# Patient Record
Sex: Male | Born: 1935 | Race: White | Hispanic: No | Marital: Married | Smoking: Never smoker
Health system: Southern US, Community
[De-identification: ages and names within clinical notes are randomized; demographics above are authoritative.]

## PROBLEM LIST (undated history)

## (undated) DIAGNOSIS — G4700 Insomnia, unspecified: Secondary | ICD-10-CM

## (undated) DIAGNOSIS — R55 Syncope and collapse: Secondary | ICD-10-CM

## (undated) DIAGNOSIS — I10 Essential (primary) hypertension: Secondary | ICD-10-CM

## (undated) DIAGNOSIS — I219 Acute myocardial infarction, unspecified: Secondary | ICD-10-CM

## (undated) DIAGNOSIS — R5383 Other fatigue: Secondary | ICD-10-CM

## (undated) DIAGNOSIS — F419 Anxiety disorder, unspecified: Secondary | ICD-10-CM

## (undated) DIAGNOSIS — G459 Transient cerebral ischemic attack, unspecified: Secondary | ICD-10-CM

## (undated) DIAGNOSIS — E871 Hypo-osmolality and hyponatremia: Secondary | ICD-10-CM

## (undated) DIAGNOSIS — I2510 Atherosclerotic heart disease of native coronary artery without angina pectoris: Secondary | ICD-10-CM

## (undated) DIAGNOSIS — R569 Unspecified convulsions: Secondary | ICD-10-CM

## (undated) DIAGNOSIS — I701 Atherosclerosis of renal artery: Secondary | ICD-10-CM

## (undated) DIAGNOSIS — I5020 Unspecified systolic (congestive) heart failure: Secondary | ICD-10-CM

## (undated) DIAGNOSIS — E785 Hyperlipidemia, unspecified: Secondary | ICD-10-CM

## (undated) DIAGNOSIS — Z9289 Personal history of other medical treatment: Secondary | ICD-10-CM

## (undated) HISTORY — DX: Hypo-osmolality and hyponatremia: E87.1

## (undated) HISTORY — DX: Hyperlipidemia, unspecified: E78.5

## (undated) HISTORY — DX: Atherosclerosis of renal artery: I70.1

## (undated) HISTORY — DX: Other fatigue: R53.83

## (undated) HISTORY — DX: Insomnia, unspecified: G47.00

## (undated) HISTORY — PX: CORONARY ARTERY BYPASS GRAFT: SHX141

## (undated) HISTORY — DX: Anxiety disorder, unspecified: F41.9

## (undated) HISTORY — DX: Personal history of other medical treatment: Z92.89

## (undated) HISTORY — DX: Acute myocardial infarction, unspecified: I21.9

## (undated) HISTORY — DX: Atherosclerotic heart disease of native coronary artery without angina pectoris: I25.10

## (undated) HISTORY — PX: POLYPECTOMY: SHX149

---

## 2001-12-20 DIAGNOSIS — I219 Acute myocardial infarction, unspecified: Secondary | ICD-10-CM

## 2001-12-20 HISTORY — PX: CARDIAC CATHETERIZATION: SHX172

## 2001-12-20 HISTORY — DX: Acute myocardial infarction, unspecified: I21.9

## 2002-04-25 ENCOUNTER — Encounter: Payer: Self-pay | Admitting: Emergency Medicine

## 2002-04-25 ENCOUNTER — Inpatient Hospital Stay (HOSPITAL_COMMUNITY): Admission: EM | Admit: 2002-04-25 | Discharge: 2002-04-28 | Payer: Self-pay | Admitting: Emergency Medicine

## 2002-04-26 ENCOUNTER — Encounter: Payer: Self-pay | Admitting: Internal Medicine

## 2002-04-27 ENCOUNTER — Encounter: Payer: Self-pay | Admitting: Internal Medicine

## 2002-05-16 ENCOUNTER — Encounter: Payer: Self-pay | Admitting: Cardiothoracic Surgery

## 2002-05-18 ENCOUNTER — Inpatient Hospital Stay (HOSPITAL_COMMUNITY): Admission: RE | Admit: 2002-05-18 | Discharge: 2002-05-23 | Payer: Self-pay | Admitting: Cardiothoracic Surgery

## 2002-05-18 ENCOUNTER — Encounter: Payer: Self-pay | Admitting: Cardiothoracic Surgery

## 2002-05-19 ENCOUNTER — Encounter: Payer: Self-pay | Admitting: Cardiothoracic Surgery

## 2002-05-20 ENCOUNTER — Encounter: Payer: Self-pay | Admitting: Thoracic Surgery (Cardiothoracic Vascular Surgery)

## 2002-05-20 ENCOUNTER — Encounter: Payer: Self-pay | Admitting: Cardiothoracic Surgery

## 2002-06-05 ENCOUNTER — Encounter (HOSPITAL_COMMUNITY): Admission: RE | Admit: 2002-06-05 | Discharge: 2002-09-03 | Payer: Self-pay | Admitting: Cardiology

## 2002-09-04 ENCOUNTER — Encounter (HOSPITAL_COMMUNITY): Admission: RE | Admit: 2002-09-04 | Discharge: 2002-12-03 | Payer: Self-pay | Admitting: Cardiology

## 2005-05-13 ENCOUNTER — Ambulatory Visit: Payer: Self-pay | Admitting: Internal Medicine

## 2006-04-11 ENCOUNTER — Ambulatory Visit: Payer: Self-pay | Admitting: Internal Medicine

## 2006-04-19 ENCOUNTER — Ambulatory Visit: Payer: Self-pay | Admitting: Internal Medicine

## 2006-11-05 ENCOUNTER — Ambulatory Visit: Payer: Self-pay | Admitting: Internal Medicine

## 2006-11-05 ENCOUNTER — Inpatient Hospital Stay (HOSPITAL_COMMUNITY): Admission: EM | Admit: 2006-11-05 | Discharge: 2006-11-07 | Payer: Self-pay | Admitting: Emergency Medicine

## 2006-11-11 ENCOUNTER — Ambulatory Visit: Payer: Self-pay | Admitting: Internal Medicine

## 2007-06-02 ENCOUNTER — Ambulatory Visit: Payer: Self-pay | Admitting: Internal Medicine

## 2007-06-02 DIAGNOSIS — F411 Generalized anxiety disorder: Secondary | ICD-10-CM | POA: Insufficient documentation

## 2007-06-02 DIAGNOSIS — E785 Hyperlipidemia, unspecified: Secondary | ICD-10-CM | POA: Insufficient documentation

## 2007-06-02 DIAGNOSIS — R5383 Other fatigue: Secondary | ICD-10-CM

## 2007-06-02 DIAGNOSIS — I2510 Atherosclerotic heart disease of native coronary artery without angina pectoris: Secondary | ICD-10-CM

## 2007-06-02 DIAGNOSIS — G4700 Insomnia, unspecified: Secondary | ICD-10-CM

## 2007-06-02 DIAGNOSIS — R5381 Other malaise: Secondary | ICD-10-CM

## 2007-06-05 LAB — CONVERTED CEMR LAB
BUN: 25 mg/dL — ABNORMAL HIGH (ref 6–23)
CO2: 22 meq/L (ref 19–32)
Creatinine, Ser: 1.12 mg/dL (ref 0.40–1.50)
Eosinophils Absolute: 0.3 10*3/uL (ref 0.0–0.7)
Eosinophils Relative: 6 % — ABNORMAL HIGH (ref 0–5)
Folate: 16.8 ng/mL
Glucose, Bld: 93 mg/dL (ref 70–99)
MCV: 93.3 fL (ref 78.0–100.0)
Neutro Abs: 3.7 10*3/uL (ref 1.7–7.7)
Platelets: 128 10*3/uL — ABNORMAL LOW (ref 150–400)
RBC: 4.91 M/uL (ref 4.22–5.81)
RDW: 14 % (ref 11.5–14.0)
Vitamin B-12: 348 pg/mL (ref 211–911)
WBC: 5.7 10*3/uL (ref 4.0–10.5)

## 2008-01-19 ENCOUNTER — Emergency Department (HOSPITAL_COMMUNITY): Admission: EM | Admit: 2008-01-19 | Discharge: 2008-01-19 | Payer: Self-pay | Admitting: Emergency Medicine

## 2008-01-19 ENCOUNTER — Encounter: Payer: Self-pay | Admitting: Internal Medicine

## 2008-01-22 ENCOUNTER — Telehealth (INDEPENDENT_AMBULATORY_CARE_PROVIDER_SITE_OTHER): Payer: Self-pay | Admitting: *Deleted

## 2008-01-23 ENCOUNTER — Ambulatory Visit: Payer: Self-pay | Admitting: Internal Medicine

## 2008-01-23 DIAGNOSIS — I10 Essential (primary) hypertension: Secondary | ICD-10-CM | POA: Insufficient documentation

## 2008-01-23 DIAGNOSIS — F329 Major depressive disorder, single episode, unspecified: Secondary | ICD-10-CM

## 2008-01-26 ENCOUNTER — Telehealth (INDEPENDENT_AMBULATORY_CARE_PROVIDER_SITE_OTHER): Payer: Self-pay | Admitting: *Deleted

## 2008-01-26 ENCOUNTER — Emergency Department (HOSPITAL_COMMUNITY): Admission: EM | Admit: 2008-01-26 | Discharge: 2008-01-26 | Payer: Self-pay | Admitting: Emergency Medicine

## 2008-01-27 LAB — CONVERTED CEMR LAB: Free T4: 0.6 ng/dL (ref 0.6–1.6)

## 2008-01-29 ENCOUNTER — Encounter (INDEPENDENT_AMBULATORY_CARE_PROVIDER_SITE_OTHER): Payer: Self-pay | Admitting: *Deleted

## 2008-01-30 ENCOUNTER — Encounter: Payer: Self-pay | Admitting: Internal Medicine

## 2008-01-31 ENCOUNTER — Ambulatory Visit: Payer: Self-pay | Admitting: Internal Medicine

## 2008-01-31 DIAGNOSIS — E782 Mixed hyperlipidemia: Secondary | ICD-10-CM

## 2008-02-06 ENCOUNTER — Encounter: Payer: Self-pay | Admitting: Internal Medicine

## 2008-02-08 ENCOUNTER — Encounter: Payer: Self-pay | Admitting: Internal Medicine

## 2008-02-12 ENCOUNTER — Encounter: Payer: Self-pay | Admitting: Internal Medicine

## 2008-03-05 ENCOUNTER — Encounter: Payer: Self-pay | Admitting: Internal Medicine

## 2008-04-15 ENCOUNTER — Encounter: Payer: Self-pay | Admitting: Internal Medicine

## 2008-05-16 ENCOUNTER — Encounter: Payer: Self-pay | Admitting: Internal Medicine

## 2009-09-01 ENCOUNTER — Encounter: Payer: Self-pay | Admitting: Internal Medicine

## 2009-09-04 ENCOUNTER — Ambulatory Visit: Payer: Self-pay | Admitting: Internal Medicine

## 2009-09-04 DIAGNOSIS — R42 Dizziness and giddiness: Secondary | ICD-10-CM

## 2009-09-04 DIAGNOSIS — I498 Other specified cardiac arrhythmias: Secondary | ICD-10-CM

## 2009-09-04 DIAGNOSIS — I951 Orthostatic hypotension: Secondary | ICD-10-CM

## 2009-09-06 ENCOUNTER — Telehealth: Payer: Self-pay | Admitting: Family Medicine

## 2009-09-10 ENCOUNTER — Telehealth: Payer: Self-pay | Admitting: Internal Medicine

## 2009-09-11 ENCOUNTER — Encounter: Payer: Self-pay | Admitting: Internal Medicine

## 2009-09-12 ENCOUNTER — Ambulatory Visit: Payer: Self-pay | Admitting: Cardiology

## 2009-09-12 ENCOUNTER — Encounter: Payer: Self-pay | Admitting: Cardiology

## 2009-09-12 ENCOUNTER — Inpatient Hospital Stay (HOSPITAL_COMMUNITY): Admission: EM | Admit: 2009-09-12 | Discharge: 2009-09-14 | Payer: Self-pay | Admitting: Emergency Medicine

## 2009-09-21 ENCOUNTER — Telehealth: Payer: Self-pay | Admitting: Internal Medicine

## 2009-09-22 ENCOUNTER — Encounter: Payer: Self-pay | Admitting: Cardiology

## 2009-09-22 ENCOUNTER — Ambulatory Visit: Payer: Self-pay

## 2009-09-24 ENCOUNTER — Ambulatory Visit: Payer: Self-pay

## 2009-09-24 ENCOUNTER — Ambulatory Visit: Payer: Self-pay | Admitting: Cardiology

## 2009-09-24 ENCOUNTER — Encounter: Payer: Self-pay | Admitting: Cardiology

## 2009-09-24 ENCOUNTER — Ambulatory Visit (HOSPITAL_COMMUNITY): Admission: RE | Admit: 2009-09-24 | Discharge: 2009-09-24 | Payer: Self-pay | Admitting: Cardiology

## 2009-09-24 DIAGNOSIS — R0602 Shortness of breath: Secondary | ICD-10-CM | POA: Insufficient documentation

## 2009-10-05 ENCOUNTER — Emergency Department (HOSPITAL_COMMUNITY): Admission: EM | Admit: 2009-10-05 | Discharge: 2009-10-05 | Payer: Self-pay | Admitting: Emergency Medicine

## 2009-10-07 ENCOUNTER — Ambulatory Visit: Payer: Self-pay

## 2009-10-07 ENCOUNTER — Ambulatory Visit: Payer: Self-pay | Admitting: Internal Medicine

## 2009-10-09 ENCOUNTER — Encounter: Payer: Self-pay | Admitting: Internal Medicine

## 2009-10-15 ENCOUNTER — Ambulatory Visit: Payer: Self-pay | Admitting: Internal Medicine

## 2009-11-01 ENCOUNTER — Ambulatory Visit: Payer: Self-pay | Admitting: Family Medicine

## 2009-11-01 ENCOUNTER — Telehealth: Payer: Self-pay | Admitting: Family Medicine

## 2009-11-01 DIAGNOSIS — I959 Hypotension, unspecified: Secondary | ICD-10-CM

## 2009-11-21 ENCOUNTER — Telehealth: Payer: Self-pay | Admitting: Internal Medicine

## 2009-11-24 ENCOUNTER — Encounter: Payer: Self-pay | Admitting: Cardiology

## 2009-11-25 ENCOUNTER — Ambulatory Visit: Payer: Self-pay

## 2009-11-25 ENCOUNTER — Ambulatory Visit: Payer: Self-pay | Admitting: Cardiology

## 2009-11-25 DIAGNOSIS — N259 Disorder resulting from impaired renal tubular function, unspecified: Secondary | ICD-10-CM | POA: Insufficient documentation

## 2009-11-25 DIAGNOSIS — I701 Atherosclerosis of renal artery: Secondary | ICD-10-CM

## 2009-11-26 ENCOUNTER — Ambulatory Visit: Payer: Self-pay | Admitting: Internal Medicine

## 2009-12-01 ENCOUNTER — Encounter: Payer: Self-pay | Admitting: Internal Medicine

## 2009-12-10 ENCOUNTER — Ambulatory Visit: Payer: Self-pay

## 2009-12-10 ENCOUNTER — Encounter: Payer: Self-pay | Admitting: Cardiovascular Disease

## 2009-12-16 ENCOUNTER — Telehealth: Payer: Self-pay | Admitting: Cardiology

## 2010-01-01 ENCOUNTER — Encounter: Payer: Self-pay | Admitting: Cardiology

## 2010-01-13 ENCOUNTER — Encounter (INDEPENDENT_AMBULATORY_CARE_PROVIDER_SITE_OTHER): Payer: Self-pay | Admitting: *Deleted

## 2010-01-19 ENCOUNTER — Encounter (INDEPENDENT_AMBULATORY_CARE_PROVIDER_SITE_OTHER): Payer: Self-pay

## 2010-01-19 ENCOUNTER — Ambulatory Visit: Payer: Self-pay | Admitting: Cardiovascular Disease

## 2010-01-19 LAB — CONVERTED CEMR LAB
Basophils Absolute: 0 10*3/uL (ref 0.0–0.1)
Calcium: 9.6 mg/dL (ref 8.4–10.5)
Chloride: 102 meq/L (ref 96–112)
Creatinine, Ser: 1 mg/dL (ref 0.4–1.5)
Eosinophils Relative: 5.4 % — ABNORMAL HIGH (ref 0.0–5.0)
GFR calc non Af Amer: 76.06 mL/min (ref >60)
Glucose, Bld: 95 mg/dL (ref 70–99)
Lymphocytes Relative: 11.8 % — ABNORMAL LOW (ref 12.0–46.0)
Lymphs Abs: 0.7 10*3/uL (ref 0.7–4.0)
MCHC: 32.8 g/dL (ref 30.0–36.0)
Monocytes Absolute: 0.8 10*3/uL (ref 0.1–1.0)
Prothrombin Time: 10.7 s (ref 9.1–11.7)

## 2010-01-21 ENCOUNTER — Ambulatory Visit: Payer: Self-pay | Admitting: Cardiovascular Disease

## 2010-01-21 ENCOUNTER — Ambulatory Visit (HOSPITAL_COMMUNITY): Admission: RE | Admit: 2010-01-21 | Discharge: 2010-01-21 | Payer: Self-pay | Admitting: Cardiovascular Disease

## 2010-01-28 HISTORY — PX: RENAL ARTERY STENT: SHX2321

## 2010-02-11 ENCOUNTER — Ambulatory Visit: Payer: Self-pay | Admitting: Internal Medicine

## 2010-02-23 ENCOUNTER — Encounter: Payer: Self-pay | Admitting: Cardiology

## 2010-02-23 LAB — CONVERTED CEMR LAB
Basophils Absolute: 0 10*3/uL (ref 0.0–0.1)
Basophils Relative: 0 % (ref 0.0–3.0)
Hemoglobin: 14.2 g/dL (ref 13.0–17.0)
Lymphs Abs: 0.5 10*3/uL — ABNORMAL LOW (ref 0.7–4.0)
Monocytes Absolute: 0.5 10*3/uL (ref 0.1–1.0)
Neutrophils Relative %: 52 % (ref 43.0–77.0)
WBC: 2.3 10*3/uL — ABNORMAL LOW (ref 4.5–10.5)

## 2010-02-25 ENCOUNTER — Ambulatory Visit: Payer: Self-pay | Admitting: Cardiology

## 2010-02-25 ENCOUNTER — Encounter: Payer: Self-pay | Admitting: Cardiology

## 2010-02-25 ENCOUNTER — Ambulatory Visit: Payer: Self-pay

## 2010-02-26 LAB — CONVERTED CEMR LAB
Chloride: 102 meq/L (ref 96–112)
Creatinine, Ser: 1 mg/dL (ref 0.4–1.5)
Glucose, Bld: 101 mg/dL — ABNORMAL HIGH (ref 70–99)
Potassium: 4.6 meq/L (ref 3.5–5.1)
Sodium: 134 meq/L — ABNORMAL LOW (ref 135–145)

## 2010-03-05 ENCOUNTER — Telehealth: Payer: Self-pay | Admitting: Cardiology

## 2010-03-10 ENCOUNTER — Telehealth (INDEPENDENT_AMBULATORY_CARE_PROVIDER_SITE_OTHER): Payer: Self-pay | Admitting: *Deleted

## 2010-03-11 ENCOUNTER — Encounter: Payer: Self-pay | Admitting: Internal Medicine

## 2010-05-04 ENCOUNTER — Telehealth (INDEPENDENT_AMBULATORY_CARE_PROVIDER_SITE_OTHER): Payer: Self-pay | Admitting: *Deleted

## 2010-07-06 ENCOUNTER — Ambulatory Visit: Payer: Self-pay | Admitting: Internal Medicine

## 2010-07-06 DIAGNOSIS — H9319 Tinnitus, unspecified ear: Secondary | ICD-10-CM | POA: Insufficient documentation

## 2010-07-06 LAB — CONVERTED CEMR LAB
Specific Gravity, Urine: 1.025
WBC Urine, dipstick: NEGATIVE

## 2010-07-15 ENCOUNTER — Telehealth (INDEPENDENT_AMBULATORY_CARE_PROVIDER_SITE_OTHER): Payer: Self-pay | Admitting: *Deleted

## 2010-07-15 LAB — CONVERTED CEMR LAB
ALT: 27 units/L (ref 0–53)
Albumin: 4 g/dL (ref 3.5–5.2)
BUN: 24 mg/dL — ABNORMAL HIGH (ref 6–23)
Basophils Absolute: 0 10*3/uL (ref 0.0–0.1)
Basophils Relative: 0.2 % (ref 0.0–3.0)
CO2: 30 meq/L (ref 19–32)
Chloride: 103 meq/L (ref 96–112)
Creatinine, Ser: 1.1 mg/dL (ref 0.4–1.5)
Eosinophils Absolute: 0.1 10*3/uL (ref 0.0–0.7)
Eosinophils Relative: 3.5 % (ref 0.0–5.0)
Lymphocytes Relative: 15.2 % (ref 12.0–46.0)
Monocytes Absolute: 0.6 10*3/uL (ref 0.1–1.0)
Monocytes Relative: 21 % — ABNORMAL HIGH (ref 3.0–12.0)
Platelets: 104 10*3/uL — ABNORMAL LOW (ref 150.0–400.0)
Potassium: 4 meq/L (ref 3.5–5.1)
RBC: 4.64 M/uL (ref 4.22–5.81)
Sodium: 137 meq/L (ref 135–145)
TSH: 1.37 microintl units/mL (ref 0.35–5.50)

## 2010-07-23 ENCOUNTER — Ambulatory Visit: Payer: Self-pay | Admitting: Internal Medicine

## 2010-07-29 LAB — CONVERTED CEMR LAB
Basophils Absolute: 0 10*3/uL (ref 0.0–0.1)
Basophils Relative: 0.4 % (ref 0.0–3.0)
Hemoglobin: 14.2 g/dL (ref 13.0–17.0)
Lymphs Abs: 0.6 10*3/uL — ABNORMAL LOW (ref 0.7–4.0)
MCV: 92.7 fL (ref 78.0–100.0)
Monocytes Absolute: 0.7 10*3/uL (ref 0.1–1.0)
Monocytes Relative: 15.1 % — ABNORMAL HIGH (ref 3.0–12.0)
Neutrophils Relative %: 66.2 % (ref 43.0–77.0)

## 2010-08-04 ENCOUNTER — Ambulatory Visit: Payer: Self-pay | Admitting: Internal Medicine

## 2010-08-04 DIAGNOSIS — D72819 Decreased white blood cell count, unspecified: Secondary | ICD-10-CM | POA: Insufficient documentation

## 2010-08-04 DIAGNOSIS — D696 Thrombocytopenia, unspecified: Secondary | ICD-10-CM | POA: Insufficient documentation

## 2010-08-26 ENCOUNTER — Encounter: Payer: Self-pay | Admitting: Cardiology

## 2010-08-27 ENCOUNTER — Ambulatory Visit: Payer: Self-pay

## 2010-08-27 ENCOUNTER — Encounter: Payer: Self-pay | Admitting: Cardiology

## 2010-10-26 ENCOUNTER — Encounter: Payer: Self-pay | Admitting: Internal Medicine

## 2010-12-22 ENCOUNTER — Ambulatory Visit
Admission: RE | Admit: 2010-12-22 | Discharge: 2010-12-22 | Payer: Self-pay | Source: Home / Self Care | Attending: Internal Medicine | Admitting: Internal Medicine

## 2010-12-22 DIAGNOSIS — Z87448 Personal history of other diseases of urinary system: Secondary | ICD-10-CM | POA: Insufficient documentation

## 2011-01-19 NOTE — Assessment & Plan Note (Signed)
Summary: cough x 1 month//lch   Vital Signs:  Patient profile:   77 year old male Weight:      156.2 pounds Temp:     98.4 degrees F oral Pulse rate:   76 / minute Resp:     16 per minute BP sitting:   110 / 62  (left arm) Cuff size:   large  Vitals Entered By: Shonna Chock (February 11, 2010 2:52 PM) CC: 1.) Cough x 1 Month  2.) Refill meds-local pharmacy, URI symptoms Comments REVIEWED MED LIST, PATIENT AGREED DOSE AND INSTRUCTION CORRECT    Primary Care Provider:  Marga Melnick MD  CC:  1.) Cough x 1 Month  2.) Refill meds-local pharmacy and URI symptoms.  History of Present Illness:  URI Symptoms      This is an 77 year old man who presents with URI symptoms over past 6 weeks. Renal artery stenting 01/28/2010; Plavix initiated for 30 days by Dr Excell Seltzer. The patient reports nasal congestion, clear nasal discharge, productive cough with thick & clear secretions, and sick contacts (granddaughter), but denies purulent nasal discharge, sore throat, and earache.  Associated symptoms include wheezing in throat @ night.  The patient denies fever, stiff neck, dyspnea, rash, vomiting, diarrhea, and use of an antipyretic.  The patient also reports itchy watery eyes, sneezing,frontal  headache, muscle aches, and severe fatigue.  The patient denies itchy throat and seasonal symptoms.  Risk factors for Strep sinusitis include double sickening.  The patient denies the following risk factors for Strep sinusitis: unilateral facial pain, unilateral nasal discharge, tooth pain, Strep exposure, and tender adenopathy. Rx: Vicks intranasally   Allergies: 1)  ! * Niaspan  Past History:  Past Surgical History: Coronary artery bypass graft Colon polypectomy remotely, seen q 5 yrs Renal Artery Stenting 01/28/2010   Review of Systems General:  Complains of chills and fatigue; denies fever, sweats, and weight loss. Eyes:  Denies discharge, eye pain, red eye, and vision loss-both eyes. ENT:  Denies  ear discharge. Resp:  Denies chest pain with inspiration and coughing up blood; No PMH of asthma; never smoked. Allergy:  Denies hives or rash; Benadryl helped nasal drainage.  Physical Exam  General:  Appears fatigued but in no acute distress;  affect flat but  cooperative throughout examination Ears:  External ear exam shows no significant lesions or deformities.  Otoscopic examination reveals clear canals, tympanic membranes are intact bilaterally without bulging, retraction, inflammation or discharge. Hearing is grossly normal bilaterally. Nose:  External nasal examination shows no deformity or inflammation. Nasal mucosa are pink and moist without lesions or exudates. Mouth:  Oral mucosa and oropharynx without lesions or exudates.  Teeth in good repair. Lungs:  Normal respiratory effort, chest expands symmetrically. Lungs are clear to auscultation, no crackles or wheezes. Dry cough Heart:  Normal rate and regular rhythm. S1 and S2 normal without gallop, murmur, click, rub. S4 Extremities:  No clubbing, cyanosis,  deformity noted. No significant edema(trace of sock elastic noted) Skin:  Intact without suspicious lesions or rashes Cervical Nodes:  No lymphadenopathy noted Axillary Nodes:  No palpable lymphadenopathy Psych:  flat affect, subdued, and withdrawn.     Impression & Recommendations:  Problem # 1:  URI (ICD-465.9)  doubt bacterial infection; probable extrinsic His updated medication list for this problem includes:    Aspirin 81 Mg Tabs (Aspirin) .Marland Kitchen... Daily    Loratadine 10 Mg Tabs (Loratadine) .Marland Kitchen... 1 once daily as needed allergies  Orders: Venipuncture (51884) TLB-CBC Platelet -  w/Differential (85025-CBCD)  Problem # 2:  RHINITIS (ICD-477.9)  Orders: Venipuncture (16109) TLB-CBC Platelet - w/Differential (85025-CBCD)  His updated medication list for this problem includes:    Loratadine 10 Mg Tabs (Loratadine) .Marland Kitchen... 1 once daily as needed allergies  Problem # 3:   FATIGUE (ICD-780.79)  Problem # 4:  RENAL ARTERY STENOSIS (ICD-440.1) S/P stenting 01/28/2010  Complete Medication List: 1)  Fish Oil 1000 Mg Caps (Omega-3 fatty acids) .... 3x a week 2)  Alprazolam 0.5 Mg Tabs (Alprazolam) .... Half to one tablet p.o. t.i.d. p.r.n. anxiety.  1  to 1.5 tablet nightly p.r.n. insomnia. 3)  Nitroglycerin 0.4 Mg Subl (Nitroglycerin) .Marland Kitchen.. 1 by mouth under tongue as needed for chest pain - can repeat in 5 minutes er if no better 4)  Crestor 40 Mg Tabs (Rosuvastatin calcium) .... 1/2 tablet daily 5)  Zetia 10 Mg Tabs (Ezetimibe) .... 1/2 tablet daily 6)  Aspirin 81 Mg Tabs (Aspirin) .... Daily 7)  Coreg 3.125 Mg Tabs (Carvedilol) .... Take 1 tablet by mouth once a day 8)  Furosemide 40 Mg Tabs (Furosemide) .... 1/2 a tablet as needed for fluid retention 9)  Lisinopril 10 Mg Tabs (Lisinopril) .... Daily 10)  K-tabs 10 Meq Cr-tabs (Potassium chloride) .... 1/2 tablet once daily 11)  Sertraline Hcl 25 Mg Tabs (Sertraline hcl) .... 1/2 tab once daily 12)  Plavix 75 Mg Tabs (Clopidogrel bisulfate) .Marland Kitchen.. 1 by mouth once daily x 30 days. will d/c 02/25/2010 13)  Loratadine 10 Mg Tabs (Loratadine) .Marland Kitchen.. 1 once daily as needed allergies  Patient Instructions: 1)  Neti pot once daily as needed for head congestion. Singulair 10 mg @ bedtime ; Nasonex 1 spray two times a day  into nostrils as needed for any head congestion. CT scan of sinuses if no better . Prescriptions: LORATADINE 10 MG TABS (LORATADINE) 1 once daily as needed allergies  #30 x 11   Entered and Authorized by:   Marga Melnick MD   Signed by:   Marga Melnick MD on 02/11/2010   Method used:   Faxed to ...       [facility]  [address] # [id number]* (retail)       [address]       [place], [place]  [address]       Ph: [phone] or [phone]       Fax: [phone]   RxID:   [id number]

## 2011-01-19 NOTE — Letter (Signed)
Summary: Peripheral Vascular  [facility], Main Office  [address]   [place], [place] [address]   Phone: [phone]  Fax: [phone]     01/19/2010 MRN: [id number]  RANDALL COLDEN [address] [place], [place]  [address]  Dear Mr. FATZINGER,   You are scheduled for Peripheral Vascular Angiogram and Cardiac Catheterization on Wednesday January 21, 2010 with Dr. Excell Seltzer.  Please arrive at the [facility] at 6:00       a.m. on the day of your procedure.  1. DIET     _X___ Nothing to eat or drink after midnight except your medications with a sip of water.  2. MAKE SURE YOU TAKE YOUR ASPIRIN.  3. _X____ DO NOT TAKE these medications before your procedure:      DO NOT TAKE LISINOPRIL STARTING 01/19/10.  DO NOT TAKE FUROSEMIDE THE MORNING OF PROCEDURE.         _X___ YOU MAY TAKE ALL of your remaining medications with a small amount of water.       4. Plan for one night stay - bring personal belongings (i.e. toothpaste, toothbrush, etc.)  5. Bring a current list of your medications and current insurance cards.  6. Must have a responsible person to drive you home.   7. Someone must be with you for the first 24 hours after you arrive home.  8. Please wear clothes that are easy to get on and off and wear slip-on shoes.  *Special note: Every effort is made to have your procedure done on time.  Occasionally there are emergencies that present themselves at the hospital that may cause delays.  Please be patient if a delay does occur.  If you have any questions after you get home, please call the office at the number listed above.   Julieta Gutting, RN, BSN

## 2011-01-19 NOTE — Miscellaneous (Signed)
Summary: Orders Update  Clinical Lists Changes  Orders: Added new Test order of Renal Artery Duplex (Renal Artery Duplex) - Signed 

## 2011-01-19 NOTE — Miscellaneous (Signed)
Summary: Flu/[facility]  Flu/[facility]   Imported By: Lanelle Bal 11/05/2010 13:42:42  _____________________________________________________________________  External Attachment:    Type:   Image     Comment:   External Document

## 2011-01-19 NOTE — Assessment & Plan Note (Signed)
Summary: dizzy,ringing in ear/cbs   Vital Signs:  Patient profile:   77 year old male Weight:      153.50 pounds Temp:     97.0 degrees F oral Pulse rate:   98 / minute Pulse rhythm:   regular BP sitting:   130 / 76  (left arm) Cuff size:   large  Vitals Entered By: Army Fossa CMA (July 06, 2010 2:23 PM) CC: Pt here c/o dizzy and weak. Comments Had temp last night of 100.5 Congested.   History of Present Illness: here with his daughter  Complaining of weakness for the last 2 or 3 days, had fever last night he has cough with occasional sputum without hemoptysis. Cough is at baseline He also feels dizzy at times when he stands up but that symptom is at baseline as well not taking any new medications except a homeopathic "tinnitus medicine"  His other complaint today is tinnitus  which is chronic and getting worse some decreased hearing  ROS -No chest pain or palpitations "I'm doing too much yard work" for the last few months he has worked in the yard daily   admits to  [employer] when doing things inside his house x the last 3 days ( but not when he does yard work) -no nausea, vomiting, diarrhea, blood in the stools or abdominal pain -no dysuria or hematuria -no slurred speech or motor deficits -additionally, the daughter reports increased problems with memory, has gotten lost twice in familiar places    Current Medications (verified): 1)  Nitroglycerin 0.4 Mg  Subl (Nitroglycerin) .Marland Kitchen.. 1 By Mouth Under Tongue As Needed For Chest Pain - Can Repeat in 5 Minutes Er If No Better 2)  Crestor 40 Mg Tabs (Rosuvastatin Calcium) .... 1/2 Tablet Daily 3)  Zetia 10 Mg Tabs (Ezetimibe) .... 1/2 Tablet Daily 4)  Coreg 3.125 Mg Tabs (Carvedilol) .... Take 1 Tablet By Mouth Once A Day 5)  Furosemide 40 Mg Tabs (Furosemide) .... 1/2 A Tablet As Needed For Fluid Retention 6)  Lisinopril 10 Mg Tabs (Lisinopril) .... Daily 7)  K-Tabs 10 Meq Cr-Tabs (Potassium Chloride) .... 1/2 Tablet Once  Daily 8)  Sertraline Hcl 25 Mg Tabs (Sertraline Hcl) .... 1/2 Tab Once Daily 9)  Tamsulosin Hcl 0.4 Mg Caps (Tamsulosin Hcl)  Allergies: 1)  ! * Niaspan  Past History:  Past Medical History: Reviewed history from 01/19/2010 and no changes required. Coronary artery disease  a.     Myocardial infarction in 2003.       b.     Status post cardiac catheterization in 2003 revealing severe        three-vessel disease.       c.     Status post coronary artery bypass grafting, LIMA to LAD,        SVG to diagonal, SVG to RCA, and SVG to circumflex per Dr.        Tyrone Sage in May 2003.  Hyperlipidemia Anxiety Fatigue Insomnia Renal artery stenosis - bilateral  Past Surgical History: Reviewed history from 02/11/2010 and no changes required. Coronary artery bypass graft Colon polypectomy remotely, seen q 5 yrs Renal Artery Stenting 01/28/2010   Social History: Never Smoked ETOH--no Married lives w/ wife and a a 2 y/o GGd  Review of Systems      See HPI  Physical Exam  General:  alert, well-developed, and well-nourished.   Head:  face symetric Ears:  R ear normal and L ear normal.   Nose:  not  congested Mouth:  no red or d/c  Neck:  supple and full ROM.   Lungs:  normal respiratory effort, no intercostal retractions, no accessory muscle use, and normal breath sounds.   Heart:  normal rate, regular rhythm, and no murmur.   Abdomen:  soft, non-tender, no distention, no masses, no guarding, and no rigidity.   Extremities:  no edema Neurologic:  strength normal in all extremities and gait normal.   Psych:  normally interactive, good eye contact, not anxious appearing, and not depressed appearing.     Impression & Recommendations:  Problem # 1:  FATIGUE, ACUTE (ICD-780.79) his chief complaint today is fatigue for the last 2 or 3 days he also reports fever x one apparently he is also doing outdoors yard  work (the days lately have  been very hot) The review of systems shows  that otherwise  he is basically at baseline etiology of the fatigue is not  clear Plan Avoid working outdoors during the hot weather Labs Chest x-ray see instructions  Orders: Venipuncture (16109) TLB-BMP (Basic Metabolic Panel-BMET) (80048-METABOL) TLB-CBC Platelet - w/Differential (85025-CBCD) TLB-TSH (Thyroid Stimulating Hormone) (84443-TSH) TLB-Hepatic/Liver Function Pnl (80076-HEPATIC) T-2 View CXR (71020TC) Specimen Handling ([address]) UA Dipstick w/o Micro (automated)  (81003)  Problem # 2:  TINNITUS, CHRONIC (ICD-388.30)  this is  of a lot of concern to the patient Has been taking homeopathic medication without relief Plan- d/c homeopatic med  refer to ENT  Orders: ENT Referral (ENT)  Problem # 3:  dementia recommend to be reassessed at another visit w/ PCP  Complete Medication List: 1)  Nitroglycerin 0.4 Mg Subl (Nitroglycerin) .Marland Kitchen.. 1 by mouth under tongue as needed for chest pain - can repeat in 5 minutes er if no better 2)  Crestor 40 Mg Tabs (Rosuvastatin calcium) .... 1/2 tablet daily 3)  Zetia 10 Mg Tabs (Ezetimibe) .... 1/2 tablet daily 4)  Coreg 3.125 Mg Tabs (Carvedilol) .... Take 1 tablet by mouth once a day 5)  Furosemide 40 Mg Tabs (Furosemide) .... 1/2 a tablet as needed for fluid retention 6)  Lisinopril 10 Mg Tabs (Lisinopril) .... Daily 7)  K-tabs 10 Meq Cr-tabs (Potassium chloride) .... 1/2 tablet once daily 8)  Sertraline Hcl 25 Mg Tabs (Sertraline hcl) .... 1/2 tab once daily 9)  Tamsulosin Hcl 0.4 Mg Caps (Tamsulosin hcl)   Patient Instructions: 1)  reast and take  lots of fluids 2)  Avoid working outside for a few weeks 3)  go to the ER if he has severe symptoms, high fever, chest pain, more difficulty breathing 4)  Come back again in 4 weeks for a recheck  Laboratory Results   Urine Tests    Routine Urinalysis   Color: straw Appearance: Clear Glucose: negative   (Normal Range: Negative) Bilirubin: negative   (Normal Range:  Negative) Ketone: negative   (Normal Range: Negative) Spec. Gravity: 1.025   (Normal Range: 1.003-1.035) Blood: negative   (Normal Range: Negative) pH: 6.5   (Normal Range: 5.0-8.0) Protein: negative   (Normal Range: Negative) Urobilinogen: 0.2   (Normal Range: 0-1) Nitrite: negative   (Normal Range: Negative) Leukocyte Esterace: negative   (Normal Range: Negative)    Comments: Army Fossa CMA  July 06, 2010 3:38 PM

## 2011-01-19 NOTE — Progress Notes (Signed)
Summary: refill  Phone Note Refill Request Message from:  Patient  Refills Requested: Medication #1:  ALPRAZOLAM 0.5 MG  TABS half to one tablet p.o. t.i.d. p.r.n. anxiety.  1  to 1.5 tablet nightly p.r.n. insomnia. [facility] [address]   Method Requested: Fax to Local Pharmacy Initial call taken by: Okey Regal [place],  May 04, 2010 12:01 PM    Prescriptions: ALPRAZOLAM 0.5 MG  TABS (ALPRAZOLAM) half to one tablet p.o. t.i.d. p.r.n. anxiety.  1  to 1.5 tablet nightly p.r.n. insomnia.  #45 x 0   Entered by:   Shonna Chock   Authorized by:   Marga Melnick MD   Signed by:   Shonna Chock on 05/04/2010   Method used:   Printed then faxed to ...       [facility]  [address] # [id number]* (retail)       [address]       [place], [place]  [address]       Ph: [phone] or [phone]       Fax: [phone]   RxID:   [id number]

## 2011-01-19 NOTE — Progress Notes (Signed)
Summary: chest pain/blacked out  Phone Note Call from Patient Call back at Home Phone [phone]   Caller: Spouse Reason for Call: Privacy/Consent Authorization Summary of Call: chest pain, took 1 nitro,started getting a headache went to the br and  blacked out, BP 97/46 hr 73 Initial call taken by: Migdalia Dk,  March 05, 2010 3:24 PM  Follow-up for Phone Call        sitting on porch and had chest pain, (had picked up something heavy today) took SL Ntg 0.4 X 1 and laid down for a few minutes,  had to go to the br, went to the br, stood up to put his pants back on and "passed out"  wife not sure if pt really passed out or if he  just closed his eyes, she was with him and helped him to the floor,  BP's 101/47 97/46   hr 73  now laying down and is not having chest pain or feeling dizzy. Encoaraged wife to make sure to have the pt change positions slowly, drink plenty of fluids and call back if further problems.  Wife states understanding Follow-up by: Charolotte Capuchin, RN,  March 05, 2010 3:54 PM

## 2011-01-19 NOTE — Miscellaneous (Signed)
  Clinical Lists Changes  Observations: Added new observation of RENAL USOUND: Normal caliber abdominal aorta, with atherosclerosis throughout Normal kidney size bilaterally >60% bilateral renal artery stenosis, right more significant than left  Results discussed with Dr. Antoine Poche. (12/10/2009 11:00)      Renal US  Procedure date:  12/10/2009  Findings:      Normal caliber abdominal aorta, with atherosclerosis throughout Normal kidney size bilaterally >60% bilateral renal artery stenosis, right more significant than left  Results discussed with Dr. Antoine Poche.

## 2011-01-19 NOTE — Progress Notes (Signed)
Summary: check on pt  Phone Note Outgoing Call   Call placed by: Doristine Devoid CMA,  July 15, 2010 9:42 AM Call placed to: Patient Summary of Call: please call patient was seen w/ acute fatigue, better ?  also , CBC abnormal for the second time, repeat CBC in 1 week, if no better  ----> will need referal to hematology   Follow-up for Phone Call        spoke w/ patient aware of labs and says he is feeling much better also appt scheduled to recheck cbcd........Marland KitchenDoristine Devoid CMA  July 15, 2010 9:45 AM

## 2011-01-19 NOTE — Progress Notes (Signed)
Summary: TAKE ENSURE??, HAS APPT WITH DOCTOR AT [facility]  Phone Note Call from Patient Call back at Home Phone [phone]   Caller: Patient Summary of Call: PATIENT NEEDS TO ASK DR HOPPER IF HE CAN TAKE A PRODUCT THAT HE CALLED "LIQUID VITAMINS CALLED ENSURE"  SAYS HE FEELS SO WEAK---SAYS HE "NEEDS TO BUILD HIS BLOOD UP, SAID HIS BLOOD COUNT IS WAY DOWN"  WANTS DR HOPPER TO KNOW THAT [facility]--ON WEDNESDAY, 03/11/2010 AT 10:30 AM AND WILL BE ASKING THAT DOCTOR TO CHECK HIS BLOOD COUNT Initial call taken by: Jerolyn Shin,  March 10, 2010 2:18 PM  Follow-up for Phone Call        Ensure or Boost daily is appropriate. Please take copies of labs done here to [facility] for review Follow-up by: Marga Melnick MD,  March 10, 2010 6:12 PM  Additional Follow-up for Phone Call Additional follow up Details #1::        pt aware.................Marland KitchenFelecia Deloach CMA  March 11, 2010 8:53 AM

## 2011-01-19 NOTE — Miscellaneous (Signed)
Summary: Orders Update  Clinical Lists Changes  Orders: Added new Referral order of Misc. Referral (Misc. Ref) - Signed 

## 2011-01-19 NOTE — Assessment & Plan Note (Signed)
Summary: RETURN VISIT 4 WEEKS///SPH   Vital Signs:  Patient profile:   77 year old male Height:      64 inches (162.56 cm) Weight:      154.38 pounds (70.17 kg) BMI:     26.60 O2 Sat:      97 % on Room air Temp:     98.4 degrees F (36.89 degrees C) oral Pulse rate:   74 / minute Resp:     17 per minute BP sitting:   118 / 68  (left arm) Cuff size:   large  Vitals Entered By: Brenton Grills MA (August 04, 2010 3:11 PM)  O2 Flow:  Room air CC:  4 wk Return Office Visit/Copy of Labs given/aj, URI symptoms   Primary Care Provider:  Marga Melnick MD  CC:   4 wk Return Office Visit/Copy of Labs given/aj and URI symptoms.  History of Present Illness: Dizziness has resolved avoiding yardwork in recent heat. Serial labs reviewed : WBC up from 2.8 to 4.4 & platelet up from 104,000  to 130,000. Monocyte has decreased from 21% to 15.1%. An atypical infectious process suggested. He now  has  active  URI & bronchitic symptoms.  The patient reports nasal congestion and productive cough  , but denies purulent nasal discharge.  Associated symptoms include wheezing.  The patient denies fever and dyspnea.  The patient also reports frontal  headache w/o facial pain.    Current Medications (verified): 1)  Nitroglycerin 0.4 Mg  Subl (Nitroglycerin) .Marland Kitchen.. 1 By Mouth Under Tongue As Needed For Chest Pain - Can Repeat in 5 Minutes Er If No Better 2)  Crestor 40 Mg Tabs (Rosuvastatin Calcium) .... 1/2 Tablet Daily 3)  Zetia 10 Mg Tabs (Ezetimibe) .... 1/2 Tablet Daily 4)  Coreg 3.125 Mg Tabs (Carvedilol) .... Take 1 Tablet By Mouth Once A Day 5)  Furosemide 40 Mg Tabs (Furosemide) .... 1/2 A Tablet As Needed For Fluid Retention 6)  Lisinopril 10 Mg Tabs (Lisinopril) .... Daily 7)  K-Tabs 10 Meq Cr-Tabs (Potassium Chloride) .... 1/2 Tablet Once Daily 8)  Sertraline Hcl 25 Mg Tabs (Sertraline Hcl) .... 1/2 Tab Once Daily 9)  Tamsulosin Hcl 0.4 Mg Caps (Tamsulosin Hcl)  Allergies (verified): 1)  ! *  Niaspan  Physical Exam  General:  Flat affect,in no acute distress; alert,appropriate and cooperative throughout examination Ears:  External ear exam shows no significant lesions or deformities.  Otoscopic examination reveals clear canals, tympanic membranes are intact bilaterally without bulging, retraction, inflammation or discharge. Hearing is grossly normal bilaterally. Nose:  External nasal examination shows no deformity or inflammation. Nasal mucosa are pink and moist without lesions or exudates. Mouth:  Oral mucosa and oropharynx without lesions or exudates.  Teeth in good repair. Lungs:  Normal respiratory effort, chest expands symmetrically. Lungs are clear to auscultation, no crackles or wheezes. Cervical Nodes:  No lymphadenopathy noted Axillary Nodes:  No palpable lymphadenopathy   Impression & Recommendations:  Problem # 1:  URI (ICD-465.9)  Problem # 2:  BRONCHITIS-ACUTE (ICD-466.0)  His updated medication list for this problem includes:    Doxycycline Hyclate 100 Mg Caps (Doxycycline hyclate) .Marland Kitchen... 1 two times a day x 2 days then once daily ; avoid sun while onthis  Problem # 3:  LEUKOCYTOPENIA UNSPECIFIED (ICD-288.50) resolving  Problem # 4:  UNSPECIFIED THROMBOCYTOPENIA (ICD-287.5) resolving  Complete Medication List: 1)  Nitroglycerin 0.4 Mg Subl (Nitroglycerin) .Marland Kitchen.. 1 by mouth under tongue as needed for chest pain - can repeat  in 5 minutes er if no better 2)  Crestor 40 Mg Tabs (Rosuvastatin calcium) .... 1/2 tablet daily 3)  Zetia 10 Mg Tabs (Ezetimibe) .... 1/2 tablet daily 4)  Coreg 3.125 Mg Tabs (Carvedilol) .... Take 1 tablet by mouth once a day 5)  Furosemide 40 Mg Tabs (Furosemide) .... 1/2 a tablet as needed for fluid retention 6)  Lisinopril 10 Mg Tabs (Lisinopril) .... Daily 7)  K-tabs 10 Meq Cr-tabs (Potassium chloride) .... 1/2 tablet once daily 8)  Sertraline Hcl 25 Mg Tabs (Sertraline hcl) .... 1/2 tab once daily 9)  Tamsulosin Hcl 0.4 Mg Caps  (Tamsulosin hcl) 10)  Doxycycline Hyclate 100 Mg Caps (Doxycycline hyclate) .Marland Kitchen.. 1 two times a day x 2 days then once daily ; avoid sun while onthis  Patient Instructions: 1)  Take Echinacea  & vitamin C 2000 mg once daily X 5-7 days. If no better over 24- 48  hrs ; fill Rx for antibiotics. Drink Gatorade Lite as needed to replete electrolytes  loss from heat. Prescriptions: DOXYCYCLINE HYCLATE 100 MG CAPS (DOXYCYCLINE HYCLATE) 1 two times a day X 2 days then once daily ; avoid sun while onthis  #12 x 0   Entered and Authorized by:   Marga Melnick MD   Signed by:   Marga Melnick MD on 08/04/2010   Method used:   Print then Give to Patient   RxID:   [id number]

## 2011-01-19 NOTE — Assessment & Plan Note (Signed)
Summary: NPV   Visit Type:  Initial Consult Primary Provider:  Marga Melnick MD  CC:  Per Dr. Iona Coach Renal Artery stenosis. Pt. had chest pains last friday released with nitro an aspirin.  History of Present Illness: 77 year-old male referred for initial evaluation of renal artery stenosis. He was seen by Dr Antoine Poche last month and a renal duplex was ordered for evaluation of labile HTN. This demonstrated severe bilateral renal artery stenosis, right worse than left. The pt is referred for further evaluation.  He has had problems with hypotension and bradycardia a few months ago, but this seems to have resolved. He has discontinued Flomax and he has had less hypotension since then. Reports systolic readings at home are generally in the 150-160's and diastolic readings are 70-80's.   He had chest pain last week after overexerting. Describes sharp, left-sided pain that resolved with a NTG. He also complains of generalized fatigue and exertional dyspnea since the Fall. He has to rest several times with raking leaves and did not have to do this in the past. Chest pain episode last week felt similar to symptoms preceding his CABG surgery. He and his wife are very concerned about this.    Current Medications (verified): 1)  Fish Oil 1000 Mg  Caps (Omega-3 Fatty Acids) .... 3x A Week 2)  Alprazolam 0.5 Mg  Tabs (Alprazolam) .... Half To One Tablet P.o. T.i.d. P.r.n. Anxiety.  1  To 1.5 Tablet Nightly P.r.n. Insomnia. 3)  Nitroglycerin 0.4 Mg  Subl (Nitroglycerin) .Marland Kitchen.. 1 By Mouth Under Tongue As Needed For Chest Pain - Can Repeat in 5 Minutes Er If No Better 4)  Crestor 40 Mg Tabs (Rosuvastatin Calcium) .... 1/2 Tablet Daily 5)  Zetia 10 Mg Tabs (Ezetimibe) .... 1/2 Tablet Daily 6)  Aspirin 81 Mg  Tabs (Aspirin) .... Daily 7)  Coreg 3.125 Mg Tabs (Carvedilol) .... Take 1 Tablet By Mouth Once A Day 8)  Furosemide 40 Mg Tabs (Furosemide) .... 1/2 A Tablet As Needed For Fluid Retention 9)   Lisinopril 10 Mg Tabs (Lisinopril) .... Daily 10)  K-Tabs 10 Meq Cr-Tabs (Potassium Chloride) .... 1/2 Tablet Once Daily 11)  Sertraline Hcl 25 Mg Tabs (Sertraline Hcl) .... 1/2 Tab Once Daily  Allergies: 1)  ! * Niaspan  Past History:  Past medical, surgical, family and social histories (including risk factors) reviewed, and no changes noted (except as noted below).  Past Medical History: Coronary artery disease  a.     Myocardial infarction in 2003.       b.     Status post cardiac catheterization in 2003 revealing severe        three-vessel disease.       c.     Status post coronary artery bypass grafting, LIMA to LAD,        SVG to diagonal, SVG to RCA, and SVG to circumflex per Dr.        Tyrone Sage in May 2003.  Hyperlipidemia Anxiety Fatigue Insomnia Renal artery stenosis - bilateral  Past Surgical History: Reviewed history from 09/23/2009 and no changes required. Coronary artery bypass graft Colon polypectomy remotely, seen q 5 yrs  Family History: Reviewed history from 01/31/2008 and no changes required. mother diabetes, CVA father MI died age 36 paternal grandfather colon cancer  Social History: Reviewed history from 10/15/2009 and no changes required. Never Smoked Married  Review of Systems       Negative except as per HPI   Vital Signs:  Patient  profile:   77 year old male Height:      64 inches Weight:      154.75 pounds BMI:     26.66 Pulse rate:   75 / minute Pulse rhythm:   regular Resp:     18 per minute BP sitting:   122 / 74  (left arm) Cuff size:   regular  Vitals Entered By: Vikki Ports (January 19, 2010 11:39 AM)  Serial Vital Signs/Assessments:  Time      Position  BP       Pulse  Resp  Temp     By                     122/70                         Vikki Ports   Physical Exam  General:  Pt is well-developed, alert and oriented, very pleasant, elderly male, in no acute distress HEENT: normal Neck: no thyromegaly            JVP normal, carotid upstrokes normal without bruits Lungs: CTA Chest: equal expansion  CV: Apical impulse nondisplaced, RRR without murmur or gallop Abd: soft, NT, positive BS, no HSM, no bruit Back: no CVA tenderness Ext: no clubbing, cyanosis, or edema        femoral pulses 2+ without bruits        pedal pulses 2+ and equal Skin: feet are cool with cyanotic toes, but pulses are intact. Neuro: CNII-XII intact,strength 5/5 = b/l    Arterial Doppler  Procedure date:  12/10/2009  Findings:      Renal Doppler: Right renal origin - peak velocity 468 cm/s, RAR 5.3; left renal origin - 308, RAR 3.5. Right kidney 9 cm, left kidney 10.3 cm.  EKG  Procedure date:  01/19/2010  Findings:      NSR, HR 75 bpm, within normal limits.  Impression & Recommendations:  Problem # 1:  RENAL ARTERY STENOSIS (ICD-440.1) Renal duplex reviewed. With severe bilateral renal artery stenosis, recommend renal angio and possible PTA. The right renal velocities are extremely elevated and that kidney is smaller than the left, suggesting hemodynamically significant renal stenosis. Risks and indications of renal angio/PTA reviewed with the pt and his wife in detail and they agree to proceed.  Orders: EKG w/ Interpretation (93000) TLB-BMP (Basic Metabolic Panel-BMET) (80048-METABOL) TLB-CBC Platelet - w/Differential (85025-CBCD) TLB-PT (Protime) (85610-PTP) Cardiac Catheterization (Cardiac Cath) PV Procedure (PV Procedure)  Problem # 2:  C A D (ICD-414.00) The pt is s/p CABG. He reports recent episode of exertional and post-exertional chest pain, with similar characteristics to prior angina. Favor cardiac cath for definitive evaluation, expecially since the pt will be undergoing renal angio. Reviewed with his primary cardiologist, Dr Antoine Poche, who agrees with this plan. In the meantime, continue ASA, coreg, lisinopril. Risks/indications of cath reviewed with pt and he agrees to proceed.  Will try to do cardiac  cath, renal angio and PTA in one setting if contrast load allows. Check creatinine prior to procedure, hold ACE 48 hrs before procedure, and will bring pt in 4 hrs ahead of time for hydration.  His updated medication list for this problem includes:    Nitroglycerin 0.4 Mg Subl (Nitroglycerin) .Marland Kitchen... 1 by mouth under tongue as needed for chest pain - can repeat in 5 minutes er if no better    Aspirin 81 Mg Tabs (Aspirin) .Marland Kitchen... Daily    Coreg  3.125 Mg Tabs (Carvedilol) .Marland Kitchen... Take 1 tablet by mouth once a day    Lisinopril 10 Mg Tabs (Lisinopril) .Marland Kitchen... Daily  Orders: EKG w/ Interpretation (93000) TLB-BMP (Basic Metabolic Panel-BMET) (80048-METABOL) TLB-CBC Platelet - w/Differential (85025-CBCD) TLB-PT (Protime) (85610-PTP) Cardiac Catheterization (Cardiac Cath) PV Procedure (PV Procedure)  Patient Instructions: 1)  Your physician has requested that you have a cardiac catheterization.  Cardiac catheterization is used to diagnose and/or treat various heart conditions. Doctors may recommend this procedure for a number of different reasons. The most common reason is to evaluate chest pain. Chest pain can be a symptom of coronary artery disease (CAD), and cardiac catheterization can show whether plaque is narrowing or blocking your heart's arteries. This procedure is also used to evaluate the valves, as well as measure the blood flow and oxygen levels in different parts of your heart.  For further information please visit [email or web address]  Please follow instruction sheet, as given. 2)  Your physician has requested that you have a peripheral vascular angiogram. This exam is performed at the hospital. During this exam IV contrast is used to look at arterial blood flow.  Please review the information sheet given for details.

## 2011-01-19 NOTE — Miscellaneous (Signed)
  Clinical Lists Changes  Observations: Added new observation of CARDCATHFIND: 1. Severe native three-vessel coronary artery disease. 2. Status post coronary artery bypass graft with 4/4 patent grafts. 3. Severe right renal artery stenosis with successful primary     stenting.   RECOMMENDATIONS:  The patient should receive 30 days of Plavix.  He has patent bypass grafts that can be treated medically for his coronary artery disease. (01/21/2010 10:59)      Cardiac Cath  Procedure date:  01/21/2010  Findings:      1. Severe native three-vessel coronary artery disease. 2. Status post coronary artery bypass graft with 4/4 patent grafts. 3. Severe right renal artery stenosis with successful primary     stenting.   RECOMMENDATIONS:  The patient should receive 30 days of Plavix.  He has patent bypass grafts that can be treated medically for his coronary artery disease.

## 2011-01-19 NOTE — Assessment & Plan Note (Signed)
Summary: [facility]   Visit Type:  Follow-up Primary Provider:  Marga Melnick MD  CC:  CAD.  History of Present Illness: The patient returns for followup after recent renal artery stenting. He had some chest discomfort at that time and also had a cardiac catheterization. He does have severe native vessel coronary disease but patent grafts. Dr. Excell Seltzer treated his right renal artery with stenting. He is completing a course of Plavix today. He is to get a basic metabolic profile today. He has had no postprocedure complications. He's had no new chest discomfort, neck or arm discomfort. He has had no new shortness of breath, PND or orthopnea. Of note he is being followed for a mildly low platelet count and white blood cell count.  Current Medications (verified): 1)  Fish Oil 1000 Mg  Caps (Omega-3 Fatty Acids) .... 3x A Week 2)  Alprazolam 0.5 Mg  Tabs (Alprazolam) .... Half To One Tablet P.o. T.i.d. P.r.n. Anxiety.  1  To 1.5 Tablet Nightly P.r.n. Insomnia. 3)  Nitroglycerin 0.4 Mg  Subl (Nitroglycerin) .Marland Kitchen.. 1 By Mouth Under Tongue As Needed For Chest Pain - Can Repeat in 5 Minutes Er If No Better 4)  Crestor 40 Mg Tabs (Rosuvastatin Calcium) .... 1/2 Tablet Daily 5)  Zetia 10 Mg Tabs (Ezetimibe) .... 1/2 Tablet Daily 6)  Aspirin 81 Mg  Tabs (Aspirin) .... Daily 7)  Coreg 3.125 Mg Tabs (Carvedilol) .... Take 1 Tablet By Mouth Once A Day 8)  Furosemide 40 Mg Tabs (Furosemide) .... 1/2 A Tablet As Needed For Fluid Retention 9)  Lisinopril 10 Mg Tabs (Lisinopril) .... Daily 10)  K-Tabs 10 Meq Cr-Tabs (Potassium Chloride) .... 1/2 Tablet Once Daily 11)  Sertraline Hcl 25 Mg Tabs (Sertraline Hcl) .... 1/2 Tab Once Daily 12)  Plavix 75 Mg Tabs (Clopidogrel Bisulfate) .Marland Kitchen.. 1 By Mouth Once Daily X 30 Days. Will D/c 02/25/2010 13)  Loratadine 10 Mg Tabs (Loratadine) .Marland Kitchen.. 1 Once Daily As Needed Allergies  Allergies (verified): 1)  ! * Niaspan  Past History:  Past Medical History: Reviewed  history from 01/19/2010 and no changes required. Coronary artery disease  a.     Myocardial infarction in 2003.       b.     Status post cardiac catheterization in 2003 revealing severe        three-vessel disease.       c.     Status post coronary artery bypass grafting, LIMA to LAD,        SVG to diagonal, SVG to RCA, and SVG to circumflex per Dr.        Tyrone Sage in May 2003.  Hyperlipidemia Anxiety Fatigue Insomnia Renal artery stenosis - bilateral  Past Surgical History: Reviewed history from 02/11/2010 and no changes required. Coronary artery bypass graft Colon polypectomy remotely, seen q 5 yrs Renal Artery Stenting 01/28/2010   Review of Systems       As stated in the HPI and negative for all other systems.   Vital Signs:  Patient profile:   77 year old male Height:      64 inches Weight:      157 pounds BMI:     27.05 Pulse rate:   69 / minute Resp:     16 per minute BP sitting:   110 / 62  (right arm)  Vitals Entered By: Marrion Coy, CNA (February 25, 2010 9:20 AM)  Physical Exam  General:  Well developed, well nourished, in no acute distress. Head:  normocephalic and atraumatic Eyes:  PERRLA/EOM intact; conjunctiva and lids normal. Mouth:  Teeth, gums and palate normal. Oral mucosa normal. Neck:  Neck supple, no JVD. No masses, thyromegaly or abnormal cervical nodes. Chest Wall:  no deformities or breast masses noted Lungs:  Clear bilaterally to auscultation and percussion. Heart:  Sand S2 within normal limits, no S3, no S4, no clicks, no rubs Abdomen:  Bowel sounds positive; abdomen soft and non-tender without masses, organomegaly, or hernias noted. No hepatosplenomegaly. Msk:  Back normal, normal gait. Muscle strength and tone normal. Pulses:  2+ upper pulses, 2+ dorsalis pedis and posterior tibialis on the right, 1+ dorsalis pedis and posterior tibialis on the left Extremities:  No clubbing or cyanosis. Neurologic:  Alert and oriented x 3. Psych:  Normal  affect.   EKG  Procedure date:  02/25/2010  Findings:      sinus rhythm, rate 69, axis within normal limits, intervals within normal limits, no acute ST-T wave changes.  Impression & Recommendations:  Problem # 1:  RENAL INSUFFICIENCY (ICD-588.9) He he is now status post stenting. I will check a basic metabolic profile today. Further evaluation or therapy will be based on these results.  Problem # 2:  HYPERTENSION, ESSENTIAL NOS (ICD-401.9) His blood pressure is well controlled and he does not report any labile readings. He will remain on the meds as listed.  Problem # 3:  C A D (ICD-414.00) He will continue with risk reduction.  Problem # 4:  ANXIETY (ICD-300.00) I did take the liberty of giving him one month prescription for his alprazolam as he is about to run out. However, I referred him back to Dr. Alwyn Ren for further renewals.  Other Orders: EKG w/ Interpretation (93000)  Patient Instructions: 1)  Your physician recommends that you schedule a follow-up appointment in: 6 months with Dr Antoine Poche 2)  Your physician recommends that you continue on your current medications as directed. Please refer to the Current Medication list given to you today. Prescriptions: ALPRAZOLAM 0.5 MG  TABS (ALPRAZOLAM) half to one tablet p.o. t.i.d. p.r.n. anxiety.  1  to 1.5 tablet nightly p.r.n. insomnia.  #45 x 0   Entered by:   Charolotte Capuchin, RN   Authorized by:   Rollene Rotunda, MD, [facility]   Signed by:   Charolotte Capuchin, RN on 02/25/2010   Method used:   Print then Give to Patient   RxID:   [id number]

## 2011-01-19 NOTE — Letter (Signed)
Summary: Appointment - Missed  [facility]     [place], [place]    Phone:   Fax:      January 13, 2010 MRN: [id number]   [facility] [address] [place], [place]  [address]   Dear Mr. STRUBEL,  Our records indicate you missed your appointment on 01-13-2010  with  Dr.  Antoine Poche It is very important that we reach you to reschedule this appointment. We look forward to participating in your health care needs. Please contact us at the number listed above at your earliest convenience to reschedule this appointment.     Sincerely,   Lorne Skeens  [facility] Scheduling Team

## 2011-01-21 NOTE — Assessment & Plan Note (Signed)
Summary: changing [place] to local ph[facility]--needs new prescriptions, med ...   Vital Signs:  Patient profile:   77 year old male Weight:      155.6 pounds BMI:     26.81 Temp:     97.8 degrees F oral Pulse rate:   76 / minute Resp:     15 per minute BP sitting:   108 / 68  (left arm) Cuff size:   large CC: RX request    Primary Care Provider:  Marga Melnick MD  CC:  RX request .  History of Present Illness:      This is an 77 year old man who presents for Hyperlipidemia follow-up.  The patient reports  occasional muscle aches and constipation ( Miralax & Metamucil  employed regularly ), but denies GI upset, abdominal pain, flushing, itching, diarrhea, and fatigue.  Other symptoms include exercise intolerance "after 1.5-2 hrs   of  yardwork") and palpitations.  The patient denies the following symptoms: chest pain/pressure ( no NTG taken) and syncope.  Compliance with medications (by patient report) has been near 100%.  The patient reports no exercise  since onset of  Winter.  Adjunctive measures currently used by the patient include fiber, ASA, and fish oil supplements.                                                          He denies active depressive symptoms ( see ROS)  ; he wishes to D/C Sertraline.  Current Medications (verified): 1)  Nitroglycerin 0.4 Mg  Subl (Nitroglycerin) .Marland Kitchen.. 1 By Mouth Under Tongue As Needed For Chest Pain - Can Repeat in 5 Minutes Er If No Better 2)  Crestor 40 Mg Tabs (Rosuvastatin Calcium) .... 1/2 Tablet Daily 3)  Zetia 10 Mg Tabs (Ezetimibe) .... 1/2 Tablet Daily 4)  Coreg 3.125 Mg Tabs (Carvedilol) .... Take 1 Tablet By Mouth Once A Day 5)  Furosemide 40 Mg Tabs (Furosemide) .... 1/2 A Tablet As Needed For Fluid Retention 6)  Lisinopril 10 Mg Tabs (Lisinopril) .... Daily 7)  K-Tabs 10 Meq Cr-Tabs (Potassium Chloride) .... 1/2 Tablet Once Daily 8)  Sertraline Hcl 25 Mg Tabs (Sertraline Hcl) .... 1/2 Tab Once Daily As Directed 9)  Tamsulosin Hcl 0.4 Mg  Caps (Tamsulosin Hcl) 10)  Alprazolam 0.5 Mg Tabs (Alprazolam) .... Half To One Tablet P.o. T.i.d. P.r.n. Anxiety.  1  To 1.5 Tablet Nightly P.r.n. Insomnia. 11)  Aspirin 81 Mg Tabs (Aspirin) .Marland Kitchen.. 1 By Mouth Once Daily 12)  Fish Oil 1000 Mg Caps (Omega-3 Fatty Acids) .Marland Kitchen.. 1 By Mouth Once Daily 13)  Remtyme (Sleep Aid) .Marland Kitchen.. 1 By Mouth At Bedtime  Allergies: 1)  ! * Niaspan  Review of Systems ENT:  Denies ringing in ears. GU:  Denies discharge, dysuria, and hematuria; Urine "starts & stops " w/o Flomax. Psych:  Complains of anxiety and depression; denies easily angered, easily tearful, and irritability; "Depression from too much medication".  Physical Exam  General:  in no acute distress; alert,appropriate and cooperative throughout examination Lungs:  Normal respiratory effort, chest expands symmetrically. Lungs are clear to auscultation, no crackles or wheezes. Heart:  Normal rate and regular rhythm. S1 and S2 normal without gallop, murmur, click, rub. S4 Pulses:  R and L carotid,radial,dorsalis pedis and posterior tibial pulses are  full and equal bilaterally Extremities:  No clubbing, cyanosis, edema. Psych:  memory intact for recent and remote, flat affect, and subdued.     Impression & Recommendations:  Problem # 1:  HYPERLIPIDEMIA (ICD-272.4)  His updated medication list for this problem includes:    Crestor 40 Mg Tabs (Rosuvastatin calcium) .Marland Kitchen... 1/2 tablet daily    Zetia 10 Mg Tabs (Ezetimibe) .Marland Kitchen... 1/2 tablet daily  Problem # 2:  C A D (ICD-414.00)  His updated medication list for this problem includes:    Nitroglycerin 0.4 Mg Subl (Nitroglycerin) .Marland Kitchen... 1 by mouth under tongue as needed for chest pain - can repeat in 5 minutes er if no better    Coreg 3.125 Mg Tabs (Carvedilol) .Marland Kitchen... Take 1 tablet by mouth once a day    Furosemide 40 Mg Tabs (Furosemide) .Marland Kitchen... 1/2 a tablet as needed for fluid retention    Lisinopril 10 Mg Tabs (Lisinopril) .Marland Kitchen... Daily    Aspirin 81 Mg  Tabs (Aspirin) .Marland Kitchen... 1 by mouth once daily  Problem # 3:  DEPRESSIVE DISORDER (ICD-311) he wants to D/C Sertraline The following medications were removed from the medication list:    Sertraline Hcl 25 Mg Tabs (Sertraline hcl) .Marland Kitchen... 1/2 tab once daily as directed His updated medication list for this problem includes:    Alprazolam 0.5 Mg Tabs (Alprazolam) ..... Half to one tablet p.o. t.i.d. p.r.n. anxiety.  1  to 1.5 tablet nightly p.r.n. insomnia.  Problem # 4:  PERSONAL HISTORY OTHER DISORDER URINARY SYSTEM (ICD-V13.09)  Complete Medication List: 1)  Nitroglycerin 0.4 Mg Subl (Nitroglycerin) .Marland Kitchen.. 1 by mouth under tongue as needed for chest pain - can repeat in 5 minutes er if no better 2)  Crestor 40 Mg Tabs (Rosuvastatin calcium) .... 1/2 tablet daily 3)  Zetia 10 Mg Tabs (Ezetimibe) .... 1/2 tablet daily 4)  Coreg 3.125 Mg Tabs (Carvedilol) .... Take 1 tablet by mouth once a day 5)  Furosemide 40 Mg Tabs (Furosemide) .... 1/2 a tablet as needed for fluid retention 6)  Lisinopril 10 Mg Tabs (Lisinopril) .... Daily 7)  K-tabs 10 Meq Cr-tabs (Potassium chloride) .... 1/2 tablet once daily 8)  Tamsulosin Hcl 0.4 Mg Caps (Tamsulosin hcl) 9)  Alprazolam 0.5 Mg Tabs (Alprazolam) .... Half to one tablet p.o. t.i.d. p.r.n. anxiety.  1  to 1.5 tablet nightly p.r.n. insomnia. 10)  Aspirin 81 Mg Tabs (Aspirin) .Marland Kitchen.. 1 by mouth once daily 11)  Fish Oil 1000 Mg Caps (Omega-3 fatty acids) .Marland Kitchen.. 1 by mouth once daily 12)  Remtyme (sleep Aid)  .Marland Kitchen.. 1 by mouth at bedtime  Patient Instructions: 1)   Stop Sertraline & assess response.Please schedule fasting labs: 2)  BMP , ICD-9:272.4 3)  Hepatic Panel , ICD-9:995.20 4)  Lipid Panel  , ICD-9:272.4 Prescriptions: NITROGLYCERIN 0.4 MG  SUBL (NITROGLYCERIN) 1 by mouth under tongue as needed for chest pain - can repeat in 5 minutes ER if no better  #1 bottle x 11   Entered and Authorized by:   Marga Melnick MD   Signed by:   Marga Melnick MD on  12/22/2010   Method used:   Faxed to ...       [facility]  [address] [id number]* (retail)       [address]       [place], [place]  [address]       Ph: [phone] or [phone]       Fax: [phone]  RxID:   [id number] TAMSULOSIN HCL 0.4 MG CAPS (TAMSULOSIN HCL)   #90 x 1   Entered and Authorized by:   Marga Melnick MD   Signed by:   Marga Melnick MD on 12/22/2010   Method used:   Faxed to ...       [facility]  [address] [id number]* (retail)       [address]       [place], [place]  [address]       Ph: [phone] or [phone]       Fax: [phone]   RxID:   [id number]    Orders Added: 1)  Est. Patient Level III [60630]

## 2011-03-04 ENCOUNTER — Telehealth (INDEPENDENT_AMBULATORY_CARE_PROVIDER_SITE_OTHER): Payer: Self-pay | Admitting: *Deleted

## 2011-03-09 ENCOUNTER — Other Ambulatory Visit (INDEPENDENT_AMBULATORY_CARE_PROVIDER_SITE_OTHER): Payer: Medicare PPO

## 2011-03-09 DIAGNOSIS — T887XXA Unspecified adverse effect of drug or medicament, initial encounter: Secondary | ICD-10-CM

## 2011-03-09 DIAGNOSIS — I10 Essential (primary) hypertension: Secondary | ICD-10-CM

## 2011-03-09 DIAGNOSIS — Z79899 Other long term (current) drug therapy: Secondary | ICD-10-CM

## 2011-03-09 DIAGNOSIS — E785 Hyperlipidemia, unspecified: Secondary | ICD-10-CM

## 2011-03-09 LAB — LIPID PANEL
Cholesterol: 156 mg/dL (ref 0–200)
LDL Cholesterol: 100 mg/dL — ABNORMAL HIGH (ref 0–99)
Total CHOL/HDL Ratio: 4

## 2011-03-09 LAB — BASIC METABOLIC PANEL
BUN: 17 mg/dL (ref 6–23)
Calcium: 9.2 mg/dL (ref 8.4–10.5)
Chloride: 106 mEq/L (ref 96–112)
Creatinine, Ser: 1 mg/dL (ref 0.4–1.5)
GFR: 76.73 mL/min (ref >60.00)
Glucose, Bld: 93 mg/dL (ref 70–99)

## 2011-03-09 LAB — HEPATIC FUNCTION PANEL
AST: 21 U/L (ref 0–37)
Albumin: 4.1 g/dL (ref 3.5–5.2)
Bilirubin, Direct: 0.2 mg/dL (ref 0.0–0.3)
Total Bilirubin: 1.1 mg/dL (ref 0.3–1.2)

## 2011-03-09 NOTE — Progress Notes (Signed)
Summary: new rx  Phone Note Refill Request Call back at Home Phone [phone]   Refills Requested: Medication #1:  CRESTOR 40 MG TABS 1/2 tablet daily  Medication #2:  ZETIA 10 MG TABS 1/2 tablet daily patient had samples - needs rx called in [facility] [address] -     Next Appointment Scheduled: lab appt [id number] Initial call taken by: Okey Regal [place],  March 04, 2011 9:41 AM    Prescriptions: ZETIA 10 MG TABS (EZETIMIBE) 1/2 tablet daily  #15 x 0   Entered by:   Shonna Chock CMA   Authorized by:   Marga Melnick MD   Signed by:   Shonna Chock CMA on 03/04/2011   Method used:   Electronically to        [facility] [id number] (retail)       [address]       [place], [place]  [address]       Ph: [phone] or [phone]       Fax: [phone]   RxID:   [id number] CRESTOR 40 MG TABS (ROSUVASTATIN CALCIUM) 1/2 tablet daily  #15 x 0   Entered by:   Shonna Chock CMA   Authorized by:   Marga Melnick MD   Signed by:   Shonna Chock CMA on 03/04/2011   Method used:   Electronically to        [facility] [id number]* (retail)       [address]       [place], [place]  [address]       Ph: [phone] or [phone]       Fax: [phone]   RxID:   [id number]

## 2011-03-14 NOTE — Progress Notes (Signed)
Excellent results ; no changes required. [employer]

## 2011-03-15 NOTE — Progress Notes (Signed)
Copy of labs mailed to patient.

## 2011-03-26 LAB — CARDIAC PANEL(CRET KIN+CKTOT+MB+TROPI)
CK, MB: 5.2 ng/mL — ABNORMAL HIGH (ref 0.3–4.0)
Relative Index: 4 — ABNORMAL HIGH (ref 0.0–2.5)
Total CK: 108 U/L (ref 7–232)
Troponin I: 0.01 ng/mL (ref 0.00–0.06)
Troponin I: 0.02 ng/mL (ref 0.00–0.06)

## 2011-03-26 LAB — DIFFERENTIAL
Basophils Relative: 0 % (ref 0–1)
Lymphs Abs: 0.5 10*3/uL — ABNORMAL LOW (ref 0.7–4.0)
Monocytes Absolute: 0.6 10*3/uL (ref 0.1–1.0)
Monocytes Relative: 14 % — ABNORMAL HIGH (ref 3–12)
Neutro Abs: 3.1 10*3/uL (ref 1.7–7.7)

## 2011-03-26 LAB — BASIC METABOLIC PANEL
BUN: 23 mg/dL (ref 6–23)
Calcium: 9.1 mg/dL (ref 8.4–10.5)
Calcium: 9.1 mg/dL (ref 8.4–10.5)
Chloride: 98 mEq/L (ref 96–112)
Creatinine, Ser: 1.21 mg/dL (ref 0.4–1.5)
GFR calc Af Amer: >60 mL/min (ref >60)
GFR calc Af Amer: >60 mL/min (ref >60)
GFR calc non Af Amer: 52 mL/min — ABNORMAL LOW (ref >60)
GFR calc non Af Amer: 58 mL/min — ABNORMAL LOW (ref >60)
Potassium: 4.7 mEq/L (ref 3.5–5.1)
Sodium: 132 mEq/L — ABNORMAL LOW (ref 135–145)

## 2011-03-26 LAB — COMPREHENSIVE METABOLIC PANEL
AST: 28 U/L (ref 0–37)
Alkaline Phosphatase: 47 U/L (ref 39–117)
BUN: 20 mg/dL (ref 6–23)
GFR calc Af Amer: >60 mL/min (ref >60)
GFR calc non Af Amer: 57 mL/min — ABNORMAL LOW (ref >60)
Total Bilirubin: 1 mg/dL (ref 0.3–1.2)
Total Protein: 5.8 g/dL — ABNORMAL LOW (ref 6.0–8.3)

## 2011-03-26 LAB — LIPID PANEL
HDL: 36 mg/dL — ABNORMAL LOW (ref >39)
Total CHOL/HDL Ratio: 3.6 RATIO
VLDL: 16 mg/dL (ref 0–40)

## 2011-03-26 LAB — CK TOTAL AND CKMB (NOT AT ARMC)
CK, MB: 5.1 ng/mL — ABNORMAL HIGH (ref 0.3–4.0)
Relative Index: 3.7 — ABNORMAL HIGH (ref 0.0–2.5)
Total CK: 138 U/L (ref 7–232)

## 2011-03-26 LAB — CBC
MCHC: 34.1 g/dL (ref 30.0–36.0)
MCV: 93.4 fL (ref 78.0–100.0)
RBC: 4.28 MIL/uL (ref 4.22–5.81)
RDW: 14.1 % (ref 11.5–15.5)
WBC: 4.3 10*3/uL (ref 4.0–10.5)

## 2011-04-01 ENCOUNTER — Other Ambulatory Visit: Payer: Self-pay | Admitting: Internal Medicine

## 2011-04-02 ENCOUNTER — Emergency Department (HOSPITAL_COMMUNITY): Payer: Medicare PPO

## 2011-04-02 ENCOUNTER — Emergency Department (HOSPITAL_COMMUNITY)
Admission: EM | Admit: 2011-04-02 | Discharge: 2011-04-02 | Disposition: A | Discharge status: Home / Self Care | Payer: Medicare PPO | Attending: Emergency Medicine | Admitting: Emergency Medicine

## 2011-04-02 DIAGNOSIS — IMO0002 Reserved for concepts with insufficient information to code with codable children: Secondary | ICD-10-CM | POA: Insufficient documentation

## 2011-04-02 DIAGNOSIS — Z951 Presence of aortocoronary bypass graft: Secondary | ICD-10-CM | POA: Insufficient documentation

## 2011-04-02 DIAGNOSIS — Y92009 Unspecified place in unspecified non-institutional (private) residence as the place of occurrence of the external cause: Secondary | ICD-10-CM | POA: Insufficient documentation

## 2011-04-02 DIAGNOSIS — W1789XA Other fall from one level to another, initial encounter: Secondary | ICD-10-CM | POA: Insufficient documentation

## 2011-04-02 DIAGNOSIS — I509 Heart failure, unspecified: Secondary | ICD-10-CM | POA: Insufficient documentation

## 2011-04-02 DIAGNOSIS — I10 Essential (primary) hypertension: Secondary | ICD-10-CM | POA: Insufficient documentation

## 2011-04-07 ENCOUNTER — Other Ambulatory Visit: Payer: Self-pay | Admitting: Internal Medicine

## 2011-04-10 ENCOUNTER — Encounter: Payer: Self-pay | Admitting: Cardiology

## 2011-04-13 ENCOUNTER — Encounter: Payer: Self-pay | Admitting: Cardiology

## 2011-04-13 ENCOUNTER — Ambulatory Visit (INDEPENDENT_AMBULATORY_CARE_PROVIDER_SITE_OTHER): Payer: Medicare PPO | Admitting: Cardiology

## 2011-04-13 VITALS — BP 137/73 | HR 82 | Ht 64.0 in | Wt 153.0 lb

## 2011-04-13 DIAGNOSIS — N259 Disorder resulting from impaired renal tubular function, unspecified: Secondary | ICD-10-CM

## 2011-04-13 DIAGNOSIS — I10 Essential (primary) hypertension: Secondary | ICD-10-CM

## 2011-04-13 DIAGNOSIS — I2510 Atherosclerotic heart disease of native coronary artery without angina pectoris: Secondary | ICD-10-CM

## 2011-04-13 DIAGNOSIS — E782 Mixed hyperlipidemia: Secondary | ICD-10-CM

## 2011-04-13 DIAGNOSIS — I498 Other specified cardiac arrhythmias: Secondary | ICD-10-CM

## 2011-04-13 NOTE — Assessment & Plan Note (Signed)
The blood pressure is at target. No change in medications is indicated. We will continue with therapeutic lifestyle changes (TLC).  

## 2011-04-13 NOTE — Patient Instructions (Signed)
Follow up with Dr Hochrein in 1 year. Continue current medications 

## 2011-04-13 NOTE — Assessment & Plan Note (Signed)
I reviewed recent lipids. He will continue meds as listed. He had an excellent profile.

## 2011-04-13 NOTE — Assessment & Plan Note (Signed)
He is having no symptomatic bradycardia arrhythmias. No change in therapy is indicated.

## 2011-04-13 NOTE — Assessment & Plan Note (Signed)
He is having no new symptoms. We will continue with risk reduction. I will likely order a stress test next year as it would have been 10 years since bypass.

## 2011-04-13 NOTE — Assessment & Plan Note (Signed)
His renal function recently was normal. He sat followup renal Dopplers and no further imaging is indicated.

## 2011-04-13 NOTE — Progress Notes (Signed)
HPI The patient presents for yearly followup. Since I last saw him he has had no new cardiovascular complaints. Unfortunately he fell injuring his leg. He said some difficulties in significant pain related to this. He is followed by an orthopedist. He has still been active though slightly limited. With activity such as significant yard work he denies any chest pressure, neck or arm discomfort. He has no palpitations, presyncope or syncope. He has no shortness of breath, PND or orthopnea. He has had no weight gain. He has had some right lower extremity edema related to his injury but this is actually slowly improving.  Allergies  Allergen Reactions  . Niacin     Current Outpatient Prescriptions  Medication Sig Dispense Refill  . ALPRAZolam (XANAX) 0.5 MG tablet TAKE 1/2 TO 1 TABLET BY MOUTH 3 TIMES A DAY IF NEEDED FOR ANXIETY AND 1 TO 1 AND 1/2 TABS AT BEDTIME IF NEEDED FOR INSOMNIA.  41 tablet  0  . aspirin 81 MG tablet Take 81 mg by mouth daily.        . carvedilol (COREG) 3.125 MG tablet Take 3.125 mg by mouth 2 (two) times daily with a meal.        . CRESTOR 40 MG tablet take 1/2 tablet by mouth once daily  45 tablet  3  . Doxylamine Succinate, Sleep, (SLEEP AID PO) Take by mouth at bedtime as needed.        . furosemide (LASIX) 40 MG tablet Take 20 mg by mouth as needed.        Marland Kitchen HYDROcodone-acetaminophen (NORCO) 5-325 MG per tablet       . lisinopril (PRINIVIL,ZESTRIL) 10 MG tablet Take 10 mg by mouth daily.        . nitroGLYCERIN (NITROSTAT) 0.4 MG SL tablet Place 0.4 mg under the tongue every 5 (five) minutes as needed.        . Omega-3 Fatty Acids (FISH OIL) 1000 MG CAPS Take 1 capsule by mouth daily.        . potassium chloride (K-DUR,KLOR-CON) 10 MEQ tablet Take by mouth daily. Take 1/2 tablet        . Tamsulosin HCl (FLOMAX) 0.4 MG CAPS Take 0.4 mg by mouth daily.        Marland Kitchen ZETIA 10 MG tablet take 1/2 tablet by mouth once daily  45 tablet  3    Past Medical History  Diagnosis  Date  . Myocardial infarction 2003    hx of   . Hyperlipidemia   . Anxiety   . Fatigue   . Insomnia   . Coronary artery disease   . Renal artery stenosis     bilateral    Past Surgical History  Procedure Date  . Cardiac catheterization 2003    revdeling severe three-vessel disease  . Coronary artery bypass graft     LIMA to LAD, SVG to diagonal, SVG to RCA, and SVG to circumflex  . Polypectomy     colon  . Renal artery stent 01-28-10    ROS:  As stated in the HPI and negative for all other systems.  PHYSICAL EXAM BP 137/73  Pulse 82  Ht 5\' 4"  (1.626 m)  Wt 153 lb (69.4 kg)  BMI 26.26 kg/m2 GENERAL:  Well appearing HEENT:  Pupils equal round and reactive, fundi not visualized, oral mucosa unremarkable NECK:  No jugular venous distention, waveform within normal limits, carotid upstroke brisk and symmetric, no bruits, no thyromegaly LYMPHATICS:  No cervical, inguinal adenopathy LUNGS:  Clear to auscultation bilaterally BACK:  No CVA tenderness CHEST:  Well healed sternotomy scar. HEART:  PMI not displaced or sustained,S1 and S2 within normal limits, no S3, no S4, no clicks, no rubs, no murmurs ABD:  Flat, positive bowel sounds normal in frequency in pitch, no bruits, no rebound, no guarding, no midline pulsatile mass, no hepatomegaly, no splenomegaly EXT:  2 plus pulses throughout, right lower extremity edema and calf tenderness without cord or mass, no cyanosis no clubbing SKIN:  No rashes no nodules NEURO:  Cranial nerves II through XII grossly intact, motor grossly intact throughout PSYCH:  Cognitively intact, oriented to person place and time   ASSESSMENT AND PLAN

## 2011-05-04 NOTE — Assessment & Plan Note (Signed)
[facility]                                 ON-CALL NOTE   ALPHA, MYSLIWIEC                        MRN:          [id number]  DATE:01/26/2008                            DOB:          05/12/1926    CALLER:  Burna Mortimer, the patient's wife.   He sees Dr. Alwyn Ren.   PHONE NUMBER:  [phone].   I received 2 phone calls about this patient.  The first one was at 7:10  p.m. and the second was at 8:36 p.m.  During the first phone call, his  wife reports that the patient's blood pressure was up to 200/114 with a  pulse rate that varied between 55 and 65.  She says she had taken him to  [facility] emergency room earlier in the day because of a spike in his  blood pressure.  After being there for several hours and demonstrating  normal blood pressures, she was told that nothing was wrong and that he  should go back home.  Now, the blood pressure has gone back up again.  He feels fine and denies shortness of breath or chest pain.  His usual  medications include metoprolol tartrate 50 mg 1/2 tablet b.i.d., also  enalapril 10 mg once each morning.  He does have sublingual  nitroglycerin in the house, but has not used any in quite some time.  My  response is, over the weekend, to increase his enalapril to 10 mg b.i.d.  so she could go ahead and give him an extra dose now.  They could also  give him a sublingual nitroglycerin now, because of its effects to help  lower his blood pressure.  She stated that she would do so, and she  would call me back in a bit.  Then, during the second telephone call,  his blood pressure was down to 155/81, his pulse was 55 and steady.  He  reported feeling fine.  At this point, I advised them to continue to use  the b.i.d. dosing on the enalapril.  They should contact Dr. Alwyn Ren next  week for further instructions.  If he has another high blood pressure  that does not respond to a single nitroglycerin over the weekend, they  should call  me back.     Tera Mater. Clent Ridges, MD  Electronically Signed    SAF/MedQ  DD: 01/27/2008  DT: 01/29/2008  Job #: [id number]

## 2011-05-04 NOTE — Assessment & Plan Note (Signed)
[facility]                                 ON-CALL NOTE   DEAARON, FULGHUM                        MRN:          [id number]  DATE:01/19/2008                            DOB:          Apr 25, 1926    Caller is Larell Baney, his wife, on January 19, 2008 at 6:21 p.m. Phone  number is [phone]. Regular doctor is Dr. Alwyn Ren.   CHIEF COMPLAINT:  Weakness.   The patient's wife said he woke up this morning very weak and  disoriented. He got up and slumped over. She called the EMS. She took  him to [facility] where she thought they did a pretty complete  workup including urine tests and some short of imaging tests for a  stroke. They sent him home not finding anything wrong. He had slept all  day, and now he is less responsive than he was before. He does know who  she is but is very slow to respond and not acting like himself. I  advised her to take him back to the emergency room for evaluation, and  this is what she is going to do.     Marne A. Tower, MD  Electronically Signed    MAT/MedQ  DD: 01/19/2008  DT: 01/20/2008  Job #: [id number]

## 2011-05-07 NOTE — Discharge Summary (Signed)
NAME:  Victor Waters, Victor Waters NO.:  [id number]   MEDICAL RECORD NO.:  [id number]          PATIENT TYPE:  INP   LOCATION:  1407                         FACILITY:  [facility]   PHYSICIAN:  Raenette Rover. Felicity Coyer, MDDATE OF BIRTH:  Nov 06, 1926   DATE OF ADMISSION:  11/05/2006  DATE OF DISCHARGE:  11/07/2006                                 DISCHARGE SUMMARY   DISCHARGE DIAGNOSES:  1. Right lower extremity cellulitis, improved.  Changed IV Ancef to p.o.      Keflex and continue additional 7 days x10 days total antibiotic worth.      Rule out deep venous thrombosis negative.  See details below.  2. History of coronary disease status post bypass 2003.  Continue medical      management.  3. Hypertension.  Reasonable control.  Continue medical management.  4. History of benign prostatic hypertrophy.  Continue medical management.  5. Dyslipidemia.  Continue home treatment.   HOSPITAL FOLLOW UP:  With his primary care physician, Dr. Lona Kettle, for  this Friday, November 23, at 1:15 p.m.  The patient is instructed to call if  worsening pain, redness, swelling.  Will return if fevers once discharged  home prior to that time.   DISCHARGE MEDICATIONS:  1. Keflex 500 mg p.o. t.i.d. x7 additional days.  Prescription for 21 tabs      provided.  2. Other medications are as prior to admission without change and include      Enalapril 10 mg p.o. daily.  3. BuSpar 1/2 of 15 mg tab t.i.d. p.r.n. as prior to admission.  4. Niacin 500 mg p.o. q.h.s.  5. Metoprolol 25 mg p.o. b.i.d.  6. Fluvastatin 80 mg p.o. q.h.s.   CONDITION ON DISCHARGE:  Medically improved and stable.  Resolution of right  lower extremity redness, swelling, and pain.  No fever x48 hours.   HOSPITAL COURSE BY PROBLEM:  RIGHT LOWER EXTREMITY CELLULITIS:  The patient  is a pleasant 77 year old gentleman with past medical history as listed  above, who presented to the emergency room with complaint of 2 days redness,  swelling,  and pain in the right lower extremity and calf.  Please see  dictated H&P by Dr. Thomos Lemons for full details.  He was admitted for what  appeared to be a cellulitis and treated with IV Ancef.  Then until a DVT  could be excluded, he was also treated with full dose Lovenox.  Because of a  mildly elevated D-dimer, while waiting for the Dopplers to be performed on  Monday, the patient underwent a CT scan which showed no evidence of PE, mild  bilateral tiny scattered pulmonary nodules, largest being 7 mm, recommending  noncontrast CT follow up in 6 months.  We will defer to primary MD.  Over  these 48 hours, the patient's symptoms have markedly improved with  resolution of edema, redness, and fevers.  His IV Ancef will be changed to  p.o. Keflex, and he will be treated for another 7 days to complete 10 day  course for this cellulitis.  He will still undergo lower extremity Dopplers  today prior to discharge to ensure there is no DVT and should these be  negative, discharge home  as planned.  Obviously, if Dopplers are concerning for clot, we will change  plans and continue anticoagulation, but this is not anticipated at this  time.  Outpatient follow up with primary MD later this week as scheduled.  All other medications as prior to admission without change.      Valerie A. Felicity Coyer, MD  Electronically Signed     VAL/MEDQ  D:  11/07/2006  T:  11/07/2006  Job:  [id number]

## 2011-05-07 NOTE — Discharge Summary (Signed)
[place]. [facility]  Patient:    Victor Waters, Victor Waters Visit Number: [id number] MRN: [id number]          Service Type: MED Location: [phone] Attending Physician:  Dorena Cookey Dictated by:   Titus Dubin. Alwyn Ren, M.D. [facility] Admit Date:  04/25/2002 Discharge Date: 04/28/2002   CC:         Rollene Rotunda, M.D.  Frederico Hamman, M.D.   Discharge Summary  ADMISSION DIAGNOSES: 1. Presyncope. 2. Nonexertional chest pain. 3. Hypertension. 4. Hypercholesterolemia. 5. Questionable depression.  DISCHARGE DIAGNOSES: Significant coronary artery disease with a 60-70% LAD lesion with multi-vessel involvement ranging from 20-40%. The right coronary artery has 100% blockage proximally with left to right collaterals.  PROCEDURES: Cardiac catheterization on Apr 27, 2002 by Dr. Rollene Rotunda.  HISTORY OF PRESENT ILLNESS: Victor Waters is a 77 year old male who presented with facial flushing, presyncope, and left extremity weakness. 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 These symptoms were not associated with chest pain, shortness of breath, or diaphoresis. He did have nausea without emesis. He did have some chest pain, which was nonexertional earlier in the week without nausea or diaphoresis. He took two baby aspirin with benefit. Because of minor symptoms, he took additional aspirin, two each time times two the other day with complete resolution of the pain after the sixth baby aspirin. At that time, EMS was called but he refused transport to the emergency room.  HOSPITAL COURSE: The patient presented with a blood pressure of 158/79 with pulse of 67 and respiratory rate of 18. He was in no acute distress. Chest x-ray was negative. He was placed on telemetry with cardiac enzymes. He underwent catheterization with the findings noted above. Bypass surgery was recommended. At this time, the patient and his wife want to wait several weeks and reconsider. They  also want to celebrate their anniversary with a trip prior to considering surgery. Significantly, he has been using mail order supplements to treat his cholesterol. By history, he was intolerant to Lipitor, which caused headaches. There was a great concern about the cost of medicines. He is planning to go to the [facility] to get his medications in the future. Dietary compliance is questionable at best, as he eats eggs every morning. The labs were discussed with the patient. It was recommended that he employ Dr. Danice Goltz program and [facility] (eat, drink, and be healthy), as well as increase his dietary oatmeal, garlic, and substitute Benecol or TC (take control) for butter or margarine.  LABORATORY DATA: Serial enzymes revealed a CPK with a high at 164 which dropped to 107 over 24 hours. His MB was high at 5.5 with a 3.4% index. By the third discrimination, the MB was 3.8 with 3.6% relative index. Troponin times two was 0.01 and 0.02, with no indication of myocardial injury. Cholesterol panel revealed total cholesterol of 213 with HDL of 37 and LDL of 157 and normal triglycerides.  Pending at the time of discharge summary is a TSH. CBC and differential were normal except for platelet count of 132,000. BMP was normal except for mild azotemia of 24.  DIAGNOSTIC STUDIES: Nuclear stress test revealed hypokinesia involving the inferior wall and the distal septum and apex with no paradoxic motion. Ejection fraction was 35%. Slight small focus of ischemia was suggested in the inferior aspect of the lateral wall. Chest x-ray revealed no active disease.  DISCHARGE STATUS: Improved.  PROGNOSIS: Depends on controlling risk factors.  DISPOSITION:  He will be asked not to return to work until seen. He has been asked to be seen on May 03, 2002. At that time, he will be given samples of Lescol 80 mg daily. This is covered by the [employer], which should allow him to receive his medicines for  $12.00 per month. Additionally, he will be on Enalapril 5 mg twice a day and Metoprolol 25 mg one-half twice a day. He will continue his baby aspirin at 81 mg daily. He has been asked to buy Omega 3 fatty acids and take 1 gram per day because of the low HDL. It was stressed that he needs to get the LDL below 100 and HDL over 40. His lipids will be rechecked along with a liver profile after 8-10 weeks. He will see Dr. Antoine Poche in several weeks and re-discuss the recommendations for bypass surgery, which he declines to pursue at this time. Dictated by:   Titus Dubin. Alwyn Ren, M.D. [facility] Attending Physician:  Dorena Cookey DD:  04/28/02 TD:  05/01/02 Job: [id number]
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
## 2011-05-07 NOTE — H&P (Signed)
NAME:  MARCIA, LEPERA NO.:  [id number]   MEDICAL RECORD NO.:  [id number]          PATIENT TYPE:  EMS   LOCATION:  ED                           FACILITY:  [facility]   PHYSICIAN:  Barbette Hair. Artist Pais, DO      DATE OF BIRTH:  [birth year]   DATE OF ADMISSION:  11/05/2006  DATE OF DISCHARGE:                                HISTORY & PHYSICAL   PRIMARY CARE PHYSICIAN:  Marga Melnick, M.D.   CHIEF COMPLAINT:  Right leg pain and redness.   HISTORY OF PRESENT ILLNESS:  The patient is a 77 year old white male with  past medical history of coronary artery disease status post CABG and  hypertension, who presents with worsening redness and swelling of his right  lower extremity. The patient states that his symptoms started on Monday  morning. However, due to his wife recently being in the hospital for a  nephrectomy, he did not seek medical attention up to this point. The patient  noted his right leg symptoms worsening over the last 2 days and family  member became concerned when his right calf became tight and developed  blotches of redness behind his calf. The patient also describes flu-like  symptoms over this 3 to 5 day time period. He denies any chest pain or  shortness of breath but complains of intermittent headache bi-frontal.   REVIEW OF SYSTEMS:  As noted above. No chest pain. No shortness of breath.  No abdominal pain. No diarrhea and no constipation. The patient denies any  history of recent surgery. No bleeding disorder. No melena, blood in stools,  and all other systems negative.   PAST MEDICAL HISTORY:  1. Coronary artery disease status post CABG x4 in 2003.  2. Hypertension.  3. BPH.  4. History of diverticulosis.  5. History of colon polyps.  6. Hyperlipidemia.  7. CVA by CT scan.   SOCIAL HISTORY:  The patient is married and has 2 children. No history of  tobacco. No alcohol. He is retired from [employer].   FAMILY HISTORY:  Positive for father  with myocardial infarction, deceased at  age 85. Mother had a stroke at age 54.   CURRENT MEDICATIONS:  1. Fluvastatin 80 mg p.o. q.h.s.  2. Metoprolol 50 mg 1/2 p.o. b.i.d.  3. Enalapril 10 mg once daily.  4. Niaspan 500 mg p.o. q.h.s.  5. Buspirone 15 mg 1/2 t.i.d. p.r.n.  6. Aspirin p.r.n.   ALLERGIES:  NO KNOWN DRUG ALLERGIES.   LABORATORY/DIAGNOSTIC STUDIES:  CBC showed white blood cell count of 8.4.  Hemoglobin and hematocrit 13.5 and 39.0. Platelet count of 127,000. D-dimer  was elevated at 0.95. B-met showed sodium of 135, potassium 4.3, chloride  104, CO2 27, BUN 19, creatinine of 1.0, and a sugar of 113.   PHYSICAL EXAMINATION:  VITAL SIGNS:  Temperature 97.2, blood pressure  156/75, pulse 72, respiratory rate 20. The patient is 98% on room air.  GENERAL:  The patient is a very pleasant well developed, well nourished 77-  year-old white male. Alert, awake and oriented times three.  HEENT:  Normocephalic  and atraumatic. Pupils are equal, round, and reactive  to light bilaterally. Extraocular muscles intact. The patient was anicteric.  Conjunctivae was within normal limits. Moist mucous membranes. Oropharyngeal  examination benign.  NECK:  Supple. No adenopathy. No carotid bruit.  CHEST:  Normal inspiratory effort. Clear to auscultation bilaterally. No  rhonchi, rales, or wheezes.  CARDIOVASCULAR:  Regular rate and rhythm. No significant murmur, rub, or  gallop appreciated.  ABDOMEN:  Soft, nontender. Positive bowel sounds. No organomegaly.  EXTREMITIES:  The patient had right lower extremity erythema and calf  tenderness with positive pitting edema up to his knee and lymphangitic  streaking up to his thigh.   IMPRESSION:  1. Right lower extremity cellulitis.  2. Right lower extremity edema, rule out  deep vein thrombosis.  3. Thrombocytopenia.  4. History of coronary artery disease status post coronary artery bypass      grafting in 2003.  5. Hypertension.  6.  History of diverticulosis.  7. Dyslipidemia.   RECOMMENDATIONS:  The patient will be admitted for IV antibiotics. Blood  cultures were already obtained in the emergency room and the patient was  given dose of Ancef. Due to the possibility that he may have a DVT, we will  start empiric Lovenox 1 mg per kg until a Doppler can be completed. PT/INR  will be ordered and we will monitor his thrombocytopenia. Etiology unclear  at this time.      Barbette Hair. Artist Pais, DO  Electronically Signed     RDY/MEDQ  D:  11/05/2006  T:  11/05/2006  Job:  [id number]   cc:   Titus Dubin. Alwyn Ren, MD,FACP,FCCP  [phone] [place]  [place] [address]

## 2011-05-07 NOTE — Cardiovascular Report (Signed)
[name]  Patient:    Victor Waters, Victor Waters Visit Number: [id number] MRN: [id number]          Service Type: MED Location: [phone] Attending Physician:  Dorena Cookey Dictated by:   Rollene Rotunda, M.D. [facility] Proc. Date: 04/27/02 Admit Date:  04/25/2002 Discharge Date: 04/28/2002   CC:         Titus Dubin. Alwyn Ren, M.D. [facility]   Cardiac Catheterization  DATE OF BIRTH:  Nov 11, 1926  PRIMARY CARE PHYSICIAN:  Luby Seamans. Alwyn Ren, M.D. [facility]  PROCEDURES PERFORMED:  Left heart catheterization/coronary arteriography.  INDICATIONS:  To evaluate a patient with chest pain and a probable out of hospital myocardial infarction.  DESCRIPTION OF PROCEDURE:  The left heart catheterization is performed via the right femoral artery. The artery was cannulated using anterior wall puncture. #6 French arterial sheath was inserted via the modified Seldinger technique. Preformed Judkins and a pigtail were utilized. The patient tolerated the procedure well and left the lab in stable condition.  RESULTS:  HEMODYNAMIC DATA: 1. Left ventricular pressure 169/9 mmHg. 2. Aortic pressure 162/73 mmHg.  CORONARY ARTERIOGRAPHY: 1. The left main coronary artery was normal. 2. The left anterior descending coronary artery had a mid 60-70%    stenosis immediately after the takeoff of a large branching    first septal perforator. There was mid 25% stenosis, a large    first diagonal had an ostial 40% stenosis. The vessels were    quite tortuous. 3. The circumflex coronary artery essentially consisted of a large    mid obtuse marginal. There was 30% stenosis at the takeoff of    this mid obtuse marginal. 4. The right coronary artery was a dominant vessel and was a very    large vessel. There was a PDA and at least three posterolateral    branches off of this that were all large in caliber. The RCA    was occluded in the proximal segment.  LEFT VENTRICULOGRAPHY: 1. The  left ventriculogram was obtained in the RAO projection. 2. The EF was approximately 50% with moderate inferior akinesis.  CONCLUSION:  Severe single vessel right coronary artery disease with at least moderate left anterior descending artery stenosis.  PLAN:  This is a difficult decision. The patient does not have ischemia in the distribution of the LAD. Any symptoms he is having might just be related to the occluded right coronary and LV dysfunction; however, the LAD is a significant appearing lesion. I will discuss with the patient and his wife the benefits of CABG in this situation. Dictated by:   Rollene Rotunda, M.D. [facility] Attending Physician:  Dorena Cookey DD:  04/27/02 TD:  04/30/02 Job: [id number]

## 2011-05-07 NOTE — Op Note (Signed)
. [facility]  Patient:    Victor Waters, FEBO Visit Number: [id number] MRN: [id number]          Service Type: SUR Location: 2000 2008 01 Attending Physician:  Waldo Laine Dictated by:   Gwenith Daily Tyrone Sage, M.D. Proc. Date: 05/18/02 Admit Date:  05/18/2002   CC:         Rollene Rotunda, M.D. [facility]   Operative Report  PREOPERATIVE DIAGNOSIS:  Coronary occlusive disease.  POSTOPERATIVE DIAGNOSIS:  Coronary occlusive disease.  PROCEDURE:  Coronary artery bypass grafting x4 with the left internal mammary artery to the left anterior descending coronary artery, reversed saphenous vein graft to the diagonal coronary artery, reversed saphenous vein graft to the circumflex coronary artery, and reversed saphenous vein graft to the posterior descending coronary artery.  SURGEON:  Gwenith Daily. Tyrone Sage, M.D.  FIRST ASSISTANT:  Gina H. Collins, P.A.-C.  BRIEF HISTORY:  The patient is a 77 year old male who presented with vague cardiac symptoms and was evaluated and found to have had a large out-of-hospital inferior myocardial infarction.  Cardiac catheterization done by Rollene Rotunda, M.D., revealed 70-80% LAD lesion, 70% diagonal lesion, 60% circumflex lesion, total occlusion of the right coronary artery.  Because of the patients symptoms and three-vessel coronary artery disease, coronary artery bypass grafting was recommended to the patient, who agreed and signed informed consent.  DESCRIPTION OF PROCEDURE:  With Swan-Ganz and arterial line monitors in place, the patient underwent general endotracheal anesthesia without incident.  Skin of the chest and legs was prepped with Betadine and draped in the usual sterile manner.  Vein was harvested initially from the right lower extremity. After one segment the vein branched and became small in caliber.  Additional vein was harvested from the left lower extremity and was of excellent quality and  caliber.  Median sternotomy was performed.  Left internal mammary artery was dissected down as a pedicle graft and the distal artery divided.  It had good, free flow.  The pericardium was opened.  Overall ventricular function appeared preserved with the exception of some hypokinesis in the inferior wall.  Compared to the ventriculogram, more of the inferior wall appeared viable than anticipated.  The patient was systemically heparinized.  The ascending aorta and the right atrium were cannulated.  An aortic root vent cardioplegia needle was introduced into the ascending aorta.  The patient was placed on cardiopulmonary bypass 2.4 L/min. per sq. m.  Sites of anastomosis were selected and dissected out of the epicardium.  The patients body temperature was cooled to 30 degrees, the aortic crossclamp was applied, and 500 cc of cold blood potassium cardioplegia was administered with rapid diastolic arrest of the heart.  Myocardial septal temperature was monitored throughout the crossclamp period.  Attention was turned first to the distal right coronary artery, which was thickened but just at the takeoff of the posterior descending coronary artery, the vessel was opened and was a good-quality vessel.  A 1.5 mm probe passed easily.  Using a running 7-0 Prolene, distal anastomosis was performed.  Attention was then turned to the circumflex coronary artery, which was opened and was a good-quality vessel, 1.5 mm size.  Using a running 7-0 Prolene, distal anastomosis was performed. Attention was then turned to the diagonal coronary artery, which was a smaller vessel but did admit a 1.5 mm probe.  Using the running 7-0 Prolene, distal anastomosis was performed.  Attention was then turned to the left anterior descending coronary artery, which was opened and  admitted a 1.5 mm probe. Using running 8-0 Prolene, the left internal mammary artery was anastomosed to the left anterior descending coronary artery.   With release of the Edwards bulldog on the mammary artery, there was appropriate rise in myocardial septal temperature.  Aortic crossclamp was removed with total crossclamp time of 49 minutes.  The patient spontaneously converted to a sinus rhythm.  A partial occlusion clamp was placed on the ascending aorta.  Three punch aortotomies were performed, and each of the three vein grafts were anastomosed to the ascending aorta, air was evacuated from the grafts.  The partial occlusion clamp was removed.  Sites of anastomosis were inspected and were free of bleeding.  The patient was then ventilated and weaned from cardiopulmonary bypass without difficulty and remained hemodynamically stable.  He was decannulated in the usual fashion.  Protamine sulfate was administered with the operative field hemostatic.  Two atrial and two ventricular pacing wires were applied, graft markers applied.  A left pleural and two mediastinal tubes were left in place.  The sternum was closed with #6 stainless steel wire, the fascia closed with interrupted 0 Vicryl, running 3-0 Vicryl in the subcutaneous tissue, and 4-0 subcuticular stitch in the skin edges.  Dry dressings were applied.  The sponge and needle count was reported as correct at the completion of the procedure.  The patient tolerated the procedure without obvious complication and was transferred to the surgical intensive care unit for further postoperative care. Dictated by:   Gwenith Daily Tyrone Sage, M.D. Attending Physician:  Waldo Laine DD:  05/21/02 TD:  05/22/02 Job: [id number]

## 2011-05-07 NOTE — Discharge Summary (Signed)
[place]  Patient:    Victor Waters, Victor Waters Visit Number: [id number] MRN: [id number]          Service Type: SUR Location: 2000 2008 01 Attending Physician:  Waldo Laine Dictated by:   Tollie Pizza Collins, P.A.-C. Admit Date:  05/18/2002 Discharge Date: 05/23/2002   CC:         Titus Dubin. Alwyn Ren, M.D. [facility]  Rollene Rotunda, M.D. [facility]   Discharge Summary  PRIMARY ADMISSION DIAGNOSES 1. Severe coronary artery disease. 2. Status post recent myocardial infarction.  ADDITIONAL DIAGNOSES 1. Hypertension. 2. Benign prostatic hypertrophy. 3. History of diverticulosis. 4. History of colon polyps. 5. History of hyperlipidemia. 6. History of old cerebrovascular accident by head CT scan.  PROCEDURES PERFORMED: Coronary artery bypass grafting x 4 (left internal mammary artery to the LAD, saphenous vein graft to the diagonal, saphenous vein graft to the right coronary artery, saphenous vein graft to the circumflex coronary).  HISTORY OF PRESENT ILLNESS: The patient is a 77 year old white male with no prior cardiac history. He was admitted to [facility] on Apr 25, 2002, after experiencing an episode of flushing with weakness and nausea. He was admitted by Dr. Alwyn Ren and was ultimately seen by Dr. Antoine Poche. He underwent cardiac catheterization which showed proximal 60% to 70% circumflex lesion with a 70% LAD lesion. There was total occlusion of the right coronary artery. In addition he had 90% right renal artery stenosis and a 50% to 60% left renal artery stenosis. He was discharged to home and followed up in the CVTS office with Dr. Tyrone Sage. After reviewing the films and examining the patient, it was determined that he would be a good candidate for surgical revascularization.  HOSPITAL COURSE: He was admitted on May 18, 2002, and taken to the operating room where he underwent a CABG x 4 with the above noted grafts. He tolerated the  procedure well and was transferred to the ICU in stable condition. Postoperatively he has done well. He was extubated shortly after surgery. He was hemodynamically stable on postoperative day #1 and was later that day transferred to the floor. He has been ambulating in the halls with cardiac rehabilitation phase 1. He has been weaned off supplemental oxygen and is maintaining oxygen saturations of 93% to 94% on room air. He is back down to his preoperative weight. He has remained afebrile and all vital signs have been stable. His incisions are healing well. It is felt that if continues to remain stable, he will be ready for discharge to home on May 23, 2002.  DISCHARGE MEDICATIONS 1. Enteric coated aspirin 325 mg p.o. q.d. 2. Lopressor 25 mg b.i.d. 3. Vasotec 5 mg q.d. 4. Tylox 1 to 2 q.4h. p.r.n. pain.  DISCHARGE INSTRUCTIONS: He is to refrain from driving, heavy lifting or strenuous activity. He should continue daily walking and use of incentive spirometer. He may shower daily and clean his incisions with soap and water.  FOLLOW UP: He is asked to see Dr. Antoine Poche in two weeks and this appointment will be scheduled and noted on the pink discharge instructions sheet. He will have a chest x-ray at that time. He will then follow up with Dr. Jaynie Collins on June 14, 2002, at 9:40 a.m. and is asked to bring his chest x-ray to this appointment. Dictated by:   Tollie Pizza Collins, P.A.-C. Attending Physician:  Waldo Laine DD:  05/22/02 TD:  05/23/02 Job: [id number]

## 2011-05-07 NOTE — Assessment & Plan Note (Signed)
[facility]                                   ON-CALL NOTE   Waters, Victor                        MRN:          [id number]  DATE:11/05/2006                            DOB:          [birth year]    Phone call comes from his wife Victor Waters, [phone].  The patient of Dr. Alwyn Ren.  Phone call came at 6:20 p.m. on November 05, 2006.   [name] was seen in an Urgent Care, I guess, and diagnosed with a DVT.  He  is being sent to the hospital and she wanted to let me know that that was  happening.  He is being driven there by others.  I guess they sent him out  of the medical clinic and just sent him home and told him to go to the  hospital apparently but he is going there.  Someone is bringing him and she  was just letting me know.     Karie Schwalbe, MD  Electronically Signed    RIL/MedQ  DD: 11/05/2006  DT: 11/05/2006  Job #: [id number]   cc:   Titus Dubin. Alwyn Ren, MD,FACP,FCCP

## 2011-05-07 NOTE — Cardiovascular Report (Signed)
Victor Waters. [facility]  Patient:    OAKLEN, THIAM Visit Number: [id number] MRN: [id number]          Service Type: MED Location: [phone] Attending Physician:  Dorena Cookey Dictated by:   Rollene Rotunda, M.D. [facility] Proc. Date: 04/27/02 Admit Date:  04/25/2002 Discharge Date: 04/28/2002   CC:         Titus Dubin. Alwyn Ren, M.D. [facility]   Cardiac Catheterization  DATE OF BIRTH:  1926-11-23  PRIMARY CARE PHYSICIAN:  Brace Welte. Alwyn Ren, M.D.  PROCEDURE:  Left heart catheterization/coronary arteriography.  CARDIOLOGIST:  Rollene Rotunda, M.D.  INDICATIONS:  Patient with chest pain and a probable out of hospital myocardial infarction.  DESCRIPTION OF PROCEDURE:  Left heart catheterization was performed via right femoral artery.  The artery was cannulated using an arterial wall puncture.  A #6 French arterial sheath was inserted via the modified Seldinger technique. A preformed Judkins and a pigtail catheter were utilized.  The patient tolerated the procedure well and left the lab in stable condition.  RESULTS:  HEMODYNAMICS:  LV 169/9, AO 162/73.  CORONARY ARTERIOGRAPHY:  The left main was normal.  The left anterior descending had mid 60-70% stenosis immediately after the takeoff of the large branch inferoseptal perforator.  There was mid 25% stenosis.  A large first diagonal had ostial 40% stenosis.  The vessels were quite tortuous.  The circumflex essentially consisted of a large mid obtuse marginal.  There was 30% stenosis at the takeoff of this mid obtuse marginal. The right coronary artery was dominant and was a very large vessel.  There was a PDA in at least three posterior lateral branches off of this that were all large in caliber.  The RCA was occluded in the proximal segment.  LEFT VENTRICULOGRAM:  The left ventriculogram was obtained in the RAO projection.  The EF was approximately 50% with moderate inferior akinesis.  CONCLUSION:  Severe single vessel right coronary disease with at least moderate left anterior descending stenosis.  PLAN:  This was a difficult decision.  The patient does not have ischemia in the distribution of the LAD.  Any symptoms he is having might just be related to the occluded right coronary and LV dysfunction.  However, the LAD is a significant appearing lesion.  I will discuss with the patient and his wife the benefits of CABG in this situation. Dictated by:   Rollene Rotunda, M.D. [facility] Attending Physician:  Dorena Cookey DD:  04/27/02 TD:  04/30/02 Job: [id number]

## 2011-05-09 ENCOUNTER — Other Ambulatory Visit: Payer: Self-pay | Admitting: Internal Medicine

## 2011-06-16 ENCOUNTER — Other Ambulatory Visit: Payer: Self-pay | Admitting: Internal Medicine

## 2011-07-06 ENCOUNTER — Encounter: Payer: Self-pay | Admitting: Internal Medicine

## 2011-08-06 ENCOUNTER — Other Ambulatory Visit: Payer: Self-pay | Admitting: Internal Medicine

## 2011-08-10 ENCOUNTER — Other Ambulatory Visit: Payer: Self-pay | Admitting: Internal Medicine

## 2011-08-30 ENCOUNTER — Other Ambulatory Visit: Payer: Self-pay

## 2011-08-30 MED ORDER — ZOSTER VACCINE LIVE 19400 UNT/0.65ML ~~LOC~~ SOLR: 0.6500 mL | Freq: Once | SUBCUTANEOUS | Status: DC

## 2011-08-30 NOTE — Telephone Encounter (Signed)
RX sent

## 2011-09-01 ENCOUNTER — Telehealth: Payer: Self-pay | Admitting: *Deleted

## 2011-09-01 NOTE — Telephone Encounter (Signed)
Patient requesting Referral to be ordered for Evaluation at [facility] [address] Sherian Waters [patient has appointment 09/15/11 @10 :30am] Phone #[phone] Fax #[phone]

## 2011-09-01 NOTE — Telephone Encounter (Signed)
Referral placed, will send to [facility] to complete referral process. Encounter ok to be signed once referral process complete

## 2011-09-09 LAB — URINALYSIS, ROUTINE W REFLEX MICROSCOPIC
Glucose, UA: NEGATIVE
Hgb urine dipstick: NEGATIVE
Protein, ur: NEGATIVE
Specific Gravity, Urine: 1.013
Urobilinogen, UA: 0.2

## 2011-09-09 LAB — CBC
HCT: 41.7
Hemoglobin: 14.6
RDW: 13.3
WBC: 4.6

## 2011-09-09 LAB — COMPREHENSIVE METABOLIC PANEL
ALT: 32
Albumin: 3.3 — ABNORMAL LOW
Alkaline Phosphatase: 48
BUN: 14
Chloride: 106
Glucose, Bld: 91
Potassium: 3.9
Sodium: 139
Total Bilirubin: 1
Total Protein: 5.5 — ABNORMAL LOW

## 2011-09-09 LAB — DIFFERENTIAL
Basophils Absolute: 0
Basophils Relative: 0
Eosinophils Absolute: 0.1
Monocytes Absolute: 0.5
Monocytes Relative: 12
Neutro Abs: 3.4
Neutrophils Relative %: 74

## 2011-09-09 LAB — POCT CARDIAC MARKERS
Myoglobin, poc: 83
Operator id: 4295
Troponin i, poc: <0.05

## 2011-09-15 ENCOUNTER — Telehealth: Payer: Self-pay | Admitting: *Deleted

## 2011-09-15 NOTE — Telephone Encounter (Signed)
Received telephone call from Dr Jenean Lindau re pt's eval of ringing in his ears and hearing loss.  He is having a mixed hearing loss and Dr Jenean Lindau wants to make sure that Dr Antoine Poche is OK with her sending the pt to an ENT for eval.  Her phone number is [phone].  Will verify with MD and let her know of approval.

## 2011-09-15 NOTE — Telephone Encounter (Signed)
OK with me.

## 2011-09-16 NOTE — Telephone Encounter (Signed)
Left message on voice mail of OK to refer to ENT

## 2011-10-28 ENCOUNTER — Other Ambulatory Visit: Payer: Self-pay | Admitting: Internal Medicine

## 2011-11-25 ENCOUNTER — Other Ambulatory Visit: Payer: Self-pay | Admitting: Internal Medicine

## 2012-01-03 ENCOUNTER — Other Ambulatory Visit: Payer: Self-pay | Admitting: Internal Medicine

## 2012-01-04 ENCOUNTER — Telehealth: Payer: Self-pay | Admitting: *Deleted

## 2012-01-04 ENCOUNTER — Encounter: Payer: Self-pay | Admitting: Family Medicine

## 2012-01-04 ENCOUNTER — Ambulatory Visit (INDEPENDENT_AMBULATORY_CARE_PROVIDER_SITE_OTHER): Payer: Medicare PPO | Admitting: Family Medicine

## 2012-01-04 ENCOUNTER — Encounter (INDEPENDENT_AMBULATORY_CARE_PROVIDER_SITE_OTHER): Payer: Medicare PPO | Admitting: *Deleted

## 2012-01-04 ENCOUNTER — Other Ambulatory Visit: Payer: Self-pay | Admitting: *Deleted

## 2012-01-04 VITALS — BP 110/80 | HR 74 | Temp 97.8°F | Ht 63.0 in | Wt 150.8 lb

## 2012-01-04 DIAGNOSIS — R229 Localized swelling, mass and lump, unspecified: Secondary | ICD-10-CM | POA: Insufficient documentation

## 2012-01-04 DIAGNOSIS — M79609 Pain in unspecified limb: Secondary | ICD-10-CM

## 2012-01-04 DIAGNOSIS — M7989 Other specified soft tissue disorders: Secondary | ICD-10-CM

## 2012-01-04 MED ORDER — CARVEDILOL 3.125 MG PO TABS: 3.1250 mg | ORAL_TABLET | Freq: Two times a day (BID) | ORAL | Status: DC

## 2012-01-04 MED ORDER — AMOXICILLIN 500 MG PO CAPS: 500.0000 mg | ORAL_CAPSULE | Freq: Three times a day (TID) | ORAL | Status: AC

## 2012-01-04 NOTE — Telephone Encounter (Signed)
Called [facility] to ask about results for pt per noted he came in office at 1pm today 01-04-12, rep advised that this was a preliminary reading that is why no results have been posted in chart as of yet, gave rep our fax number and currently waiting on results

## 2012-01-04 NOTE — Progress Notes (Signed)
  Subjective:    Patient ID: Victor Waters, male    DOB: May 24, 1926, 78 y.o.   MRN: [id number]  HPI 'knot in L leg'- has been having leg cramps x2 months.  Now having leg 'pain and cramps' while sitting in the car and watching TV.  Lump noticed x2 weeks.  Not painful.  Some intermittent swelling of leg.   Review of Systems For ROS see HPI     Objective:   Physical Exam  Vitals reviewed. Constitutional: He appears well-developed and well-nourished. No distress.  Musculoskeletal: He exhibits no edema and no tenderness.       L cord like mass running from lateral mid shin to lower shin, not painful          Assessment & Plan:

## 2012-01-04 NOTE — Telephone Encounter (Signed)
See Dr Rennis Golden  plan. As there was no clot; she recommended  F/U in  the Coumadin clinic. These were the  instructions she gave him; she indicated he seemed to understand. Amox 500 mg tid #21 if not allergic

## 2012-01-04 NOTE — Telephone Encounter (Signed)
Since there is no clot there is no reason to f/u in coumadin clinic- this was only discussed if pt had clot present to avoid ER and hospitalization to start anticoag.

## 2012-01-04 NOTE — Patient Instructions (Signed)
We'll notify you of your ultrasound results Apply heat to lower leg for swelling Call with any questions or concerns Hang in there!!!

## 2012-01-04 NOTE — Telephone Encounter (Signed)
Received fax from [facility], gave to MD Lowne to review and noted pt has not clot/dvt per results from LVE noted today, called pt to advise not clot noted, pt understood. Will send information to MD Alwyn Ren and MD Beverely Low

## 2012-01-04 NOTE — Telephone Encounter (Signed)
Spoke with pt to advise updated order for pt to start Amox 500 mg tid #21 pt not to be allergic, sent to [facility] on [address] per pt clarification noted. Will speak with MD Tabori concerning the coumadin clinic advice, sent ABT for pt. Pt understood

## 2012-01-04 NOTE — Assessment & Plan Note (Signed)
Given cord like feel to L lower leg must r/o DVT.  Get venous doppler.  If clot present will refer to coumadin clinic since Dr Antoine Poche is cards.  Pt expressed understanding and is in agreement w/ plan.

## 2012-02-07 ENCOUNTER — Other Ambulatory Visit: Payer: Self-pay | Admitting: Internal Medicine

## 2012-02-07 NOTE — Telephone Encounter (Signed)
I called and spoke with patient's wife to verify patient is taking this med and if yes who is the prescribing MD on the bottle for med is not on patient's medication list. Patient 's wife stated she will check his pill bottles and call back

## 2012-02-07 NOTE — Telephone Encounter (Signed)
OK X1, R X 2 

## 2012-02-07 NOTE — Telephone Encounter (Signed)
Patient's wife called back and stated she checked pill bottle, med was rx'ed by Dr.Hopper on 12/10/11. I double checked med list, med not listed. (I also checked [facility])  Dr.Hopper please advise if ok to add med to list and refill

## 2012-03-20 ENCOUNTER — Other Ambulatory Visit: Payer: Self-pay

## 2012-03-20 MED ORDER — FUROSEMIDE 40 MG PO TABS: 20.0000 mg | ORAL_TABLET | ORAL | Status: DC | PRN

## 2012-03-24 ENCOUNTER — Other Ambulatory Visit: Payer: Self-pay | Admitting: *Deleted

## 2012-03-24 MED ORDER — LISINOPRIL 10 MG PO TABS: 10.0000 mg | ORAL_TABLET | Freq: Every day | ORAL | Status: DC

## 2012-03-29 ENCOUNTER — Other Ambulatory Visit: Payer: Self-pay | Admitting: Internal Medicine

## 2012-04-13 ENCOUNTER — Ambulatory Visit: Payer: Medicare PPO | Admitting: Cardiology

## 2012-05-09 ENCOUNTER — Ambulatory Visit (INDEPENDENT_AMBULATORY_CARE_PROVIDER_SITE_OTHER): Payer: Medicare PPO | Admitting: Cardiology

## 2012-05-09 ENCOUNTER — Encounter: Payer: Self-pay | Admitting: Cardiology

## 2012-05-09 VITALS — BP 128/75 | HR 80 | Resp 18 | Ht 62.0 in | Wt 151.0 lb

## 2012-05-09 DIAGNOSIS — I10 Essential (primary) hypertension: Secondary | ICD-10-CM

## 2012-05-09 DIAGNOSIS — R0600 Dyspnea, unspecified: Secondary | ICD-10-CM

## 2012-05-09 DIAGNOSIS — E785 Hyperlipidemia, unspecified: Secondary | ICD-10-CM

## 2012-05-09 DIAGNOSIS — R5383 Other fatigue: Secondary | ICD-10-CM

## 2012-05-09 DIAGNOSIS — R0609 Other forms of dyspnea: Secondary | ICD-10-CM

## 2012-05-09 DIAGNOSIS — I2510 Atherosclerotic heart disease of native coronary artery without angina pectoris: Secondary | ICD-10-CM

## 2012-05-09 DIAGNOSIS — R5381 Other malaise: Secondary | ICD-10-CM

## 2012-05-09 LAB — CBC WITH DIFFERENTIAL/PLATELET
Basophils Absolute: 0 10*3/uL (ref 0.0–0.1)
Eosinophils Absolute: 0.1 10*3/uL (ref 0.0–0.7)
HCT: 43.2 % (ref 39.0–52.0)
Hemoglobin: 14.5 g/dL (ref 13.0–17.0)
Lymphs Abs: 0.7 10*3/uL (ref 0.7–4.0)
MCHC: 33.6 g/dL (ref 30.0–36.0)
Monocytes Absolute: 0.8 10*3/uL (ref 0.1–1.0)
Neutro Abs: 3.4 10*3/uL (ref 1.4–7.7)
Platelets: 115 10*3/uL — ABNORMAL LOW (ref 150.0–400.0)
RDW: 14.2 % (ref 11.5–14.6)

## 2012-05-09 NOTE — Patient Instructions (Signed)
The current medical regimen is effective;  continue present plan and medications.  Please have blood work today (TSH, CBC)  Your physician has requested that you have a lexiscan myoview. For further information please visit [email or web address] Please follow instruction sheet, as given.  Follow up in 1 year with Dr Antoine Poche.  You will receive a letter in the mail 2 months before you are due.  Please call us when you receive this letter to schedule your follow up appointment.

## 2012-05-09 NOTE — Assessment & Plan Note (Addendum)
I will check a CBC and TSH.  If his labs and stress test are normal I will plan to hold his statin to see if symptoms improve.

## 2012-05-09 NOTE — Assessment & Plan Note (Signed)
He can get a lipid profile when he returns for his stress test.

## 2012-05-09 NOTE — Assessment & Plan Note (Signed)
The blood pressure is at target. No change in medications is indicated. We will continue with therapeutic lifestyle changes (TLC).  

## 2012-05-09 NOTE — Progress Notes (Signed)
HPI The patient presents for yearly followup. Since I last saw him he has had a somewhat sudden decline in his exercise tolerance. He has had one episode of chest discomfort several days ago for which he took a nitroglycerin. This happened after moving a chair. He otherwise has simply not had the energy that he had previously. He's fatigued and didn't want to put out a garden this year.  He is able to feed his chickens and do some minimal activity but he fatigues more easily. He's not getting any new shortness of breath. He denies any PND or orthopnea. He is not having any chest pressure, neck or arm discomfort. He's had no weight gain or edema.  Allergies  Allergen Reactions  . Niacin     Current Outpatient Prescriptions  Medication Sig Dispense Refill  . ALPRAZolam (XANAX) 0.5 MG tablet take 1/2 to 1 tablet by mouth three times a day if needed for anxiety and take 1 to 1 and 1/2 tablet by mouth at bedtime if needed for insom  41 tablet  0  . aspirin 81 MG tablet Take 81 mg by mouth daily.        . carvedilol (COREG) 3.125 MG tablet Take 1 tablet (3.125 mg total) by mouth 2 (two) times daily with a meal.  60 tablet  6  . CRESTOR 40 MG tablet take 1/2 tablet by mouth once daily  45 tablet  3  . Doxylamine Succinate, Sleep, (SLEEP AID PO) Take by mouth at bedtime as needed.        . furosemide (LASIX) 40 MG tablet Take 0.5 tablets (20 mg total) by mouth as needed.  30 tablet  4  . HYDROcodone-acetaminophen (NORCO) 5-325 MG per tablet       . KLOR-CON M10 10 MEQ tablet take 1 tablet by mouth once daily  30 tablet  5  . lisinopril (PRINIVIL,ZESTRIL) 10 MG tablet Take 1 tablet (10 mg total) by mouth daily.  30 tablet  4  . NITROSTAT 0.4 MG SL tablet DISSOLVE 1 TAB UNDER TONGUE AS NEEDED FOR CHEST PAIN, MAY REPEAT IN 5 MINUTES AS DIRECTED. ER IF NO BETTER.  25 tablet  11  . Omega-3 Fatty Acids (FISH OIL) 1000 MG CAPS Take 1 capsule by mouth daily.        . sertraline (ZOLOFT) 25 MG tablet take  1/2 tablet by mouth once daily as directed  30 tablet  2  . Tamsulosin HCl (FLOMAX) 0.4 MG CAPS take 1 capsule by mouth once daily  90 capsule  2  . ZETIA 10 MG tablet take 1/2 tablet by mouth once daily  45 tablet  3  . zoster vaccine live, PF, (ZOSTAVAX) 21308 UNT/0.65ML injection Inject 19,400 Units into the skin once.  1 vial  0    Past Medical History  Diagnosis Date  . Myocardial infarction 2003    hx of   . Hyperlipidemia   . Anxiety   . Fatigue   . Insomnia   . Coronary artery disease   . Renal artery stenosis     bilateral    Past Surgical History  Procedure Date  . Cardiac catheterization 2003    revdeling severe three-vessel disease  . Coronary artery bypass graft     LIMA to LAD, SVG to diagonal, SVG to RCA, and SVG to circumflex  . Polypectomy     colon  . Renal artery stent 01-28-10    ROS:  Tinitis, leg weakness.  Otherwise  as stated in the HPI and negative for all other systems.  PHYSICAL EXAM BP 128/75  Pulse 80  Resp 18  Ht 5\' 2"  (1.575 m)  Wt 151 lb (68.493 kg)  BMI 27.62 kg/m2 GENERAL:  Well appearing HEENT:  Pupils equal round and reactive, fundi not visualized, oral mucosa unremarkable NECK:  No jugular venous distention, waveform within normal limits, carotid upstroke brisk and symmetric, no bruits, no thyromegaly LYMPHATICS:  No cervical, inguinal adenopathy LUNGS:  Clear to auscultation bilaterally BACK:  No CVA tenderness CHEST:  Well healed sternotomy scar. HEART:  PMI not displaced or sustained,S1 and S2 within normal limits, no S3, no S4, no clicks, no rubs, no murmurs ABD:  Flat, positive bowel sounds normal in frequency in pitch, no bruits, no rebound, no guarding, no midline pulsatile mass, no hepatomegaly, no splenomegaly EXT:  2 plus pulses throughout, right lower extremity edema and calf tenderness without cord or mass, no cyanosis no clubbing SKIN:  No rashes no nodules NEURO:  Cranial nerves II through XII grossly intact, motor  grossly intact throughout PSYCH:  Cognitively intact, oriented to person place and time  EKG:  Sinus rhythm, rate 80, axis within normal limits, intervals within normal limits, no acute ST-T wave changes.  ASSESSMENT AND PLAN

## 2012-05-09 NOTE — Assessment & Plan Note (Signed)
I am concerned that his symptoms could be related to his now 78 year old bypass grafts. I will plan a stress test so he does not think he could walk on a treadmill. Therefore, he will need a Lexiscan Myoview.

## 2012-05-10 ENCOUNTER — Telehealth: Payer: Self-pay | Admitting: Cardiology

## 2012-05-10 NOTE — Telephone Encounter (Signed)
Pt's wife calling re needing [place] to get auth for pt's procedure at the hospital, when looking at his appts I saw a stress test here, so I asked her if it was the stress test she was referring to, and she told me to listen to her and just do what she says, I tried to tell her if it was the stress test, which I finally determined it was, it was being taken care of by our pre cert dept, while trying to tell her this however, she kept telling me to stop talking, and just do what she's telling me to do, so I just took the number she gave me, and when I tried to confirm it she said she didn't know if she could or not, so as I was trying to repeat the number, she started telling me I don't know how to talk to people and she wanted my name to report me, I gave her my name and told her she can call Sue Lush on 05-16-12 if she wanted too.

## 2012-05-17 ENCOUNTER — Other Ambulatory Visit: Payer: Self-pay | Admitting: *Deleted

## 2012-05-17 ENCOUNTER — Other Ambulatory Visit (INDEPENDENT_AMBULATORY_CARE_PROVIDER_SITE_OTHER): Payer: Medicare PPO

## 2012-05-17 ENCOUNTER — Ambulatory Visit (HOSPITAL_COMMUNITY): Payer: Medicare PPO | Attending: Cardiology | Admitting: Radiology

## 2012-05-17 VITALS — Ht 62.0 in | Wt 151.0 lb

## 2012-05-17 DIAGNOSIS — I701 Atherosclerosis of renal artery: Secondary | ICD-10-CM

## 2012-05-17 DIAGNOSIS — R5383 Other fatigue: Secondary | ICD-10-CM

## 2012-05-17 DIAGNOSIS — R0609 Other forms of dyspnea: Secondary | ICD-10-CM | POA: Insufficient documentation

## 2012-05-17 DIAGNOSIS — I739 Peripheral vascular disease, unspecified: Secondary | ICD-10-CM | POA: Insufficient documentation

## 2012-05-17 DIAGNOSIS — E782 Mixed hyperlipidemia: Secondary | ICD-10-CM

## 2012-05-17 DIAGNOSIS — Z951 Presence of aortocoronary bypass graft: Secondary | ICD-10-CM | POA: Insufficient documentation

## 2012-05-17 DIAGNOSIS — E785 Hyperlipidemia, unspecified: Secondary | ICD-10-CM | POA: Insufficient documentation

## 2012-05-17 DIAGNOSIS — R079 Chest pain, unspecified: Secondary | ICD-10-CM | POA: Insufficient documentation

## 2012-05-17 DIAGNOSIS — R0989 Other specified symptoms and signs involving the circulatory and respiratory systems: Secondary | ICD-10-CM | POA: Insufficient documentation

## 2012-05-17 DIAGNOSIS — I10 Essential (primary) hypertension: Secondary | ICD-10-CM | POA: Insufficient documentation

## 2012-05-17 DIAGNOSIS — E7800 Pure hypercholesterolemia, unspecified: Secondary | ICD-10-CM

## 2012-05-17 DIAGNOSIS — R5381 Other malaise: Secondary | ICD-10-CM | POA: Insufficient documentation

## 2012-05-17 DIAGNOSIS — I252 Old myocardial infarction: Secondary | ICD-10-CM | POA: Insufficient documentation

## 2012-05-17 DIAGNOSIS — I2510 Atherosclerotic heart disease of native coronary artery without angina pectoris: Secondary | ICD-10-CM

## 2012-05-17 DIAGNOSIS — R002 Palpitations: Secondary | ICD-10-CM | POA: Insufficient documentation

## 2012-05-17 DIAGNOSIS — R42 Dizziness and giddiness: Secondary | ICD-10-CM | POA: Insufficient documentation

## 2012-05-17 DIAGNOSIS — R0602 Shortness of breath: Secondary | ICD-10-CM

## 2012-05-17 LAB — LIPID PANEL: Cholesterol: 125 mg/dL (ref 0–200)

## 2012-05-17 MED ORDER — TECHNETIUM TC 99M TETROFOSMIN IV KIT
30.0000 | PACK | Freq: Once | INTRAVENOUS | Status: AC | PRN
  Administered 2012-05-17: 30 via INTRAVENOUS

## 2012-05-17 MED ORDER — REGADENOSON 0.4 MG/5ML IV SOLN
0.4000 mg | Freq: Once | INTRAVENOUS | Status: AC
  Administered 2012-05-17: 0.4 mg via INTRAVENOUS

## 2012-05-17 MED ORDER — TECHNETIUM TC 99M TETROFOSMIN IV KIT
10.0000 | PACK | Freq: Once | INTRAVENOUS | Status: AC | PRN
  Administered 2012-05-17: 10 via INTRAVENOUS

## 2012-05-17 NOTE — Progress Notes (Signed)
[facility] SITE 3 NUCLEAR MED [address] [place] [address] [phone]  Cardiology Nuclear Med Study  Victor Waters is a 78 y.o. male     MRN : [id number]     DOB: Oct 04, 1926  Procedure Date: 05/17/2012  Nuclear Med Background Indication for Stress Test:  Evaluation for Ischemia and Graft Patency History:  '03 Myocardial Infarction treated> Cath> CABG x 3,'09 Myocardial Perfusion Study-(-) ischemia, EF=58%, '10 Echo: EF=55-60% Cardiac Risk Factors: Hypertension, Lipids and PVD  Symptoms: Chest Pain with/without exertion (last occurrence 2-3 weeks ago),  Dizziness, DOE, Fatigue, Fatigue with Exertion, Light-Headedness and Palpitations   Nuclear Pre-Procedure Caffeine/Decaff Intake:  None NPO After: 6pm   Lungs:  clear O2 Sat: 96% on room air. IV 0.9% NS with Angio Cath:  20g  IV Site: R Antecubital  IV Started by:  Bonnita Levan, RN  Chest Size (in):  44 Cup Size: n/a  Height: 5\' 2"  (1.575 m)  Weight:  151 lb (68.493 kg)  BMI:  Body mass index is 27.62 kg/(m^2). Tech Comments:  Took Coreg this AM, Lipids drawn    Nuclear Med Study 1 or 2 day study: 1 day  Stress Test Type:  Lexiscan  Reading MD: Marca Ancona, MD  Order Authorizing Provider:  Rollene Rotunda, MD  Resting Radionuclide: Technetium 42m Tetrofosmin  Resting Radionuclide Dose: 11.0 mCi   Stress Radionuclide:  Technetium 60m Tetrofosmin  Stress Radionuclide Dose: 33.0 mCi           Stress Protocol Rest HR: 77 Stress HR: 104  Rest BP: 142/70 Stress BP: 139/46  Exercise Time (min): n/a METS: n/a   Predicted Max HR: 136 bpm % Max HR: 76.47 bpm Rate Pressure Product: 28413   Dose of Adenosine (mg):  n/a Dose of Lexiscan: 0.4 mg  Dose of Atropine (mg): n/a Dose of Dobutamine: n/a mcg/kg/min (at max HR)  Stress Test Technologist: Irean Hong, RN  Nuclear Technologist:  Domenic Polite, CNMT     Rest Procedure:  Myocardial perfusion imaging was performed at rest 45 minutes following  the intravenous administration of Technetium 11m Tetrofosmin. Rest ECG: NSR with PVC  Stress Procedure:  The patient received IV Lexiscan 0.4 mg over 15-seconds.  Technetium 59m Tetrofosmin injected at 30-seconds.  There were T wave changes with Lexiscan. There were frequent PVC's. Quantitative spect images were obtained after a 45 minute delay. Stress ECG: No significant change from baseline ECG  QPS Raw Data Images:  Normal; no motion artifact; normal heart/lung ratio. Stress Images:  Small, mild basal inferior perfusion defect.  Rest Images:  Small, mild basal inferior perfusion defect.  Subtraction (SDS):  Fixed small, mild basal inferior perfusion defect.  Transient Ischemic Dilatation (Normal <1.22):  1.05 Lung/Heart Ratio (Normal <0.45):  0.32  Quantitative Gated Spect Images QGS EDV:  88 ml QGS ESV:  46 ml  Impression Exercise Capacity:  Lexiscan with no exercise. BP Response:  Normal blood pressure response. Clinical Symptoms:  Warmth, lightheadedness.  ECG Impression:  No significant ST segment change suggestive of ischemia. Comparison with Prior Nuclear Study: EF was higher on prior study and fixed basal inferior defect was not mentioned.   Overall Impression:  Low risk stress nuclear study.  Small, mild fixed basal inferior perfusion defect may indicate prior infarction given basal inferior hypokinesis.   LV Ejection Fraction: 48%.  LV Wall Motion:  Basal inferior hypokinesis.  Would consider echo to reassess EF.   [name] [employer]

## 2012-05-22 ENCOUNTER — Other Ambulatory Visit: Payer: Self-pay | Admitting: Internal Medicine

## 2012-05-23 NOTE — Progress Notes (Signed)
Addended by: Reine Just on: 05/23/2012 07:36 PM   Modules accepted: Orders

## 2012-05-25 ENCOUNTER — Other Ambulatory Visit: Payer: Self-pay | Admitting: *Deleted

## 2012-05-25 DIAGNOSIS — R9430 Abnormal result of cardiovascular function study, unspecified: Secondary | ICD-10-CM

## 2012-05-31 ENCOUNTER — Ambulatory Visit (HOSPITAL_COMMUNITY): Payer: Medicare PPO | Attending: Cardiovascular Disease | Admitting: Radiology

## 2012-05-31 DIAGNOSIS — E785 Hyperlipidemia, unspecified: Secondary | ICD-10-CM | POA: Insufficient documentation

## 2012-05-31 DIAGNOSIS — I252 Old myocardial infarction: Secondary | ICD-10-CM | POA: Insufficient documentation

## 2012-05-31 DIAGNOSIS — R5381 Other malaise: Secondary | ICD-10-CM | POA: Insufficient documentation

## 2012-05-31 DIAGNOSIS — R9430 Abnormal result of cardiovascular function study, unspecified: Secondary | ICD-10-CM | POA: Insufficient documentation

## 2012-05-31 DIAGNOSIS — R0989 Other specified symptoms and signs involving the circulatory and respiratory systems: Secondary | ICD-10-CM | POA: Insufficient documentation

## 2012-05-31 DIAGNOSIS — R0609 Other forms of dyspnea: Secondary | ICD-10-CM | POA: Insufficient documentation

## 2012-05-31 DIAGNOSIS — I2510 Atherosclerotic heart disease of native coronary artery without angina pectoris: Secondary | ICD-10-CM | POA: Insufficient documentation

## 2012-05-31 DIAGNOSIS — R5383 Other fatigue: Secondary | ICD-10-CM | POA: Insufficient documentation

## 2012-05-31 DIAGNOSIS — I10 Essential (primary) hypertension: Secondary | ICD-10-CM | POA: Insufficient documentation

## 2012-05-31 NOTE — Progress Notes (Signed)
Echocardiogram performed.  

## 2012-06-03 ENCOUNTER — Other Ambulatory Visit: Payer: Self-pay | Admitting: Internal Medicine

## 2012-06-05 NOTE — Telephone Encounter (Signed)
OK X1 ; take as infrequently as possible, not regularly . May affect balance & alertness with increased risk for falls

## 2012-06-05 NOTE — Telephone Encounter (Signed)
Last OV 01-04-12 acute, labs filled 03-29-12 #[phone]

## 2012-07-12 ENCOUNTER — Other Ambulatory Visit: Payer: Self-pay | Admitting: Internal Medicine

## 2012-07-13 NOTE — Telephone Encounter (Signed)
R/X phoned in.

## 2012-07-21 ENCOUNTER — Other Ambulatory Visit: Payer: Self-pay | Admitting: Internal Medicine

## 2012-08-22 ENCOUNTER — Telehealth: Payer: Self-pay | Admitting: Internal Medicine

## 2012-09-01 ENCOUNTER — Other Ambulatory Visit: Payer: Self-pay | Admitting: Internal Medicine

## 2012-09-06 NOTE — Telephone Encounter (Signed)
No comment

## 2012-09-19 DIAGNOSIS — E871 Hypo-osmolality and hyponatremia: Secondary | ICD-10-CM

## 2012-09-19 HISTORY — DX: Hypo-osmolality and hyponatremia: E87.1

## 2012-09-29 ENCOUNTER — Emergency Department (HOSPITAL_COMMUNITY): Payer: Medicare PPO

## 2012-09-29 ENCOUNTER — Encounter (HOSPITAL_COMMUNITY): Payer: Self-pay | Admitting: Emergency Medicine

## 2012-09-29 ENCOUNTER — Observation Stay (HOSPITAL_COMMUNITY)
Admission: EM | Admit: 2012-09-29 | Discharge: 2012-10-02 | Disposition: A | Discharge status: Home / Self Care | Payer: Medicare PPO | Attending: Internal Medicine | Admitting: Internal Medicine

## 2012-09-29 DIAGNOSIS — R42 Dizziness and giddiness: Secondary | ICD-10-CM

## 2012-09-29 DIAGNOSIS — G319 Degenerative disease of nervous system, unspecified: Secondary | ICD-10-CM | POA: Insufficient documentation

## 2012-09-29 DIAGNOSIS — F329 Major depressive disorder, single episode, unspecified: Secondary | ICD-10-CM

## 2012-09-29 DIAGNOSIS — I150 Renovascular hypertension: Secondary | ICD-10-CM | POA: Insufficient documentation

## 2012-09-29 DIAGNOSIS — I959 Hypotension, unspecified: Secondary | ICD-10-CM

## 2012-09-29 DIAGNOSIS — D696 Thrombocytopenia, unspecified: Secondary | ICD-10-CM

## 2012-09-29 DIAGNOSIS — R229 Localized swelling, mass and lump, unspecified: Secondary | ICD-10-CM

## 2012-09-29 DIAGNOSIS — E785 Hyperlipidemia, unspecified: Secondary | ICD-10-CM

## 2012-09-29 DIAGNOSIS — I701 Atherosclerosis of renal artery: Secondary | ICD-10-CM

## 2012-09-29 DIAGNOSIS — R5383 Other fatigue: Secondary | ICD-10-CM

## 2012-09-29 DIAGNOSIS — Z79899 Other long term (current) drug therapy: Secondary | ICD-10-CM | POA: Insufficient documentation

## 2012-09-29 DIAGNOSIS — R0602 Shortness of breath: Secondary | ICD-10-CM

## 2012-09-29 DIAGNOSIS — H9319 Tinnitus, unspecified ear: Principal | ICD-10-CM

## 2012-09-29 DIAGNOSIS — E782 Mixed hyperlipidemia: Secondary | ICD-10-CM

## 2012-09-29 DIAGNOSIS — F3289 Other specified depressive episodes: Secondary | ICD-10-CM

## 2012-09-29 DIAGNOSIS — I951 Orthostatic hypotension: Secondary | ICD-10-CM

## 2012-09-29 DIAGNOSIS — E871 Hypo-osmolality and hyponatremia: Secondary | ICD-10-CM

## 2012-09-29 DIAGNOSIS — R51 Headache: Secondary | ICD-10-CM | POA: Insufficient documentation

## 2012-09-29 DIAGNOSIS — I2510 Atherosclerotic heart disease of native coronary artery without angina pectoris: Secondary | ICD-10-CM

## 2012-09-29 DIAGNOSIS — N259 Disorder resulting from impaired renal tubular function, unspecified: Secondary | ICD-10-CM

## 2012-09-29 DIAGNOSIS — R5381 Other malaise: Secondary | ICD-10-CM

## 2012-09-29 DIAGNOSIS — I498 Other specified cardiac arrhythmias: Secondary | ICD-10-CM

## 2012-09-29 DIAGNOSIS — Z87448 Personal history of other diseases of urinary system: Secondary | ICD-10-CM

## 2012-09-29 DIAGNOSIS — G4700 Insomnia, unspecified: Secondary | ICD-10-CM

## 2012-09-29 DIAGNOSIS — D72819 Decreased white blood cell count, unspecified: Secondary | ICD-10-CM

## 2012-09-29 DIAGNOSIS — Z9181 History of falling: Secondary | ICD-10-CM | POA: Insufficient documentation

## 2012-09-29 DIAGNOSIS — F411 Generalized anxiety disorder: Secondary | ICD-10-CM

## 2012-09-29 DIAGNOSIS — Z951 Presence of aortocoronary bypass graft: Secondary | ICD-10-CM | POA: Insufficient documentation

## 2012-09-29 DIAGNOSIS — I252 Old myocardial infarction: Secondary | ICD-10-CM | POA: Insufficient documentation

## 2012-09-29 DIAGNOSIS — I10 Essential (primary) hypertension: Secondary | ICD-10-CM

## 2012-09-29 LAB — CBC WITH DIFFERENTIAL/PLATELET
Basophils Relative: 0 % (ref 0–1)
Eosinophils Absolute: 0 10*3/uL (ref 0.0–0.7)
Eosinophils Relative: 1 % (ref 0–5)
HCT: 40.1 % (ref 39.0–52.0)
Hemoglobin: 14.1 g/dL (ref 13.0–17.0)
Lymphs Abs: 0.9 10*3/uL (ref 0.7–4.0)
MCH: 30.3 pg (ref 26.0–34.0)
MCHC: 35.2 g/dL (ref 30.0–36.0)
MCV: 86.1 fL (ref 78.0–100.0)
Monocytes Absolute: 0.9 10*3/uL (ref 0.1–1.0)
Monocytes Relative: 11 % (ref 3–12)
RBC: 4.66 MIL/uL (ref 4.22–5.81)

## 2012-09-29 LAB — GLUCOSE, CAPILLARY: Glucose-Capillary: 112 mg/dL — ABNORMAL HIGH (ref 70–99)

## 2012-09-29 LAB — COMPREHENSIVE METABOLIC PANEL
Alkaline Phosphatase: 60 U/L (ref 39–117)
BUN: 21 mg/dL (ref 6–23)
Creatinine, Ser: 0.89 mg/dL (ref 0.50–1.35)
GFR calc Af Amer: 89 mL/min — ABNORMAL LOW (ref >90)
Glucose, Bld: 115 mg/dL — ABNORMAL HIGH (ref 70–99)
Potassium: 3.8 mEq/L (ref 3.5–5.1)
Total Protein: 6.6 g/dL (ref 6.0–8.3)

## 2012-09-29 LAB — TROPONIN I: Troponin I: <0.3 ng/mL (ref <0.30)

## 2012-09-29 MED ORDER — ACETAMINOPHEN 325 MG PO TABS
650.0000 mg | ORAL_TABLET | Freq: Once | ORAL | Status: AC
Start: 2012-09-29 — End: 2012-09-29
  Administered 2012-09-29: 650 mg via ORAL
  Filled 2012-09-29: qty 2

## 2012-09-29 MED ORDER — LORAZEPAM 0.5 MG PO TABS
0.5000 mg | ORAL_TABLET | Freq: Once | ORAL | Status: AC
  Administered 2012-09-29: 0.5 mg via ORAL
  Filled 2012-09-29: qty 1

## 2012-09-29 MED ORDER — SODIUM CHLORIDE 0.9 % IV BOLUS (SEPSIS)
500.0000 mL | Freq: Once | INTRAVENOUS | Status: AC
  Administered 2012-09-29: 500 mL via INTRAVENOUS

## 2012-09-29 MED ORDER — SODIUM CHLORIDE 0.9 % IV SOLN
Freq: Once | INTRAVENOUS | Status: AC
  Administered 2012-09-29: 23:00:00 via INTRAVENOUS

## 2012-09-29 MED ORDER — SODIUM CHLORIDE 0.9 % IV SOLN: INTRAVENOUS | Status: DC

## 2012-09-29 NOTE — ED Notes (Signed)
Pt c/o dizziness that started last night. Positive for nausea, negative for vomiting. Pt reports blood pressure was fluctuating last night (165/60 highest). Medications include lisinopril, crestor, tamsulosin, zetra, carvedilol, furosemide, setraline, and potassium. Pt couldn't sleep, reports headache, denies LOC. Pt reports eating only small lunch at 1200.

## 2012-09-29 NOTE — ED Notes (Signed)
Pt transported to CT ?

## 2012-09-29 NOTE — ED Provider Notes (Signed)
History     CSN: [id number]  Arrival date & time 09/29/12  7829   First MD Initiated Contact with Patient 09/29/12 2051      Chief Complaint  Patient presents with  . Dizziness    (Consider location/radiation/quality/duration/timing/severity/associated sxs/prior treatment) Patient is a 78 y.o. male presenting with weakness. The history is provided by the patient and a relative.  Weakness The primary symptoms include headaches and dizziness. Primary symptoms do not include syncope, loss of consciousness, speech change, fever or vomiting.  Dizziness does not occur with vomiting.  Associated symptoms comments: He complains of dizziness for the past 2-3 days bu tis unable to fully discern lightheaded vs. room-spinning. No syncope or near syncope. No fever, nausea, change in appetite. Per family member at bedside, he complains frequently of headache and "has been under significant stress this week and has not been himself." He denies chest pain..    Past Medical History  Diagnosis Date  . Myocardial infarction 2003    hx of   . Hyperlipidemia   . Anxiety   . Fatigue   . Insomnia   . Coronary artery disease   . Renal artery stenosis     bilateral    Past Surgical History  Procedure Date  . Cardiac catheterization 2003    revdeling severe three-vessel disease  . Coronary artery bypass graft     LIMA to LAD, SVG to diagonal, SVG to RCA, and SVG to circumflex  . Polypectomy     colon  . Renal artery stent 01-28-10    Family History  Problem Relation Age of Onset  . Diabetes Mother   . Stroke Mother   . Heart attack Father 5    died  . Colon cancer Other     paternal grandfather    History  Substance Use Topics  . Smoking status: Never Smoker   . Smokeless tobacco: Not on file  . Alcohol Use: No      Review of Systems  Constitutional: Negative for fever and appetite change.  Eyes: Negative for visual disturbance.  Respiratory: Negative for cough and shortness  of breath.   Cardiovascular: Negative for chest pain and syncope.  Gastrointestinal: Positive for abdominal pain. Negative for vomiting.  Genitourinary: Negative for dysuria.  Musculoskeletal: Negative for myalgias and arthralgias.  Skin: Negative for pallor.  Neurological: Positive for dizziness and headaches. Negative for speech change, loss of consciousness and speech difficulty.  Psychiatric/Behavioral: Negative for confusion.    Allergies  Niacin  Home Medications   Current Outpatient Rx  Name Route Sig Dispense Refill  . ALPRAZOLAM 0.5 MG PO TABS Oral Take 0.25 mg by mouth 3 times/day as needed-between meals & bedtime. For anxiety.    . ASPIRIN 81 MG PO TABS Oral Take 81 mg by mouth daily.      Marland Kitchen CARVEDILOL 3.125 MG PO TABS Oral Take 3.125 mg by mouth 2 (two) times daily with a meal.    . EZETIMIBE 10 MG PO TABS Oral Take 5 mg by mouth daily.    . FUROSEMIDE 40 MG PO TABS Oral Take 20 mg by mouth daily as needed. Fluid retention.    Marland Kitchen LISINOPRIL 10 MG PO TABS Oral Take 10 mg by mouth daily.    Marland Kitchen MELATONIN 3 MG PO TBDP Oral Take 1 tablet by mouth at bedtime as needed. For sleep.    Marland Kitchen NITROSTAT 0.4 MG SL SUBL  DISSOLVE 1 TAB UNDER TONGUE AS NEEDED FOR CHEST PAIN, MAY REPEAT  IN 5 MINUTES AS DIRECTED. ER IF NO BETTER. 25 tablet 11  . FISH OIL 1000 MG PO CAPS Oral Take 1 capsule by mouth daily.      Marland Kitchen POTASSIUM CHLORIDE CRYS ER 10 MEQ PO TBCR      . ROSUVASTATIN CALCIUM 40 MG PO TABS Oral Take 20 mg by mouth daily.    . SERTRALINE HCL 25 MG PO TABS Oral Take 12.5 mg by mouth daily.    Marland Kitchen TAMSULOSIN HCL 0.4 MG PO CAPS  take 1 capsule by mouth once daily 90 capsule 2    BP 109/61  Pulse 89  Temp 98 F (36.7 C) (Oral)  Resp 16  SpO2 99%  Physical Exam  Constitutional: He is oriented to person, place, and time. He appears well-developed and well-nourished.  HENT:  Head: Normocephalic and atraumatic.  Mouth/Throat: Mucous membranes are dry.  Eyes: EOM are normal. Pupils are  equal, round, and reactive to light.       No conjunctival pallor.  Neck: Normal range of motion.  Cardiovascular: Normal rate and regular rhythm.   No murmur heard. Pulmonary/Chest: Effort normal and breath sounds normal. He has no wheezes. He has no rales.  Abdominal: Soft. There is no tenderness.  Neurological: He is alert and oriented to person, place, and time. He has normal strength and normal reflexes. No sensory deficit. He displays a negative Romberg sign. Coordination normal.       Speech is clear and articulate. Ambulatory with observed steady gait.   Skin: Skin is warm and dry.  Psychiatric: He has a normal mood and affect.    ED Course  Procedures (including critical care time)  Labs Reviewed  GLUCOSE, CAPILLARY - Abnormal; Notable for the following:    Glucose-Capillary 112 (*)     All other components within normal limits  CBC WITH DIFFERENTIAL - Abnormal; Notable for the following:    Platelets 145 (*)     Neutrophils Relative 78 (*)     Lymphocytes Relative 11 (*)     All other components within normal limits  COMPREHENSIVE METABOLIC PANEL - Abnormal; Notable for the following:    Sodium 124 (*)     Chloride 88 (*)     Glucose, Bld 115 (*)     GFR calc non Af Amer 76 (*)     GFR calc Af Amer 89 (*)     All other components within normal limits  TROPONIN I   Dg Chest 2 View  09/29/2012  *RADIOLOGY REPORT*  Clinical Data: Dizziness, cough  CHEST - 2 VIEW  Comparison: 07/06/2010  Findings: Lungs are essentially clear.  No pleural effusion or pneumothorax.  Cardiomediastinal silhouette is within normal limits. Postsurgical changes related to prior CABG.  Mild degenerative changes of the visualized thoracolumbar spine.  IMPRESSION: No evidence of acute cardiopulmonary disease.   Original Report Authenticated By: Charline Bills, M.D.    Ct Head Wo Contrast  09/29/2012  *RADIOLOGY REPORT*  Clinical Data: Dizziness  CT HEAD WITHOUT CONTRAST  Technique:  Contiguous  axial images were obtained from the base of the skull through the vertex without contrast.  Comparison: 01/19/2008  Findings: No evidence of parenchymal hemorrhage or extra-axial fluid collection. No mass lesion, mass effect, or midline shift.  No CT evidence of acute infarction.  Mild subcortical white matter and periventricular small vessel ischemic changes. Old left basal ganglia lacunar infarct.  The visualized paranasal sinuses are essentially clear. The mastoid air cells are unopacified.  No  evidence of calvarial fracture.  IMPRESSION: No evidence of acute intracranial abnormality.  Mild small vessel ischemic changes.   Original Report Authenticated By: Charline Bills, M.D.      No diagnosis found. 1. Hyponatremia    MDM  Patient with nonfocal neuro exam, describes dizziness as "not thinking clearly". No positional changes - not c/w vertigo. Na significantly low at 124. Will admit.         Rodena Medin, PA-C 09/29/12 2324

## 2012-09-29 NOTE — ED Notes (Signed)
Jamse Mead, daughter, [phone].

## 2012-09-29 NOTE — ED Notes (Signed)
Pt denied trauma or fall. Pt denied new medications. Pt reports hx of MI.

## 2012-09-29 NOTE — ED Notes (Signed)
PA at bedside.

## 2012-09-29 NOTE — ED Notes (Signed)
Pt alert, arrives from home, c/o insomnia, dizziness, onset several days ago, resp even unlabored, skin pwd, ambulates to triage, steady gait noted

## 2012-09-29 NOTE — H&P (Signed)
PCP:   Marga Melnick, MD   Chief Complaint:  dizzy  HPI: 78 yo male comes in with several days of dizziness that is worsen upon standing or bending over.  Denies any fevers/tinnitis/n/v/d.  Says he eats and drinks well.  No slurred speech or any other focal neuro def.  Has had a lot of stress and anxiety lately due to president obama trying to turn [place] into [place]lam[place].  Denies any visual or auditory hallucinations.  Has not been able to sleep well lately either due to his worries.  Denies cough/edema/dysuria.  Review of Systems:  O/w neg  Past Medical History: Past Medical History  Diagnosis Date  . Myocardial infarction 2003    hx of   . Hyperlipidemia   . Anxiety   . Fatigue   . Insomnia   . Coronary artery disease   . Renal artery stenosis     bilateral   Past Surgical History  Procedure Date  . Cardiac catheterization 2003    revdeling severe three-vessel disease  . Coronary artery bypass graft     LIMA to LAD, SVG to diagonal, SVG to RCA, and SVG to circumflex  . Polypectomy     colon  . Renal artery stent 01-28-10    Medications: Prior to Admission medications   Medication Sig Start Date End Date Taking? Authorizing Provider  ALPRAZolam Prudy Feeler) 0.5 MG tablet Take 0.25 mg by mouth 3 times/day as needed-between meals & bedtime. For anxiety.   Yes Historical Provider, MD  aspirin 81 MG tablet Take 81 mg by mouth daily.     Yes Historical Provider, MD  carvedilol (COREG) 3.125 MG tablet Take 3.125 mg by mouth 2 (two) times daily with a meal. 01/04/12  Yes Rollene Rotunda, MD  ezetimibe (ZETIA) 10 MG tablet Take 5 mg by mouth daily.   Yes Historical Provider, MD  furosemide (LASIX) 40 MG tablet Take 20 mg by mouth daily as needed. Fluid retention. 03/20/12  Yes Kathleene Hazel, MD  lisinopril (PRINIVIL,ZESTRIL) 10 MG tablet Take 10 mg by mouth daily. 03/24/12  Yes Rollene Rotunda, MD  Melatonin 3 MG TBDP Take 1 tablet by mouth at bedtime as needed. For  sleep.   Yes Historical Provider, MD  NITROSTAT 0.4 MG SL tablet DISSOLVE 1 TAB UNDER TONGUE AS NEEDED FOR CHEST PAIN, MAY REPEAT IN 5 MINUTES AS DIRECTED. ER IF NO BETTER. 01/03/12  Yes Pecola Lawless, MD  Omega-3 Fatty Acids (FISH OIL) 1000 MG CAPS Take 1 capsule by mouth daily.     Yes Historical Provider, MD  potassium chloride (KLOR-CON M10) 10 MEQ tablet  10/28/11  Yes Pecola Lawless, MD  rosuvastatin (CRESTOR) 40 MG tablet Take 20 mg by mouth daily.   Yes Historical Provider, MD  sertraline (ZOLOFT) 25 MG tablet Take 12.5 mg by mouth daily.   Yes Historical Provider, MD  Tamsulosin HCl (FLOMAX) 0.4 MG CAPS take 1 capsule by mouth once daily 01/03/12  Yes Pecola Lawless, MD    Allergies:   Allergies  Allergen Reactions  . Niacin Other (See Comments)    Burning sensations in head    Social History:  reports that he has never smoked. He does not have any smokeless tobacco history on file. He reports that he does not drink alcohol or use illicit drugs.  Family History: Family History  Problem Relation Age of Onset  . Diabetes Mother   . Stroke Mother   . Heart attack Father  86    died  . Colon cancer Other     paternal grandfather    Physical Exam: Filed Vitals:   09/29/12 2016 09/29/12 2017 09/29/12 2019 09/29/12 2348  BP: 149/74 146/75 109/61 129/69  Pulse: 72 82 89 69  Temp:      TempSrc:      Resp:    14  SpO2:    97%   General appearance: alert, cooperative and no distress  MMM dry Lungs: clear to auscultation bilaterally Heart: regular rate and rhythm, S1, S2 normal, no murmur, click, rub or gallop Abdomen: soft, non-tender; bowel sounds normal; no masses,  no organomegaly Extremities: extremities normal, atraumatic, no cyanosis or edema Pulses: 2+ and symmetric Skin: Skin color, texture, turgor normal. No rashes or lesions Neurologic: Grossly normal  Labs on Admission:   [facility] 09/29/12 2015  NA 124*  K 3.8  CL 88*  CO2 24  GLUCOSE 115*  BUN 21   CREATININE 0.89  CALCIUM 9.1  MG --  PHOS --    Basename 09/29/12 2015  AST 20  ALT 17  ALKPHOS 60  BILITOT 0.9  PROT 6.6  ALBUMIN 3.8    Basename 09/29/12 2015  WBC 8.1  NEUTROABS 6.3  HGB 14.1  HCT 40.1  MCV 86.1  PLT 145*    Basename 09/29/12 2030  CKTOTAL --  CKMB --  CKMBINDEX --  TROPONINI <0.30   Radiological Exams on Admission: Dg Chest 2 View  09/29/2012  *RADIOLOGY REPORT*  Clinical Data: Dizziness, cough  CHEST - 2 VIEW  Comparison: 07/06/2010  Findings: Lungs are essentially clear.  No pleural effusion or pneumothorax.  Cardiomediastinal silhouette is within normal limits. Postsurgical changes related to prior CABG.  Mild degenerative changes of the visualized thoracolumbar spine.  IMPRESSION: No evidence of acute cardiopulmonary disease.   Original Report Authenticated By: Charline Bills, M.D.    Ct Head Wo Contrast  09/29/2012  *RADIOLOGY REPORT*  Clinical Data: Dizziness  CT HEAD WITHOUT CONTRAST  Technique:  Contiguous axial images were obtained from the base of the skull through the vertex without contrast.  Comparison: 01/19/2008  Findings: No evidence of parenchymal hemorrhage or extra-axial fluid collection. No mass lesion, mass effect, or midline shift.  No CT evidence of acute infarction.  Mild subcortical white matter and periventricular small vessel ischemic changes. Old left basal ganglia lacunar infarct.  The visualized paranasal sinuses are essentially clear. The mastoid air cells are unopacified.  No evidence of calvarial fracture.  IMPRESSION: No evidence of acute intracranial abnormality.  Mild small vessel ischemic changes.   Original Report Authenticated By: Charline Bills, M.D.     Assessment/Plan Present on Admission:  78 yo male with dizziness and hyponatremia .TINNITUS, CHRONIC .DIZZINESS .Hyponatremia .HYPERTENSION, ESSENTIAL NOS .Cor Athrscl-Uns Vessel  Not certain his hyponatremia is causing his dizziness.  Orthostatics and  12 lead ekg normal.  ua pending.  Dizziness is totally resolved now with some ivf in ED.  Will obs overnigth, monitor on tele, has had normal echo recently and lexiscan which showed mild dec EF and mild wall abnormality.  Has h/o bradycardia in past but none here, but will monitor this also for cause of his dizziness without syncopal episodes.  No focal neuro def at this time and will ck neuro evals frequently overnight.  Hold off on his lasix for now and he is a little dry.  Repeat na in am.    Temari Schooler [phone] 09/29/2012, 11:53 PM

## 2012-09-30 DIAGNOSIS — I959 Hypotension, unspecified: Secondary | ICD-10-CM

## 2012-09-30 DIAGNOSIS — I951 Orthostatic hypotension: Secondary | ICD-10-CM

## 2012-09-30 DIAGNOSIS — H9319 Tinnitus, unspecified ear: Principal | ICD-10-CM

## 2012-09-30 DIAGNOSIS — I2510 Atherosclerotic heart disease of native coronary artery without angina pectoris: Secondary | ICD-10-CM

## 2012-09-30 LAB — URINALYSIS, ROUTINE W REFLEX MICROSCOPIC
Bilirubin Urine: NEGATIVE
Ketones, ur: NEGATIVE mg/dL
Leukocytes, UA: NEGATIVE
Nitrite: NEGATIVE
Specific Gravity, Urine: 1.016 (ref 1.005–1.030)
Urobilinogen, UA: 0.2 mg/dL (ref 0.0–1.0)

## 2012-09-30 LAB — BASIC METABOLIC PANEL
CO2: 27 mEq/L (ref 19–32)
Calcium: 9 mg/dL (ref 8.4–10.5)
Glucose, Bld: 98 mg/dL (ref 70–99)
Potassium: 3.9 mEq/L (ref 3.5–5.1)
Sodium: 128 mEq/L — ABNORMAL LOW (ref 135–145)

## 2012-09-30 LAB — TROPONIN I: Troponin I: <0.3 ng/mL (ref <0.30)

## 2012-09-30 LAB — CBC
Hemoglobin: 14 g/dL (ref 13.0–17.0)
MCV: 86.3 fL (ref 78.0–100.0)
Platelets: 139 10*3/uL — ABNORMAL LOW (ref 150–400)
RBC: 4.61 MIL/uL (ref 4.22–5.81)
WBC: 4.7 10*3/uL (ref 4.0–10.5)

## 2012-09-30 LAB — TSH: TSH: 1.458 u[IU]/mL (ref 0.350–4.500)

## 2012-09-30 MED ORDER — LISINOPRIL 10 MG PO TABS
10.0000 mg | ORAL_TABLET | Freq: Every day | ORAL | Status: DC
  Filled 2012-09-30: qty 1

## 2012-09-30 MED ORDER — MECLIZINE HCL 25 MG PO TABS
25.0000 mg | ORAL_TABLET | Freq: Two times a day (BID) | ORAL | Status: DC
  Administered 2012-09-30 – 2012-10-02 (×5): 25 mg via ORAL
  Filled 2012-09-30 (×7): qty 1

## 2012-09-30 MED ORDER — ONDANSETRON HCL 4 MG/2ML IJ SOLN: 4.0000 mg | Freq: Four times a day (QID) | INTRAMUSCULAR | Status: DC | PRN

## 2012-09-30 MED ORDER — CARVEDILOL 3.125 MG PO TABS
3.1250 mg | ORAL_TABLET | Freq: Two times a day (BID) | ORAL | Status: DC
  Administered 2012-09-30 (×2): 3.125 mg via ORAL
  Filled 2012-09-30 (×5): qty 1

## 2012-09-30 MED ORDER — EZETIMIBE 10 MG PO TABS
5.0000 mg | ORAL_TABLET | Freq: Every day | ORAL | Status: DC
  Administered 2012-09-30 – 2012-10-02 (×3): 5 mg via ORAL
  Filled 2012-09-30 (×3): qty 0.5

## 2012-09-30 MED ORDER — ONDANSETRON HCL 4 MG PO TABS: 4.0000 mg | ORAL_TABLET | Freq: Four times a day (QID) | ORAL | Status: DC | PRN

## 2012-09-30 MED ORDER — ASPIRIN 81 MG PO CHEW
81.0000 mg | CHEWABLE_TABLET | Freq: Every day | ORAL | Status: DC
  Administered 2012-09-30 – 2012-10-02 (×3): 81 mg via ORAL
  Filled 2012-09-30 (×3): qty 1

## 2012-09-30 MED ORDER — TAMSULOSIN HCL 0.4 MG PO CAPS
0.4000 mg | ORAL_CAPSULE | Freq: Every day | ORAL | Status: DC
  Administered 2012-09-30 – 2012-10-02 (×3): 0.4 mg via ORAL
  Filled 2012-09-30 (×3): qty 1

## 2012-09-30 MED ORDER — LORAZEPAM 0.5 MG PO TABS: 0.5000 mg | ORAL_TABLET | Freq: Once | ORAL | Status: DC

## 2012-09-30 MED ORDER — SERTRALINE HCL 25 MG PO TABS
12.5000 mg | ORAL_TABLET | Freq: Every day | ORAL | Status: DC
  Administered 2012-09-30 – 2012-10-02 (×3): 12.5 mg via ORAL
  Filled 2012-09-30 (×3): qty 0.5

## 2012-09-30 MED ORDER — LORAZEPAM 0.5 MG PO TABS
0.5000 mg | ORAL_TABLET | Freq: Once | ORAL | Status: AC
  Administered 2012-09-30: 0.5 mg via ORAL
  Filled 2012-09-30: qty 1

## 2012-09-30 MED ORDER — SODIUM CHLORIDE 0.9 % IV SOLN
INTRAVENOUS | Status: AC
  Administered 2012-09-30: 09:00:00 via INTRAVENOUS

## 2012-09-30 NOTE — Evaluation (Signed)
Occupational Therapy Evaluation/Vestibular Evaluation Patient Details Name: Victor Waters MRN: [id number] DOB: May 17, 1926 Today's Date: 09/30/2012 Time: 4098-1191 OT Time Calculation (min): 37 min  OT Assessment / Plan / Recommendation Clinical Impression  This 78 year old man was admitted for dizziness (dysequilibrium and lightheadedness), and weakness.  he is appropriate for skilled OT to increase independence with ADLs to reach a supervision level in acute.      OT Assessment  Patient needs continued OT Services    Follow Up Recommendations  Home health OT    Barriers to Discharge      Equipment Recommendations  Other (comment) (wife will get shower seat)    Recommendations for Other Services    Frequency  Min 2X/week    Precautions / Restrictions Precautions Precautions: Fall Restrictions Weight Bearing Restrictions: No   Pertinent Vitals/Pain No pain    ADL  Grooming: Performed;Min guard Where Assessed - Grooming: Supported standing Toilet Transfer: Performed;Minimal assistance (ambulated without device) Toilet Transfer Method: Sit to Barista:  (stood in front of toilet) Toileting - Architect and Hygiene: Min guard Where Assessed - Toileting Clothing Manipulation and Hygiene: Standing Transfers/Ambulation Related to ADLs: Light min A to ambulate to bathroom.  Pt moves slowly with wide BOS but no LOB noted.  Recommended shower seat/grab bar at home.  Wife has 3:1 which she uses at night.    Vestibular eval:  Occulomotor WFLs, VOR WFLs, HT test negative; modified Hallpike Dix negative (performed due to pt's age, but he did not have symptoms).  Pt states tinnitus has been a problem for 15 years but has gotten worse.  His dizziness is described as dysequilibrium not spinning and he only felt lightheaded on Sunday.  Pt has not had recent falls but has had a couple within a couple of years.   No vestibular cause identified (potentially  could have had bilateral hypofunction but not seen during evaluation)   OT Diagnosis: Generalized weakness  OT Problem List: Decreased strength;Decreased activity tolerance;Impaired balance (sitting and/or standing);Decreased knowledge of use of DME or AE OT Treatment Interventions: Self-care/ADL training;DME and/or AE instruction;Balance training;Patient/family education   OT Goals Acute Rehab OT Goals OT Goal Formulation: With patient/family Time For Goal Achievement: 10/14/12 Potential to Achieve Goals: Good ADL Goals Pt Will Transfer to Toilet: with supervision;Ambulation;Regular height toilet ADL Goal: Toilet Transfer - Progress: Goal set today Pt Will Perform Tub/Shower Transfer: Tub transfer;Ambulation;Shower seat with back;Grab bars ADL Goal: Web designer - Progress: Goal set today Miscellaneous OT Goals Miscellaneous OT Goal #1: pt will gather clothes at supervision level with AD OT Goal: Miscellaneous Goal #1 - Progress: Goal set today  Visit Information  Last OT Received On: 09/30/12 Assistance Needed: +1    Subjective Data  Subjective: I just feel off balance--it's not spinning Patient Stated Goal: be able to get back to doing everything he wants to:  very active   Prior Functioning     Home Living Lives With: Spouse Bathroom Shower/Tub: Engineer, manufacturing systems: Standard Home Adaptive Equipment: Bedside commode/3-in-1 Prior Function Level of Independence: Independent Driving:  (wife has been driving lately) Comments: has been holding onto things when walking Communication Communication: No difficulties         Vision/Perception Vision - Assessment Eye Alignment: Within Functional Limits   Cognition  Overall Cognitive Status: Appears within functional limits for tasks assessed/performed Arousal/Alertness: Awake/alert Orientation Level: Appears intact for tasks assessed Behavior During Session: [facility] for tasks performed  Extremity/Trunk Assessment Right Upper Extremity Assessment RUE ROM/Strength/Tone: WFL for tasks assessed Left Upper Extremity Assessment LUE ROM/Strength/Tone: WFL for tasks assessed     Mobility Bed Mobility Bed Mobility: Supine to Sit Supine to Sit: 6: Modified independent (Device/Increase time) Transfers Transfers: Sit to Stand Sit to Stand: 4: Min guard     Shoulder Instructions     Exercise     Balance Balance Balance Assessed: Yes Static Standing Balance Static Standing - Balance Support: No upper extremity supported Static Standing - Level of Assistance: 5: Stand by assistance Static Standing - Comment/# of Minutes: 2   End of Session OT - End of Session Activity Tolerance: Patient tolerated treatment well Patient left: in bed;with call bell/phone within reach;with family/visitor present;with bed alarm set  GO Functional Assessment Tool Used: clinical observation and judgment Functional Limitation: Self care Self Care Current Status (W0981): At least 20 percent but less than 40 percent impaired, limited or restricted Self Care Goal Status (X9147): At least 1 percent but less than 20 percent impaired, limited or restricted   Pearla Mckinny 09/30/2012, 2:42 PM Marica Otter, OTR/L [phone] 09/30/2012

## 2012-09-30 NOTE — Progress Notes (Signed)
Patient ID: Victor Waters  male  ZOX:[id number]    DOB: 1926/06/01    DOA: 09/29/2012  PCP: Marga Melnick, MD  Subjective: Still feels dizzy and weak on standing up. States confusion is clearing up.  Also complaining of tinnitus worse in the last 2 weeks, has been following ENT. ?vertigo  Objective: Weight change:   Intake/Output Summary (Last 24 hours) at 09/30/12 1012 Last data filed at 09/30/12 0600  Gross per 24 hour  Intake 328.33 ml  Output      0 ml  Net 328.33 ml   Blood pressure 102/55, pulse 93, temperature 98.1 F (36.7 C), temperature source Oral, resp. rate 16, height 5\' 4"  (1.626 m), weight 65.59 kg (144 lb 9.6 oz), SpO2 95.00%.  Physical Exam: General: Alert and awake, oriented x3, not in any acute distress. HEENT: anicteric sclera, pupils reactive to light and accommodation, EOMI CVS: S1-S2 clear, no murmur rubs or gallops Chest: clear to auscultation bilaterally, no wheezing, rales or rhonchi Abdomen: soft nontender, nondistended, normal bowel sounds, no organomegaly Extremities: no cyanosis, clubbing or edema noted bilaterally Neuro: Cranial nerves II-XII intact, no focal neurological deficits  Lab Results: Basic Metabolic Panel:  Lab 09/30/12 8119 09/29/12 2015  NA 128* 124*  K 3.9 3.8  CL 92* 88*  CO2 27 24  GLUCOSE 98 115*  BUN 16 21  CREATININE 0.82 0.89  CALCIUM 9.0 9.1  MG -- --  PHOS -- --   Liver Function Tests:  Lab 09/29/12 2015  AST 20  ALT 17  ALKPHOS 60  BILITOT 0.9  PROT 6.6  ALBUMIN 3.8   CBC:  Lab 09/30/12 0500 09/29/12 2015  WBC 4.7 8.1  NEUTROABS -- 6.3  HGB 14.0 14.1  HCT 39.8 40.1  MCV 86.3 86.1  PLT 139* 145*   Cardiac Enzymes:  Lab 09/30/12 0341 09/29/12 2030  CKTOTAL -- --  CKMB -- --  CKMBINDEX -- --  TROPONINI <0.30 <0.30   BNP: No components found with this basename: POCBNP:2 CBG:  Lab 09/29/12 1953  GLUCAP 112*     Micro Results: No results found for this or any previous visit (from the  past 240 hour(s)).  Studies/Results: Dg Chest 2 View  09/29/2012  *RADIOLOGY REPORT*  Clinical Data: Dizziness, cough  CHEST - 2 VIEW  Comparison: 07/06/2010  Findings: Lungs are essentially clear.  No pleural effusion or pneumothorax.  Cardiomediastinal silhouette is within normal limits. Postsurgical changes related to prior CABG.  Mild degenerative changes of the visualized thoracolumbar spine.  IMPRESSION: No evidence of acute cardiopulmonary disease.   Original Report Authenticated By: Charline Bills, M.D.    Ct Head Wo Contrast  09/29/2012  *RADIOLOGY REPORT*  Clinical Data: Dizziness  CT HEAD WITHOUT CONTRAST  Technique:  Contiguous axial images were obtained from the base of the skull through the vertex without contrast.  Comparison: 01/19/2008  Findings: No evidence of parenchymal hemorrhage or extra-axial fluid collection. No mass lesion, mass effect, or midline shift.  No CT evidence of acute infarction.  Mild subcortical white matter and periventricular small vessel ischemic changes. Old left basal ganglia lacunar infarct.  The visualized paranasal sinuses are essentially clear. The mastoid air cells are unopacified.  No evidence of calvarial fracture.  IMPRESSION: No evidence of acute intracranial abnormality.  Mild small vessel ischemic changes.   Original Report Authenticated By: Charline Bills, M.D.     Medications: Scheduled Meds:   . sodium chloride   Intravenous Once  . acetaminophen  650 mg  Oral Once  . aspirin  81 mg Oral Daily  . carvedilol  3.125 mg Oral BID WC  . ezetimibe  5 mg Oral Daily  . LORazepam  0.5 mg Oral Once  . meclizine  25 mg Oral BID  . sertraline  12.5 mg Oral Daily  . sodium chloride  500 mL Intravenous Once  . Tamsulosin HCl  0.4 mg Oral Daily  . DISCONTD: sodium chloride   Intravenous STAT  . DISCONTD: lisinopril  10 mg Oral Daily   Continuous Infusions:   . sodium chloride 100 mL/hr at 09/30/12 4098     Assessment/Plan: Principal  Problem:  *Hyponatremia: calculated osmolarity 270, Na up to 128 today - continue to hold Lasix, lisinopril due to his orthostatic hypotension. Continue gentle hydration today.  Active Problems:  TINNITUS, CHRONIC ? Vertigo - Explained the patient to follow with ENT Outpatient - start meclizine trial, PT/OT for vestibular evaluation   HYPERTENSION, ESSENTIAL NOS: - continue Coreg, hold Lasix and lisinopril for now   Cor Athrscl-Uns Vessel: Stable, no chest pain or shortness of breath, on ASA, statin and BB    DIZZINESS - Still somewhat dizzy on standing up. PT OT eval hopefully DC home in a.m.  DVT Prophylaxis: SCD  Code Status:  Disposition: hopefully tomorrow   LOS: 1 day   Janai Brannigan M.D. [facility] 09/30/2012, 10:12 AM Pager: [phone]  If 7PM-7AM, please contact night-coverage [email or web address] Password [email or web address]

## 2012-09-30 NOTE — ED Provider Notes (Signed)
Medical screening examination/treatment/procedure(s) were conducted as a shared visit with non-physician practitioner(s) and myself.  I personally evaluated the patient during the encounter.. complains of dizziness.  Sodium 124. Admit  Donnetta Hutching, MD 09/30/12 0025

## 2012-09-30 NOTE — Evaluation (Signed)
Physical Therapy Evaluation Patient Details Name: Victor Waters MRN: [id number] DOB: 1926-09-20 Today's Date: 09/30/2012 Time: 9528-4132 PT Time Calculation (min): 28 min  PT Assessment / Plan / Recommendation Clinical Impression  pt doing well, will continue to follow for high level balance defictis    PT Assessment  Patient needs continued PT services    Follow Up Recommendations  Home health PT    Does the patient have the potential to tolerate intense rehabilitation      Barriers to Discharge        Equipment Recommendations  None recommended by PT    Recommendations for Other Services     Frequency Min 3X/week    Precautions / Restrictions Precautions Precautions: Fall Restrictions Weight Bearing Restrictions: No   Pertinent Vitals/Pain       Mobility  Bed Mobility Bed Mobility: Supine to Sit;Sit to Supine Supine to Sit: 6: Modified independent (Device/Increase time) Sit to Supine: 6: Modified independent (Device/Increase time) Transfers Transfers: Sit to Stand;Stand to Sit Sit to Stand: 5: Supervision;From bed Stand to Sit: 5: Supervision;To bed Ambulation/Gait Ambulation/Gait Assistance: 4: Min guard Ambulation Distance (Feet): 350 Feet (X 2) Ambulation/Gait Assistance Details: amb once without RW and then  approximiately 66' with  RW  but required then more cues for safety; pt weaving both with and without assistive device Gait Pattern: Step-through pattern    Shoulder Instructions     Exercises     PT Diagnosis: Difficulty walking  PT Problem List: Decreased strength;Decreased range of motion;Decreased activity tolerance;Decreased balance;Decreased mobility;Decreased knowledge of use of DME PT Treatment Interventions: Gait training;Functional mobility training;Therapeutic activities;Therapeutic exercise;Balance training;Patient/family education   PT Goals Acute Rehab PT Goals PT Goal Formulation: With patient Time For Goal Achievement:  09/30/12 Potential to Achieve Goals: Good Pt will go Sit to Stand: with modified independence PT Goal: Sit to Stand - Progress: Goal set today Pt will go Stand to Sit: with modified independence PT Goal: Stand to Sit - Progress: Goal set today Pt will Ambulate: >150 feet;with modified independence PT Goal: Ambulate - Progress: Goal set today Pt will Perform Home Exercise Program: with supervision, verbal cues required/provided PT Goal: Perform Home Exercise Program - Progress: Goal set today  Visit Information  Last PT Received On: 09/30/12 Assistance Needed: +1    Subjective Data  Subjective: are we going to walk? Patient Stated Goal: home   Prior Functioning  Home Living Lives With: Spouse Available Help at Discharge: Family Type of Home: House Home Access: Stairs to enter Secretary/administrator of Steps: 5 Entrance Stairs-Rails: Right Home Layout: One level Bathroom Shower/Tub: Engineer, manufacturing systems: Standard Home Adaptive Equipment: Bedside commode/3-in-1;Walker - rolling Additional Comments: 2 steps down to back porch fro kitchen Prior Function Level of Independence: Independent Driving:  (wife has been driving lately) Comments: has been holding onto things when walking Communication Communication: No difficulties    Cognition  Overall Cognitive Status: Appears within functional limits for tasks assessed/performed Arousal/Alertness: Awake/alert Orientation Level: Appears intact for tasks assessed Behavior During Session: [facility] for tasks performed    Extremity/Trunk Assessment Right Upper Extremity Assessment RUE ROM/Strength/Tone: [facility] for tasks assessed Left Upper Extremity Assessment LUE ROM/Strength/Tone: [facility] for tasks assessed Right Lower Extremity Assessment RLE ROM/Strength/Tone: [facility] for tasks assessed Left Lower Extremity Assessment LLE ROM/Strength/Tone: [facility] for tasks assessed;Deficits LLE ROM/Strength/Tone Deficits: pt reports his knees and  hips just "feel weak"   Balance Balance Balance Assessed: Yes Static Standing Balance Static Standing - Balance Support: No upper extremity  supported Static Standing - Level of Assistance: 5: Stand by assistance Static Standing - Comment/# of Minutes: 2 High Level Balance High Level Balance Activites: Direction changes;Turns;Sudden stops;Head turns  End of Session PT - End of Session Equipment Utilized During Treatment: Gait belt Activity Tolerance: Patient tolerated treatment well Patient left: with call bell/phone within reach;in bed;with bed alarm set;with family/visitor present  GP     [facility] 09/30/2012, 4:11 PM

## 2012-09-30 NOTE — Progress Notes (Signed)
Patients daughter called requesting something to help patient sleep.  Paged Md.

## 2012-10-01 DIAGNOSIS — R5381 Other malaise: Secondary | ICD-10-CM

## 2012-10-01 DIAGNOSIS — E782 Mixed hyperlipidemia: Secondary | ICD-10-CM

## 2012-10-01 DIAGNOSIS — R5383 Other fatigue: Secondary | ICD-10-CM

## 2012-10-01 LAB — BASIC METABOLIC PANEL WITH GFR
BUN: 17 mg/dL (ref 6–23)
CO2: 24 meq/L (ref 19–32)
Calcium: 8.7 mg/dL (ref 8.4–10.5)
Chloride: 98 meq/L (ref 96–112)
Creatinine, Ser: 0.9 mg/dL (ref 0.50–1.35)
GFR calc Af Amer: 88 mL/min — ABNORMAL LOW
GFR calc non Af Amer: 76 mL/min — ABNORMAL LOW
Glucose, Bld: 86 mg/dL (ref 70–99)
Potassium: 4.3 meq/L (ref 3.5–5.1)
Sodium: 130 meq/L — ABNORMAL LOW (ref 135–145)

## 2012-10-01 LAB — CORTISOL: Cortisol, Plasma: 9 ug/dL

## 2012-10-01 MED ORDER — SODIUM CHLORIDE 0.9 % IV SOLN
INTRAVENOUS | Status: DC
  Administered 2012-10-01 (×2): via INTRAVENOUS

## 2012-10-01 NOTE — Progress Notes (Signed)
Patient ID: Victor Waters  male  WUJ:[id number]    DOB: 1926/05/27    DOA: 09/29/2012  PCP: Marga Melnick, MD  Subjective: No specific complaints today but BP and pulse still positive for orthostasis.   Objective: Weight change:   Intake/Output Summary (Last 24 hours) at 10/01/12 1418 Last data filed at 10/01/12 1300  Gross per 24 hour  Intake   1460 ml  Output   1725 ml  Net   -265 ml   Blood pressure 131/70, pulse 73, temperature 98.1 F (36.7 C), temperature source Oral, resp. rate 18, height 5\' 4"  (1.626 m), weight 65.59 kg (144 lb 9.6 oz), SpO2 97.00%.  Physical Exam: General: Alert and awake, oriented x3, not in any acute distress. HEENT: anicteric sclera, pupils reactive to light and accommodation, EOMI CVS: S1-S2 clear, no murmur rubs or gallops Chest: clear to auscultation bilaterally, no wheezing, rales or rhonchi Abdomen: soft nontender, nondistended, normal bowel sounds, no organomegaly Extremities: no cyanosis, clubbing or edema noted bilaterally Neuro: Cranial nerves II-XII intact, no focal neurological deficits  Lab Results: Basic Metabolic Panel:  Lab 10/01/12 9562 09/30/12 0500  NA 130* 128*  K 4.3 3.9  CL 98 92*  CO2 24 27  GLUCOSE 86 98  BUN 17 16  CREATININE 0.90 0.82  CALCIUM 8.7 9.0  MG -- --  PHOS -- --   Liver Function Tests:  Lab 09/29/12 2015  AST 20  ALT 17  ALKPHOS 60  BILITOT 0.9  PROT 6.6  ALBUMIN 3.8   CBC:  Lab 09/30/12 0500 09/29/12 2015  WBC 4.7 8.1  NEUTROABS -- 6.3  HGB 14.0 14.1  HCT 39.8 40.1  MCV 86.3 86.1  PLT 139* 145*   Cardiac Enzymes:  Lab 09/30/12 1325 09/30/12 0341 09/29/12 2030  CKTOTAL -- -- --  CKMB -- -- --  CKMBINDEX -- -- --  TROPONINI <0.30 <0.30 <0.30   BNP: No components found with this basename: POCBNP:2 CBG:  Lab 09/29/12 1953  GLUCAP 112*     Micro Results: No results found for this or any previous visit (from the past 240 hour(s)).  Studies/Results: Dg Chest 2  View  09/29/2012  *RADIOLOGY REPORT*  Clinical Data: Dizziness, cough  CHEST - 2 VIEW  Comparison: 07/06/2010  Findings: Lungs are essentially clear.  No pleural effusion or pneumothorax.  Cardiomediastinal silhouette is within normal limits. Postsurgical changes related to prior CABG.  Mild degenerative changes of the visualized thoracolumbar spine.  IMPRESSION: No evidence of acute cardiopulmonary disease.   Original Report Authenticated By: Charline Bills, M.D.    Ct Head Wo Contrast  09/29/2012  *RADIOLOGY REPORT*  Clinical Data: Dizziness  CT HEAD WITHOUT CONTRAST  Technique:  Contiguous axial images were obtained from the base of the skull through the vertex without contrast.  Comparison: 01/19/2008  Findings: No evidence of parenchymal hemorrhage or extra-axial fluid collection. No mass lesion, mass effect, or midline shift.  No CT evidence of acute infarction.  Mild subcortical white matter and periventricular small vessel ischemic changes. Old left basal ganglia lacunar infarct.  The visualized paranasal sinuses are essentially clear. The mastoid air cells are unopacified.  No evidence of calvarial fracture.  IMPRESSION: No evidence of acute intracranial abnormality.  Mild small vessel ischemic changes.   Original Report Authenticated By: Charline Bills, M.D.     Medications: Scheduled Meds:    . aspirin  81 mg Oral Daily  . ezetimibe  5 mg Oral Daily  . LORazepam  0.5 mg  Oral Once  . meclizine  25 mg Oral BID  . sertraline  12.5 mg Oral Daily  . Tamsulosin HCl  0.4 mg Oral Daily  . DISCONTD: carvedilol  3.125 mg Oral BID WC  . DISCONTD: LORazepam  0.5 mg Oral Once   Continuous Infusions:    . sodium chloride 100 mL/hr at 09/30/12 0926  . sodium chloride 75 mL/hr at 10/01/12 1018     Assessment/Plan: Principal Problem:  *Hyponatremia: Na up to 130 today - continue to hold Lasix, lisinopril due to his orthostatic hypotension. Continue gentle hydration  today.  Orthostatic hypotension: BP and pulse still orthostatic today AM - stat EKG was unremarkable except for PVC's - Hold all antihypertensives, gentle hydration, ordered cortisol level  Active Problems:  TINNITUS, CHRONIC ? Vertigo - Explained the patient to follow with ENT Outpatient - cont meclizine, home health PT    HYPERTENSION, ESSENTIAL NOS: still orthostatic - Hold Coreg, Lasix and lisinopril for now   Cor Athrscl-Uns Vessel: Stable, no chest pain or shortness of breath, on ASA, statin and BB    DVT Prophylaxis: SCD  Code Status:  Disposition: Hopefully tomorrow   LOS: 2 days   Ryen Rhames M.D. [facility] 10/01/2012, 2:18 PM Pager: [phone]  If 7PM-7AM, please contact night-coverage [email or web address] Password [email or web address]

## 2012-10-02 ENCOUNTER — Observation Stay (HOSPITAL_COMMUNITY): Payer: Medicare PPO

## 2012-10-02 DIAGNOSIS — E785 Hyperlipidemia, unspecified: Secondary | ICD-10-CM

## 2012-10-02 LAB — BASIC METABOLIC PANEL
CO2: 23 mEq/L (ref 19–32)
Chloride: 97 mEq/L (ref 96–112)
GFR calc Af Amer: 89 mL/min — ABNORMAL LOW (ref >90)
Potassium: 4.3 mEq/L (ref 3.5–5.1)

## 2012-10-02 MED ORDER — MECLIZINE HCL 25 MG PO TABS: 25.0000 mg | ORAL_TABLET | Freq: Two times a day (BID) | ORAL | Status: DC

## 2012-10-02 MED ORDER — ACETAMINOPHEN 325 MG PO TABS
ORAL_TABLET | ORAL | Status: AC
  Administered 2012-10-02: 325 mg
  Filled 2012-10-02: qty 2

## 2012-10-02 MED ORDER — SENNA 8.6 MG PO TABS
2.0000 | ORAL_TABLET | Freq: Every day | ORAL | Status: DC | PRN
  Administered 2012-10-02: 17.2 mg via ORAL
  Filled 2012-10-02: qty 2

## 2012-10-02 MED ORDER — ACETAMINOPHEN 325 MG PO TABS: 650.0000 mg | ORAL_TABLET | Freq: Four times a day (QID) | ORAL | Status: DC | PRN

## 2012-10-02 NOTE — Progress Notes (Signed)
Occupational Therapy Treatment Patient Details Name: Victor Waters MRN: [id number] DOB: 01-21-1926 Today's Date: 10/02/2012 Time: 7829-5621 OT Time Calculation (min): 24 min  OT Assessment / Plan / Recommendation Comments on Treatment Session Pt with dizziness and therefore didnt practice tub transfer. Discussed many safety considerations for pt especially if he is feeling dizzy. Daughter present for session.    Follow Up Recommendations  Home health OT;Supervision/Assistance - 24 hour    Barriers to Discharge       Equipment Recommendations  None recommended by PT;None recommended by OT;Other (comment) (wife/daughter to obtain a tubseat on their own.)    Recommendations for Other Services    Frequency Min 2X/week   Plan Discharge plan remains appropriate    Precautions / Restrictions Precautions Precautions: Fall Restrictions Weight Bearing Restrictions: No        ADL  Toilet Transfer: Performed;Min guard Toilet Transfer Method: Other (comment) (with RW and min/mod verbal cues for safety) Toilet Transfer Equipment: Comfort height toilet;Grab bars Equipment Used: Rolling walker ADL Comments: Daughter present for session. Educated on tubseat for safety with pt's dizziness and options for where to obtain one. Daughter/wife plan to obtain seat. Also discussed safety to not get into tub until pt's dizziness improves. Also discussed grab bar installation for safety. Pt tends to push RW too far in front of him. Required verbal cues to stay closer to walker. Discussed other safety considerations of standing up and waiting to see if he is dizzy before starting to move further.     OT Diagnosis:    OT Problem List:   OT Treatment Interventions:     OT Goals ADL Goals ADL Goal: Toilet Transfer - Progress: Progressing toward goals  Visit Information  Last OT Received On: 10/02/12 Assistance Needed: +1    Subjective Data  Subjective: I need to try to have a bowel  movement Patient Stated Goal: wants to feel better   Prior Functioning       Cognition  Overall Cognitive Status: Appears within functional limits for tasks assessed/performed Arousal/Alertness: Awake/alert Orientation Level: Appears intact for tasks assessed Behavior During Session: [facility] for tasks performed    Mobility  Shoulder Instructions Bed Mobility Bed Mobility: Supine to Sit Supine to Sit: 6: Modified independent (Device/Increase time);HOB elevated Transfers Transfers: Sit to Stand;Stand to Sit Sit to Stand: 5: Supervision;From bed;From toilet Stand to Sit: 5: Supervision;To toilet;To chair/3-in-1 Details for Transfer Assistance: supervision for safety due to dizziness and hand placemetn on bed not RW to push up to standing.        Exercises      Balance     End of Session OT - End of Session Equipment Utilized During Treatment: Gait belt Activity Tolerance: Patient tolerated treatment well Patient left: in chair;with call bell/phone within reach;with chair alarm set;with family/visitor present  GO     Lennox Laity [phone] 10/02/2012, 9:50 AM

## 2012-10-02 NOTE — Progress Notes (Signed)
Talked to patient about HHC/ [facility] of [facility] agencies provided, choice given, patient chose [facility]; Norberta Keens RN with [facility] called for arrangements.

## 2012-10-02 NOTE — Discharge Summary (Signed)
Physician Discharge Summary  Patient ID: Victor Waters MRN: [id number] DOB/AGE: October 07, 1926 78 y.o.  Admit date: 09/29/2012 Discharge date: 10/02/2012  Primary Care Physician:  Marga Melnick, MD  Discharge Diagnoses:     .TINNITUS, CHRONIC ? vertigo .DIZZINESS, orthostatic .Hyponatremia .HYPOTENSION .Cor Athrscl-Uns Vessel  Consults: None   Discharge Medications:   Medication List     As of 10/02/2012  3:33 PM    STOP taking these medications         carvedilol 3.125 MG tablet   Commonly known as: COREG      furosemide 40 MG tablet   Commonly known as: LASIX      KLOR-CON M10 10 MEQ tablet   Generic drug: potassium chloride      lisinopril 10 MG tablet   Commonly known as: PRINIVIL,ZESTRIL      TAKE these medications         ALPRAZolam 0.5 MG tablet   Commonly known as: XANAX   Take 0.25 mg by mouth 3 times/day as needed-between meals & bedtime. For anxiety.      aspirin 81 MG tablet   Take 81 mg by mouth daily.      ezetimibe 10 MG tablet   Commonly known as: ZETIA   Take 5 mg by mouth daily.      Fish Oil 1000 MG Caps   Take 1 capsule by mouth daily.      meclizine 25 MG tablet   Commonly known as: ANTIVERT   Take 1 tablet (25 mg total) by mouth 2 (two) times daily.      Melatonin 3 MG Tbdp   Take 1 tablet by mouth at bedtime as needed. For sleep.      NITROSTAT 0.4 MG SL tablet   Generic drug: nitroGLYCERIN   DISSOLVE 1 TAB UNDER TONGUE AS NEEDED FOR CHEST PAIN, MAY REPEAT IN 5 MINUTES AS DIRECTED. ER IF NO BETTER.      rosuvastatin 40 MG tablet   Commonly known as: CRESTOR   Take 20 mg by mouth daily.      sertraline 25 MG tablet   Commonly known as: ZOLOFT   Take 12.5 mg by mouth daily.      Tamsulosin HCl 0.4 MG Caps   Commonly known as: FLOMAX   take 1 capsule by mouth once daily         Brief H and P: For complete details please refer to admission H and P, but in brief 78 yo male presented with several days of dizziness  that worsened upon standing or bending over. Denied any fevers/tinnitis/n/v/d. Stated that he eats and drinks well. No slurred speech or any other focal neuro def. Denied any visual or auditory hallucinations. He has not been able to sleep well lately due to his worries. Denies cough/edema/dysuria   Hospital Course:  Patient was admitted with several days of dizziness and orthostatic hypotension, patient was noticed to have sodium of 124 at the time of admission. Hyponatremia: Na up to 130 today at the time of discharge. He was ruled out for acute ACS. Likely hyponatremia secondary to dehydration, Lasix, lisinopril. He did have orthostatic hypotension. Lasix, lisinopril and Coreg were held, patient was gently given IV fluid hydration  Orthostatic hypotension: EKG was unremarkable except for PVC's. Of the antihypertensives were held and patient was continued on IV fluids. Random cortisol level was ordered and was within normal limits at 9.0. patient had extensive cardiac workup done in June 2013 including 2-D echocardiogram and  stress test, hence they were not repeated inpatient. Patient was counseled strongly to follow up with his PCP and cardiologist in one week. CT head was done at the time of admission and repeated as patient was unable to undergo MRI brain. Repeat CT head was also negative for any acute CVA.  TINNITUS, CHRONIC ? Vertigo:  Explained the patient to follow with ENT Outpatient. Continue meclizine, home health PT was set up.  Cor Athrscl-Uns Vessel: Stable, no chest pain or shortness of breath, on ASA, statin.      Day of Discharge BP 150/78  Pulse 75  Temp 97.7 F (36.5 C) (Oral)  Resp 18  Ht 5\' 4"  (1.626 m)  Wt 65.59 kg (144 lb 9.6 oz)  BMI 24.82 kg/m2  SpO2 98%  Physical Exam: General: Alert and awake oriented x3 not in any acute distress. HEENT: anicteric sclera, pupils reactive to light and accommodation CVS: S1-S2 clear no murmur rubs or gallops Chest: clear to  auscultation bilaterally, no wheezing rales or rhonchi Abdomen: soft nontender, nondistended, normal bowel sounds, no organomegaly Extremities: no cyanosis, clubbing or edema noted bilaterally Neuro: Cranial nerves II-XII intact, no focal neurological deficits   The results of significant diagnostics from this hospitalization (including imaging, microbiology, ancillary and laboratory) are listed below for reference.    LAB RESULTS: Basic Metabolic Panel:  Lab 10/02/12 8295 10/01/12 0442  NA 130* 130*  K 4.3 4.3  CL 97 98  CO2 23 24  GLUCOSE 93 86  BUN 17 17  CREATININE 0.87 0.90  CALCIUM 8.7 8.7  MG -- --  PHOS -- --   Liver Function Tests:  Lab 09/29/12 2015  AST 20  ALT 17  ALKPHOS 60  BILITOT 0.9  PROT 6.6  ALBUMIN 3.8   CBC:  Lab 09/30/12 0500 09/29/12 2015  WBC 4.7 8.1  NEUTROABS -- 6.3  HGB 14.0 14.1  HCT 39.8 40.1  MCV 86.3 --  PLT 139* 145*   Cardiac Enzymes:  Lab 09/30/12 1325 09/30/12 0341  CKTOTAL -- --  CKMB -- --  CKMBINDEX -- --  TROPONINI <0.30 <0.30   CBG:  Lab 09/29/12 1953  GLUCAP 112*    Significant Diagnostic Studies:  Dg Chest 2 View  09/29/2012  *RADIOLOGY REPORT*  Clinical Data: Dizziness, cough  CHEST - 2 VIEW  Comparison: 07/06/2010  Findings: Lungs are essentially clear.  No pleural effusion or pneumothorax.  Cardiomediastinal silhouette is within normal limits. Postsurgical changes related to prior CABG.  Mild degenerative changes of the visualized thoracolumbar spine.  IMPRESSION: No evidence of acute cardiopulmonary disease.   Original Report Authenticated By: Charline Bills, M.D.    Ct Head Wo Contrast  09/29/2012  *RADIOLOGY REPORT*  Clinical Data: Dizziness  CT HEAD WITHOUT CONTRAST  Technique:  Contiguous axial images were obtained from the base of the skull through the vertex without contrast.  Comparison: 01/19/2008  Findings: No evidence of parenchymal hemorrhage or extra-axial fluid collection. No mass lesion, mass  effect, or midline shift.  No CT evidence of acute infarction.  Mild subcortical white matter and periventricular small vessel ischemic changes. Old left basal ganglia lacunar infarct.  The visualized paranasal sinuses are essentially clear. The mastoid air cells are unopacified.  No evidence of calvarial fracture.  IMPRESSION: No evidence of acute intracranial abnormality.  Mild small vessel ischemic changes.   Original Report Authenticated By: Charline Bills, M.D.      Disposition and Follow-up:     Discharge Orders    Future  Orders Please Complete By Expires   Diet general      Increase activity slowly      (HEART FAILURE PATIENTS) Call MD:  Anytime you have any of the following symptoms: 1) 3 pound weight gain in 24 hours or 5 pounds in 1 week 2) shortness of breath, with or without a dry hacking cough 3) swelling in the hands, feet or stomach 4) if you have to sleep on extra pillows at night in order to breathe.      Discharge instructions      Comments:   Please do not get up from bed right away, wait for 5 mins and follow-up your doctor in 1 week.       DISPOSITION: home with home PT, RN follow-up DIET: regular ACTIVITY: as tolerated   DISCHARGE FOLLOW-UP Follow-up Information    Follow up with Marga Melnick, MD. Schedule an appointment as soon as possible for a visit in 1 week. (for hospital f/u)    Contact information:   [address] [place] [address] [phone]          Time spent on Discharge: 38 mins  Signed:   Meztli Llanas M.D. [facility] 10/02/2012, 3:33 PM Pager: [phone]

## 2012-10-02 NOTE — Progress Notes (Signed)
DME orders faxed to [facility]: patient's insurance [employer] use [facility] for all DME needs.

## 2012-10-10 ENCOUNTER — Ambulatory Visit (INDEPENDENT_AMBULATORY_CARE_PROVIDER_SITE_OTHER): Payer: Medicare PPO | Admitting: Internal Medicine

## 2012-10-10 ENCOUNTER — Encounter: Payer: Self-pay | Admitting: Internal Medicine

## 2012-10-10 VITALS — BP 126/70 | HR 77 | Temp 97.9°F | Wt 151.0 lb

## 2012-10-10 DIAGNOSIS — E871 Hypo-osmolality and hyponatremia: Secondary | ICD-10-CM

## 2012-10-10 DIAGNOSIS — I951 Orthostatic hypotension: Secondary | ICD-10-CM

## 2012-10-10 LAB — BASIC METABOLIC PANEL
CO2: 27 mEq/L (ref 19–32)
Calcium: 8.9 mg/dL (ref 8.4–10.5)
GFR: 93.68 mL/min (ref >60.00)
Potassium: 4 mEq/L (ref 3.5–5.1)
Sodium: 130 mEq/L — ABNORMAL LOW (ref 135–145)

## 2012-10-10 NOTE — Patient Instructions (Addendum)
Repeat the isometric exercises discussed 4- 5 times prior to standing if you've been seated for a period of time. Blood Pressure Goal  Ideally is an AVERAGE < 135/85. This AVERAGE should be calculated from @ least 5-7 BP readings taken @ different times of day on different days of week. You should not respond to isolated BP readings , but rather the AVERAGE for that week  

## 2012-10-10 NOTE — Progress Notes (Signed)
Subjective:    Patient ID: Victor Waters, male    DOB: 1926/12/13, 78 y.o.   MRN: [id number]  HPI Hospital records 10/12-10/14/13 were reviewed. He had significant postural hypotension and confusion in the setting of significant hyponatremia. At the time of discharge his sodium had risen to 130; he was taken off furosemide, K+, & ACE-I. Renal function was normal except for minimally reduced GFR of 89 with normals greater than 90.  CAT scan showed age appropriate atrophic and microvascular ischemic changes with no acute intracranial process.  Home health nurses are monitoring him at home; he continues to had some postural symptoms. He has been continuing meclizine twice a day  to prevent dizziness  Medication review indicates he is not been on HCTZ. He has been on Tamsulosin  from his urologist    Review of Systems Disease Monitoring  Blood pressure range: 125-138/60-62  Chest pain:no  Dyspnea: no   Claudication: no   Medication compliance:yes  Medication Side Effects    Urinary frequency: nocturia X 3 , unchanged with Tamsulosin  Edema: no   Preventitive Healthcare:  Exercise: pet care   Diet Pattern: low sodium         Objective:   Physical Exam  Gen.: Thin but healthy and well-nourished in appearance. Alert, appropriate and cooperative throughout exam.Appears younger than stated age  Head: Normocephalic without obvious abnormalities Eyes: No corneal or conjunctival inflammation noted.  Extraocular motion intact. No nystagmus. Neck: No deformities, masses, or tenderness noted. Range of motion & Thyroid normal.NVD 3 cm @ 10 degrees Lungs: Normal respiratory effort; chest expands symmetrically. Lungs are clear to auscultation without rales, wheezes, or increased work of breathing. Heart: Normal rate and rhythm. Normal S1 and S2. No gallop, click, or rub. S4 with slurring  Abdomen: Bowel sounds normal; abdomen soft and nontender. No masses, organomegaly or hernias noted.No  HJR.                                                                                  Musculoskeletal/extremities:  No clubbing, cyanosis,  or deformity noted. Tone & strength  normal.Joints normal. Nail health  Good.Trace edema  Vascular: Carotid, radial artery, dorsalis pedis and  posterior tibial pulses are full and equal. No bruits present. Neurologic: Alert and oriented x3. Deep tendon reflexes symmetrical and normal.          Skin: Intact without suspicious lesions or rashes. Lymph: No cervical, axillary lymphadenopathy present. Psych: Mood and affect are normal. Normally interactive                                                                                         Assessment & Plan:  #1 hyponatremia, possibly from furosemide  #2 postural hypotension; the urologic agent may be compounding this.  #3 hypertension, well-controlled  Plan: Electrolytes will be  rechecked., Tamsulosin will be discontinued. If a prostate agent is needed; possibly finasteride could be prescribed by his urologist. Blood pressure and symptoms will be monitored off this agent.  He's been asked to take the meclizine and alprazolam as little as possible because of its potential effect on level of alertness and balance.

## 2012-10-18 ENCOUNTER — Telehealth: Payer: Self-pay

## 2012-10-18 DIAGNOSIS — T887XXA Unspecified adverse effect of drug or medicament, initial encounter: Secondary | ICD-10-CM

## 2012-10-18 DIAGNOSIS — IMO0001 Reserved for inherently not codable concepts without codable children: Secondary | ICD-10-CM

## 2012-10-18 NOTE — Telephone Encounter (Signed)
Message copied by Maurice Small on Wed Oct 18, 2012  4:59 PM ------      Message from: Pecola Lawless      Created: Tue Oct 17, 2012  6:16 PM       I have reviewed causes of hyponatremia. Unfortunately sertraline may cause this. Please stop the sertraline and recheck electrolytes (BMET) after 2-3 weeks. Code: hyponatremia

## 2012-10-18 NOTE — Telephone Encounter (Signed)
Spoke with patient's wife, she verbalized understanding of d/c'ing medication. Dr.Hopper gave verbal 1/2 by mouth x 5 days then d/c sertraline (Zoloft). Appointment scheduled to check BMP on 11/06/2012 @ 10:30

## 2012-10-19 ENCOUNTER — Encounter: Payer: Self-pay | Admitting: Internal Medicine

## 2012-11-06 ENCOUNTER — Other Ambulatory Visit (INDEPENDENT_AMBULATORY_CARE_PROVIDER_SITE_OTHER): Payer: Medicare PPO

## 2012-11-06 DIAGNOSIS — T887XXA Unspecified adverse effect of drug or medicament, initial encounter: Secondary | ICD-10-CM

## 2012-11-06 DIAGNOSIS — IMO0001 Reserved for inherently not codable concepts without codable children: Secondary | ICD-10-CM

## 2012-11-06 DIAGNOSIS — Z789 Other specified health status: Secondary | ICD-10-CM

## 2012-11-06 LAB — BASIC METABOLIC PANEL
BUN: 18 mg/dL (ref 6–23)
CO2: 25 mEq/L (ref 19–32)
Chloride: 104 mEq/L (ref 96–112)
Potassium: 4 mEq/L (ref 3.5–5.1)

## 2012-11-13 ENCOUNTER — Other Ambulatory Visit: Payer: Self-pay | Admitting: Internal Medicine

## 2012-11-13 DIAGNOSIS — R42 Dizziness and giddiness: Secondary | ICD-10-CM

## 2012-11-13 DIAGNOSIS — M79609 Pain in unspecified limb: Secondary | ICD-10-CM

## 2012-11-13 DIAGNOSIS — I959 Hypotension, unspecified: Secondary | ICD-10-CM

## 2012-11-13 DIAGNOSIS — I2510 Atherosclerotic heart disease of native coronary artery without angina pectoris: Secondary | ICD-10-CM

## 2012-12-23 ENCOUNTER — Other Ambulatory Visit: Payer: Self-pay | Admitting: Internal Medicine

## 2012-12-25 NOTE — Telephone Encounter (Signed)
Left message on VM for patient informing him Controlled Substance Contract to be signed at office and RX to be picked up

## 2013-01-12 ENCOUNTER — Encounter: Payer: Self-pay | Admitting: Internal Medicine

## 2013-01-22 ENCOUNTER — Other Ambulatory Visit: Payer: Self-pay | Admitting: Internal Medicine

## 2013-01-22 NOTE — Telephone Encounter (Signed)
Refill X1; R x2

## 2013-01-22 NOTE — Telephone Encounter (Signed)
Hopp please advise, med not refilled in Epic system

## 2013-01-31 ENCOUNTER — Other Ambulatory Visit: Payer: Self-pay | Admitting: Internal Medicine

## 2013-02-02 ENCOUNTER — Other Ambulatory Visit: Payer: Self-pay | Admitting: Internal Medicine

## 2013-02-03 ENCOUNTER — Emergency Department (HOSPITAL_COMMUNITY)
Admission: EM | Admit: 2013-02-03 | Discharge: 2013-02-03 | Disposition: A | Discharge status: Home / Self Care | Payer: Medicare Other | Attending: Emergency Medicine | Admitting: Emergency Medicine

## 2013-02-03 ENCOUNTER — Encounter (HOSPITAL_COMMUNITY): Payer: Self-pay | Admitting: *Deleted

## 2013-02-03 ENCOUNTER — Other Ambulatory Visit: Payer: Self-pay

## 2013-02-03 DIAGNOSIS — J069 Acute upper respiratory infection, unspecified: Secondary | ICD-10-CM | POA: Insufficient documentation

## 2013-02-03 DIAGNOSIS — Z87898 Personal history of other specified conditions: Secondary | ICD-10-CM | POA: Insufficient documentation

## 2013-02-03 DIAGNOSIS — Z8659 Personal history of other mental and behavioral disorders: Secondary | ICD-10-CM | POA: Insufficient documentation

## 2013-02-03 DIAGNOSIS — I10 Essential (primary) hypertension: Secondary | ICD-10-CM | POA: Insufficient documentation

## 2013-02-03 DIAGNOSIS — Z862 Personal history of diseases of the blood and blood-forming organs and certain disorders involving the immune mechanism: Secondary | ICD-10-CM | POA: Insufficient documentation

## 2013-02-03 DIAGNOSIS — Z87448 Personal history of other diseases of urinary system: Secondary | ICD-10-CM | POA: Insufficient documentation

## 2013-02-03 DIAGNOSIS — Z8639 Personal history of other endocrine, nutritional and metabolic disease: Secondary | ICD-10-CM | POA: Insufficient documentation

## 2013-02-03 DIAGNOSIS — E785 Hyperlipidemia, unspecified: Secondary | ICD-10-CM | POA: Insufficient documentation

## 2013-02-03 DIAGNOSIS — I252 Old myocardial infarction: Secondary | ICD-10-CM | POA: Insufficient documentation

## 2013-02-03 DIAGNOSIS — Z79899 Other long term (current) drug therapy: Secondary | ICD-10-CM | POA: Insufficient documentation

## 2013-02-03 DIAGNOSIS — I2510 Atherosclerotic heart disease of native coronary artery without angina pectoris: Secondary | ICD-10-CM | POA: Insufficient documentation

## 2013-02-03 DIAGNOSIS — Z7982 Long term (current) use of aspirin: Secondary | ICD-10-CM | POA: Insufficient documentation

## 2013-02-03 DIAGNOSIS — R51 Headache: Secondary | ICD-10-CM | POA: Insufficient documentation

## 2013-02-03 HISTORY — DX: Essential (primary) hypertension: I10

## 2013-02-03 MED ORDER — IBUPROFEN 800 MG PO TABS
800.0000 mg | ORAL_TABLET | Freq: Once | ORAL | Status: DC
  Filled 2013-02-03: qty 1

## 2013-02-03 NOTE — ED Notes (Signed)
Per pt report: Pt reports BP has been going up this past week.  Pt checked BP at home and it was 179/100.  Pt's BP medications had been changed recently. Pt reports recently experiencing a chest cold and running nose for about a week.

## 2013-02-03 NOTE — ED Provider Notes (Signed)
History     CSN: [id number]  Arrival date & time 02/03/13  1859   First MD Initiated Contact with Patient 02/03/13 2006      Chief Complaint  Patient presents with  . Hypertension    (Consider location/radiation/quality/duration/timing/severity/associated sxs/prior treatment) HPI   Patient presents to the ER with complaints of hypertension. HE is on Coreg 3.125 mg medications to be used if BP is greater than 140. His medication was recently changed this past October after an episode of hyponatremia. He took 2 Coreg pills this morning, 1 Coreg pill this evening and 50 mg of Lisinopril around 6pm this evening. At home the latest BP was noted to be 179/100. The BP in the ED is 179/93.  He is complaining of a mild headache that started this evening but is not having any other symptoms of confusion, focal weakness, or difficulty ambulating. Per wife he is acting normal. They brought him in because they were concerned that he may have a stroke. nad vss  Past Medical History  Diagnosis Date  . Myocardial infarction 2003    hx of   . Hyperlipidemia   . Anxiety   . Fatigue   . Insomnia   . Coronary artery disease   . Renal artery stenosis     bilateral  . Hyponatremia 09/2012    Associated with postural hypotension and confusion  . Hypertension     Past Surgical History  Procedure Laterality Date  . Cardiac catheterization  2003    revdeling severe three-vessel disease  . Coronary artery bypass graft      LIMA to LAD, SVG to diagonal, SVG to RCA, and SVG to circumflex  . Polypectomy      colon  . Renal artery stent  01-28-10    Family History  Problem Relation Age of Onset  . Diabetes Mother   . Stroke Mother   . Heart attack Father 31    died  . Colon cancer Other     paternal grandfather    History  Substance Use Topics  . Smoking status: Never Smoker   . Smokeless tobacco: Not on file  . Alcohol Use: No      Review of Systems  Review of Systems  Gen: no  weight loss, fevers, chills, night sweats, + hypertension  Eyes: no discharge or drainage, no occular pain or visual changes  Nose: no epistaxis or rhinorrhea  Mouth: no dental pain, no sore throat  Neck: no neck pain  Lungs:No wheezing, coughing or hemoptysis CV: no chest pain, palpitations, dependent edema or orthopnea  Abd: no abdominal pain, nausea, vomiting  GU: no dysuria or gross hematuria  MSK:  No abnormalities  Neuro: + mild headache, no focal neurologic deficits  Skin: no abnormalities Psyche: negative.   Allergies  Lisinopril and Niacin  Home Medications   Current Outpatient Rx  Name  Route  Sig  Dispense  Refill  . aspirin 81 MG tablet   Oral   Take 81 mg by mouth daily.           Marland Kitchen CALCIUM-MAGNESIUM-ZINC PO   Oral   Take 1 tablet by mouth daily.         . carvedilol (COREG) 3.125 MG tablet   Oral   Take 3.125 mg by mouth. ONLY if blood pressure is above 140         . cholecalciferol (VITAMIN D) 1000 UNITS tablet   Oral   Take 2,000 Units by mouth daily.         Marland Kitchen  ezetimibe (ZETIA) 10 MG tablet   Oral   Take 5 mg by mouth daily.         . meclizine (ANTIVERT) 25 MG tablet   Oral   Take 25 mg by mouth 2 (two) times daily as needed for dizziness.         . Melatonin 3 MG TBDP   Oral   Take 1 tablet by mouth at bedtime as needed. For sleep.         Marland Kitchen NITROSTAT 0.4 MG SL tablet      DISSOLVE 1 TAB UNDER TONGUE AS NEEDED FOR CHEST PAIN, MAY REPEAT IN 5 MINUTES AS DIRECTED. ER IF NO BETTER.   30 each   2   . Omega-3 Fatty Acids (FISH OIL) 1000 MG CAPS   Oral   Take 1 capsule by mouth daily.           . rosuvastatin (CRESTOR) 40 MG tablet   Oral   Take 20 mg by mouth daily.         Marland Kitchen ALPRAZolam (XANAX) 0.5 MG tablet      take 1/2 to 1 tablet by mouth three times a day if needed for anxiety and 1 and 1/2 tablet at bedtime   30 tablet   0     BP 194/89  Pulse 72  Temp(Src) 97.4 F (36.3 C) (Oral)  Resp 20  Ht 5\' 4"   (1.626 m)  Wt 151 lb (68.493 kg)  BMI 25.91 kg/m2  SpO2 100%  Physical Exam  Nursing note and vitals reviewed. Constitutional: He appears well-developed and well-nourished. No distress.  HENT:  Head: Normocephalic and atraumatic.  Eyes: Pupils are equal, round, and reactive to light.  Neck: Normal range of motion. Neck supple.  Cardiovascular: Normal rate and regular rhythm.   Pulmonary/Chest: Effort normal.  Abdominal: Soft.  Neurological: He is alert.  Skin: Skin is warm and dry.    ED Course  Procedures (including critical care time)  Labs Reviewed - No data to display No results found.   1. Hypertension   2. URI (upper respiratory infection)       MDM  Pt appears well in the ED.  After discussing case with Dr. Juleen China, we have decided to not treat the BP since he is asymptomatic. He passed urine in the ED which appeared normal in color.   I had a long conversation with the wife and pt about not taking unprescribed medications at home. They need to  Call Dr. Caryl Never office on Monday to discuss medications. They are comfortable with this and voice their understanding.  Pt has been advised of the symptoms that warrant their return to the ED. Patient has voiced understanding and has agreed to follow-up with the PCP or specialist.        Dorthula Matas, PA 02/03/13 2105

## 2013-02-03 NOTE — ED Notes (Signed)
Pt took Lisinopril 50 mg and Carvedilol 6.25mg  today.

## 2013-02-04 NOTE — ED Provider Notes (Signed)
Medical screening examination/treatment/procedure(s) were performed by non-physician practitioner and as supervising physician I was immediately available for consultation/collaboration.  Waylyn Tenbrink, MD 02/04/13 1500 

## 2013-02-05 NOTE — Telephone Encounter (Signed)
OK X1 but he needs office visit to update medical history after OP hospital visit.

## 2013-02-05 NOTE — Telephone Encounter (Signed)
Last OV 10-10-12, last filled 01-31-13 #30

## 2013-02-06 NOTE — Telephone Encounter (Signed)
Rx called in to pharmacy. 

## 2013-02-06 NOTE — Telephone Encounter (Signed)
Pt scheduled for 02/07/13. °

## 2013-02-07 ENCOUNTER — Encounter: Payer: Self-pay | Admitting: Internal Medicine

## 2013-02-07 ENCOUNTER — Ambulatory Visit (INDEPENDENT_AMBULATORY_CARE_PROVIDER_SITE_OTHER): Payer: Medicare Other | Admitting: Internal Medicine

## 2013-02-07 VITALS — BP 140/78 | HR 77 | Temp 98.2°F | Wt 149.0 lb

## 2013-02-07 DIAGNOSIS — G479 Sleep disorder, unspecified: Secondary | ICD-10-CM

## 2013-02-07 DIAGNOSIS — I10 Essential (primary) hypertension: Secondary | ICD-10-CM

## 2013-02-07 MED ORDER — CARVEDILOL 3.125 MG PO TABS: ORAL_TABLET | ORAL | Status: DC

## 2013-02-07 NOTE — Progress Notes (Signed)
Subjective:    Patient ID: Victor Waters, male    DOB: Dec 16, 1926, 79 y.o.   MRN: [id number]  HPI 02/03/13  ER notes reviewed. No labs completed. His blood pressure had been high as 179/100 prior to ER visit. He did have a prescription for carvedilol 3.125 mg to be taken twice a day if the systolic is over 564 systolic. In the emergency room blood pressure was noted to be as high as 194 systolic.  His wife had given him for the lisinopril 40 mg pill  1/2 prior to the ER visit. He has had a cough with ACE inhibitor  Since the emergency room visit he has taken 3 carvedilol. Blood pressures range 2/18 from 128/70-146/82.  He is on low salt diet; he  exercises minimally as walking. He denies chest pain, palpitations, dyspnea, or claudication.    Review of Systems  His wife states he has been under stress related to some family health issues.  He sleeps 5 hours a night using melatonin nightly  as well as less than a half of Xanax every other night. He does take daytime naps. He denies alcohol or caffeine intake.    Objective:   Physical Exam Appears healthy and well-nourished & in no acute distress.Appears younger than stated age  No carotid bruits are present.No neck pain distention present at 10 - 15 degrees. Thyroid normal to palpation  Heart rhythm and rate are normal with no significant murmurs or gallops. S4  Chest is clear with no increased work of breathing  There is no evidence of aortic aneurysm or renal artery bruits  Abdomen soft with no organomegaly or masses. No HJR  No clubbing or cyanosis . Trace sock line edema present.  Pedal pulses are intact   No ischemic skin changes are present . Nails healthy .  Alert and oriented. Strength, tone, DTRs reflexes normal          Assessment & Plan:  #1 hypertension; goals discussed. The low dose carvedilol would be prophylactic  #2 sleep disorder; hygiene interventions discussed  Plan: See orders and recommendations

## 2013-02-07 NOTE — Patient Instructions (Addendum)
Minimal Blood Pressure Goal= AVERAGE < 140/90;  Ideal is an AVERAGE < 135/85. This AVERAGE should be calculated from @ least 5-7 BP readings taken @ different times of day on different days of week. You should not respond to isolated BP readings , but rather the AVERAGE for that week .Please bring your  blood pressure cuff to office visits to verify that it is reliable.It  can also be checked against the blood pressure device at the pharmacy. Finger or wrist cuffs are not dependable; an arm cuff is.  To prevent sleep dysfunction follow these instructions for sleep hygiene. Do not read, watch TV, or eat in bed. Do not get into bed until you are ready to turn off the light &  to go to sleep. Do not ingest stimulants ( decongestants, diet pills, nicotine, caffeine) after the evening meal.Do not take daytime naps.Cardiovascular exercise, this can be as simple a program as walking, is recommended 30-45 minutes 3-4 times per week. If you're not exercising you should take 6-8 weeks to build up to this level. 

## 2013-03-05 ENCOUNTER — Telehealth: Payer: Self-pay | Admitting: Internal Medicine

## 2013-03-05 MED ORDER — ALPRAZOLAM 0.5 MG PO TABS: ORAL_TABLET | ORAL | Status: DC

## 2013-03-05 NOTE — Telephone Encounter (Signed)
RX called in .

## 2013-03-05 NOTE — Telephone Encounter (Signed)
refill ALPRAZolam (Tab) 0.5 MG take 1/2 to 1 tablet by mouth three times a day if needed for anxiety and 1 and 1/2 tablet at bedtime --last fill 2.18.14 NOTE pt has mychart

## 2013-03-21 ENCOUNTER — Other Ambulatory Visit: Payer: Self-pay

## 2013-03-21 MED ORDER — ALPRAZOLAM 0.5 MG PO TABS: ORAL_TABLET | ORAL | Status: DC

## 2013-03-21 NOTE — Telephone Encounter (Signed)
RX left on VM at pharmacy

## 2013-04-20 ENCOUNTER — Telehealth: Payer: Self-pay | Admitting: *Deleted

## 2013-04-20 MED ORDER — ALPRAZOLAM 0.5 MG PO TABS: ORAL_TABLET | ORAL | Status: DC

## 2013-04-20 NOTE — Telephone Encounter (Signed)
OK #30 

## 2013-04-20 NOTE — Telephone Encounter (Signed)
Last OV 02-07-13, last filled 03-21-13 #30

## 2013-04-20 NOTE — Telephone Encounter (Signed)
Rx sent 

## 2013-05-31 ENCOUNTER — Other Ambulatory Visit: Payer: Self-pay | Admitting: Cardiology

## 2013-05-31 ENCOUNTER — Other Ambulatory Visit: Payer: Self-pay | Admitting: Internal Medicine

## 2013-06-29 ENCOUNTER — Other Ambulatory Visit: Payer: Self-pay | Admitting: Internal Medicine

## 2013-07-03 ENCOUNTER — Other Ambulatory Visit: Payer: Self-pay | Admitting: Internal Medicine

## 2013-07-04 NOTE — Telephone Encounter (Signed)
Spoke with patient, patient states he is having problems with his daughter (she is living in his home with 3 grand-children and that is a lot for him and his wife), patient would like for me to let Dr.Hopper know his current situation and see if he thinks it would be a good idea for him to re-start this medication, medication last filled 01/2012 #30/2 refills   Side note: Last OV 02/07/2013

## 2013-07-04 NOTE — Telephone Encounter (Signed)
#  30 ; R X 2 . Start with 1/2 qd X 10 days then 1 qd if needed

## 2013-07-25 ENCOUNTER — Other Ambulatory Visit: Payer: Self-pay

## 2013-07-25 ENCOUNTER — Other Ambulatory Visit: Payer: Self-pay | Admitting: Internal Medicine

## 2013-07-27 ENCOUNTER — Other Ambulatory Visit: Payer: Self-pay | Admitting: Internal Medicine

## 2013-07-27 NOTE — Telephone Encounter (Signed)
Lipid/Hep/CK 272.4/995.20  

## 2013-07-30 NOTE — Telephone Encounter (Signed)
Spoke with patient, patient aware he is due for a UDS, patient will stop by this week to pick up rx and leave urine specimen  Last UDS 12/26/12 (low risk)

## 2013-08-14 ENCOUNTER — Encounter: Payer: Self-pay | Admitting: Internal Medicine

## 2013-08-28 ENCOUNTER — Encounter: Payer: Self-pay | Admitting: Internal Medicine

## 2013-08-28 ENCOUNTER — Ambulatory Visit (INDEPENDENT_AMBULATORY_CARE_PROVIDER_SITE_OTHER): Payer: Medicare Other | Admitting: Internal Medicine

## 2013-08-28 VITALS — BP 160/75 | HR 71 | Temp 97.9°F | Ht 63.5 in | Wt 149.8 lb

## 2013-08-28 DIAGNOSIS — R079 Chest pain, unspecified: Secondary | ICD-10-CM

## 2013-08-28 DIAGNOSIS — M545 Low back pain, unspecified: Secondary | ICD-10-CM

## 2013-08-28 DIAGNOSIS — I10 Essential (primary) hypertension: Secondary | ICD-10-CM

## 2013-08-28 NOTE — Progress Notes (Signed)
  Subjective:    Patient ID: Victor Waters, male    DOB: [birth year], 79 y.o.   MRN: [id number]  HPI   He is concerned as he is "slouching" when he walks and is having recurrent low back discomfort particularly after lifting. He has noted this when he lists a 50 pound bag of chicken feed on the trunk of his car. He also has some discomfort at night while sleeping.  He has some associated weakness in his legs. There is no directly associated numbness, tingling, limb weakness, urinary or stool incontinence. He has no radicular pain into the legs.  He does have a history of ruptured disc in the 1970s.    Review of Systems  He denies fever, chills, or sweats  2 weeks ago he had chest pain across the anterior chest approximately 30 minutes while mowing. He described as dull; it did respond to nitroglycerin.      Objective:   Physical Exam  General appearance is one of good health and nourishment w/o distress. Appears younger than stated age  Eyes: No conjunctival inflammation or scleral icterus is present.  Oral exam: Dental hygiene is good; lips and gums are healthy appearing.There is no oropharyngeal erythema or exudate noted.   Heart:  Normal rate and regular rhythm.  S2 normal without gallop, murmur, click, rub or other extra sounds. S4 vs split S1. Repeat blood pressure 130/78 left arm and 140/80 right arm.     Lungs:Chest clear to auscultation; no wheezes, rhonchi,rales ,or rubs present.No increased work of breathing.   Abdomen: bowel sounds normal, soft and non-tender without masses, organomegaly or hernias noted.  No guarding or rebound   Skin:Warm & dry.  Intact without suspicious lesions or rashes   Lymphatic: No lymphadenopathy is noted about the head, neck, axilla  Musculoskeletal/extremities: No deformity or scoliosis noted of  the thoracic or lumbar spine. He is able to lie flat and sit up without help. Straight leg raising is normal bilaterally. No clubbing, cyanosis,  or edema noted. Flexion contracture right fifth digit ganglion versus Dupuytren's right palm. Range of motion  normal .Tone & strength  normal.Joints normal. Nail health  good.            Assessment & Plan:  #1 LBP #2 HTN , not uncontrolled #3 angina Plan : see orders

## 2013-08-28 NOTE — Patient Instructions (Signed)
Order for x-rays entered into  the computer; these will be performed at [address] across from  Hospital. No appointment is necessary. 

## 2013-08-29 ENCOUNTER — Ambulatory Visit (INDEPENDENT_AMBULATORY_CARE_PROVIDER_SITE_OTHER)
Admission: RE | Admit: 2013-08-29 | Discharge: 2013-08-29 | Disposition: A | Discharge status: Home / Self Care | Payer: Medicare Other | Source: Ambulatory Visit | Attending: Internal Medicine | Admitting: Internal Medicine

## 2013-08-29 DIAGNOSIS — M545 Low back pain: Secondary | ICD-10-CM

## 2013-08-30 ENCOUNTER — Ambulatory Visit: Payer: Medicare Other | Admitting: Cardiology

## 2013-09-11 ENCOUNTER — Other Ambulatory Visit: Payer: Self-pay | Admitting: Internal Medicine

## 2013-09-12 ENCOUNTER — Encounter: Payer: Self-pay | Admitting: Physician Assistant

## 2013-09-12 ENCOUNTER — Ambulatory Visit (INDEPENDENT_AMBULATORY_CARE_PROVIDER_SITE_OTHER): Payer: Medicare Other | Admitting: Physician Assistant

## 2013-09-12 ENCOUNTER — Other Ambulatory Visit: Payer: Self-pay | Admitting: Internal Medicine

## 2013-09-12 VITALS — BP 122/78 | HR 78 | Ht 64.0 in | Wt 149.0 lb

## 2013-09-12 DIAGNOSIS — I2510 Atherosclerotic heart disease of native coronary artery without angina pectoris: Secondary | ICD-10-CM

## 2013-09-12 DIAGNOSIS — I10 Essential (primary) hypertension: Secondary | ICD-10-CM

## 2013-09-12 DIAGNOSIS — R079 Chest pain, unspecified: Secondary | ICD-10-CM

## 2013-09-12 DIAGNOSIS — E785 Hyperlipidemia, unspecified: Secondary | ICD-10-CM

## 2013-09-12 MED ORDER — CARVEDILOL 3.125 MG PO TABS: 3.1250 mg | ORAL_TABLET | Freq: Two times a day (BID) | ORAL | Status: DC

## 2013-09-12 NOTE — Patient Instructions (Addendum)
PLEASE FOLLOW UP WITH DR. HOCREHIN IN 6 MONTHS  PLEASE SCHEDULE TO HAVE A LEXISCAN MYOVIEW DONE DX 786.50  MAKE SURE TO START TAKING YOUR COREG 3.125 MG TWICE DAILY

## 2013-09-12 NOTE — Telephone Encounter (Signed)
Med filled.  

## 2013-09-12 NOTE — Telephone Encounter (Signed)
OK X1 

## 2013-09-12 NOTE — Progress Notes (Signed)
[address] [place], [place]  [address] Phone: [phone] Fax:  [phone]  Date:  09/12/2013   ID:  Victor Waters, DOB Apr 18, 1926, MRN [id number]  PCP:  Marga Melnick, MD  Cardiologist:  Dr. Rollene Rotunda     History of Present Illness: Victor Waters is a 79 y.o. male who returns for evaluation of CP.    He has a history of CAD, status post CABG, renal artery stenosis, status post right renal artery stent in 01/2010, HTN, HL. LHC (2/11):  LM 30-40, pLAD 95-99 then 80 and 90, mCFX 90, pRCA occluded, S-dRCA ok, S-D1 ok, S-OM patent with 50, L-LAD ok.  Med Rx recommended.  Myoview (5/13):  Low risk with small fixed inf defect c/w scar, no ischemia, EF 48%.  Echo (6/13):  EF 55-60%, inf HK, Gr 1 DD, MAC.  Last seen by Dr. Rollene Rotunda in 04/2012.  Recently seen by PCP with complaints of CP.  He actually thought he was here to get a back brace.  He shows me his order for PT.  At first he tells me that he had CP several mos ago while just sitting on the couch.  I reviewed Dr. Frederik Pear note and asked if he had CP a few weeks ago while mowing.  He indeed had CP while mowing and came inside to take NTG.  His pain resolved with one NTG.  He states it was like his prior angina.  He had not had CP similar to this in years.  This is the first time he took NTG in many years.  He has been active feeding his chickens since then without symptoms.  He denies significant DOE.  No orthopnea, PND, edema.  No syncope.    Labs (5/13):    HDL 36, LDL 76 Labs (10/13):  K 3.8, creatinine 0.89, ALT 17, Hgb 14, PLT 139K, TSH 1.458 Labs (11/13):  K 4, creatinine 0.9  Wt Readings from Last 3 Encounters:  09/12/13 149 lb (67.586 kg)  08/28/13 149 lb 12.8 oz (67.949 kg)  02/07/13 149 lb (67.586 kg)     Past Medical History  Diagnosis Date  . Myocardial infarction 2003    hx of   . Hyperlipidemia   . Anxiety   . Fatigue   . Insomnia   . Renal artery stenosis     bilateral;  s/p R RA  stent 2011  . Hyponatremia 09/2012    Associated with postural hypotension and confusion  . Hypertension   . Coronary artery disease     a. s/p CABG;  b. LHC (2/11):  LM 30-40, pLAD 95-99 then 80 and 90, mCFX 90, pRCA occluded, S-dRCA ok, S-D1 ok, S-OM patent with 50, L-LAD ok.  Med Rx recommended.  c.  Myoview (5/13):  Low risk with small fixed inf defect c/w scar, no ischemia, EF 48%.  d.  Echo (6/13):  EF 55-60%, inf HK, Gr 1 DD, MAC    Current Outpatient Prescriptions  Medication Sig Dispense Refill  . ALPRAZolam (XANAX) 0.5 MG tablet take 1/2 to 1 tablet by mouth three times a day if needed for anxiety and 1 to 1/2 tablet at bedtime  30 tablet  0  . aspirin 81 MG tablet Take 81 mg by mouth daily.        Marland Kitchen CALCIUM-MAGNESIUM-ZINC PO Take 1 tablet by mouth daily.      . carvedilol (COREG) 3.125 MG tablet 1 tab daily      .  cholecalciferol (VITAMIN D) 1000 UNITS tablet Take 2,000 Units by mouth daily.      . CRESTOR 40 MG tablet take 1/2 tablet by mouth once daily (LABS OVERDUE)  15 tablet  0  . ezetimibe (ZETIA) 10 MG tablet Take 5 mg by mouth daily.      Marland Kitchen lisinopril (PRINIVIL,ZESTRIL) 10 MG tablet take 1 tablet by mouth once daily  30 tablet  4  . meclizine (ANTIVERT) 25 MG tablet Take 25 mg by mouth 2 (two) times daily as needed for dizziness.      . Melatonin 3 MG TBDP Take 1 tablet by mouth at bedtime as needed. For sleep.      Marland Kitchen NITROSTAT 0.4 MG SL tablet DISSOLVE 1 TAB UNDER TONGUE AS NEEDED FOR CHEST PAIN, MAY REPEAT IN 5 MINUTES AS DIRECTED. ER IF NO BETTER.  30 each  2  . Omega-3 Fatty Acids (FISH OIL) 1000 MG CAPS Take 1 capsule by mouth daily.        . rosuvastatin (CRESTOR) 40 MG tablet Take 20 mg by mouth daily.       No current facility-administered medications for this visit.    Allergies:    Allergies  Allergen Reactions  . Lisinopril Cough    Renal artery stenosis by history; ACE inhibitor  would be relatively contraindicated  . Niacin Other (See Comments)     Burning sensations in head    Social History:  The patient  reports that he has never smoked. He does not have any smokeless tobacco history on file. He reports that he does not drink alcohol or use illicit drugs.   ROS:  Please see the history of present illness.   He has low back pain.  He has had URI symptoms recently and non-productive cough.   All other systems reviewed and negative.   PHYSICAL EXAM: VS:  BP 122/78  Pulse 78  Ht 5\' 4"  (1.626 m)  Wt 149 lb (67.586 kg)  BMI 25.56 kg/m2 Well nourished, well developed, in no acute distress HEENT: normal Neck: no JVD Vascular:  No carotid bruits. Cardiac:  normal S1, S2; RRR; no murmur Lungs:  clear to auscultation bilaterally, no wheezing, rhonchi or rales Abd: soft, nontender, no hepatomegaly Ext: no edema Skin: warm and dry Neuro:  CNs 2-12 intact, no focal abnormalities noted  EKG:  From Dr. Frederik Pear office 08/28/13:  NSR, HR 71, no acute changes     ASSESSMENT AND PLAN:  1. Chest Pain:  He had an episode of CP c/w angina a few weeks ago that was NTG responsive.  He has not had any symptoms since.  His ECG is unchanged.  It has been > 1 year since his last assessment for ischemia.  I will arrange a Lexiscan Myoview.  If this is low risk, continue current Rx.  2. CAD:  Arrange myoview as noted.  Continue ASA, statin, beta blocker.   3. Hypertension:  Controlled.  I asked him to take his Coreg bid instead of QD. 4. Hyperlipidemia:  Continue statin.  5. Disposition:  F/u with Dr. Rollene Rotunda in 6 mos or sooner if myoview is abnormal.    Signed, Tereso Newcomer, PA-C  09/12/2013 2:56 PM

## 2013-09-12 NOTE — Telephone Encounter (Signed)
Last OV 08-28-13  Med filled 07-27-13  Contract signed, UDS due 07/2013

## 2013-09-12 NOTE — Telephone Encounter (Signed)
Left message for patient to inform him that his prescription for Klonopin is ready for pick up at our office and that a UDS is required. -

## 2013-09-13 NOTE — Telephone Encounter (Signed)
Last OV 08-28-13 Med filled 07/27/13 #30 with 0. Chart shows med was filled and discontinued for being a duplicate on 9/23.   Low Risk due for a screen

## 2013-09-13 NOTE — Telephone Encounter (Signed)
This medication cannot be taken with clonazepam which was recently filled. The combination would increase risk for altered mental status and increased risk of falls with fracture or other bodily injury. This has been documented in multiple studies by Dr. Haig Prophet'  Geront[facility] group.  I do not want to put your health @ risk; if you have concerns about these recommendations; please make a followup appointment.

## 2013-09-14 NOTE — Telephone Encounter (Signed)
Med denied per provider will contact pt to notify.

## 2013-09-20 ENCOUNTER — Ambulatory Visit (INDEPENDENT_AMBULATORY_CARE_PROVIDER_SITE_OTHER): Payer: Medicare Other | Admitting: Internal Medicine

## 2013-09-20 ENCOUNTER — Encounter: Payer: Self-pay | Admitting: Internal Medicine

## 2013-09-20 VITALS — BP 149/77 | HR 74 | Temp 98.9°F | Wt 149.0 lb

## 2013-09-20 DIAGNOSIS — R350 Frequency of micturition: Secondary | ICD-10-CM

## 2013-09-20 DIAGNOSIS — G479 Sleep disorder, unspecified: Secondary | ICD-10-CM

## 2013-09-20 DIAGNOSIS — R351 Nocturia: Secondary | ICD-10-CM

## 2013-09-20 DIAGNOSIS — E782 Mixed hyperlipidemia: Secondary | ICD-10-CM

## 2013-09-20 DIAGNOSIS — N259 Disorder resulting from impaired renal tubular function, unspecified: Secondary | ICD-10-CM

## 2013-09-20 MED ORDER — TAMSULOSIN HCL 0.4 MG PO CAPS: 0.4000 mg | ORAL_CAPSULE | Freq: Every day | ORAL | Status: DC

## 2013-09-20 NOTE — Progress Notes (Signed)
  Subjective:    Patient ID: Victor Waters, male    DOB: 1926-10-05, 79 y.o.   MRN: [id number]  HPI   He is awakening approximately 12 , 2 & 4 AM to go the bathroom. He is having difficulty going to sleep and has been taking alprazolam 0.5 one 4-one half to go back to sleep on average 1-2 times per week  He has worked on sleep hygiene avoiding such maneuvers as reading in bed watching TV in bed.  He has been taking melatonin approximately 30 minutes before his 8:30 pm bedtime with a full glass of water with good benefit. He takes daytime naps.  He is able to deal with urinary frequency during the day as he is able to use the bathroom anytime.    Review of Systems  He denies anxiety, depression, or panic attacks.  He denies dysuria, pyuria, or hematuria     Objective:   Physical Exam General appearance is one of good health and nourishment w/o distress. Appears younger than stated age  Eyes: No conjunctival inflammation or scleral icterus is present.    Heart:  Normal rate and regular rhythm. S1 and S2 normal without gallop, murmur, click, rub or other extra sounds  .S4 with slurring at  LSB   Lungs:Chest clear to auscultation; no wheezes, rhonchi,rales ,or rubs present.No increased work of breathing.   Abdomen: bowel sounds normal, soft and non-tender without masses, organomegaly or hernias noted.  No guarding or rebound   Skin:Warm & dry.  Intact without suspicious lesions or rashes ; no jaundice   Lymphatic: No lymphadenopathy is noted about the head, neck, axilla.    Left lobe slightly larger than the right. No significant enlargement or nodularity            Assessment & Plan:  #1 sleep disruption related to nocturia. This may be exacerbated by daytime naps. Melatonin is effective to some extent.  #2 increase risk of fall and injury with Xanax taken during the early mornings  Plans see After Visit Summary

## 2013-09-20 NOTE — Patient Instructions (Addendum)
Take the melatonin 30-60 minutes prior to going to bed with as little water as possible. Restart Tamsulosin to see if it helps the nighttime urinary frequency. Try to avoid daytime naps as this will interfere with sleep. The alprazolam should not be taken because of increased risk of falls

## 2013-09-21 LAB — HEPATIC FUNCTION PANEL
ALT: 21 U/L (ref 0–53)
AST: 24 U/L (ref 0–37)
Albumin: 4.1 g/dL (ref 3.5–5.2)
Alkaline Phosphatase: 47 U/L (ref 39–117)
Total Bilirubin: 1 mg/dL (ref 0.3–1.2)

## 2013-09-21 LAB — LIPID PANEL
Cholesterol: 116 mg/dL (ref 0–200)
LDL Cholesterol: 63 mg/dL (ref 0–99)
VLDL: 13.8 mg/dL (ref 0.0–40.0)

## 2013-09-21 LAB — BASIC METABOLIC PANEL
BUN: 17 mg/dL (ref 6–23)
GFR: 74.52 mL/min (ref >60.00)
Potassium: 4.8 mEq/L (ref 3.5–5.1)
Sodium: 137 mEq/L (ref 135–145)

## 2013-09-25 ENCOUNTER — Ambulatory Visit (HOSPITAL_COMMUNITY): Payer: Medicare Other | Attending: Cardiology | Admitting: Radiology

## 2013-09-25 VITALS — BP 146/85 | HR 85 | Ht 64.0 in | Wt 146.0 lb

## 2013-09-25 DIAGNOSIS — I252 Old myocardial infarction: Secondary | ICD-10-CM | POA: Insufficient documentation

## 2013-09-25 DIAGNOSIS — I739 Peripheral vascular disease, unspecified: Secondary | ICD-10-CM | POA: Insufficient documentation

## 2013-09-25 DIAGNOSIS — I10 Essential (primary) hypertension: Secondary | ICD-10-CM | POA: Insufficient documentation

## 2013-09-25 DIAGNOSIS — Z951 Presence of aortocoronary bypass graft: Secondary | ICD-10-CM | POA: Insufficient documentation

## 2013-09-25 DIAGNOSIS — R5381 Other malaise: Secondary | ICD-10-CM | POA: Insufficient documentation

## 2013-09-25 DIAGNOSIS — R079 Chest pain, unspecified: Secondary | ICD-10-CM

## 2013-09-25 DIAGNOSIS — R0602 Shortness of breath: Secondary | ICD-10-CM | POA: Insufficient documentation

## 2013-09-25 DIAGNOSIS — R42 Dizziness and giddiness: Secondary | ICD-10-CM | POA: Insufficient documentation

## 2013-09-25 DIAGNOSIS — E785 Hyperlipidemia, unspecified: Secondary | ICD-10-CM | POA: Insufficient documentation

## 2013-09-25 DIAGNOSIS — I2510 Atherosclerotic heart disease of native coronary artery without angina pectoris: Secondary | ICD-10-CM

## 2013-09-25 DIAGNOSIS — R0789 Other chest pain: Secondary | ICD-10-CM | POA: Insufficient documentation

## 2013-09-25 MED ORDER — TECHNETIUM TC 99M SESTAMIBI GENERIC - CARDIOLITE
11.0000 | Freq: Once | INTRAVENOUS | Status: AC | PRN
  Administered 2013-09-25: 11 via INTRAVENOUS

## 2013-09-25 MED ORDER — REGADENOSON 0.4 MG/5ML IV SOLN
0.4000 mg | Freq: Once | INTRAVENOUS | Status: AC
  Administered 2013-09-25: 0.4 mg via INTRAVENOUS

## 2013-09-25 MED ORDER — TECHNETIUM TC 99M SESTAMIBI GENERIC - CARDIOLITE
33.0000 | Freq: Once | INTRAVENOUS | Status: AC | PRN
  Administered 2013-09-25: 33 via INTRAVENOUS

## 2013-09-25 NOTE — Progress Notes (Signed)
[facility] SITE 3 NUCLEAR MED [address] [place], [place] [address] [phone]    Cardiology Nuclear Med Study  Victor Waters is a 79 y.o. male     MRN : [id number]     DOB: [birth year]  Procedure Date: 09/25/2013  Nuclear Med Background Indication for Stress Test:  Evaluation for Ischemia and Graft Patency History:  '03 MI>CABG;'11 Cath:patent grafts,N/O CAD;'13Echo:EF=55-60%,inferior HK:MPS: small fixed inferior defect with scar,no ischemia,EF=48 Cardiac Risk Factors: Hypertension, Lipids and PVD Symptoms:  Chest Pain with and without Exertion (last date of chest discomfort 1 and 1/2 months ago), Dizziness, Fatigue with Exertion, Light-Headedness and SOB   Nuclear Pre-Procedure Caffeine/Decaff Intake:  None NPO After: 7:00pm    O2 Sat: 96% on room air. IV 0.9% NS with Angio Cath:  22g  IV Site: R Antecubital  IV Started by:  Rickard Patience  Chest Size (in):  38 Cup Size: n/a  Height: 5\' 4"  (1.626 m)  Weight:  146 lb (66.225 kg)  BMI:  Body mass index is 25.05 kg/(m^2). Tech Comments:  Toprol taken at 0630    Nuclear Med Study 1 or 2 day study: 1 day  Stress Test Type:  Treadmill/Lexiscan  Reading MD: Cassell Clement, MD  Order Authorizing Provider:  Casimiro Needle Copper,MD  Resting Radionuclide: Technetium 54m Sestamibi  Resting Radionuclide Dose: 11.0 mCi   Stress Radionuclide:  Technetium 45m Sestamibi  Stress Radionuclide Dose: 33.0 mCi           Stress Protocol Rest HR: 68 Stress HR: 103  Rest BP: 146/85 Stress BP: 125/68  Exercise Time (min): 2.00 METS: n/a   Predicted Max HR: 135 bpm % Max HR: 78.52 bpm Rate Pressure Product: 21308   Dose of Adenosine (mg):  n/a Dose of Lexiscan: 0.4 mg  Dose of Atropine (mg): n/a Dose of Dobutamine: n/a mcg/kg/min (at max HR)  Stress Test Technologist: Nelson Chimes, BS-ES  Nuclear Technologist:  Domenic Polite, CNMT     Rest Procedure:  Myocardial perfusion imaging was performed at rest 45 minutes  following the intravenous administration of Technetium 53m Sestamibi.  Rest ECG: NSR with non-specific ST-T wave changes  Stress Procedure:  The patient received IV Lexiscan 0.4 mg over 15-seconds with concurrent low level exercise and then Technetium 3m Sestamibi was injected at 30-seconds while the patient continued walking one more minute.  Patient complained of head and leg pressure with lexiscan. Quantitative spect images were obtained after a 45-minute delay.  Stress ECG: No significant ST segment change suggestive of ischemia.  QPS Raw Data Images:  Normal; no motion artifact; normal heart/lung ratio. Stress Images:  There is decreased uptake in the inferior wall. Rest Images:  There is decreased uptake in the inferior wall. Subtraction (SDS):  There is a fixed inferior defect that is most consistent with diaphragmatic attenuation. Transient Ischemic Dilatation (Normal <1.22):  n/a Lung/Heart Ratio (Normal <0.45):  0.35  Quantitative Gated Spect Images QGS EDV:  102 ml QGS ESV:  63 ml  Impression Exercise Capacity:  Lexiscan with low level exercise. BP Response:  Normal blood pressure response. Clinical Symptoms:  No chest pain. ECG Impression:  No significant ST segment change suggestive of ischemia. Comparison with Prior Nuclear Study: No significant change from previous study  Overall Impression:  Low risk stress nuclear study. 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  No significant reversibility.  LV Ejection Fraction: 38%.  LV Wall Motion:  Mild global hypokinesis. Since last study LV EF has decreased.  Darlin Coco

## 2013-09-26 ENCOUNTER — Encounter: Payer: Self-pay | Admitting: Physician Assistant

## 2013-09-26 ENCOUNTER — Telehealth: Payer: Self-pay | Admitting: *Deleted

## 2013-09-26 DIAGNOSIS — I10 Essential (primary) hypertension: Secondary | ICD-10-CM

## 2013-09-26 DIAGNOSIS — R001 Bradycardia, unspecified: Secondary | ICD-10-CM

## 2013-09-26 NOTE — Telephone Encounter (Signed)
pt notified about myoview results and the need for Echo 10/12/13 9:30 to re-assess EF 38%. Pt verbalized understanding to Plan of Care

## 2013-09-27 ENCOUNTER — Telehealth: Payer: Self-pay | Admitting: Physician Assistant

## 2013-09-27 NOTE — Telephone Encounter (Signed)
Follow up      Pt's Wife called and would like to speak to some about her hus  Nuc.Test results.  Thank you!

## 2013-10-12 ENCOUNTER — Ambulatory Visit (HOSPITAL_COMMUNITY): Payer: Medicare Other | Attending: Cardiology | Admitting: Radiology

## 2013-10-12 ENCOUNTER — Encounter: Payer: Self-pay | Admitting: Physician Assistant

## 2013-10-12 DIAGNOSIS — R001 Bradycardia, unspecified: Secondary | ICD-10-CM

## 2013-10-12 DIAGNOSIS — I2510 Atherosclerotic heart disease of native coronary artery without angina pectoris: Secondary | ICD-10-CM | POA: Insufficient documentation

## 2013-10-12 DIAGNOSIS — I498 Other specified cardiac arrhythmias: Secondary | ICD-10-CM | POA: Insufficient documentation

## 2013-10-12 DIAGNOSIS — I10 Essential (primary) hypertension: Secondary | ICD-10-CM | POA: Insufficient documentation

## 2013-10-12 DIAGNOSIS — R9430 Abnormal result of cardiovascular function study, unspecified: Secondary | ICD-10-CM | POA: Insufficient documentation

## 2013-10-12 NOTE — Progress Notes (Signed)
Echocardiogram performed.  

## 2013-10-22 ENCOUNTER — Other Ambulatory Visit: Payer: Self-pay | Admitting: Internal Medicine

## 2013-10-22 NOTE — Telephone Encounter (Signed)
Crestor refill sent to pharmacy 

## 2013-10-23 ENCOUNTER — Ambulatory Visit (INDEPENDENT_AMBULATORY_CARE_PROVIDER_SITE_OTHER): Payer: Medicare Other | Admitting: *Deleted

## 2013-10-23 DIAGNOSIS — Z23 Encounter for immunization: Secondary | ICD-10-CM

## 2013-11-28 ENCOUNTER — Other Ambulatory Visit: Payer: Self-pay | Admitting: Internal Medicine

## 2013-11-28 NOTE — Telephone Encounter (Signed)
Zetia refilled per protocl

## 2013-12-20 DIAGNOSIS — R569 Unspecified convulsions: Secondary | ICD-10-CM

## 2013-12-20 HISTORY — DX: Unspecified convulsions: R56.9

## 2014-02-10 ENCOUNTER — Emergency Department (HOSPITAL_COMMUNITY): Payer: Medicare HMO

## 2014-02-10 ENCOUNTER — Emergency Department (HOSPITAL_COMMUNITY)
Admission: EM | Admit: 2014-02-10 | Discharge: 2014-02-10 | Disposition: A | Discharge status: Home / Self Care | Payer: Medicare HMO | Attending: Emergency Medicine | Admitting: Emergency Medicine

## 2014-02-10 ENCOUNTER — Encounter (HOSPITAL_COMMUNITY): Payer: Self-pay | Admitting: Emergency Medicine

## 2014-02-10 DIAGNOSIS — Z8659 Personal history of other mental and behavioral disorders: Secondary | ICD-10-CM | POA: Insufficient documentation

## 2014-02-10 DIAGNOSIS — Z7982 Long term (current) use of aspirin: Secondary | ICD-10-CM | POA: Insufficient documentation

## 2014-02-10 DIAGNOSIS — Z9889 Other specified postprocedural states: Secondary | ICD-10-CM | POA: Insufficient documentation

## 2014-02-10 DIAGNOSIS — I252 Old myocardial infarction: Secondary | ICD-10-CM | POA: Insufficient documentation

## 2014-02-10 DIAGNOSIS — J189 Pneumonia, unspecified organism: Secondary | ICD-10-CM | POA: Insufficient documentation

## 2014-02-10 DIAGNOSIS — Z951 Presence of aortocoronary bypass graft: Secondary | ICD-10-CM | POA: Insufficient documentation

## 2014-02-10 DIAGNOSIS — G4700 Insomnia, unspecified: Secondary | ICD-10-CM | POA: Insufficient documentation

## 2014-02-10 DIAGNOSIS — I2510 Atherosclerotic heart disease of native coronary artery without angina pectoris: Secondary | ICD-10-CM | POA: Insufficient documentation

## 2014-02-10 DIAGNOSIS — Z79899 Other long term (current) drug therapy: Secondary | ICD-10-CM | POA: Insufficient documentation

## 2014-02-10 DIAGNOSIS — I10 Essential (primary) hypertension: Secondary | ICD-10-CM | POA: Insufficient documentation

## 2014-02-10 DIAGNOSIS — E785 Hyperlipidemia, unspecified: Secondary | ICD-10-CM | POA: Insufficient documentation

## 2014-02-10 MED ORDER — AZITHROMYCIN 250 MG PO TABS: ORAL_TABLET | ORAL | Status: DC

## 2014-02-10 MED ORDER — BENZONATATE 100 MG PO CAPS: 100.0000 mg | ORAL_CAPSULE | Freq: Three times a day (TID) | ORAL | Status: DC

## 2014-02-10 NOTE — Discharge Instructions (Signed)

## 2014-02-10 NOTE — ED Provider Notes (Signed)
CSN: [id number]     Arrival date & time 02/10/14  7829 History   First MD Initiated Contact with Patient 02/10/14 [phone]     Chief Complaint  Patient presents with  . URI     (Consider location/radiation/quality/duration/timing/severity/associated sxs/prior Treatment) Patient is a 80 y.o. male presenting with URI. The history is provided by the patient and the spouse.  URI  Patient here complaining of URI symptoms x8 days. Notes cough productive of yellow sputum without associated dyspnea. No recent fever or chills. No anginal type chest pain. No lower extremity edema. No orthopnea or dyspnea on exertion. Denies any vomiting or diarrhea. Has used over-the-counter medications without resolve. He is concerned he may have pneumonia. Does note positive sick exposures Past Medical History  Diagnosis Date  . Myocardial infarction 2003    hx of   . Hyperlipidemia   . Anxiety   . Fatigue   . Insomnia   . Renal artery stenosis     bilateral;  s/p R RA stent 2011  . Hyponatremia 09/2012    Associated with postural hypotension and confusion  . Hypertension   . Coronary artery disease     a. s/p CABG;  b. LHC (2/11):  LM 30-40, pLAD 95-99 then 80 and 90, mCFX 90, pRCA occluded, S-dRCA ok, S-D1 ok, S-OM patent with 50, L-LAD ok.  Med Rx recommended.  c.  Myoview (5/13):  Low risk with small fixed inf defect c/w scar, no ischemia, EF 48%.  d.  Echo (6/13):  EF 55-60%, inf HK, Gr 1 DD, MAC;  e. Lexiscan Myoview (10/14):  Low risk, EF 38%, inferobasal infarct, no ischemia.  Marland Kitchen Hx of echocardiogram     Echo (10/14):  EF 55-60%, Gr 1DD, MAC, mild MR, mild LAE.   Past Surgical History  Procedure Laterality Date  . Cardiac catheterization  2003    revdeling severe three-vessel disease  . Coronary artery bypass graft      LIMA to LAD, SVG to diagonal, SVG to RCA, and SVG to circumflex  . Polypectomy      colon  . Renal artery stent  01-28-10   Family History  Problem Relation Age of Onset  .  Diabetes Mother   . Stroke Mother   . Heart attack Father 71    died  . Colon cancer Other     paternal grandfather   History  Substance Use Topics  . Smoking status: Never Smoker   . Smokeless tobacco: Not on file  . Alcohol Use: No    Review of Systems  All other systems reviewed and are negative.      Allergies  Lisinopril and Niacin  Home Medications   Current Outpatient Rx  Name  Route  Sig  Dispense  Refill  . aspirin 81 MG tablet   Oral   Take 81 mg by mouth daily.           Marland Kitchen CALCIUM-MAGNESIUM-ZINC PO   Oral   Take 1 tablet by mouth daily.         . carvedilol (COREG) 3.125 MG tablet   Oral   Take 1 tablet (3.125 mg total) by mouth 2 (two) times daily with a meal. 1 tab daily   60 tablet   11   . cholecalciferol (VITAMIN D) 1000 UNITS tablet   Oral   Take 2,000 Units by mouth daily.         Marland Kitchen ezetimibe (ZETIA) 10 MG tablet   Oral  Take 5 mg by mouth daily.         Marland Kitchen lisinopril (PRINIVIL,ZESTRIL) 10 MG tablet   Oral   Take 10 mg by mouth daily.         . meclizine (ANTIVERT) 25 MG tablet   Oral   Take 25 mg by mouth 2 (two) times daily as needed for dizziness.         . Melatonin 3 MG TBDP   Oral   Take 1 tablet by mouth at bedtime as needed. For sleep.         . Omega-3 Fatty Acids (FISH OIL) 1000 MG CAPS   Oral   Take 1 capsule by mouth daily.           . rosuvastatin (CRESTOR) 40 MG tablet   Oral   Take 20 mg by mouth daily.         . tamsulosin (FLOMAX) 0.4 MG CAPS capsule   Oral   Take 1 capsule (0.4 mg total) by mouth daily after supper.   30 capsule   2   . vitamin B-12 (CYANOCOBALAMIN) 100 MCG tablet   Oral   Take 100 mcg by mouth daily.         . vitamin C (ASCORBIC ACID) 500 MG tablet   Oral   Take 500 mg by mouth daily.         Marland Kitchen NITROSTAT 0.4 MG SL tablet      DISSOLVE 1 TAB UNDER TONGUE AS NEEDED FOR CHEST PAIN, MAY REPEAT IN 5 MINUTES AS DIRECTED. ER IF NO BETTER.   30 each   2    BP  124/65  Pulse 87  Temp(Src) 97.4 F (36.3 C) (Oral)  Resp 20  SpO2 97% Physical Exam  Nursing note and vitals reviewed. Constitutional: He is oriented to person, place, and time. He appears well-developed and well-nourished.  Non-toxic appearance. No distress.  HENT:  Head: Normocephalic and atraumatic.  Eyes: Conjunctivae, EOM and lids are normal. Pupils are equal, round, and reactive to light.  Neck: Normal range of motion. Neck supple. No tracheal deviation present. No mass present.  Cardiovascular: Normal rate, regular rhythm and normal heart sounds.  Exam reveals no gallop.   No murmur heard. Pulmonary/Chest: Effort normal. No stridor. No respiratory distress. He has decreased breath sounds. He has rhonchi. He has no rales.  Abdominal: Soft. Normal appearance and bowel sounds are normal. He exhibits no distension. There is no tenderness. There is no rebound and no CVA tenderness.  Musculoskeletal: Normal range of motion. He exhibits no edema and no tenderness.  Neurological: He is alert and oriented to person, place, and time. He has normal strength. No cranial nerve deficit or sensory deficit. GCS eye subscore is 4. GCS verbal subscore is 5. GCS motor subscore is 6.  Skin: Skin is warm and dry. No abrasion and no rash noted.  Psychiatric: He has a normal mood and affect. His speech is normal and behavior is normal.    ED Course  Procedures (including critical care time) Labs Review Labs Reviewed - No data to display Imaging Review Dg Chest 2 View  02/10/2014   CLINICAL DATA:  Chest pain  EXAM: CHEST  2 VIEW  COMPARISON:  09/29/2012  FINDINGS: Normal heart size and vascularity. Prior coronary bypass changes noted. Increased streaky left lower lobe retrocardiac opacity noted, suspicious for early bronchopneumonia. Right lung clear. No effusion or pneumothorax. Negative for edema. Atherosclerosis of the aorta.  IMPRESSION: Left lower  lobe retrocardiac bronchovascular opacity  concerning for early pneumonia   Electronically Signed   By: Daryll Brod M.D.   On: 02/10/2014 09:15    EKG Interpretation   None       MDM   Final diagnoses:  None    Patient's chest x-ray results reviewed. Will treat patient for pneumonia. He has no signs of hypoxemia. He is not vomiting. Do not think that he needs to be admitted to the hospital at this time. Strict return precautions to be given.    Leota Jacobsen, MD 02/10/14 1002

## 2014-02-10 NOTE — ED Notes (Signed)
Per pt, cold like symptoms since Monday.  Cough, congestion, unknown for fever.  General body aches.  Sputum is clear.

## 2014-02-15 ENCOUNTER — Ambulatory Visit (INDEPENDENT_AMBULATORY_CARE_PROVIDER_SITE_OTHER): Payer: Medicare HMO | Admitting: Internal Medicine

## 2014-02-15 ENCOUNTER — Telehealth: Payer: Self-pay | Admitting: *Deleted

## 2014-02-15 ENCOUNTER — Ambulatory Visit: Payer: Medicare Other | Admitting: Internal Medicine

## 2014-02-15 ENCOUNTER — Encounter: Payer: Self-pay | Admitting: Internal Medicine

## 2014-02-15 VITALS — BP 140/80 | HR 98 | Temp 98.0°F | Resp 15 | Wt 150.0 lb

## 2014-02-15 DIAGNOSIS — Z0289 Encounter for other administrative examinations: Secondary | ICD-10-CM

## 2014-02-15 DIAGNOSIS — J189 Pneumonia, unspecified organism: Secondary | ICD-10-CM

## 2014-02-15 MED ORDER — DOXYCYCLINE HYCLATE 100 MG PO TABS: 100.0000 mg | ORAL_TABLET | Freq: Two times a day (BID) | ORAL | Status: DC

## 2014-02-15 NOTE — Patient Instructions (Signed)
Please  blowup at least 10  balloons a day to enhance inflation of the lungs and prevent atelectasis as we discussed.Order for x-rays entered into  the computer; these will be performed here at [place].Monday.No appointment is necessary.

## 2014-02-15 NOTE — Telephone Encounter (Signed)
Phoned to schedule a hospital f/u visit with PCP; transferred to scheduling

## 2014-02-15 NOTE — Progress Notes (Signed)
   Subjective:    Patient ID: Victor Waters, male    DOB: 03/29/1926, 80 y.o.   MRN: [id number]  HPI  S/P [facility] ED on Sunday 2/22 ;he was treated for possible pneumonia of LLL. Yesterday he finished Z-pack. He had nausea & vomiting after each dose. Continues to have productive cough with green/gray sputum. Denies nasal secretions.   Review of Systems Denies frontal HA,facial pain, nasal purulence, fever, chills, n/v, otalgia, otorrhea.     Objective:   Physical Exam  General appearance:good health ;well nourished; no acute distress or increased work of breathing is present.  No  lymphadenopathy about the head, neck, or axilla noted. Appears weak  Eyes: No conjunctival inflammation or lid edema is present.  Ears:  External ear exam shows no significant lesions or deformities.  Otoscopic examination reveals clear canals, tympanic membranes are intact bilaterally without bulging, retraction, inflammation or discharge.  Nose:  External nasal examination shows no deformity or inflammation. Nasal mucosa are pink and moist without lesions or exudates. No septal dislocation or deviation.No obstruction to airflow.   Oral exam: Dental hygiene is good; lips and gums are healthy appearing.There is no oropharyngeal erythema or exudate noted.   Neck:  No deformities,  masses, or tenderness noted.   Supple with full range of motion without pain.   Heart:  Normal rate and regular rhythm. Occasional premature.S1 and S2 normal without gallop, murmur, click, rub or other extra sounds.   Lungs:Chest clear to auscultation; no wheezes, rhonchi,or rubs present. Minor rales LLL.No increased work of breathing.    Extremities:  No cyanosis, edema, or clubbing  noted    Skin: Warm & dry w/o jaundice or tenting.       Assessment & Plan:  #1 LLL PNA  See orders

## 2014-02-15 NOTE — Progress Notes (Signed)
Pre visit review using our clinic review tool, if applicable. No additional management support is needed unless otherwise documented below in the visit note. 

## 2014-02-18 ENCOUNTER — Other Ambulatory Visit: Payer: Self-pay | Admitting: *Deleted

## 2014-02-18 ENCOUNTER — Ambulatory Visit (INDEPENDENT_AMBULATORY_CARE_PROVIDER_SITE_OTHER)
Admission: RE | Admit: 2014-02-18 | Discharge: 2014-02-18 | Disposition: A | Discharge status: Home / Self Care | Payer: Medicare HMO | Source: Ambulatory Visit | Attending: Internal Medicine | Admitting: Internal Medicine

## 2014-02-18 DIAGNOSIS — J189 Pneumonia, unspecified organism: Secondary | ICD-10-CM

## 2014-05-23 ENCOUNTER — Other Ambulatory Visit: Payer: Self-pay | Admitting: Internal Medicine

## 2014-07-19 ENCOUNTER — Other Ambulatory Visit: Payer: Self-pay | Admitting: Internal Medicine

## 2014-09-20 ENCOUNTER — Telehealth: Payer: Self-pay | Admitting: Internal Medicine

## 2014-09-20 NOTE — Telephone Encounter (Signed)
Prevnar if not taken to date

## 2014-09-20 NOTE — Telephone Encounter (Signed)
Patient can be scheduled for a nurse visit for Prevnar vaccine.

## 2014-09-20 NOTE — Telephone Encounter (Signed)
Patient had flu shot yesterday.  He would like to know if he needs to get a pneumonia shot this year.

## 2014-09-30 ENCOUNTER — Other Ambulatory Visit: Payer: Self-pay | Admitting: Physician Assistant

## 2014-12-23 ENCOUNTER — Encounter (HOSPITAL_COMMUNITY): Payer: Self-pay | Admitting: Emergency Medicine

## 2014-12-23 ENCOUNTER — Telehealth: Payer: Self-pay | Admitting: *Deleted

## 2014-12-23 ENCOUNTER — Inpatient Hospital Stay (HOSPITAL_COMMUNITY)
Admission: EM | Admit: 2014-12-23 | Discharge: 2014-12-25 | DRG: 309 | Disposition: A | Discharge status: Home / Self Care | Payer: Medicare HMO | Attending: Internal Medicine | Admitting: Internal Medicine

## 2014-12-23 DIAGNOSIS — I493 Ventricular premature depolarization: Secondary | ICD-10-CM | POA: Diagnosis not present

## 2014-12-23 DIAGNOSIS — Z79899 Other long term (current) drug therapy: Secondary | ICD-10-CM

## 2014-12-23 DIAGNOSIS — R008 Other abnormalities of heart beat: Secondary | ICD-10-CM

## 2014-12-23 DIAGNOSIS — I429 Cardiomyopathy, unspecified: Secondary | ICD-10-CM | POA: Diagnosis present

## 2014-12-23 DIAGNOSIS — I2510 Atherosclerotic heart disease of native coronary artery without angina pectoris: Secondary | ICD-10-CM | POA: Diagnosis not present

## 2014-12-23 DIAGNOSIS — D696 Thrombocytopenia, unspecified: Secondary | ICD-10-CM | POA: Diagnosis not present

## 2014-12-23 DIAGNOSIS — R002 Palpitations: Secondary | ICD-10-CM | POA: Diagnosis present

## 2014-12-23 DIAGNOSIS — F419 Anxiety disorder, unspecified: Secondary | ICD-10-CM | POA: Diagnosis present

## 2014-12-23 DIAGNOSIS — I10 Essential (primary) hypertension: Secondary | ICD-10-CM | POA: Diagnosis present

## 2014-12-23 DIAGNOSIS — I255 Ischemic cardiomyopathy: Secondary | ICD-10-CM | POA: Diagnosis not present

## 2014-12-23 DIAGNOSIS — E785 Hyperlipidemia, unspecified: Secondary | ICD-10-CM | POA: Diagnosis not present

## 2014-12-23 DIAGNOSIS — Z951 Presence of aortocoronary bypass graft: Secondary | ICD-10-CM

## 2014-12-23 DIAGNOSIS — Z7982 Long term (current) use of aspirin: Secondary | ICD-10-CM | POA: Diagnosis not present

## 2014-12-23 DIAGNOSIS — I5032 Chronic diastolic (congestive) heart failure: Secondary | ICD-10-CM | POA: Diagnosis not present

## 2014-12-23 DIAGNOSIS — R531 Weakness: Secondary | ICD-10-CM

## 2014-12-23 LAB — CBC WITH DIFFERENTIAL/PLATELET
BASOS ABS: 0 10*3/uL (ref 0.0–0.1)
Basophils Relative: 0 % (ref 0–1)
EOS PCT: 2 % (ref 0–5)
Eosinophils Absolute: 0.1 10*3/uL (ref 0.0–0.7)
HEMATOCRIT: 43.1 % (ref 39.0–52.0)
Hemoglobin: 14.3 g/dL (ref 13.0–17.0)
LYMPHS PCT: 21 % (ref 12–46)
Lymphs Abs: 1.1 10*3/uL (ref 0.7–4.0)
MCH: 30.6 pg (ref 26.0–34.0)
MCHC: 33.2 g/dL (ref 30.0–36.0)
MCV: 92.3 fL (ref 78.0–100.0)
MONO ABS: 0.4 10*3/uL (ref 0.1–1.0)
MONOS PCT: 7 % (ref 3–12)
Neutro Abs: 3.8 10*3/uL (ref 1.7–7.7)
Neutrophils Relative %: 70 % (ref 43–77)
Platelets: 127 10*3/uL — ABNORMAL LOW (ref 150–400)
RBC: 4.67 MIL/uL (ref 4.22–5.81)
RDW: 14.1 % (ref 11.5–15.5)
WBC: 5.4 10*3/uL (ref 4.0–10.5)

## 2014-12-23 LAB — COMPREHENSIVE METABOLIC PANEL
ALK PHOS: 49 U/L (ref 39–117)
ALT: 23 U/L (ref 0–53)
AST: 22 U/L (ref 0–37)
Albumin: 4.1 g/dL (ref 3.5–5.2)
Anion gap: 4 — ABNORMAL LOW (ref 5–15)
BILIRUBIN TOTAL: 1.3 mg/dL — AB (ref 0.3–1.2)
BUN: 21 mg/dL (ref 6–23)
CHLORIDE: 106 meq/L (ref 96–112)
CO2: 26 mmol/L (ref 19–32)
Calcium: 9.3 mg/dL (ref 8.4–10.5)
Creatinine, Ser: 0.87 mg/dL (ref 0.50–1.35)
GFR, EST AFRICAN AMERICAN: 88 mL/min — AB (ref >90)
GFR, EST NON AFRICAN AMERICAN: 76 mL/min — AB (ref >90)
GLUCOSE: 92 mg/dL (ref 70–99)
Potassium: 4.6 mmol/L (ref 3.5–5.1)
SODIUM: 136 mmol/L (ref 135–145)
Total Protein: 6.6 g/dL (ref 6.0–8.3)

## 2014-12-23 LAB — PHOSPHORUS: Phosphorus: 3.3 mg/dL (ref 2.3–4.6)

## 2014-12-23 LAB — TROPONIN I: Troponin I: <0.03 ng/mL (ref <0.031)

## 2014-12-23 LAB — MAGNESIUM: Magnesium: 2.1 mg/dL (ref 1.5–2.5)

## 2014-12-23 LAB — TSH: TSH: 2.415 u[IU]/mL (ref 0.350–4.500)

## 2014-12-23 MED ORDER — NITROGLYCERIN 0.4 MG SL SUBL: 0.4000 mg | SUBLINGUAL_TABLET | SUBLINGUAL | Status: DC | PRN

## 2014-12-23 MED ORDER — ROSUVASTATIN CALCIUM 20 MG PO TABS
20.0000 mg | ORAL_TABLET | Freq: Every day | ORAL | Status: DC
  Administered 2014-12-23 – 2014-12-25 (×3): 20 mg via ORAL
  Filled 2014-12-23 (×3): qty 1

## 2014-12-23 MED ORDER — TAMSULOSIN HCL 0.4 MG PO CAPS
0.4000 mg | ORAL_CAPSULE | Freq: Every day | ORAL | Status: DC
  Administered 2014-12-23 – 2014-12-25 (×3): 0.4 mg via ORAL
  Filled 2014-12-23 (×3): qty 1

## 2014-12-23 MED ORDER — DOCUSATE SODIUM 100 MG PO CAPS
100.0000 mg | ORAL_CAPSULE | Freq: Two times a day (BID) | ORAL | Status: DC
  Administered 2014-12-23 – 2014-12-25 (×4): 100 mg via ORAL
  Filled 2014-12-23 (×5): qty 1

## 2014-12-23 MED ORDER — EZETIMIBE 10 MG PO TABS
5.0000 mg | ORAL_TABLET | Freq: Every day | ORAL | Status: DC
  Administered 2014-12-23 – 2014-12-25 (×3): 5 mg via ORAL
  Filled 2014-12-23 (×3): qty 0.5

## 2014-12-23 MED ORDER — ASPIRIN 81 MG PO TABS: 81.0000 mg | ORAL_TABLET | Freq: Every day | ORAL | Status: DC

## 2014-12-23 MED ORDER — ONDANSETRON HCL 4 MG PO TABS: 4.0000 mg | ORAL_TABLET | Freq: Four times a day (QID) | ORAL | Status: DC | PRN

## 2014-12-23 MED ORDER — ASPIRIN 81 MG PO CHEW
81.0000 mg | CHEWABLE_TABLET | Freq: Every day | ORAL | Status: DC
  Administered 2014-12-24 – 2014-12-25 (×2): 81 mg via ORAL
  Filled 2014-12-23 (×2): qty 1

## 2014-12-23 MED ORDER — CARVEDILOL 3.125 MG PO TABS
3.1250 mg | ORAL_TABLET | Freq: Two times a day (BID) | ORAL | Status: DC
  Administered 2014-12-23: 3.125 mg via ORAL
  Filled 2014-12-23: qty 1

## 2014-12-23 MED ORDER — SODIUM CHLORIDE 0.9 % IJ SOLN
3.0000 mL | Freq: Two times a day (BID) | INTRAMUSCULAR | Status: DC
  Administered 2014-12-23 – 2014-12-24 (×3): 3 mL via INTRAVENOUS

## 2014-12-23 MED ORDER — ENOXAPARIN SODIUM 40 MG/0.4ML ~~LOC~~ SOLN
40.0000 mg | SUBCUTANEOUS | Status: DC
  Administered 2014-12-23 – 2014-12-24 (×2): 40 mg via SUBCUTANEOUS
  Filled 2014-12-23 (×3): qty 0.4

## 2014-12-23 MED ORDER — SODIUM CHLORIDE 0.9 % IJ SOLN: 3.0000 mL | Freq: Two times a day (BID) | INTRAMUSCULAR | Status: DC

## 2014-12-23 MED ORDER — SODIUM CHLORIDE 0.9 % IJ SOLN: 3.0000 mL | INTRAMUSCULAR | Status: DC | PRN

## 2014-12-23 MED ORDER — SODIUM CHLORIDE 0.9 % IV SOLN: 250.0000 mL | INTRAVENOUS | Status: DC | PRN

## 2014-12-23 MED ORDER — ONDANSETRON HCL 4 MG/2ML IJ SOLN: 4.0000 mg | Freq: Four times a day (QID) | INTRAMUSCULAR | Status: DC | PRN

## 2014-12-23 MED ORDER — DIPHENHYDRAMINE HCL 25 MG PO CAPS
25.0000 mg | ORAL_CAPSULE | Freq: Once | ORAL | Status: AC
  Administered 2014-12-23: 25 mg via ORAL
  Filled 2014-12-23: qty 1

## 2014-12-23 MED ORDER — ACETAMINOPHEN 325 MG PO TABS
650.0000 mg | ORAL_TABLET | Freq: Four times a day (QID) | ORAL | Status: DC | PRN
  Administered 2014-12-23 – 2014-12-25 (×2): 650 mg via ORAL
  Filled 2014-12-23 (×2): qty 2

## 2014-12-23 NOTE — ED Provider Notes (Signed)
CSN: [id number]     Arrival date & time 12/23/14  1127 History   First MD Initiated Contact with Patient 12/23/14 1129     Chief Complaint  Patient presents with  . Irregular Heart Beat  . Weakness      The history is provided by the patient.   patient presents with lightheadedness and generalized weakness this morning.  He checked his pulse rate and found it to be in the 40s.  He called his primary care team who recommended they come to the ER for evaluation.  He states at that time he felt lightheaded and weak but did not have any syncopal episode.  Reported palpitations.  He has a history of atrial fibrillation but is now noted to in sinus rhythm.  He has a known hx of atrial fibrillation but is no longer on anticoagulation.  He has a known history of coronary artery disease.  He denies palpitations at this time.  Denies headache.  Denies lightheadedness now.  No weakness of his upper lower extremities.  He is wife are concerned about his pulse rate in the 40s while at home.  Past Medical History  Diagnosis Date  . Myocardial infarction 2003    hx of   . Hyperlipidemia   . Anxiety   . Fatigue   . Insomnia   . Renal artery stenosis     bilateral;  s/p R RA stent 2011  . Hyponatremia 09/2012    Associated with postural hypotension and confusion  . Hypertension   . Coronary artery disease     a. s/p CABG;  b. LHC (2/11):  LM 30-40, pLAD 95-99 then 80 and 90, mCFX 90, pRCA occluded, S-dRCA ok, S-D1 ok, S-OM patent with 50, L-LAD ok.  Med Rx recommended.  c.  Myoview (5/13):  Low risk with small fixed inf defect c/w scar, no ischemia, EF 48%.  d.  Echo (6/13):  EF 55-60%, inf HK, Gr 1 DD, MAC;  e. Lexiscan Myoview (10/14):  Low risk, EF 38%, inferobasal infarct, no ischemia.  Marland Kitchen Hx of echocardiogram     Echo (10/14):  EF 55-60%, Gr 1DD, MAC, mild MR, mild LAE.   Past Surgical History  Procedure Laterality Date  . Cardiac catheterization  2003    revdeling severe three-vessel disease  .  Coronary artery bypass graft      LIMA to LAD, SVG to diagonal, SVG to RCA, and SVG to circumflex  . Polypectomy      colon  . Renal artery stent  01-28-10   Family History  Problem Relation Age of Onset  . Diabetes Mother   . Stroke Mother   . Heart attack Father 48    died  . Colon cancer Other     paternal grandfather   History  Substance Use Topics  . Smoking status: Never Smoker   . Smokeless tobacco: Not on file  . Alcohol Use: No    Review of Systems  All other systems reviewed and are negative.     Allergies  Lisinopril; Zithromax; and Niacin  Home Medications   Prior to Admission medications   Medication Sig Start Date End Date Taking? Authorizing Provider  aspirin 81 MG tablet Take 81 mg by mouth daily.     Yes Historical Provider, MD  CALCIUM-MAGNESIUM-ZINC PO Take 1 tablet by mouth daily.   Yes Historical Provider, MD  carvedilol (COREG) 3.125 MG tablet take 1 tablet by mouth twice a day with meals 10/02/14  Yes  Minus Breeding, MD  cholecalciferol (VITAMIN D) 1000 UNITS tablet Take 2,000 Units by mouth daily.   Yes Historical Provider, MD  CRESTOR 40 MG tablet take 1/2 tablet by mouth once daily 07/19/14  Yes Hendricks Limes, MD  ezetimibe (ZETIA) 10 MG tablet Take 5 mg by mouth daily.   Yes Historical Provider, MD  Melatonin 3 MG TBDP Take 1 tablet by mouth at bedtime as needed. For sleep.   Yes Historical Provider, MD  NITROSTAT 0.4 MG SL tablet DISSOLVE 1 TAB UNDER TONGUE AS NEEDED FOR CHEST PAIN, MAY REPEAT IN 5 MINUTES AS DIRECTED. ER IF NO BETTER. 01/22/13  Yes Hendricks Limes, MD  Omega-3 Fatty Acids (FISH OIL) 1000 MG CAPS Take 1 capsule by mouth daily.     Yes Historical Provider, MD  tamsulosin (FLOMAX) 0.4 MG CAPS capsule take 1 capsule by mouth daily after SUPPER 05/23/14  Yes Hendricks Limes, MD  benzonatate (TESSALON) 100 MG capsule Take 1 capsule (100 mg total) by mouth every 8 (eight) hours. Patient not taking: Reported on 12/23/2014 02/10/14    Leota Jacobsen, MD  doxycycline (VIBRA-TABS) 100 MG tablet Take 1 tablet (100 mg total) by mouth 2 (two) times daily. Patient not taking: Reported on 12/23/2014 02/15/14   Hendricks Limes, MD   BP 156/72 mmHg  Pulse 86  Temp(Src) 97.7 F (36.5 C) (Oral)  Resp 18  SpO2 98% Physical Exam  Constitutional: He is oriented to person, place, and time. He appears well-developed and well-nourished.  HENT:  Head: Normocephalic and atraumatic.  Eyes: EOM are normal.  Neck: Normal range of motion.  Cardiovascular: Normal rate, regular rhythm and intact distal pulses.   Frequent ectopy  Pulmonary/Chest: Effort normal and breath sounds normal. No respiratory distress.  Abdominal: Soft. He exhibits no distension. There is no tenderness.  Musculoskeletal: Normal range of motion.  Neurological: He is alert and oriented to person, place, and time.  Skin: Skin is warm and dry.  Psychiatric: He has a normal mood and affect. Judgment normal.  Nursing note and vitals reviewed.   ED Course  Procedures (including critical care time) Labs Review Labs Reviewed  CBC WITH DIFFERENTIAL - Abnormal; Notable for the following:    Platelets 127 (*)    All other components within normal limits  COMPREHENSIVE METABOLIC PANEL - Abnormal; Notable for the following:    Total Bilirubin 1.3 (*)    GFR calc non Af Amer 76 (*)    GFR calc Af Amer 88 (*)    Anion gap 4 (*)    All other components within normal limits  TROPONIN I  MAGNESIUM  TSH  PHOSPHORUS    Imaging Review No results found.   EKG Interpretation   Date/Time:  Monday December 23 2014 11:36:28 EST Ventricular Rate:  80 PR Interval:  215 QRS Duration: 97 QT Interval:  388 QTC Calculation: 448 R Axis:   62 Text Interpretation:  Sinus rhythm Ventricular trigeminy Borderline  prolonged PR interval No significant change was found Confirmed by Derrel Moore   MD, Lennette Bihari ([id number]) on 12/23/2014 2:26:00 PM      MDM   Final diagnoses:  Weakness   Palpitations  Trigeminy    Patient appears to be in trigeminy at this time.  No signs of AV block.  Blood pressure is good.  A symptomatically now.  Unclear the etiology of his general lightheadedness and weakness.  He may have been bradycardic ordering intermittent block at home when his pulse rate  was noted to be in the 59s.  Patient will be admitted to telemetry for ongoing monitoring.  Cardiology consultation.  [facility] hospitalist admission.    Hoy Morn, MD 12/24/14 [phone]

## 2014-12-23 NOTE — ED Notes (Signed)
Pt c/o not feeling good around 1am, pt states he got real weak and took his BP that when he noticed his heart rate was 49.  Pt states that his heart will "beat, beat, stop, beat beat, stop, etc."   Pt has PMH a fib and did take warfarin but not currently taking.

## 2014-12-23 NOTE — Consult Note (Signed)
CARDIOLOGY CONSULT NOTE       Patient ID: Victor Waters MRN: [id number] DOB/AGE: 79-Sep-1927 79 y.o.  Admit date: 12/23/2014 Referring Physician:  Doyle Askew Primary Physician: Unice Cobble, MD Primary Cardiologist:  Hochrein Reason for Consultation:  Bradycardia PVC  Active Problems:   Weakness   Palpitations   HPI:   81 yo with distant history of CABG in 2003.  Last myoveu 09/26/13 low risk but EF 38% Overall Impression: Low risk stress nuclear study. 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 No significant reversibility.  LV Ejection Fraction: 38%. LV Wall Motion: Mild global hypokinesis. Since last study LV EF has decreased.   F/U echo showed EF 55-60% with no significant valve disease No angina, syncope , CHF or dyspnea  Has had a lot of "head" symptoms Ringing in ears, headache congested.  Was worried he was having stroke.  Weak without focal neuro symptoms Has BP machine and it was registering low pulse.  In ER having ventricular ectopy with pulse deficit on exam.  No history of advanced heart block  Denies fever has had dry cough.  No diarrhea or postural symptoms    ROS All other systems reviewed and negative except as noted above  Past Medical History  Diagnosis Date  . Myocardial infarction 2003    hx of   . Hyperlipidemia   . Anxiety   . Fatigue   . Insomnia   . Renal artery stenosis     bilateral;  s/p R RA stent 2011  . Hyponatremia 09/2012    Associated with postural hypotension and confusion  . Hypertension   . Coronary artery disease     a. s/p CABG;  b. LHC (2/11):  LM 30-40, pLAD 95-99 then 80 and 90, mCFX 90, pRCA occluded, S-dRCA ok, S-D1 ok, S-OM patent with 50, L-LAD ok.  Med Rx recommended.  c.  Myoview (5/13):  Low risk with small fixed inf defect c/w scar, no ischemia, EF 48%.  d.  Echo (6/13):  EF 55-60%, inf HK, Gr 1 DD, MAC;  e. Lexiscan Myoview (10/14):  Low risk, EF 38%, inferobasal infarct, no ischemia.  Marland Kitchen Hx of echocardiogram       Echo (10/14):  EF 55-60%, Gr 1DD, MAC, mild MR, mild LAE.    Family History  Problem Relation Age of Onset  . Diabetes Mother   . Stroke Mother   . Heart attack Father 44    died  . Colon cancer Other     paternal grandfather    History   Social History  . Marital Status: Married    Spouse Name: N/A    Number of Children: N/A  . Years of Education: N/A   Occupational History  . Not on file.   Social History Main Topics  . Smoking status: Never Smoker   . Smokeless tobacco: Never Used  . Alcohol Use: No  . Drug Use: No  . Sexual Activity: Not on file   Other Topics Concern  . Not on file   Social History Narrative   Married   Lives with wife and 109year old ggd    Past Surgical History  Procedure Laterality Date  . Cardiac catheterization  2003    revdeling severe three-vessel disease  . Coronary artery bypass graft      LIMA to LAD, SVG to diagonal, SVG to RCA, and SVG to circumflex  . Polypectomy      colon  . Renal artery stent  01-28-10  Physical Exam: Blood pressure 163/60, pulse 80, temperature 97.7 F (36.5 C), temperature source Oral, resp. rate 18, SpO2 98 %.    Affect appropriate Healthy:  appears stated age 81: normal Neck supple with no adenopathy JVP normal no bruits no thyromegaly Lungs clear with no wheezing and good diaphragmatic motion Heart:  S1/S2 no murmur, no rub, gallop or click previous sternotomy  PMI normal Abdomen: benighn, BS positve, no tenderness, no AAA no bruit.  No HSM or HJR Distal pulses intact with no bruits No edema Neuro non-focal Skin warm and dry No muscular weakness    Medication List    ASK your doctor about these medications        aspirin 81 MG tablet  Take 81 mg by mouth daily.     benzonatate 100 MG capsule  Commonly known as:  TESSALON  Take 1 capsule (100 mg total) by mouth every 8 (eight) hours.     CALCIUM-MAGNESIUM-ZINC PO  Take 1 tablet by mouth daily.     carvedilol 3.125  MG tablet  Commonly known as:  COREG  take 1 tablet by mouth twice a day with meals     cholecalciferol 1000 UNITS tablet  Commonly known as:  VITAMIN D  Take 2,000 Units by mouth daily.     CRESTOR 40 MG tablet  Generic drug:  rosuvastatin  take 1/2 tablet by mouth once daily     doxycycline 100 MG tablet  Commonly known as:  VIBRA-TABS  Take 1 tablet (100 mg total) by mouth 2 (two) times daily.     ezetimibe 10 MG tablet  Commonly known as:  ZETIA  Take 5 mg by mouth daily.     Fish Oil 1000 MG Caps  Take 1 capsule by mouth daily.     Melatonin 3 MG Tbdp  Take 1 tablet by mouth at bedtime as needed. For sleep.     NITROSTAT 0.4 MG SL tablet  Generic drug:  nitroGLYCERIN  DISSOLVE 1 TAB UNDER TONGUE AS NEEDED FOR CHEST PAIN, MAY REPEAT IN 5 MINUTES AS DIRECTED. ER IF NO BETTER.     tamsulosin 0.4 MG Caps capsule  Commonly known as:  FLOMAX  take 1 capsule by mouth daily after SUPPER       Labs:   Lab Results  Component Value Date   WBC 5.4 12/23/2014   HGB 14.3 12/23/2014   HCT 43.1 12/23/2014   MCV 92.3 12/23/2014   PLT 127* 12/23/2014    Recent Labs Lab 12/23/14 1251  NA 136  K 4.6  CL 106  CO2 26  BUN 21  CREATININE 0.87  CALCIUM 9.3  PROT 6.6  BILITOT 1.3*  ALKPHOS 49  ALT 23  AST 22  GLUCOSE 92   Lab Results  Component Value Date   CKTOTAL 108 09/13/2009   CKMB 4.1* 09/13/2009   TROPONINI <0.03 12/23/2014    Lab Results  Component Value Date   CHOL 116 09/20/2013   CHOL 125 05/17/2012   CHOL 156 03/09/2011   Lab Results  Component Value Date   HDL 39.00* 09/20/2013   HDL 36.20* 05/17/2012   HDL 39.90 03/09/2011   Lab Results  Component Value Date   LDLCALC 63 09/20/2013   LDLCALC 76 05/17/2012   LDLCALC 100* 03/09/2011   Lab Results  Component Value Date   TRIG 69.0 09/20/2013   TRIG 62.0 05/17/2012   TRIG 81.0 03/09/2011   Lab Results  Component Value Date   CHOLHDL 3  09/20/2013   CHOLHDL 3 05/17/2012   CHOLHDL 4  03/09/2011   No results found for: LDLDIRECT    Radiology: No results found.  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

## 2014-12-23 NOTE — Telephone Encounter (Signed)
[facility] - Client [facility] Patient Name: Victor Waters Gender: Male DOB: January 19, 1926 Age: 81 Y 9 M 18 D Return Phone Number: [phone] (Primary) Address: [address] City/State/Zip: [place] [address] Client [facility] - Client Client Site [facility] - Day Physician Chama, Anderson Type Call Call Type Triage / Clinical Caller Name Elza Sortor Relationship To Patient Spouse Return Phone Number [phone] (Primary) Chief Complaint Heart palpitations or irregular heartbeat Initial Comment Caller states her husband's Bp is up and his heart beat is 42 and has a headache. PreDisposition InappropriateToAsk Nurse Assessment Nurse: Gretta Cool, RN, Byrd Hesselbach Date/Time Eilene Ghazi Time): 12/23/2014 10:48:30 AM Confirm and document reason for call. If symptomatic, describe symptoms. ---Caller states her husband's Bp is up and his heart beat is 42 and has a headache. 165/79. Took BP this morning. Feels weak. Has the patient traveled out of the country within the last 30 days? ---Not Applicable Does the patient require triage? ---Yes Related visit to physician within the last 2 weeks? ---No Does the PT have any chronic conditions? (i.e. diabetes, asthma, etc.) ---Yes List chronic conditions. ---htn Guidelines Guideline Title Affirmed Question Affirmed Notes Nurse Date/Time (Eastern Time) High Blood Pressure [1] BP # 160 / 100 AND [2] cardiac or neurologic symptoms (e.g., chest pain, difficulty breathing, unsteady gait, blurred vision) Gretta Cool, RN, Byrd Hesselbach 12/23/2014 10:49:14 AM Disp. Time Eilene Ghazi Time) Disposition Final User 12/23/2014 10:51:50 AM Go to ED Now Yes Gretta Cool, RN, Byrd Hesselbach Caller Understands: Yes Disagree/Comply: Comply PLEASE NOTE: All timestamps contained within this report are represented as [place] Standard Time. CONFIDENTIALTY NOTICE: This fax transmission is  intended only for the addressee. It contains information that is legally privileged, confidential or otherwise protected from use or disclosure. If you are not the intended recipient, you are strictly prohibited from reviewing, disclosing, copying using or disseminating any of this information or taking any action in reliance on or regarding this information. If you have received this fax in error, please notify us immediately by telephone so that we can arrange for its return to [place]. Phone: [phone], Toll-Free: [phone], Fax: [phone] Page: 2 of 2 Call Id: [id number] Care Advice Given Per Guideline GO TO ED NOW: You need to be seen in the Emergency Department. Go to the ER at ___________ [place] now. Drive carefully. CARE ADVICE given per High Blood Pressure (Adult) guideline. After Care Instructions Given Call Event Type User Date / Time Description Referrals [facility] - ED

## 2014-12-23 NOTE — H&P (Signed)
[facility] History and Physical  Victor Waters NGE:[id number] DOB: 06/23/1926 DOA: 12/23/2014  Referring physician: ED physician PCP: Unice Cobble, MD   Chief Complaint: palpitations   HPI:  Pt is 81 yo male with HTN, HLD,CAD and s/p CABG in 2011, hx of atrial fib but not on [facility], presented to [facility] ED with main concern od fairly sudden onset of lightheadedness and generalized weakness that first occurred this AM prior to the admission associated with intermittent episodes of palpitations. He was able to check his pulse and found to be in the 40's. He called his PCP and was advised to go to the ED. Pt denies any syncopal events, no chest pain or shortness of breath, no abd or urinary concerns.   In ED, pt noted to be hemodynamically stable, HR currently in 100's, blood work unremarkable. TR[facility] asked to admit for further evaluation and ED doctor consulted cardiology for assistance.   Assessment and Plan: Active Problems: Lightheadedness and palpitations - admit to telemetry bed - would not obtain CE as pt has no chest pain - will follow upon cardio rec's - 2 D ECHO requested HTN - continue home medical regimen CAD - continue Aspirin  HLD - continue zetia and statin  DVT prophylaxis: Lovenox SQ   Radiological Exams on Admission: No results found.   Code Status: Full Family Communication: Pt at bedside Disposition Plan: Admit for further evaluation    Review of Systems:  Constitutional: Negative for fever, chills. Negative for diaphoresis.  HENT: Negative for hearing loss, ear pain, nosebleeds, congestion, sore throat, neck pain, tinnitus and ear discharge.   Eyes: Negative for blurred vision, double vision, photophobia, pain, discharge and redness.  Respiratory: Negative for cough, hemoptysis, sputum production, shortness of breath, wheezing and stridor.   Cardiovascular: Negative for chest pain, palpitations, orthopnea, claudication and leg swelling.  Gastrointestinal:  Negative for heartburn, constipation, blood in stool and melena.  Genitourinary: Negative for dysuria, urgency, frequency, hematuria and flank pain.  Musculoskeletal: Negative for myalgias, back pain, joint pain and falls.  Skin: Negative for itching and rash.  Neurological:  Negative for tingling, tremors, sensory change,  loss of consciousness and headaches.  Endo/Heme/Allergies: Negative for environmental allergies and polydipsia. Does not bruise/bleed easily.  Psychiatric/Behavioral: Negative for suicidal ideas. The patient is not nervous/anxious.      Past Medical History  Diagnosis Date  . Myocardial infarction 2003    hx of   . Hyperlipidemia   . Anxiety   . Fatigue   . Insomnia   . Renal artery stenosis     bilateral;  s/p R RA stent 2011  . Hyponatremia 09/2012    Associated with postural hypotension and confusion  . Hypertension   . Coronary artery disease     a. s/p CABG;  b. LHC (2/11):  LM 30-40, pLAD 95-99 then 80 and 90, mCFX 90, pRCA occluded, S-dRCA ok, S-D1 ok, S-OM patent with 50, L-LAD ok.  Med Rx recommended.  c.  Myoview (5/13):  Low risk with small fixed inf defect c/w scar, no ischemia, EF 48%.  d.  Echo (6/13):  EF 55-60%, inf HK, Gr 1 DD, MAC;  e. Lexiscan Myoview (10/14):  Low risk, EF 38%, inferobasal infarct, no ischemia.  Marland Kitchen Hx of echocardiogram     Echo (10/14):  EF 55-60%, Gr 1DD, MAC, mild MR, mild LAE.    Past Surgical History  Procedure Laterality Date  . Cardiac catheterization  2003    revdeling severe three-vessel disease  .  Coronary artery bypass graft      LIMA to LAD, SVG to diagonal, SVG to RCA, and SVG to circumflex  . Polypectomy      colon  . Renal artery stent  01-28-10    Social History:  reports that he has never smoked. He has never used smokeless tobacco. He reports that he does not drink alcohol or use illicit drugs.  Allergies  Allergen Reactions  . Lisinopril Cough    Renal artery stenosis by history; ACE inhibitor  would  be relatively contraindicated  . Zithromax [Azithromycin]     02/15/14 N&V  . Niacin Other (See Comments)    Burning sensations in head    Family History  Problem Relation Age of Onset  . Diabetes Mother   . Stroke Mother   . Heart attack Father 27    died  . Colon cancer Other     paternal grandfather    Prior to Admission medications   Medication Sig Start Date End Date Taking? Authorizing Provider  aspirin 81 MG tablet Take 81 mg by mouth daily.     Yes Historical Provider, MD  carvedilol (COREG) 3.125 MG tablet take 1 tablet by mouth twice a day with meals 10/02/14  Yes Minus Breeding, MD  CRESTOR 40 MG tablet take 1/2 tablet by mouth once daily 07/19/14  Yes Hendricks Limes, MD  ezetimibe (ZETIA) 10 MG tablet Take 5 mg by mouth daily.   Yes Historical Provider, MD  Melatonin 3 MG TBDP Take 1 tablet by mouth at bedtime as needed. For sleep.   Yes Historical Provider, MD  tamsulosin (FLOMAX) 0.4 MG CAPS capsule take 1 capsule by mouth daily after SUPPER 05/23/14  Yes Hendricks Limes, MD    Physical Exam: Filed Vitals:   12/23/14 1138 12/23/14 1400  BP: 156/72 163/60  Pulse: 86 80  Temp: 97.7 F (36.5 C)   TempSrc: Oral   Resp: 18 18  SpO2: 98% 98%    Physical Exam  Constitutional: Appears well-developed and well-nourished. No distress.  HENT: Normocephalic. External right and left ear normal. Oropharynx is clear and moist.  Eyes: Conjunctivae and EOM are normal. PERRLA, no scleral icterus.  Neck: Normal ROM. Neck supple. No JVD. No tracheal deviation. No thyromegaly.  CVS: RRR, S1/S2 +, no gallops, no carotid bruit.  Pulmonary: Effort and breath sounds normal, no stridor, rhonchi, wheezes, rales.  Abdominal: Soft. BS +,  no distension, tenderness, rebound or guarding.  Musculoskeletal: Normal range of motion. No edema and no tenderness.  Lymphadenopathy: No lymphadenopathy noted, cervical, inguinal. Neuro: Alert. Normal reflexes, muscle tone coordination. No cranial  nerve deficit. Skin: Skin is warm and dry. No rash noted. Not diaphoretic. No erythema. No pallor.  Psychiatric: Normal mood and affect. Behavior, judgment, thought content normal.   Labs on Admission:  Basic Metabolic Panel:  Recent Labs Lab 12/23/14 1251  NA 136  K 4.6  CL 106  CO2 26  GLUCOSE 92  BUN 21  CREATININE 0.87  CALCIUM 9.3  MG 2.1  PHOS 3.3   Liver Function Tests:  Recent Labs Lab 12/23/14 1251  AST 22  ALT 23  ALKPHOS 49  BILITOT 1.3*  PROT 6.6  ALBUMIN 4.1   No results for input(s): LIPASE, AMYLASE in the last 168 hours. No results for input(s): AMMONIA in the last 168 hours. CBC:  Recent Labs Lab 12/23/14 1251  WBC 5.4  NEUTROABS 3.8  HGB 14.3  HCT 43.1  MCV 92.3  PLT  127*   Cardiac Enzymes:  Recent Labs Lab 12/23/14 1251  TROPONINI <0.03   BNP: Invalid input(s): POCBNP CBG: No results for input(s): GLUCAP in the last 168 hours.  EKG: Normal sinus rhythm, no ST/T wave changes  Faye Ramsay, MD  [facility] Pager [phone]  If 7PM-7AM, please contact night-coverage [email or web address] Password [email or web address] 12/23/2014, 3:05 PM

## 2014-12-24 DIAGNOSIS — I493 Ventricular premature depolarization: Principal | ICD-10-CM

## 2014-12-24 DIAGNOSIS — I059 Rheumatic mitral valve disease, unspecified: Secondary | ICD-10-CM

## 2014-12-24 LAB — CBC
HEMATOCRIT: 41.1 % (ref 39.0–52.0)
HEMOGLOBIN: 13.5 g/dL (ref 13.0–17.0)
MCH: 29.9 pg (ref 26.0–34.0)
MCHC: 32.8 g/dL (ref 30.0–36.0)
MCV: 90.9 fL (ref 78.0–100.0)
Platelets: 107 10*3/uL — ABNORMAL LOW (ref 150–400)
RBC: 4.52 MIL/uL (ref 4.22–5.81)
RDW: 13.9 % (ref 11.5–15.5)
WBC: 4.4 10*3/uL (ref 4.0–10.5)

## 2014-12-24 LAB — BASIC METABOLIC PANEL
ANION GAP: 4 — AB (ref 5–15)
BUN: 21 mg/dL (ref 6–23)
CO2: 26 mmol/L (ref 19–32)
CREATININE: 0.79 mg/dL (ref 0.50–1.35)
Calcium: 9 mg/dL (ref 8.4–10.5)
Chloride: 104 mEq/L (ref 96–112)
GFR calc non Af Amer: 79 mL/min — ABNORMAL LOW (ref >90)
Glucose, Bld: 103 mg/dL — ABNORMAL HIGH (ref 70–99)
Potassium: 4 mmol/L (ref 3.5–5.1)
Sodium: 134 mmol/L — ABNORMAL LOW (ref 135–145)

## 2014-12-24 MED ORDER — DIPHENHYDRAMINE HCL 12.5 MG/5ML PO ELIX
12.5000 mg | ORAL_SOLUTION | Freq: Once | ORAL | Status: AC
  Administered 2014-12-24: 12.5 mg via ORAL
  Filled 2014-12-24: qty 5

## 2014-12-24 MED ORDER — CARVEDILOL 6.25 MG PO TABS
6.2500 mg | ORAL_TABLET | Freq: Two times a day (BID) | ORAL | Status: DC
  Administered 2014-12-24 – 2014-12-25 (×3): 6.25 mg via ORAL
  Filled 2014-12-24 (×3): qty 1

## 2014-12-24 NOTE — Progress Notes (Signed)
Subjective:  No chest pain or SOB  Objective:   Vital Signs in the last 24 hours: Temp:  [97.4 F (36.3 C)-97.8 F (36.6 C)] 97.7 F (36.5 C) (01/05 0421) Pulse Rate:  [30-102] 61 (01/05 0421) Resp:  [14-20] 18 (01/05 0421) BP: (122-163)/(52-96) 134/52 mmHg (01/05 0421) SpO2:  [98 %-99 %] 98 % (01/05 0421) Weight:  [141 lb 8.6 oz (64.2 kg)-144 lb (65.318 kg)] 141 lb 8.6 oz (64.2 kg) (01/05 0421)  Intake/Output from previous day: 01/04 0701 - 01/05 0700 In: 360 [P.O.:360] Out: 600 [Urine:600] Net: -240  I/O since admission: -240  Medications: . aspirin  81 mg Oral Daily  . carvedilol  3.125 mg Oral BID WC  . docusate sodium  100 mg Oral BID  . enoxaparin (LOVENOX) injection  40 mg Subcutaneous Q24H  . ezetimibe  5 mg Oral Daily  . rosuvastatin  20 mg Oral Daily  . sodium chloride  3 mL Intravenous Q12H  . tamsulosin  0.4 mg Oral Daily       Physical Exam:   General appearance: alert, cooperative and no distress Neck: no adenopathy, no carotid bruit, no JVD, supple, symmetrical, trachea midline and thyroid not enlarged, symmetric, no tenderness/mass/nodules Lungs: clear to auscultation bilaterally Mild kyphosis Heart: regularly irregular rhythm with PVC's 1/6 sem Abdomen: soft, non-tender; bowel sounds normal; no masses,  no organomegaly Extremities: no edema, redness or tenderness in the calves or thighs Neurologic: Grossly normal   Rate: 87  Rhythm: normal sinus rhythm and PVC  ECG (independently read by me): Sinus at 80 with PVC's and trigeminy  Lab Results:  BMP Latest Ref Rng 12/24/2014 12/23/2014 09/20/2013  Glucose 70 - 99 mg/dL 103(H) 92 100(H)  BUN 6 - 23 mg/dL 21 21 17   Creatinine 0.50 - 1.35 mg/dL 0.79 0.87 1.0  Sodium 135 - 145 mmol/L 134(L) 136 137  Potassium 3.5 - 5.1 mmol/L 4.0 4.6 4.8  Chloride 96 - 112 mEq/L 104 106 105  CO2 19 - 32 mmol/L 26 26 27   Calcium 8.4 - 10.5 mg/dL 9.0 9.3 9.6     CBC Latest Ref Rng 12/24/2014 12/23/2014 09/30/2012   WBC 4.0 - 10.5 K/uL 4.4 5.4 4.7  Hemoglobin 13.0 - 17.0 g/dL 13.5 14.3 14.0  Hematocrit 39.0 - 52.0 % 41.1 43.1 39.8  Platelets 150 - 400 K/uL 107(L) 127(L) 139(L)      Recent Labs  12/23/14 1251  TROPONINI <0.03    Hepatic Function Panel  Recent Labs  12/23/14 1251  PROT 6.6  ALBUMIN 4.1  AST 22  ALT 23  ALKPHOS 49  BILITOT 1.3*   No results for input(s): INR in the last 72 hours. BNP (last 3 results) No results for input(s): PROBNP in the last 8760 hours.  Lipid Panel     Component Value Date/Time   CHOL 116 09/20/2013 1629   TRIG 69.0 09/20/2013 1629   HDL 39.00* 09/20/2013 1629   CHOLHDL 3 09/20/2013 1629   VLDL 13.8 09/20/2013 1629   LDLCALC 63 09/20/2013 1629   TSH  2.415   Imaging:  No results found.    Assessment/Plan:   Active Problems:   Weakness   Palpitations  1. PVC's with trigeminal rhythm;  Pt on coreg at 3.125 bid, will increase today to 6.25 mg bid 2. CAD: s/p CABG; no chest pain 3. Hyperlipidemia on crestor and zetia; re check lipid panel if not done since 09/2013 4. Thrombocytopenia; platelets today 107K  Echo is pending; will review when  available  Troy Sine, MD, [facility] 12/24/2014, 7:27 AM

## 2014-12-24 NOTE — Progress Notes (Addendum)
Patient ID: Victor Waters, male   DOB: [birth year], 81 y.o.   MRN: [id number]  [facility] PROGRESS NOTE  SEBASTIANO LUECKE ZTI:[id number] DOB: 1926/09/11 DOA: 12/23/2014 PCP: Unice Cobble, MD  Brief narrative: Pt is 81 yo male with HTN, HLD,CAD and s/p CABG in 2011, hx of atrial fib but not on [facility], presented to [facility] ED with main concern od fairly sudden onset of lightheadedness and generalized weakness that first occurred this AM prior to the admission associated with intermittent episodes of palpitations. He was able to check his pulse and found to be in the 40's. He called his PCP and was advised to go to the ED. Pt denies any syncopal events, no chest pain or shortness of breath, no abd or urinary concerns.   In ED, pt noted to be hemodynamically stable, HR currently in 100's, blood work unremarkable. TRH asked to admit for further evaluation and ED doctor consulted cardiology for assistance.   Assessment and Plan: Active Problems: Lightheadedness and palpitations - in the setting of PVC's with trigeminal rhythm   - coreg increased from 3/125 mg BID to 6.25 mg PO BID per cardiology  - 2 D ECHO requested - possible d/c home once cleared by cardiology  Chronic diastolic CHF  - grade I dysfunction based on 2 D ECHO in 2014 with normal EF 55% - euvolemic on exam - 2 D ECHO pending  HTN - continue home medical regimen - reasonable inpatient control  CAD, s/p CABG - continue Aspirin  HLD - continue zetia and statin Thrombocytopenia - no signs of active bleeding - change to SCD's for DVT prophylaxis   DVT prophylaxis  Lovenox SQ initially, change to SCD 1/5   Code Status: Full Family Communication: Pt at bedside Disposition Plan: Home when cleared by cardio team    IV Access:   Peripheral IV Procedures and diagnostic studies:    2 D ECHO 1/5 -->  Medical Consultants:   Cardiology  Other Consultants:   Physical therapy  Anti-Infectives:   None   Faye Ramsay, MD  [place] Pager [phone]  If 7PM-7AM, please contact night-coverage [email or web address] Password [facility] 12/24/2014, 11:53 AM   LOS: 1 day   HPI/Subjective: No events overnight.   Objective: Filed Vitals:   12/23/14 1620 12/23/14 1621 12/23/14 2101 12/24/14 0421  BP:  148/88 122/54 134/52  Pulse:  102 59 61  Temp:  97.8 F (36.6 C) 97.4 F (36.3 C) 97.7 F (36.5 C)  TempSrc:  Oral Oral Oral  Resp:  16 20 18   Height: 5\' 4"  (1.626 m)     Weight: 65.318 kg (144 lb)   64.2 kg (141 lb 8.6 oz)  SpO2:  99% 98% 98%    Intake/Output Summary (Last 24 hours) at 12/24/14 1153 Last data filed at 12/24/14 0316  Gross per 24 hour  Intake    360 ml  Output    600 ml  Net   -240 ml    Exam:   General:  Pt is alert, follows commands appropriately, not in acute distress  Cardiovascular: Irregular rate and rhythm, no rubs, no gallops  Respiratory: Clear to auscultation bilaterally, no wheezing, no crackles, no rhonchi  Abdomen: Soft, non tender, non distended, bowel sounds present, no guarding   Data Reviewed: Basic Metabolic Panel:  Recent Labs Lab 12/23/14 1251 12/24/14 0405  NA 136 134*  K 4.6 4.0  CL 106 104  CO2 26 26  GLUCOSE 92 103*  BUN 21 21  CREATININE  0.87 0.79  CALCIUM 9.3 9.0  MG 2.1  --   PHOS 3.3  --    Liver Function Tests:  Recent Labs Lab 12/23/14 1251  AST 22  ALT 23  ALKPHOS 49  BILITOT 1.3*  PROT 6.6  ALBUMIN 4.1   CBC:  Recent Labs Lab 12/23/14 1251 12/24/14 0405  WBC 5.4 4.4  NEUTROABS 3.8  --   HGB 14.3 13.5  HCT 43.1 41.1  MCV 92.3 90.9  PLT 127* 107*   Cardiac Enzymes:  Recent Labs Lab 12/23/14 1251  TROPONINI <0.03    Scheduled Meds: . aspirin  81 mg Oral Daily  . carvedilol  6.25 mg Oral BID WC  . docusate sodium  100 mg Oral BID  . enoxaparin (LOVENOX) injection  40 mg Subcutaneous Q24H  . ezetimibe  5 mg Oral Daily  . rosuvastatin  20 mg Oral Daily  . sodium chloride  3 mL Intravenous Q12H  . tamsulosin   0.4 mg Oral Daily   Continuous Infusions:

## 2014-12-24 NOTE — Progress Notes (Signed)
  Echocardiogram 2D Echocardiogram has been performed.  Victor Waters 12/24/2014, 9:21 AM

## 2014-12-24 NOTE — Evaluation (Signed)
Physical Therapy Evaluation Patient Details Name: Victor Waters MRN: [id number] DOB: [birth year] Today's Date: 12/24/2014   History of Present Illness  Pt is 81 yo male with HTN, HLD,CAD and s/p CABG in 2011, hx of atrial fib but not on [facility], presented to [facility] ED with main concern od fairly sudden onset of lightheadedness and generalized weakness that first occurred this AM prior to the admission associated with intermittent episodes of palpitations. He was able to check his pulse and found to be in the 40's. He called his PCP and was advised to go to the ED. Pt denies any syncopal events, no chest pain or shortness of breath, no abd or urinary concerns.   Clinical Impression  Pt presents with decreased mobility and decreased balance.  Note that pt has difficulty with gait with head turns horizontally and vertically with reports of dizziness and dysequilibrium.  Pt with normal VOR horizontally and vertically but with positive head thrust.  Attempted Marye Round, however pt continued to keep eyes closed with no overt nystagmus noted, however extreme dizziness when sitting back up to EOB.  Possible BPPV vs hypofunction.  Feel that he would greatly benefit from vestibular rehab.  Pt not willing to attend OP therapy, therefore recommend HHPT for vestibular/balance issues if he will agree.  Will continue to see acutely to address deficits.     Follow Up Recommendations Home health PT (if he will agree for vestibular issues)    Equipment Recommendations  None recommended by PT    Recommendations for Other Services       Precautions / Restrictions Precautions Precautions: Fall Precaution Comments: vestibular hypofunction? Restrictions Weight Bearing Restrictions: No      Mobility  Bed Mobility Overal bed mobility: Modified Independent             General bed mobility comments: increased time  Transfers Overall transfer level: Modified independent Equipment used: None                Ambulation/Gait Ambulation/Gait assistance: Supervision Ambulation Distance (Feet): 500 Feet Assistive device: None Gait Pattern/deviations: WFL(Within Functional Limits) Gait velocity: decreased   General Gait Details: Pt able to ambulate at S level, however note decreased balance with head turns horizontally and vertically.    Stairs Stairs: Yes Stairs assistance: Supervision Stair Management: Alternating pattern;One rail Right;Forwards Number of Stairs: 10 General stair comments: Pt able to perform stairs at S level with min cues for safety.   Wheelchair Mobility    Modified Rankin (Stroke Patients Only)       Balance                                 Standardized Balance Assessment Standardized Balance Assessment : Dynamic Gait Index   Dynamic Gait Index Level Surface: Mild Impairment Change in Gait Speed: Mild Impairment Gait with Horizontal Head Turns: Moderate Impairment Gait with Vertical Head Turns: Moderate Impairment Gait and Pivot Turn: Mild Impairment Step Over Obstacle: Mild Impairment Step Around Obstacles: Mild Impairment Steps: Mild Impairment Total Score: 14       Pertinent Vitals/Pain Pain Assessment: No/denies pain    Home Living Family/patient expects to be discharged to:: Private residence Living Arrangements: Spouse/significant other;Children Available Help at Discharge: Family;Available 24 hours/day Type of Home: House Home Access: Stairs to enter Entrance Stairs-Rails: Can reach both;Right;Left Entrance Stairs-Number of Steps: 5 Home Layout: One level Home Equipment: Bedside commode;Walker - 2 wheels;Wheelchair - manual  Prior Function Level of Independence: Independent               Hand Dominance        Extremity/Trunk Assessment   Upper Extremity Assessment: Overall WFL for tasks assessed           Lower Extremity Assessment: Overall WFL for tasks assessed      Cervical / Trunk  Assessment: Normal  Communication      Cognition Arousal/Alertness: Awake/alert Behavior During Therapy: WFL for tasks assessed/performed Overall Cognitive Status: Within Functional Limits for tasks assessed                      General Comments      Exercises        Assessment/Plan    PT Assessment Patient needs continued PT services  PT Diagnosis Difficulty walking   PT Problem List Decreased activity tolerance;Decreased balance;Decreased mobility  PT Treatment Interventions Gait training;Stair training;Neuromuscular re-education;Functional mobility training;Therapeutic activities;Balance training;Patient/family education   PT Goals (Current goals can be found in the Care Plan section) Acute Rehab PT Goals Patient Stated Goal: to return home PT Goal Formulation: With patient Time For Goal Achievement: 12/31/14 Potential to Achieve Goals: Good    Frequency Min 3X/week   Barriers to discharge        Co-evaluation               End of Session   Activity Tolerance: Patient tolerated treatment well Patient left: in chair;with call bell/phone within reach Nurse Communication: Mobility status         Time: 0925-1009 PT Time Calculation (min) (ACUTE ONLY): 44 min   Charges:   PT Evaluation $Initial PT Evaluation Tier I: 1 Procedure PT Treatments $Gait Training: 8-22 mins $Self Care/Home Management: 8-22   PT G Codes:        Denice Bors 12/24/2014, 10:27 AM

## 2014-12-25 ENCOUNTER — Ambulatory Visit: Payer: Medicare HMO | Admitting: Internal Medicine

## 2014-12-25 DIAGNOSIS — I255 Ischemic cardiomyopathy: Secondary | ICD-10-CM

## 2014-12-25 MED ORDER — LOSARTAN POTASSIUM 25 MG PO TABS: 25.0000 mg | ORAL_TABLET | Freq: Every day | ORAL | Status: DC

## 2014-12-25 MED ORDER — CARVEDILOL 6.25 MG PO TABS: 6.2500 mg | ORAL_TABLET | Freq: Two times a day (BID) | ORAL | Status: DC

## 2014-12-25 MED ORDER — LOSARTAN POTASSIUM 25 MG PO TABS
25.0000 mg | ORAL_TABLET | Freq: Every day | ORAL | Status: DC
  Administered 2014-12-25: 25 mg via ORAL
  Filled 2014-12-25: qty 1

## 2014-12-25 NOTE — Progress Notes (Addendum)
Subjective:  No chest pain or SOB  Objective:   Vital Signs in the last 24 hours: Temp:  [98.2 F (36.8 C)-98.3 F (36.8 C)] 98.3 F (36.8 C) (01/06 0456) Pulse Rate:  [49-85] 62 (01/06 0456) Resp:  [18-20] 20 (01/06 0456) BP: (129-145)/(51-68) 143/68 mmHg (01/06 0456) SpO2:  [99 %-100 %] 100 % (01/06 0456) Weight:  [140 lb 14.4 oz (63.912 kg)] 140 lb 14.4 oz (63.912 kg) (01/06 0456)  Intake/Output from previous day: 01/05 0701 - 01/06 0700 In: 1050 [P.O.:1050] Out: -  Net: +1050  I/O since admission: +810  Medications: . aspirin  81 mg Oral Daily  . carvedilol  6.25 mg Oral BID WC  . docusate sodium  100 mg Oral BID  . enoxaparin (LOVENOX) injection  40 mg Subcutaneous Q24H  . ezetimibe  5 mg Oral Daily  . rosuvastatin  20 mg Oral Daily  . sodium chloride  3 mL Intravenous Q12H  . tamsulosin  0.4 mg Oral Daily       Physical Exam:   General appearance: alert, cooperative and no distress Neck: no adenopathy, no carotid bruit, no JVD, supple, symmetrical, trachea midline and thyroid not enlarged, symmetric, no tenderness/mass/nodules Lungs: clear to auscultation bilaterally Mild kyphosis Heart: regularly irregular rhythm with PVC's 1/6 sem Abdomen: soft, non-tender; bowel sounds normal; no masses,  no organomegaly Extremities: no edema, redness or tenderness in the calves or thighs Neurologic: Grossly normal   Rate: 80-90  Rhythm: normal sinus rhythm and PVC  ECG (independently read by me): Sinus at 80 with PVC's and trigeminy  Lab Results:  BMP Latest Ref Rng 12/24/2014 12/23/2014 09/20/2013  Glucose 70 - 99 mg/dL 103(H) 92 100(H)  BUN 6 - 23 mg/dL 21 21 17   Creatinine 0.50 - 1.35 mg/dL 0.79 0.87 1.0  Sodium 135 - 145 mmol/L 134(L) 136 137  Potassium 3.5 - 5.1 mmol/L 4.0 4.6 4.8  Chloride 96 - 112 mEq/L 104 106 105  CO2 19 - 32 mmol/L 26 26 27   Calcium 8.4 - 10.5 mg/dL 9.0 9.3 9.6     CBC Latest Ref Rng 12/24/2014 12/23/2014 09/30/2012  WBC 4.0 - 10.5  K/uL 4.4 5.4 4.7  Hemoglobin 13.0 - 17.0 g/dL 13.5 14.3 14.0  Hematocrit 39.0 - 52.0 % 41.1 43.1 39.8  Platelets 150 - 400 K/uL 107(L) 127(L) 139(L)      Recent Labs  12/23/14 1251  TROPONINI <0.03    Hepatic Function Panel  Recent Labs  12/23/14 1251  PROT 6.6  ALBUMIN 4.1  AST 22  ALT 23  ALKPHOS 49  BILITOT 1.3*   No results for input(s): INR in the last 72 hours. BNP (last 3 results) No results for input(s): PROBNP in the last 8760 hours.  Lipid Panel     Component Value Date/Time   CHOL 116 09/20/2013 1629   TRIG 69.0 09/20/2013 1629   HDL 39.00* 09/20/2013 1629   CHOLHDL 3 09/20/2013 1629   VLDL 13.8 09/20/2013 1629   LDLCALC 63 09/20/2013 1629   TSH  2.415   Imaging:  ECHO Study Conclusions  - Left ventricle: The cavity size was normal. Wall thickness was normal. Systolic function was moderately reduced. The estimated ejection fraction was in the range of 35% to 40%. Abnormal GLPSS at -12%, global variations. Diffuse hypokinesis. The study is not technically sufficient to allow evaluation of LV diastolic function. - Aortic valve: Trileaflet. Sclerosis without stenosis. There was trivial regurgitation. - Mitral valve: Calcified annulus. Mildly thickened leaflets .  There was mild regurgitation. - Left atrium: The atrium was mildly dilated.  Impressions:  - Compared to the prior echo in 2014, the EF is now reduced to 35-40%, frequent PVC&'s are noted, there are global longitudinal strain abnormalities. The left atrium is mildly dilated.  Assessment/Plan:   Active Problems:   Weakness   Palpitations  1. PVC's with trigeminal rhythm;  Pt on coreg at 6.25 mg bid, not yet steady state at this dose. 2. CAD: s/p CABG; no chest pain 3. Cardiomyopathy with EF reduced from 55-60% on echo (09/2013) to 35-40% now but on prior nuclear study in 2014 EF was 38% 3. Hyperlipidemia on crestor and zetia; re check lipid panel if not done  since 09/2013 4. Thrombocytopenia; platelets  107K  Had cough with lisinopril 2.5 mg, will start low dose ARB with losartan 25 mg, can titrate as outpatient. Outpatient f/u with Dr. Percival Spanish.  Troy Sine, MD, [facility] 12/25/2014, 8:07 AM

## 2014-12-25 NOTE — Discharge Instructions (Signed)

## 2014-12-25 NOTE — Discharge Summary (Signed)
Physician Discharge Summary  Victor Waters QJJ:[id number] DOB: [birth year] DOA: 12/23/2014  PCP: Unice Cobble, MD  Admit date: 12/23/2014 Discharge date: 12/25/2014  Recommendations for Outpatient Follow-up:  1. Pt will need to follow up with PCP in 2-3 weeks post discharge 2. Please obtain BMP to evaluate electrolytes and kidney function 3. Please also check CBC to evaluate Hg and Hct levels 4. Please note that pt was started on Losartan per cardiology recommendations and Coreg dose was increased   Discharge Diagnoses:  Active Problems:   Weakness   Palpitations    Discharge Condition: Stable  Diet recommendation: Heart healthy diet discussed in details    Brief narrative: Pt is 81 yo male with HTN, HLD,CAD and s/p CABG in 2011, hx of atrial fib but not on [facility], presented to [facility] ED with main concern od fairly sudden onset of lightheadedness and generalized weakness that first occurred this AM prior to the admission associated with intermittent episodes of palpitations. He was able to check his pulse and found to be in the 40's. He called his PCP and was advised to go to the ED. Pt denies any syncopal events, no chest pain or shortness of breath, no abd or urinary concerns.   In ED, pt noted to be hemodynamically stable, HR currently in 100's, blood work unremarkable. [facility] asked to admit for further evaluation and ED doctor consulted cardiology for assistance.   Assessment and Plan: Active Problems: Lightheadedness and palpitations - in the setting of PVC's with trigeminal rhythm  - coreg increased from 3/125 mg BID to 6.25 mg PO BID per cardiology  - 2 D ECHO with much lower EF compared to 2 d ECHO in 2014 but per Dr. Claiborne Billings nuclear study several months ago with EF ~ 40% so nearly similar to current 2 D ECHO - please note that Losartan was added per cardio rec's Chronic diastolic CHF  - grade I dysfunction based on 2 D ECHO in 2014 with normal EF 55% - euvolemic on exam - 2 D ECHO  with EF 35 - 40% HTN - continue home medical regimen - reasonable inpatient control  CAD, s/p CABG - continue Aspirin  HLD - continue zetia and statin Thrombocytopenia - no signs of active bleeding   Code Status: Full Family Communication: Pt at bedside Disposition Plan: Home   IV Access:    Peripheral IV Procedures and diagnostic studies:    2 D ECHO 1/5 -->  Medical Consultants:    Cardiology  Other Consultants:    Physical therapy  Anti-Infectives:    None  Discharge Exam: Filed Vitals:   12/25/14 0456  BP: 143/68  Pulse: 62  Temp: 98.3 F (36.8 C)  Resp: 20   Filed Vitals:   12/24/14 0421 12/24/14 1419 12/24/14 2209 12/25/14 0456  BP: 134/52 129/66 145/51 143/68  Pulse: 61 85 49 62  Temp: 97.7 F (36.5 C) 98.2 F (36.8 C) 98.2 F (36.8 C) 98.3 F (36.8 C)  TempSrc: Oral Oral Oral Oral  Resp: 18 18 20 20   Height:      Weight: 64.2 kg (141 lb 8.6 oz)   63.912 kg (140 lb 14.4 oz)  SpO2: 98% 100% 99% 100%    General: Pt is alert, follows commands appropriately, not in acute distress Cardiovascular: Irregular rate and rhythm, no rubs, no gallops Respiratory: Clear to auscultation bilaterally, no wheezing, no crackles, no rhonchi Abdominal: Soft, non tender, non distended, bowel sounds +, no guarding  Discharge Instructions  Discharge Instructions  Diet - low sodium heart healthy    Complete by:  As directed      Increase activity slowly    Complete by:  As directed             Medication List    STOP taking these medications        doxycycline 100 MG tablet  Commonly known as:  VIBRA-TABS      TAKE these medications        aspirin 81 MG tablet  Take 81 mg by mouth daily.     benzonatate 100 MG capsule  Commonly known as:  TESSALON  Take 1 capsule (100 mg total) by mouth every 8 (eight) hours.     CALCIUM-MAGNESIUM-ZINC PO  Take 1 tablet by mouth daily.     carvedilol 6.25 MG tablet  Commonly known as:  COREG   Take 1 tablet (6.25 mg total) by mouth 2 (two) times daily with a meal.     cholecalciferol 1000 UNITS tablet  Commonly known as:  VITAMIN D  Take 2,000 Units by mouth daily.     CRESTOR 40 MG tablet  Generic drug:  rosuvastatin  take 1/2 tablet by mouth once daily     ezetimibe 10 MG tablet  Commonly known as:  ZETIA  Take 5 mg by mouth daily.     Fish Oil 1000 MG Caps  Take 1 capsule by mouth daily.     losartan 25 MG tablet  Commonly known as:  COZAAR  Take 1 tablet (25 mg total) by mouth daily.     Melatonin 3 MG Tbdp  Take 1 tablet by mouth at bedtime as needed. For sleep.     NITROSTAT 0.4 MG SL tablet  Generic drug:  nitroGLYCERIN  DISSOLVE 1 TAB UNDER TONGUE AS NEEDED FOR CHEST PAIN, MAY REPEAT IN 5 MINUTES AS DIRECTED. ER IF NO BETTER.     tamsulosin 0.4 MG Caps capsule  Commonly known as:  FLOMAX  take 1 capsule by mouth daily after SUPPER           Follow-up Information    Follow up with Unice Cobble, MD.   Specialty:  Internal Medicine   Contact information:   [address] [address] [phone]        The results of significant diagnostics from this hospitalization (including imaging, microbiology, ancillary and laboratory) are listed below for reference.     Microbiology: No results found for this or any previous visit (from the past 240 hour(s)).   Labs: Basic Metabolic Panel:  Recent Labs Lab 12/23/14 1251 12/24/14 0405  NA 136 134*  K 4.6 4.0  CL 106 104  CO2 26 26  GLUCOSE 92 103*  BUN 21 21  CREATININE 0.87 0.79  CALCIUM 9.3 9.0  MG 2.1  --   PHOS 3.3  --    Liver Function Tests:  Recent Labs Lab 12/23/14 1251  AST 22  ALT 23  ALKPHOS 49  BILITOT 1.3*  PROT 6.6  ALBUMIN 4.1   No results for input(s): LIPASE, AMYLASE in the last 168 hours. No results for input(s): AMMONIA in the last 168 hours. CBC:  Recent Labs Lab 12/23/14 1251 12/24/14 0405  WBC 5.4 4.4  NEUTROABS 3.8  --   HGB 14.3  13.5  HCT 43.1 41.1  MCV 92.3 90.9  PLT 127* 107*   Cardiac Enzymes:  Recent Labs Lab 12/23/14 1251  TROPONINI <0.03   BNP: BNP (last 3  results) No results for input(s): PROBNP in the last 8760 hours. CBG: No results for input(s): GLUCAP in the last 168 hours.   SIGNED: Time coordinating discharge: Over 30 minutes  Faye Ramsay, MD  [facility] 12/25/2014, 10:30 AM Pager [phone]  If 7PM-7AM, please contact night-coverage [email or web address] Password [email or web address]

## 2014-12-26 ENCOUNTER — Telehealth: Payer: Self-pay | Admitting: *Deleted

## 2014-12-26 NOTE — Telephone Encounter (Signed)
Pt was d/c from hospital 12/25/14 was told to f/u with pcp 2-3 wks. Pt no longer see Dr. Linna Darner he has new appt already set to see Dr. Larose Kells @ [facility] 02/05/15...Johny Chess

## 2014-12-30 ENCOUNTER — Telehealth: Payer: Self-pay | Admitting: Internal Medicine

## 2014-12-30 ENCOUNTER — Emergency Department (HOSPITAL_COMMUNITY): Payer: Medicare HMO

## 2014-12-30 ENCOUNTER — Encounter (HOSPITAL_COMMUNITY): Payer: Self-pay

## 2014-12-30 ENCOUNTER — Observation Stay (HOSPITAL_COMMUNITY)
Admission: EM | Admit: 2014-12-30 | Discharge: 2014-12-31 | Disposition: A | Discharge status: Home / Self Care | Payer: Medicare HMO | Attending: Cardiology | Admitting: Cardiology

## 2014-12-30 DIAGNOSIS — E785 Hyperlipidemia, unspecified: Secondary | ICD-10-CM | POA: Diagnosis not present

## 2014-12-30 DIAGNOSIS — R110 Nausea: Secondary | ICD-10-CM | POA: Insufficient documentation

## 2014-12-30 DIAGNOSIS — I493 Ventricular premature depolarization: Secondary | ICD-10-CM | POA: Diagnosis present

## 2014-12-30 DIAGNOSIS — R42 Dizziness and giddiness: Secondary | ICD-10-CM

## 2014-12-30 DIAGNOSIS — F419 Anxiety disorder, unspecified: Secondary | ICD-10-CM | POA: Diagnosis not present

## 2014-12-30 DIAGNOSIS — Z79899 Other long term (current) drug therapy: Secondary | ICD-10-CM | POA: Insufficient documentation

## 2014-12-30 DIAGNOSIS — Z951 Presence of aortocoronary bypass graft: Secondary | ICD-10-CM | POA: Diagnosis not present

## 2014-12-30 DIAGNOSIS — I701 Atherosclerosis of renal artery: Secondary | ICD-10-CM | POA: Diagnosis not present

## 2014-12-30 DIAGNOSIS — F05 Delirium due to known physiological condition: Secondary | ICD-10-CM

## 2014-12-30 DIAGNOSIS — I10 Essential (primary) hypertension: Secondary | ICD-10-CM | POA: Diagnosis not present

## 2014-12-30 DIAGNOSIS — Z888 Allergy status to other drugs, medicaments and biological substances status: Secondary | ICD-10-CM | POA: Diagnosis not present

## 2014-12-30 DIAGNOSIS — G4700 Insomnia, unspecified: Secondary | ICD-10-CM | POA: Insufficient documentation

## 2014-12-30 DIAGNOSIS — I2510 Atherosclerotic heart disease of native coronary artery without angina pectoris: Secondary | ICD-10-CM | POA: Diagnosis not present

## 2014-12-30 DIAGNOSIS — I252 Old myocardial infarction: Secondary | ICD-10-CM | POA: Insufficient documentation

## 2014-12-30 DIAGNOSIS — I498 Other specified cardiac arrhythmias: Secondary | ICD-10-CM

## 2014-12-30 DIAGNOSIS — R531 Weakness: Secondary | ICD-10-CM | POA: Diagnosis not present

## 2014-12-30 DIAGNOSIS — D696 Thrombocytopenia, unspecified: Secondary | ICD-10-CM | POA: Diagnosis present

## 2014-12-30 DIAGNOSIS — I429 Cardiomyopathy, unspecified: Secondary | ICD-10-CM | POA: Insufficient documentation

## 2014-12-30 DIAGNOSIS — R001 Bradycardia, unspecified: Secondary | ICD-10-CM | POA: Diagnosis not present

## 2014-12-30 DIAGNOSIS — R008 Other abnormalities of heart beat: Secondary | ICD-10-CM | POA: Diagnosis not present

## 2014-12-30 DIAGNOSIS — E871 Hypo-osmolality and hyponatremia: Secondary | ICD-10-CM

## 2014-12-30 DIAGNOSIS — R5383 Other fatigue: Secondary | ICD-10-CM | POA: Insufficient documentation

## 2014-12-30 DIAGNOSIS — E782 Mixed hyperlipidemia: Secondary | ICD-10-CM | POA: Diagnosis present

## 2014-12-30 LAB — BASIC METABOLIC PANEL
Anion gap: 9 (ref 5–15)
BUN: 21 mg/dL (ref 6–23)
CHLORIDE: 95 meq/L — AB (ref 96–112)
CO2: 23 mmol/L (ref 19–32)
CREATININE: 0.94 mg/dL (ref 0.50–1.35)
Calcium: 8.6 mg/dL (ref 8.4–10.5)
GFR calc non Af Amer: 74 mL/min — ABNORMAL LOW (ref >90)
GFR, EST AFRICAN AMERICAN: 85 mL/min — AB (ref >90)
Glucose, Bld: 128 mg/dL — ABNORMAL HIGH (ref 70–99)
Potassium: 4.3 mmol/L (ref 3.5–5.1)
Sodium: 127 mmol/L — ABNORMAL LOW (ref 135–145)

## 2014-12-30 LAB — CBC
HEMATOCRIT: 42 % (ref 39.0–52.0)
Hemoglobin: 13.7 g/dL (ref 13.0–17.0)
MCH: 30 pg (ref 26.0–34.0)
MCHC: 32.6 g/dL (ref 30.0–36.0)
MCV: 91.9 fL (ref 78.0–100.0)
Platelets: 117 10*3/uL — ABNORMAL LOW (ref 150–400)
RBC: 4.57 MIL/uL (ref 4.22–5.81)
RDW: 14.1 % (ref 11.5–15.5)
WBC: 4.3 10*3/uL (ref 4.0–10.5)

## 2014-12-30 LAB — TROPONIN I

## 2014-12-30 LAB — CBG MONITORING, ED: Glucose-Capillary: 114 mg/dL — ABNORMAL HIGH (ref 70–99)

## 2014-12-30 MED ORDER — SODIUM CHLORIDE 0.9 % IV BOLUS (SEPSIS)
500.0000 mL | Freq: Once | INTRAVENOUS | Status: AC
  Administered 2014-12-30: 500 mL via INTRAVENOUS

## 2014-12-30 NOTE — ED Notes (Signed)
Bed: WA21 Expected date:  Expected time:  Means of arrival:  Comments: Triage 1 

## 2014-12-30 NOTE — ED Notes (Signed)
Patient brought to ED by granddaughter after patient complained of weakness, dizziness, and low heart rate (in the 40's at home).  He is a patient of [facility] and they recommended he come to the ED to be evaluated.

## 2014-12-30 NOTE — ED Provider Notes (Signed)
CSN: [id number]     Arrival date & time 12/30/14  2036 History   First MD Initiated Contact with Patient 12/30/14 2152     Chief Complaint  Patient presents with  . Weakness  . Bradycardia     (Consider location/radiation/quality/duration/timing/severity/associated sxs/prior Treatment) HPI Comments: Patient with a history of coronary artery disease status post bypass surgery,  Hyperlipidemia, hypertension, and past history of hyponatremia presents with low heart rate and weakness. He was recently admitted last week for similar symptoms of bradycardia with a heart rate in the 40s with associated generalized weakness. In the ED and during the hospital course he never had bradycardia. He was initially in trigeminy and his Coreg was increased. He was seen by cardiology and had an echocardiogram showing reduced EF to 35-40% and frequent PVCs with global longitudinal strain. He went home and was doing okay until tonight. He states that he was walking to the store and got lightheaded and generally weak. He felt a little nauseated. He denies any chest pain. He's had a little bit of exertional shortness of breath but that's been going on for a few weeks. His granddaughter says that she's noticed a little bit of confusion for the last 2 weeks. He denies any ataxia. There is no vision changes. He denies any unilateral weakness.  Patient is a 81 y.o. male presenting with weakness.  Weakness Associated symptoms include headaches and shortness of breath. Pertinent negatives include no chest pain and no abdominal pain.    Past Medical History  Diagnosis Date  . Myocardial infarction 2003    hx of   . Hyperlipidemia   . Anxiety   . Fatigue   . Insomnia   . Renal artery stenosis     bilateral;  s/p R RA stent 2011  . Hyponatremia 09/2012    Associated with postural hypotension and confusion  . Hypertension   . Coronary artery disease     a. s/p CABG;  b. LHC (2/11):  LM 30-40, pLAD 95-99 then 80 and  90, mCFX 90, pRCA occluded, S-dRCA ok, S-D1 ok, S-OM patent with 50, L-LAD ok.  Med Rx recommended.  c.  Myoview (5/13):  Low risk with small fixed inf defect c/w scar, no ischemia, EF 48%.  d.  Echo (6/13):  EF 55-60%, inf HK, Gr 1 DD, MAC;  e. Lexiscan Myoview (10/14):  Low risk, EF 38%, inferobasal infarct, no ischemia.  Marland Kitchen Hx of echocardiogram     Echo (10/14):  EF 55-60%, Gr 1DD, MAC, mild MR, mild LAE.   Past Surgical History  Procedure Laterality Date  . Cardiac catheterization  2003    revdeling severe three-vessel disease  . Coronary artery bypass graft      LIMA to LAD, SVG to diagonal, SVG to RCA, and SVG to circumflex  . Polypectomy      colon  . Renal artery stent  01-28-10   Family History  Problem Relation Age of Onset  . Diabetes Mother   . Stroke Mother   . Heart attack Father 19    died  . Colon cancer Other     paternal grandfather   History  Substance Use Topics  . Smoking status: Never Smoker   . Smokeless tobacco: Never Used  . Alcohol Use: No    Review of Systems  Constitutional: Positive for fatigue. Negative for fever, chills and diaphoresis.  HENT: Negative for congestion, rhinorrhea and sneezing.   Eyes: Negative.   Respiratory: Positive for shortness of  breath. Negative for cough and chest tightness.   Cardiovascular: Negative for chest pain and leg swelling.  Gastrointestinal: Positive for nausea. Negative for vomiting, abdominal pain, diarrhea and blood in stool.  Genitourinary: Negative for frequency, hematuria, flank pain and difficulty urinating.  Musculoskeletal: Negative for back pain and arthralgias.  Skin: Negative for rash.  Neurological: Positive for weakness (generalized), light-headedness and headaches. Negative for dizziness, speech difficulty and numbness.  Psychiatric/Behavioral: Positive for confusion.      Allergies  Lisinopril; Zithromax; and Niacin  Home Medications   Prior to Admission medications   Medication Sig Start  Date End Date Taking? Authorizing Provider  aspirin 81 MG tablet Take 81 mg by mouth at bedtime.    Yes Historical Provider, MD  CALCIUM-MAGNESIUM-ZINC PO Take 1 tablet by mouth daily.   Yes Historical Provider, MD  carvedilol (COREG) 6.25 MG tablet Take 1 tablet (6.25 mg total) by mouth 2 (two) times daily with a meal. 12/25/14  Yes Theodis Blaze, MD  cholecalciferol (VITAMIN D) 1000 UNITS tablet Take 2,000 Units by mouth daily.   Yes Historical Provider, MD  CRESTOR 40 MG tablet take 1/2 tablet by mouth once daily 07/19/14  Yes Hendricks Limes, MD  ezetimibe (ZETIA) 10 MG tablet Take 5 mg by mouth daily.   Yes Historical Provider, MD  losartan (COZAAR) 25 MG tablet Take 1 tablet (25 mg total) by mouth daily. 12/25/14  Yes Theodis Blaze, MD  Melatonin 3 MG TBDP Take 1 tablet by mouth at bedtime as needed (sleep). For sleep.   Yes Historical Provider, MD  Omega-3 Fatty Acids (FISH OIL) 1000 MG CAPS Take 1 capsule by mouth daily.     Yes Historical Provider, MD  tamsulosin (FLOMAX) 0.4 MG CAPS capsule take 1 capsule by mouth daily after SUPPER 05/23/14  Yes Hendricks Limes, MD  benzonatate (TESSALON) 100 MG capsule Take 1 capsule (100 mg total) by mouth every 8 (eight) hours. Patient not taking: Reported on 12/23/2014 02/10/14   Leota Jacobsen, MD  NITROSTAT 0.4 MG SL tablet DISSOLVE 1 TAB UNDER TONGUE AS NEEDED FOR CHEST PAIN, MAY REPEAT IN 5 MINUTES AS DIRECTED. ER IF NO BETTER. 01/22/13   Hendricks Limes, MD   BP 152/56 mmHg  Pulse 40  Temp(Src) 97.7 F (36.5 C) (Oral)  Resp 16  Ht 5\' 4"  (1.626 m)  Wt 140 lb (63.504 kg)  BMI 24.02 kg/m2  SpO2 96% Physical Exam  Constitutional: He is oriented to person, place, and time. He appears well-developed and well-nourished.  HENT:  Head: Normocephalic and atraumatic.  Eyes: Pupils are equal, round, and reactive to light.  Neck: Normal range of motion. Neck supple.  Cardiovascular: Normal rate, regular rhythm and normal heart sounds.   Pulmonary/Chest:  Effort normal and breath sounds normal. No respiratory distress. He has no wheezes. He has no rales. He exhibits no tenderness.  Abdominal: Soft. Bowel sounds are normal. There is no tenderness. There is no rebound and no guarding.  Musculoskeletal: Normal range of motion. He exhibits no edema.  Lymphadenopathy:    He has no cervical adenopathy.  Neurological: He is alert and oriented to person, place, and time. He has normal strength. No cranial nerve deficit or sensory deficit. GCS eye subscore is 4. GCS verbal subscore is 5. GCS motor subscore is 6.  Skin: Skin is warm and dry. No rash noted.  Psychiatric: He has a normal mood and affect.    ED Course  Procedures (including critical  care time) Labs Review Results for orders placed or performed during the hospital encounter of 12/30/14  CBC  (at [facility])  Result Value Ref Range   WBC 4.3 4.0 - 10.5 K/uL   RBC 4.57 4.22 - 5.81 MIL/uL   Hemoglobin 13.7 13.0 - 17.0 g/dL   HCT 42.0 39.0 - 52.0 %   MCV 91.9 78.0 - 100.0 fL   MCH 30.0 26.0 - 34.0 pg   MCHC 32.6 30.0 - 36.0 g/dL   RDW 14.1 11.5 - 15.5 %   Platelets 117 (L) 150 - 400 K/uL  Basic metabolic panel  (at [facility])  Result Value Ref Range   Sodium 127 (L) 135 - 145 mmol/L   Potassium 4.3 3.5 - 5.1 mmol/L   Chloride 95 (L) 96 - 112 mEq/L   CO2 23 19 - 32 mmol/L   Glucose, Bld 128 (H) 70 - 99 mg/dL   BUN 21 6 - 23 mg/dL   Creatinine, Ser 0.94 0.50 - 1.35 mg/dL   Calcium 8.6 8.4 - 10.5 mg/dL   GFR calc non Af Amer 74 (L) >90 mL/min   GFR calc Af Amer 85 (L) >90 mL/min   Anion gap 9 5 - 15  Troponin I  Result Value Ref Range   Troponin I <0.03 <0.031 ng/mL  CBG, ED  Result Value Ref Range   Glucose-Capillary 114 (H) 70 - 99 mg/dL   Comment 1 Notify RN    Dg Chest 2 View  12/30/2014   CLINICAL DATA:  Acute onset of bradycardia and weakness. Initial encounter.  EXAM: CHEST  2 VIEW  COMPARISON:  Chest radiograph from 02/18/2014  FINDINGS: The lungs  are well-aerated and clear. There is no evidence of focal opacification, pleural effusion or pneumothorax.  The heart is normal in size; the patient is status post median sternotomy, with evidence of prior CABG. No acute osseous abnormalities are seen.  IMPRESSION: No acute cardiopulmonary process seen.   Electronically Signed   By: Garald Balding M.D.   On: 12/30/2014 23:03   Ct Head Wo Contrast  12/30/2014   CLINICAL DATA:  Acute onset of weakness, dizziness and bradycardia. Initial encounter.  EXAM: CT HEAD WITHOUT CONTRAST  TECHNIQUE: Contiguous axial images were obtained from the base of the skull through the vertex without intravenous contrast.  COMPARISON:  CT of the head performed 10/02/2012  FINDINGS: There is no evidence of acute infarction, mass lesion, or intra- or extra-axial hemorrhage on CT.  Prominence of the ventricles and sulci reflects mild cortical volume loss. Mild cerebellar atrophy is noted. Scattered periventricular and subcortical white matter change likely reflects small vessel ischemic microangiopathy. An apparent chronic lacunar infarct is seen at the superior left basal ganglia.  The brainstem and fourth ventricle are within normal limits. The cerebral hemispheres demonstrate grossly normal gray-white differentiation. No mass effect or midline shift is seen.  There is no evidence of fracture; visualized osseous structures are unremarkable in appearance. The orbits are within normal limits. The paranasal sinuses and mastoid air cells are well-aerated. No significant soft tissue abnormalities are seen.  IMPRESSION: 1. No acute intracranial pathology seen on CT. 2. Mild cortical volume loss and scattered small vessel ischemic microangiopathy. Chronic lacunar infarct at the superior left basal ganglia.   Electronically Signed   By: Garald Balding M.D.   On: 12/30/2014 23:02      Imaging Review Dg Chest 2 View  12/30/2014   CLINICAL DATA:  Acute  onset of bradycardia and weakness.  Initial encounter.  EXAM: CHEST  2 VIEW  COMPARISON:  Chest radiograph from 02/18/2014  FINDINGS: The lungs are well-aerated and clear. There is no evidence of focal opacification, pleural effusion or pneumothorax.  The heart is normal in size; the patient is status post median sternotomy, with evidence of prior CABG. No acute osseous abnormalities are seen.  IMPRESSION: No acute cardiopulmonary process seen.   Electronically Signed   By: Garald Balding M.D.   On: 12/30/2014 23:03   Ct Head Wo Contrast  12/30/2014   CLINICAL DATA:  Acute onset of weakness, dizziness and bradycardia. Initial encounter.  EXAM: CT HEAD WITHOUT CONTRAST  TECHNIQUE: Contiguous axial images were obtained from the base of the skull through the vertex without intravenous contrast.  COMPARISON:  CT of the head performed 10/02/2012  FINDINGS: There is no evidence of acute infarction, mass lesion, or intra- or extra-axial hemorrhage on CT.  Prominence of the ventricles and sulci reflects mild cortical volume loss. Mild cerebellar atrophy is noted. Scattered periventricular and subcortical white matter change likely reflects small vessel ischemic microangiopathy. An apparent chronic lacunar infarct is seen at the superior left basal ganglia.  The brainstem and fourth ventricle are within normal limits. The cerebral hemispheres demonstrate grossly normal gray-white differentiation. No mass effect or midline shift is seen.  There is no evidence of fracture; visualized osseous structures are unremarkable in appearance. The orbits are within normal limits. The paranasal sinuses and mastoid air cells are well-aerated. No significant soft tissue abnormalities are seen.  IMPRESSION: 1. No acute intracranial pathology seen on CT. 2. Mild cortical volume loss and scattered small vessel ischemic microangiopathy. Chronic lacunar infarct at the superior left basal ganglia.   Electronically Signed   By: Garald Balding M.D.   On: 12/30/2014 23:02      EKG Interpretation   Date/Time:  Monday December 30 2014 20:52:10 EST Ventricular Rate:  82 PR Interval:  222 QRS Duration: 96 QT Interval:  377 QTC Calculation: 440 R Axis:   59 Text Interpretation:  Sinus rhythm Ventricular trigeminy Prolonged PR  interval Probable left atrial enlargement Abnormal R-wave progression,  early transition Confirmed by Gregrey Bloyd  MD, Kristine Tiley ([id number]) on 12/30/2014  10:11:13 PM      MDM   Final diagnoses:  Ventricular trigeminy  Hyponatremia    Patient presents with bradycardia and generalized weakness with some dizziness. He was not bradycardic in the ED. Of note on the recorded vital signs there are some bradycardic readings but these are more based on pulse ox readings which are not picking up all the ectopic beats. He is having short runs of V. tach, 4-5 beats. I consulted with the cardiology fellow who is coming to see the patient. Patient is also noted to be hyponatremic and I will start some normal saline.    Malvin Johns, MD 12/31/14 [phone]

## 2014-12-30 NOTE — Telephone Encounter (Signed)
Needs [employer] referral to Dr. Mayer Camel - Heart dr. appt is 2/4 at 12:00

## 2014-12-31 ENCOUNTER — Telehealth: Payer: Self-pay | Admitting: *Deleted

## 2014-12-31 DIAGNOSIS — R008 Other abnormalities of heart beat: Secondary | ICD-10-CM | POA: Diagnosis not present

## 2014-12-31 DIAGNOSIS — E871 Hypo-osmolality and hyponatremia: Secondary | ICD-10-CM

## 2014-12-31 DIAGNOSIS — I701 Atherosclerosis of renal artery: Secondary | ICD-10-CM | POA: Diagnosis not present

## 2014-12-31 DIAGNOSIS — I472 Ventricular tachycardia: Secondary | ICD-10-CM

## 2014-12-31 DIAGNOSIS — I2510 Atherosclerotic heart disease of native coronary artery without angina pectoris: Secondary | ICD-10-CM

## 2014-12-31 DIAGNOSIS — R531 Weakness: Secondary | ICD-10-CM | POA: Diagnosis not present

## 2014-12-31 DIAGNOSIS — I10 Essential (primary) hypertension: Secondary | ICD-10-CM | POA: Diagnosis not present

## 2014-12-31 DIAGNOSIS — I429 Cardiomyopathy, unspecified: Secondary | ICD-10-CM

## 2014-12-31 DIAGNOSIS — R001 Bradycardia, unspecified: Secondary | ICD-10-CM | POA: Diagnosis not present

## 2014-12-31 DIAGNOSIS — E785 Hyperlipidemia, unspecified: Secondary | ICD-10-CM | POA: Diagnosis not present

## 2014-12-31 DIAGNOSIS — F05 Delirium due to known physiological condition: Secondary | ICD-10-CM

## 2014-12-31 DIAGNOSIS — I493 Ventricular premature depolarization: Secondary | ICD-10-CM | POA: Diagnosis present

## 2014-12-31 LAB — CBC
HCT: 41.2 % (ref 39.0–52.0)
Hemoglobin: 14.2 g/dL (ref 13.0–17.0)
MCH: 31.3 pg (ref 26.0–34.0)
MCHC: 34.5 g/dL (ref 30.0–36.0)
MCV: 90.7 fL (ref 78.0–100.0)
PLATELETS: 106 10*3/uL — AB (ref 150–400)
RBC: 4.54 MIL/uL (ref 4.22–5.81)
RDW: 13.9 % (ref 11.5–15.5)
WBC: 4.2 10*3/uL (ref 4.0–10.5)

## 2014-12-31 LAB — BASIC METABOLIC PANEL
Anion gap: 7 (ref 5–15)
BUN: 15 mg/dL (ref 6–23)
CALCIUM: 9.5 mg/dL (ref 8.4–10.5)
CHLORIDE: 104 meq/L (ref 96–112)
CO2: 26 mmol/L (ref 19–32)
Creatinine, Ser: 0.91 mg/dL (ref 0.50–1.35)
GFR, EST AFRICAN AMERICAN: 86 mL/min — AB (ref >90)
GFR, EST NON AFRICAN AMERICAN: 75 mL/min — AB (ref >90)
Glucose, Bld: 108 mg/dL — ABNORMAL HIGH (ref 70–99)
Potassium: 4.4 mmol/L (ref 3.5–5.1)
SODIUM: 137 mmol/L (ref 135–145)

## 2014-12-31 LAB — SODIUM, URINE, RANDOM: Sodium, Ur: 69 mmol/L

## 2014-12-31 LAB — OSMOLALITY, URINE: Osmolality, Ur: 231 mOsm/kg — ABNORMAL LOW (ref 390–1090)

## 2014-12-31 LAB — CORTISOL: Cortisol, Plasma: 10.4 ug/dL

## 2014-12-31 LAB — MAGNESIUM: MAGNESIUM: 2.1 mg/dL (ref 1.5–2.5)

## 2014-12-31 MED ORDER — SODIUM CHLORIDE 0.9 % IJ SOLN: 3.0000 mL | INTRAMUSCULAR | Status: DC | PRN

## 2014-12-31 MED ORDER — LOSARTAN POTASSIUM 25 MG PO TABS
25.0000 mg | ORAL_TABLET | Freq: Every day | ORAL | Status: DC
  Administered 2014-12-31: 25 mg via ORAL
  Filled 2014-12-31: qty 1

## 2014-12-31 MED ORDER — EZETIMIBE 10 MG PO TABS
5.0000 mg | ORAL_TABLET | Freq: Every day | ORAL | Status: DC
  Administered 2014-12-31: 5 mg via ORAL
  Filled 2014-12-31: qty 0.5

## 2014-12-31 MED ORDER — ONDANSETRON HCL 4 MG/2ML IJ SOLN: 4.0000 mg | Freq: Four times a day (QID) | INTRAMUSCULAR | Status: DC | PRN

## 2014-12-31 MED ORDER — ASPIRIN EC 81 MG PO TBEC
81.0000 mg | DELAYED_RELEASE_TABLET | Freq: Every day | ORAL | Status: DC
  Filled 2014-12-31: qty 1

## 2014-12-31 MED ORDER — CARVEDILOL 12.5 MG PO TABS: 12.5000 mg | ORAL_TABLET | Freq: Two times a day (BID) | ORAL | Status: DC

## 2014-12-31 MED ORDER — ROSUVASTATIN CALCIUM 20 MG PO TABS
20.0000 mg | ORAL_TABLET | Freq: Every day | ORAL | Status: DC
  Administered 2014-12-31: 20 mg via ORAL
  Filled 2014-12-31: qty 1

## 2014-12-31 MED ORDER — CARVEDILOL 12.5 MG PO TABS
12.5000 mg | ORAL_TABLET | Freq: Two times a day (BID) | ORAL | Status: DC
  Filled 2014-12-31: qty 1

## 2014-12-31 MED ORDER — TAMSULOSIN HCL 0.4 MG PO CAPS
0.4000 mg | ORAL_CAPSULE | Freq: Every day | ORAL | Status: DC
  Filled 2014-12-31: qty 1

## 2014-12-31 MED ORDER — ACETAMINOPHEN 325 MG PO TABS: 650.0000 mg | ORAL_TABLET | ORAL | Status: DC | PRN

## 2014-12-31 MED ORDER — NITROGLYCERIN 0.4 MG SL SUBL: 0.4000 mg | SUBLINGUAL_TABLET | SUBLINGUAL | Status: DC | PRN

## 2014-12-31 MED ORDER — SODIUM CHLORIDE 0.9 % IJ SOLN
3.0000 mL | Freq: Two times a day (BID) | INTRAMUSCULAR | Status: DC
  Administered 2014-12-31 (×2): 3 mL via INTRAVENOUS

## 2014-12-31 MED ORDER — CARVEDILOL 6.25 MG PO TABS
6.2500 mg | ORAL_TABLET | Freq: Three times a day (TID) | ORAL | Status: DC
  Administered 2014-12-31 (×2): 6.25 mg via ORAL
  Filled 2014-12-31 (×4): qty 1

## 2014-12-31 MED ORDER — SODIUM CHLORIDE 0.9 % IV SOLN: 250.0000 mL | INTRAVENOUS | Status: DC | PRN

## 2014-12-31 MED ORDER — ENOXAPARIN SODIUM 40 MG/0.4ML ~~LOC~~ SOLN
40.0000 mg | Freq: Every day | SUBCUTANEOUS | Status: DC
  Administered 2014-12-31: 40 mg via SUBCUTANEOUS
  Filled 2014-12-31: qty 0.4

## 2014-12-31 NOTE — ED Provider Notes (Signed)
0100 - Dr. Elias Else of [facility] cardiology presented to ED provider room. States he will admit the patient to [facility] for further management and monitoring. Admission orders to be placed by Dr. Elias Else. EMTALA completed for patient transfer; Dr. Elias Else states accepting physician will be Dr. Golden Hurter.  Antonietta Breach, PA-C 12/31/14 0107  Evelina Bucy, MD 12/31/14 [phone]

## 2014-12-31 NOTE — Progress Notes (Signed)
UR completed 

## 2014-12-31 NOTE — Progress Notes (Signed)
Subjective:  Feels good, no CP, no confusion. Wife, son and granddaughter at bedside.   Objective:  Vital Signs in the last 24 hours: Temp:  [97.7 F (36.5 C)-97.9 F (36.6 C)] 97.9 F (36.6 C) (01/12 0321) Pulse Rate:  [32-83] 83 (01/12 0321) Resp:  [15-23] 18 (01/12 0321) BP: (127-158)/(56-96) 148/79 mmHg (01/12 0321) SpO2:  [96 %-100 %] 99 % (01/12 0321) Weight:  [140 lb (63.504 kg)-143 lb 9.6 oz (65.137 kg)] 143 lb 9.6 oz (65.137 kg) (01/12 0321)  Intake/Output from previous day: 01/11 0701 - 01/12 0700 In: -  Out: 885 [Urine:875]   Physical Exam: General: Well developed, elderly, well nourished, in no acute distress. Head:  Normocephalic and atraumatic. Lungs: Clear to auscultation and percussion. Heart: Normal S1 and S2.  No murmur, rubs or gallops.  Abdomen: soft, non-tender, positive bowel sounds. Extremities: No clubbing or cyanosis. No edema. Neurologic: Alert and oriented x 3.    Lab Results:  Recent Labs  12/30/14 2101 12/31/14 0409  WBC 4.3 4.2  HGB 13.7 14.2  PLT 117* 106*    Recent Labs  12/30/14 2101 12/31/14 0409  NA 127* 137  K 4.3 4.4  CL 95* 104  CO2 23 26  GLUCOSE 128* 108*  BUN 21 15  CREATININE 0.94 0.91    Recent Labs  12/30/14 2101  TROPONINI <0.03   Hepatic Function Panel No results for input(s): PROT, ALBUMIN, AST, ALT, ALKPHOS, BILITOT, BILIDIR, IBILI in the last 72 hours. No results for input(s): CHOL in the last 72 hours. No results for input(s): PROTIME in the last 72 hours.  Imaging: Dg Chest 2 View  12/30/2014   CLINICAL DATA:  Acute onset of bradycardia and weakness. Initial encounter.  EXAM: CHEST  2 VIEW  COMPARISON:  Chest radiograph from 02/18/2014  FINDINGS: The lungs are well-aerated and clear. There is no evidence of focal opacification, pleural effusion or pneumothorax.  The heart is normal in size; the patient is status post median sternotomy, with evidence of prior CABG. No acute osseous abnormalities  are seen.  IMPRESSION: No acute cardiopulmonary process seen.   Electronically Signed   By: Garald Balding M.D.   On: 12/30/2014 23:03   Ct Head Wo Contrast  12/30/2014   CLINICAL DATA:  Acute onset of weakness, dizziness and bradycardia. Initial encounter.  EXAM: CT HEAD WITHOUT CONTRAST  TECHNIQUE: Contiguous axial images were obtained from the base of the skull through the vertex without intravenous contrast.  COMPARISON:  CT of the head performed 10/02/2012  FINDINGS: There is no evidence of acute infarction, mass lesion, or intra- or extra-axial hemorrhage on CT.  Prominence of the ventricles and sulci reflects mild cortical volume loss. Mild cerebellar atrophy is noted. Scattered periventricular and subcortical white matter change likely reflects small vessel ischemic microangiopathy. An apparent chronic lacunar infarct is seen at the superior left basal ganglia.  The brainstem and fourth ventricle are within normal limits. The cerebral hemispheres demonstrate grossly normal gray-white differentiation. No mass effect or midline shift is seen.  There is no evidence of fracture; visualized osseous structures are unremarkable in appearance. The orbits are within normal limits. The paranasal sinuses and mastoid air cells are well-aerated. No significant soft tissue abnormalities are seen.  IMPRESSION: 1. No acute intracranial pathology seen on CT. 2. Mild cortical volume loss and scattered small vessel ischemic microangiopathy. Chronic lacunar infarct at the superior left basal ganglia.   Electronically Signed   By: Francoise Schaumann.D.  On: 12/30/2014 23:02   Personally viewed.   Telemetry: occasionial PVC, trigem Personally viewed.   Assessment/Plan:    Problem List 1. Lightheadedness 2. Trigeminy, frequent ectopy and 5 beats of NSVT 3. CAD s/p CABG 4. Cardiomyopathy, recently discovered with EF of 35-40%, currently euvolemic 5. Hyponatremia 6. Brief episode of confusion, resolved 7.  HTN  ASSESSMENT AND PLAN:  81 y.o. male w/ PMHx significant for CAD s/p CABG, cardiomyopathy with reduced EF by echo 35-40%, who presented to [facility] on 12/31/2014 with complaints of fatigue, lightheadedness and brief confusion --> found to be in trigeminy with frequent ectopy and runs of 5 beats NSVT and hyponatremic (has had chronically since last summer)  Doubt that trigeminy arrhythmia is contributing to his fatigue and lightheadedness (though this is likely multifactorial in nature/ hyponatremia).  TSH checked at last hospitalization also normal. Neg troponin. Does not appear to be in CHF, currently euvolemic.   Will titrate up his carvedilol to 6.25 BID --> 12.5 BID in an effort to suppress. If continues, could consider addition of anti-arrhythmic (amiodarone) though this would last resort if BB fail. Continue losartan, statin, aspirin. Pulse is low on BP monitor at times due to Trigeminy (non conducted beats).   Regarding his brief confusion, exam is benign. Head CT negative for acute changes though some lacunar disease which may be contributing. Also his hyponatremia could be contributing (notes indicate that this was a problem in the past, unsure what was felt to be the cause). Again, appears euvolemic (maybe dry, was given 500 ccs NS) and is not on any diuretics, so could be due to SIADH. Has PCP follow up Thursday to discuss further.   Finally, his family is starting to think about transitioning to assisted living and this may be a good time to start making that transition.   OK to DC home.   Suezette Lafave, [place] 12/31/2014, 1:31 PM

## 2014-12-31 NOTE — Discharge Instructions (Signed)
Hyponatremia  Hyponatremia is when the salt (sodium) in your blood is low. When salt becomes low, your cells take in extra water and puff up (swell). The puffiness can happen in the whole body. It mostly affects the brain and is very serious.  HOME CARE  Only take medicine as told by your doctor.  Follow any diet instructions you were given. This includes limiting how much fluid you drink.  Keep all doctor visits for tests as told.  Avoid alcohol and drugs. GET HELP RIGHT AWAY IF:  You start to twitch and shake (seize).  You pass out (faint).  You continue to have watery poop (diarrhea) or you throw up (vomit).  You feel sick to your stomach (nauseous).  You are tired (fatigued), have a headache, are confused, or feel weak.  Your problems that first brought you to the doctor come back.  You have trouble following your diet instructions. MAKE SURE YOU:   Understand these instructions.  Will watch your condition.  Will get help right away if you are not doing well or get worse. Document Released: 08/18/2011 Document Revised: 02/28/2012 Document Reviewed: 08/18/2011 [facility] Patient Information 2015 [place], [place]. This information is not intended to replace advice given to you by your health care provider. Make sure you discuss any questions you have with your health care provider.  Premature Ventricular Contraction Premature ventricular contraction (PVC) is an irregularity of the heart rhythm involving extra or skipped heartbeats. In some cases, they may occur without obvious cause or heart disease. Other times, they can be caused by an electrolyte change in the blood. These need to be corrected. They can also be seen when there is not enough oxygen going to the heart. A common cause of this is plaque or cholesterol buildup. This buildup decreases the blood supply to the heart. In addition, extra beats may be caused or aggravated by:  Excessive smoking.  Alcohol  consumption.  Caffeine.  Certain medications  Some street drugs. SYMPTOMS   The sensation of feeling your heart skipping a beat (palpitations).  In many cases, the person may have no symptoms. SIGNS AND TESTS   A physical examination may show an occasional irregularity, but if the PVC beats do not happen often, they may not be found on physical exam.  Blood pressure is usually normal.  Other tests that may find extra beats of the heart are:  An EKG (electrocardiogram)  A Holter monitor which can monitor your heart over longer periods of time  An Angiogram (study of the heart arteries). TREATMENT  Usually extra heartbeats do not need treatment. The condition is treated only if symptoms are severe or if extra beats are very frequent or are causing problems. An underlying cause, if discovered, may also require treatment.  Treatment may also be needed if there may be a risk for other more serious cardiac arrhythmias.  PREVENTION   Moderation in caffeine, alcohol, and tobacco use may reduce the risk of ectopic heartbeats in some people.  Exercise often helps people who lead a sedentary (inactive) lifestyle. PROGNOSIS  PVC heartbeats are generally harmless and do not need treatment.  RISKS AND COMPLICATIONS   Ventricular tachycardia (occasionally).  There usually are no complications.  Other arrhythmias (occasionally). SEEK IMMEDIATE MEDICAL CARE IF:   You feel palpitations that are frequent or continual.  You develop chest pain or other problems such as shortness of breath, sweating, or nausea and vomiting.  You become light-headed or faint (pass out).  You get  worse or do not improve with treatment. Document Released: 07/23/2004 Document Revised: 02/28/2012 Document Reviewed: 02/02/2008 [facility] Patient Information 2015 [place], [place]. This information is not intended to replace advice given to you by your health care provider. Make sure you discuss any questions you  have with your health care provider.

## 2014-12-31 NOTE — Progress Notes (Signed)
Discharge instructions given. Pt verbalized understanding and all questions were answered.  

## 2014-12-31 NOTE — H&P (Signed)
History and Physical  Patient ID: Victor Waters MRN: [id number], SOB: 30-Dec-1925 81 y.o. Date of Encounter: 12/31/2014, 1:21 AM  Primary Physician: Unice Cobble, MD Primary Cardiologist: Dr. Percival Spanish  Chief Complaint: weakness  HPI: 81 y.o. male w/ PMHx significant for CAD s/p CABG, cardiomyopathy with reduced EF by echo 35-40%, who presented to [facility] on 12/31/2014 with complaints of fatigue, lightheadedness and brief confusion. History is provided by patient, wife and daughter. Patient was recently hospitalized with very similar presentation a week ago when he was found to be in trigeminy rhythm. Similar to before, he had symptoms of lightheadedness but no syncope. He reports he was doing reasonably well after the hospitalization and was able to perform his ADLs of cooking and walking the dog, though with mild fatigue and shortness of breath. Today, at the store, had the onset of more severe lightheadedness and sought emergent care. His daughter also reports mild confusion and disorientation at home briefly. No evidence of facial droop, localized weakness or vision issues. No chest pain, PND, orthopnea, LE edema. Took his blood pressure and it read 150/70 but with a pulse of 40 prompting ER visit.  Currently his mental status is back to baseline. Wife admits that they are thinking about assisted living.  EKG revealed NSR with trigeminal pattern. CXR was without acute cardiopulmonary abnormalities. Labs are significant for Na of 127 down from 134.  No ETOH, illicits. 1/2 cup coffee per day.  On telemetry, he is have trigeminy but also with frequent ectopy and 5 beat runs of NSVT.  At the last hospitalization, workup revealed the reduced EF (55 --> 40%) and his coreg was increased to suppress the beats and losartan was added to his regimen.   Past Medical History  Diagnosis Date  . Myocardial infarction 2003    hx of   . Hyperlipidemia   . Anxiety   . Fatigue   .  Insomnia   . Renal artery stenosis     bilateral;  s/p R RA stent 2011  . Hyponatremia 09/2012    Associated with postural hypotension and confusion  . Hypertension   . Coronary artery disease     a. s/p CABG;  b. LHC (2/11):  LM 30-40, pLAD 95-99 then 80 and 90, mCFX 90, pRCA occluded, S-dRCA ok, S-D1 ok, S-OM patent with 50, L-LAD ok.  Med Rx recommended.  c.  Myoview (5/13):  Low risk with small fixed inf defect c/w scar, no ischemia, EF 48%.  d.  Echo (6/13):  EF 55-60%, inf HK, Gr 1 DD, MAC;  e. Lexiscan Myoview (10/14):  Low risk, EF 38%, inferobasal infarct, no ischemia.  Marland Kitchen Hx of echocardiogram     Echo (10/14):  EF 55-60%, Gr 1DD, MAC, mild MR, mild LAE.     Surgical History:  Past Surgical History  Procedure Laterality Date  . Cardiac catheterization  2003    revdeling severe three-vessel disease  . Coronary artery bypass graft      LIMA to LAD, SVG to diagonal, SVG to RCA, and SVG to circumflex  . Polypectomy      colon  . Renal artery stent  01-28-10     Home Meds: Prior to Admission medications   Medication Sig Start Date End Date Taking? Authorizing Provider  aspirin 81 MG tablet Take 81 mg by mouth at bedtime.    Yes Historical Provider, MD  CALCIUM-MAGNESIUM-ZINC PO Take 1 tablet by mouth daily.   Yes Historical Provider, MD  carvedilol (COREG) 6.25 MG tablet Take 1 tablet (6.25 mg total) by mouth 2 (two) times daily with a meal. 12/25/14  Yes Theodis Blaze, MD  cholecalciferol (VITAMIN D) 1000 UNITS tablet Take 2,000 Units by mouth daily.   Yes Historical Provider, MD  CRESTOR 40 MG tablet take 1/2 tablet by mouth once daily 07/19/14  Yes Hendricks Limes, MD  ezetimibe (ZETIA) 10 MG tablet Take 5 mg by mouth daily.   Yes Historical Provider, MD  losartan (COZAAR) 25 MG tablet Take 1 tablet (25 mg total) by mouth daily. 12/25/14  Yes Theodis Blaze, MD  Melatonin 3 MG TBDP Take 1 tablet by mouth at bedtime as needed (sleep). For sleep.   Yes Historical Provider, MD  Omega-3  Fatty Acids (FISH OIL) 1000 MG CAPS Take 1 capsule by mouth daily.     Yes Historical Provider, MD  tamsulosin (FLOMAX) 0.4 MG CAPS capsule take 1 capsule by mouth daily after SUPPER 05/23/14  Yes Hendricks Limes, MD  benzonatate (TESSALON) 100 MG capsule Take 1 capsule (100 mg total) by mouth every 8 (eight) hours. Patient not taking: Reported on 12/23/2014 02/10/14   Leota Jacobsen, MD  NITROSTAT 0.4 MG SL tablet DISSOLVE 1 TAB UNDER TONGUE AS NEEDED FOR CHEST PAIN, MAY REPEAT IN 5 MINUTES AS DIRECTED. ER IF NO BETTER. 01/22/13   Hendricks Limes, MD    Allergies:  Allergies  Allergen Reactions  . Lisinopril Cough    Renal artery stenosis by history; ACE inhibitor  would be relatively contraindicated  . Zithromax [Azithromycin]     02/15/14 N&V  . Niacin Other (See Comments)    Burning sensations in head    History   Social History  . Marital Status: Married    Spouse Name: N/A    Number of Children: N/A  . Years of Education: N/A   Occupational History  . Not on file.   Social History Main Topics  . Smoking status: Never Smoker   . Smokeless tobacco: Never Used  . Alcohol Use: No  . Drug Use: No  . Sexual Activity: Not on file   Other Topics Concern  . Not on file   Social History Narrative   Married   Lives with wife and 62year old ggd     Family History  Problem Relation Age of Onset  . Diabetes Mother   . Stroke Mother   . Heart attack Father 81    died  . Colon cancer Other     paternal grandfather    Review of Systems: General: negative for chills, fever, night sweats or weight changes.  Cardiovascular: see HPI Dermatological: negative for rash Respiratory: negative for cough or wheezing Urologic: negative for hematuria Abdominal: negative for nausea, vomiting, diarrhea, bright red blood per rectum, melena, or hematemesis Neurologic: negative for visual changes, syncope, . +lightheadedness All other systems reviewed and are otherwise negative except as  noted above.  Labs:   Lab Results  Component Value Date   WBC 4.3 12/30/2014   HGB 13.7 12/30/2014   HCT 42.0 12/30/2014   MCV 91.9 12/30/2014   PLT 117* 12/30/2014    Recent Labs Lab 12/30/14 2101  NA 127*  K 4.3  CL 95*  CO2 23  BUN 21  CREATININE 0.94  CALCIUM 8.6  GLUCOSE 128*    Recent Labs  12/30/14 2101  TROPONINI <0.03   Lab Results  Component Value Date   CHOL 116 09/20/2013   HDL  39.00* 09/20/2013   LDLCALC 63 09/20/2013   TRIG 69.0 09/20/2013   No results found for: DDIMER  Radiology/Studies:  Dg Chest 2 View  12/30/2014   CLINICAL DATA:  Acute onset of bradycardia and weakness. Initial encounter.  EXAM: CHEST  2 VIEW  COMPARISON:  Chest radiograph from 02/18/2014  FINDINGS: The lungs are well-aerated and clear. There is no evidence of focal opacification, pleural effusion or pneumothorax.  The heart is normal in size; the patient is status post median sternotomy, with evidence of prior CABG. No acute osseous abnormalities are seen.  IMPRESSION: No acute cardiopulmonary process seen.   Electronically Signed   By: Garald Balding M.D.   On: 12/30/2014 23:03   Ct Head Wo Contrast  12/30/2014   CLINICAL DATA:  Acute onset of weakness, dizziness and bradycardia. Initial encounter.  EXAM: CT HEAD WITHOUT CONTRAST  TECHNIQUE: Contiguous axial images were obtained from the base of the skull through the vertex without intravenous contrast.  COMPARISON:  CT of the head performed 10/02/2012  FINDINGS: There is no evidence of acute infarction, mass lesion, or intra- or extra-axial hemorrhage on CT.  Prominence of the ventricles and sulci reflects mild cortical volume loss. Mild cerebellar atrophy is noted. Scattered periventricular and subcortical white matter change likely reflects small vessel ischemic microangiopathy. An apparent chronic lacunar infarct is seen at the superior left basal ganglia.  The brainstem and fourth ventricle are within normal limits. The cerebral  hemispheres demonstrate grossly normal gray-white differentiation. No mass effect or midline shift is seen.  There is no evidence of fracture; visualized osseous structures are unremarkable in appearance. The orbits are within normal limits. The paranasal sinuses and mastoid air cells are well-aerated. No significant soft tissue abnormalities are seen.  IMPRESSION: 1. No acute intracranial pathology seen on CT. 2. Mild cortical volume loss and scattered small vessel ischemic microangiopathy. Chronic lacunar infarct at the superior left basal ganglia.   Electronically Signed   By: Garald Balding M.D.   On: 12/30/2014 23:02     EKG: see HPI  Physical Exam: Blood pressure 152/56, pulse 40, temperature 97.7 F (36.5 C), temperature source Oral, resp. rate 16, height 5\' 4"  (1.626 m), weight 63.504 kg (140 lb), SpO2 96 %. General: in no acute distress, thin, elderly appearing Head: Normocephalic, atraumatic, sclera non-icteric, nares are without discharge Neck: Supple. Negative for carotid bruits. JVD not elevated. Lungs: Clear bilaterally to auscultation without wheezes, rales, or rhonchi. Breathing is unlabored. Heart: RRR witih ectopy with S1 S2. No murmurs, rubs, or gallops appreciated. Abdomen: Soft, non-tender, non-distended with normoactive bowel sounds. No rebound/guarding. No obvious abdominal masses. Msk:  Strength and tone appear normal for age., 5/5 in upper and lower extremities Extremities: No edema. No clubbing or cyanosis. Distal pedal pulses are 2+ and equal bilaterally. Neuro: Alert and oriented X 3. Moves all extremities spontaneously. Psych:  Responds to questions appropriately with a normal affect.   Problem List 1. Lightheadedness 2. Trigeminy, frequent ectopy and 5 beats of NSVT 3. CAD s/p CABG 4. Cardiomyopathy, recently discovered with EF of 35-40%, currently euvolemic 5. Hyponatremia 6. Brief episode of confusion, resolved 7. HTN  ASSESSMENT AND PLAN:  81 y.o. male w/  PMHx significant for CAD s/p CABG, cardiomyopathy with reduced EF by echo 35-40%, who presented to [facility] on 12/31/2014 with complaints of fatigue, lightheadedness and brief confusion --> found to be in trigeminy with frequent ectopy and runs of 5 beats NSVT and hyponatremic.  Goals of  hospitalization are to investigate triggers of his trigeminy and his excessive ectopy and titrate medications to help further suppress this arrhythmia which may be contributing to his fatigue and lightheadedness (though this is likely multifactorial in nature). Electrolytes wnl, TSH checked at last hospitalization also normal. Neg troponin. Does not appear to be in CHF, currently euvolemic. Will titrate up his carvedilol to 6.25 BID --> TID in an effort to suppress. If continues, could consider addition of anti-arrhythmic (amiodarone) though this would last resort if BB fail. Continue losartan, statin, aspirin.  Regarding his brief confusion, exam is currently benign. Head CT negative for acute changes though some lacunar disease which may be contributing. Also his hyponatremia could be contributing (notes indicate that this was a problem in the past, unsure what was felt to be the cause). Again, appears euvolemic (maybe dry, was given 500 ccs NS) and is not on any diuretics, so could be due to SIADH. Check urine Na and Osms, cortisol. Recheck lytes in the AM.  Finally, his family is starting to think about transitioning to ALF and this may be a good time to start making that transition. Consider PT/OT evaluation.  Full code. Normal diet (may need to encourage NaCl) lovenox prophylaxis.  Signed, Kastiel Simonian C. MD 12/31/2014, 1:21 AM

## 2014-12-31 NOTE — Telephone Encounter (Signed)
[place] Night - Client TELEPHONE ADVICE RECORD [facility] Patient Name: Victor Waters Gender: Male DOB: May 30, 1926 Age: 81 Y 9 M 26 D Return Phone Number: [phone] (Primary), [phone] (Secondary) Address: City/State/Zip: [place] Client [facility] Night - Client Client Site [place] - Night Physician [place], [place] Type Call Call Type Triage / Clinical Caller Name Jovita Gamma Relationship To Patient Grandchild Return Phone Number [phone] (Primary) Chief Complaint Weakness, Generalized Initial Comment caller states pt has BP 152/72, pulse 42; general weakness, and a headache PreDisposition 911 Nurse Assessment Nurse: Rosalia Hammers, RN, Maylene Roes Date/Time (Eastern Time): 12/30/2014 7:55:10 PM Confirm and document reason for call. If symptomatic, describe symptoms. ---Caller states pt has BP 152/72, pulse 42; general weakness, and pressure on his head. Pt taking Coreg 6.25 which is double the dose he was taking a week ago per MD order. Has the patient traveled out of the country within the last 30 days? ---No Does the patient require triage? ---Yes Related visit to physician within the last 2 weeks? ---Yes Does the PT have any chronic conditions? (i.e. diabetes, asthma, etc.) ---Yes List chronic conditions. ---Tipple bypass, HTN. Guidelines Guideline Title Affirmed Question Affirmed Notes Nurse Date/Time (Eastern Time) Weakness (Generalized) and Fatigue Heart beating < 50 beats per minute OR > 140 beats per minute HR now is 44 per caller. Rosalia Hammers, [place], [facility] 12/30/2014 7:58:07 PM Disp. Time Eilene Ghazi Time) Disposition Final User 12/30/2014 7:26:57 PM Send To Clinical Follow Up Queue Sherrilyn Rist 12/30/2014 8:02:54 PM Go to ED Now Yes Rosalia Hammers, RN, Lexine Baton Understands: Yes Disagree/Comply: Comply PLEASE NOTE: All timestamps contained within this report are represented as [place] Standard  Time. CONFIDENTIALTY NOTICE: This fax transmission is intended only for the addressee. It contains information that is legally privileged, confidential or otherwise protected from use or disclosure. If you are not the intended recipient, you are strictly prohibited from reviewing, disclosing, copying using or disseminating any of this information or taking any action in reliance on or regarding this information. If you have received this fax in error, please notify us immediately by telephone so that we can arrange for its return to [place]. Phone: [phone], Toll-Free: [phone], Fax: [phone] Page: 2 of 2 Call Id: [id number] Care Advice Given Per Guideline GO TO ED NOW: You need to be seen in the Emergency Department. Go to the ER at ___________ [place] now. Drive carefully. NOTE TO TRIAGER - DRIVING: * Another adult should drive. * If immediate transportation is not available via car or taxi, then the patient should be instructed to call [id number]. BRING MEDICINES: * Please bring a list of your current medicines when you go to the Emergency Department (ER). * It is also a good idea to bring the pill bottles too. This will help the doctor to make certain you are taking the right medicines and the right dose. CARE ADVICE given per Weakness and Fatigue (Adult) guideline. After Care Instructions Given Call Event Type User Date / Time Description Referrals [facility] - ED

## 2014-12-31 NOTE — Discharge Summary (Signed)
Discharge Summary   Patient ID: Victor Waters,  MRN: [id number], DOB/AGE: 06-03-1926 81 y.o.  Admit date: 12/30/2014 Discharge date: 12/31/2014  Primary Care Provider: Unice Cobble Primary Cardiologist: Dr. Percival Spanish  Discharge Diagnoses Principal Problem:   Weakness Active Problems:   HYPERLIPIDEMIA   Thrombocytopenia   Essential hypertension   Coronary atherosclerosis   RENAL ARTERY STENOSIS   DIZZINESS   Ventricular ectopy   Acute confusional state   Allergies Allergies  Allergen Reactions  . Lisinopril Cough    Renal artery stenosis by history; ACE inhibitor  would be relatively contraindicated  . Zithromax [Azithromycin]     02/15/14 N&V  . Niacin Other (See Comments)    Burning sensations in head     Hospital Course  The patient is a 81 year old male with past medical history of CAD status post CABG, cardiomyopathy with EF 35-40% who presented to [facility] on the night of 12/30/2014 with complaint of chest pain, lightheadedness and brief confusion. He was recently hospitalized with very similar presentation over a week ago when she was found to be in trigeminy rhythm. On presentation, patient was noted to be in trigeminy with frequent ectopy and 5 beats run of nonsustained VT. On arrival, laboratory finding include sodium 127, potassium 4.3, negative troponin, magnesium 2.1, platelet 117. TSH was checked at last hospitalization which was normal. Patient appears to be euvolemic and not in any heart failure at this time. Chest x-ray was negative for acute process. She was admitted for observation overnight. His carvedilol 6.25 BID was up titrated to 6.25 TID in an effort to suppress ventricular ectopy. At this time, his family started to think about transitioning to assisted living facility.  He was seen in the afternoon of 12/31/2014 at which time he was feeling good without any chest pain or confusion. His Coreg will be increased to 12.5 mg in continuous  effort to suppress ventricular ectopy. We could consider addition of antiarrhythmic medication if up titration of beta blocker fails in the future. He is deemed stable for discharge from cardiology perspective. Head CT on arrival was negative for acute process except some chronic lacunar infarct which may be contributing to his symptom. His sodium on arrival was low at 127 which may also contribute to his symptom. At this time repeat labs shows his sodium has came back up to 137. He has a PCP follow-up this coming Thursday at which time further workup may be warranted for hyponatremia. At this time, it is very unlikely that his trigeminy is responsible for the brief confusion.    Discharge Vitals Blood pressure 148/79, pulse 83, temperature 97.9 F (36.6 C), temperature source Oral, resp. rate 18, height 5\' 4"  (1.626 m), weight 143 lb 9.6 oz (65.137 kg), SpO2 99 %.  Filed Weights   12/30/14 2047 12/31/14 0321  Weight: 140 lb (63.504 kg) 143 lb 9.6 oz (65.137 kg)    Labs  CBC  Recent Labs  12/30/14 2101 12/31/14 0409  WBC 4.3 4.2  HGB 13.7 14.2  HCT 42.0 41.2  MCV 91.9 90.7  PLT 117* 269*   Basic Metabolic Panel  Recent Labs  12/30/14 2101 12/31/14 0409  NA 127* 137  K 4.3 4.4  CL 95* 104  CO2 23 26  GLUCOSE 128* 108*  BUN 21 15  CREATININE 0.94 0.91  CALCIUM 8.6 9.5  MG 2.1  --    Cardiac Enzymes  Recent Labs  12/30/14 2101  TROPONINI <0.03    Disposition  Pt  is being discharged home today in good condition.  Follow-up Plans & Appointments      Follow-up Information    Follow up with Unice Cobble, MD On 01/02/2015.   Specialty:  Internal Medicine   Why:  2:30pm   Contact information:   [address] [address] [phone]       Follow up with Minus Breeding, MD On 01/23/2015.   Specialty:  Cardiology   Why:  12:00pm   Contact information:   [address] [place] [address] [phone]       Discharge  Medications    Medication List    STOP taking these medications        benzonatate 100 MG capsule  Commonly known as:  TESSALON      TAKE these medications        aspirin 81 MG tablet  Take 81 mg by mouth at bedtime.     CALCIUM-MAGNESIUM-ZINC PO  Take 1 tablet by mouth daily.     carvedilol 12.5 MG tablet  Commonly known as:  COREG  Take 1 tablet (12.5 mg total) by mouth 2 (two) times daily.     cholecalciferol 1000 UNITS tablet  Commonly known as:  VITAMIN D  Take 2,000 Units by mouth daily.     CRESTOR 40 MG tablet  Generic drug:  rosuvastatin  take 1/2 tablet by mouth once daily     ezetimibe 10 MG tablet  Commonly known as:  ZETIA  Take 5 mg by mouth daily.     Fish Oil 1000 MG Caps  Take 1 capsule by mouth daily.     losartan 25 MG tablet  Commonly known as:  COZAAR  Take 1 tablet (25 mg total) by mouth daily.     Melatonin 3 MG Tbdp  Take 1 tablet by mouth at bedtime as needed (sleep). For sleep.     NITROSTAT 0.4 MG SL tablet  Generic drug:  nitroGLYCERIN  DISSOLVE 1 TAB UNDER TONGUE AS NEEDED FOR CHEST PAIN, MAY REPEAT IN 5 MINUTES AS DIRECTED. ER IF NO BETTER.     tamsulosin 0.4 MG Caps capsule  Commonly known as:  FLOMAX  take 1 capsule by mouth daily after SUPPER        Outstanding Labs/Studies  Followup with PCP with regard to hyponatremia  Duration of Discharge Encounter   Greater than 30 minutes including physician time.  Hilbert Corrigan PA-C Pager: [phone] 12/31/2014, 2:28 PM

## 2015-01-01 NOTE — Telephone Encounter (Signed)
[employer] auth # [id number] valid 01/23/15-07/22/15 for 6 visits

## 2015-01-02 ENCOUNTER — Other Ambulatory Visit (INDEPENDENT_AMBULATORY_CARE_PROVIDER_SITE_OTHER): Payer: Medicare HMO

## 2015-01-02 ENCOUNTER — Encounter: Payer: Self-pay | Admitting: Internal Medicine

## 2015-01-02 ENCOUNTER — Ambulatory Visit (INDEPENDENT_AMBULATORY_CARE_PROVIDER_SITE_OTHER): Payer: Commercial Managed Care - HMO | Admitting: Internal Medicine

## 2015-01-02 VITALS — BP 110/58 | HR 82 | Temp 97.6°F | Resp 16 | Ht 64.0 in | Wt 152.1 lb

## 2015-01-02 DIAGNOSIS — I10 Essential (primary) hypertension: Secondary | ICD-10-CM | POA: Diagnosis not present

## 2015-01-02 DIAGNOSIS — I493 Ventricular premature depolarization: Secondary | ICD-10-CM

## 2015-01-02 DIAGNOSIS — R739 Hyperglycemia, unspecified: Secondary | ICD-10-CM

## 2015-01-02 DIAGNOSIS — D696 Thrombocytopenia, unspecified: Secondary | ICD-10-CM | POA: Diagnosis not present

## 2015-01-02 DIAGNOSIS — E871 Hypo-osmolality and hyponatremia: Secondary | ICD-10-CM | POA: Diagnosis not present

## 2015-01-02 LAB — HEMOGLOBIN A1C: HEMOGLOBIN A1C: 6.6 % — AB (ref 4.6–6.5)

## 2015-01-02 NOTE — Patient Instructions (Addendum)
Because of low platelet count or clotting elements, avoid excess aspirin , ibuprofen, naproxen etc .Repeat platelet count if  any abnormal bruising or bleeding occur.  Minimal Blood Pressure Goal= AVERAGE < 140/90;  Ideal is an AVERAGE < 135/85. This AVERAGE should be calculated from @ least 5-7 BP readings taken @ different times of day on different days of week. You should not respond to isolated BP readings , but rather the AVERAGE for that week .Please bring your  blood pressure cuff to office visits to verify that it is reliable.It  can also be checked against the blood pressure device at the pharmacy. Finger or wrist cuffs are not dependable; an arm cuff is.  Please consider the information we discussed about completing a healthcare power of attorney and living will. This is critical to make your wishes known as to care interventions if hospitalized. This is not to restrict care but to provide optimal care without physical, emotional, financial loss or abuse to you and your loved ones.

## 2015-01-02 NOTE — Assessment & Plan Note (Signed)
Blood pressure goals reviewed.  

## 2015-01-02 NOTE — Assessment & Plan Note (Signed)
Avoid excess aspirin , ibuprofen, naproxen etc .Repeat platelet count if  any abnormal bruising or bleeding occur.

## 2015-01-02 NOTE — Progress Notes (Signed)
Pre visit review using our clinic review tool, if applicable. No additional management support is needed unless otherwise documented below in the visit note. 

## 2015-01-02 NOTE — Progress Notes (Signed)
Subjective:    Patient ID: Victor Waters, male    DOB: May 29, 1926, 81 y.o.   MRN: [id number]  HPI  His  recent hospitalization records were reviewed. He was observed 1/11-1/12 following presentation with headache and weakness. He had transient mental status changes. CT scan revealed no acute changes. He was found to have a sodium of 127. He was told to supplement his sodium with beverages such as Gatorade.  His carvedilol was increased to 12.5 mg twice a day to decrease ventricular ectopy.  He been hospitalized 1/4-1 1/6 with weakness and palpitations. He was found to have PVCs and trigeminy.  He also has had thrombocytopenia with a platelet count ranging from 106,000 -127,000. He has no bleeding dyscrasias  Glucoses were 92-128.  Since discharge he's had no active cardiopulmonary symptoms.  At home he does not add salt to food at the table. He also restricts sugar but has a "sweet tooth". Blood pressure has been averaging 148-152/72 since discharge.  Review of Systems   Chest pain, palpitations, tachycardia, exertional dyspnea, paroxysmal nocturnal dyspnea, claudication or edema are absent.  Epistaxis, hemoptysis, hematuria, melena, or rectal bleeding denied. No unexplained weight loss, significant dyspepsia,dysphagia, or abdominal pain.  There is no abnormal bruising , bleeding, or difficulty stopping bleeding with injury.    Objective:   Physical Exam  Gen.: Healthy and well-nourished in appearance. Alert, appropriate and cooperative throughout exam. Appears younger than stated age  Head: Normocephalic without obvious abnormalities; pattern alopecia . Moustache Eyes: No corneal or conjunctival inflammation noted. Pupils equal round reactive to light and accommodation. Extraocular motion intact.  Ears: External  ear exam reveals no significant lesions or deformities. Canals clear .TMs normal. Hearing is grossly normal bilaterally. Nose: External nasal exam reveals no  deformity or inflammation. Nasal mucosa are pink and moist. No lesions or exudates noted.   Mouth: Oral mucosa and oropharynx reveal no lesions or exudates. Teeth in good repair. Neck: No deformities, masses, or tenderness noted. Range of motion&. Thyroid normal. Lungs: Normal respiratory effort; chest expands symmetrically. Lungs are clear to auscultation without rales, wheezes, or increased work of breathing. Heart: Normal rate and rhythm. Intermittent S4 & splitting of S2. No gallop, click, or rub. No murmur. Abdomen: Bowel sounds normal; abdomen soft and nontender. No masses, organomegaly or hernias noted.                                Musculoskeletal/extremities: No deformity or scoliosis noted of  the thoracic or lumbar spine.  No clubbing, cyanosis, edema, or significant extremity  deformity noted.  Range of motion normal . Tone & strength normal. Hands reveal R 5th flexion contracture Fingernail  health good. Crepitus of knees  Able to lie down & sit up w/o help.  Negative SLR bilaterally Vascular: Carotid, radial artery, dorsalis pedis and  posterior tibial pulses are full and equal. No bruits present. Neurologic: Alert and oriented x3. Deep tendon reflexes symmetrical and normal.  Gait normal   Skin: Intact without suspicious lesions or rashes. Lymph: No cervical, axillary lymphadenopathy present. Psych: Mood and affect are normal. Normally interactive  Assessment & Plan:  See Current Assessment & Plan in Problem List under specific Diagnosis

## 2015-01-02 NOTE — Assessment & Plan Note (Addendum)
Use Gatorade Lite instead of Gatorade because of hyperglycemia

## 2015-01-23 ENCOUNTER — Ambulatory Visit (INDEPENDENT_AMBULATORY_CARE_PROVIDER_SITE_OTHER): Payer: Medicare HMO | Admitting: Cardiology

## 2015-01-23 ENCOUNTER — Encounter (INDEPENDENT_AMBULATORY_CARE_PROVIDER_SITE_OTHER): Payer: Commercial Managed Care - HMO

## 2015-01-23 ENCOUNTER — Encounter: Payer: Self-pay | Admitting: *Deleted

## 2015-01-23 ENCOUNTER — Encounter: Payer: Self-pay | Admitting: Cardiology

## 2015-01-23 VITALS — BP 100/82 | HR 50 | Ht 64.0 in | Wt 148.1 lb

## 2015-01-23 DIAGNOSIS — R002 Palpitations: Secondary | ICD-10-CM

## 2015-01-23 DIAGNOSIS — I2510 Atherosclerotic heart disease of native coronary artery without angina pectoris: Secondary | ICD-10-CM | POA: Diagnosis not present

## 2015-01-23 DIAGNOSIS — I493 Ventricular premature depolarization: Secondary | ICD-10-CM

## 2015-01-23 NOTE — Progress Notes (Signed)
Patient ID: Victor Waters, male   DOB: 1926/01/22, 81 y.o.   MRN: [id number] [employer] 24 hour holter monitor applied to patient.

## 2015-01-23 NOTE — Progress Notes (Signed)
HPI The patient presents for followup of weakness and confusion for which she was hospitalized in July. He was noted to have premature ventricular contractions at that time. He was told his heart rate was low but as I see it as I review his notes this was because his ectopy was under counted.  His beta blocker dose was actually increased. He did have an echocardiogram which suggested that his ejection fraction was lower than previous at approximately 40%. He required no further cardiovascular testing. He has not had any chest pressure, neck or arm discomfort. He doesn't really notice the palpitations but he can see his pulse in his wrist and he knows that it is skipping. He does have fatigue. He does have some mild shortness of breath no PND or orthopnea. He has had no weight gain or edema.   Allergies  Allergen Reactions  . Lisinopril Cough    Renal artery stenosis by history; ACE inhibitor  would be relatively contraindicated  . Zithromax [Azithromycin]     02/15/14 N&V  . Niacin Other (See Comments)    Burning sensations in head    Current Outpatient Prescriptions  Medication Sig Dispense Refill  . aspirin 81 MG tablet Take 81 mg by mouth at bedtime.     Marland Kitchen CALCIUM-MAGNESIUM-ZINC PO Take 1 tablet by mouth daily.    . carvedilol (COREG) 12.5 MG tablet Take 1 tablet (12.5 mg total) by mouth 2 (two) times daily. 60 tablet 5  . cholecalciferol (VITAMIN D) 1000 UNITS tablet Take 2,000 Units by mouth daily.    . CRESTOR 40 MG tablet take 1/2 tablet by mouth once daily 15 tablet 5  . ezetimibe (ZETIA) 10 MG tablet Take 5 mg by mouth daily.    Marland Kitchen losartan (COZAAR) 25 MG tablet Take 1 tablet (25 mg total) by mouth daily. 30 tablet 0  . Melatonin 3 MG TBDP Take 1 tablet by mouth at bedtime as needed (sleep). For sleep.    Marland Kitchen NITROSTAT 0.4 MG SL tablet DISSOLVE 1 TAB UNDER TONGUE AS NEEDED FOR CHEST PAIN, MAY REPEAT IN 5 MINUTES AS DIRECTED. ER IF NO BETTER. 30 each 2  . Omega-3 Fatty Acids (FISH  OIL) 1000 MG CAPS Take 1 capsule by mouth daily.      . tamsulosin (FLOMAX) 0.4 MG CAPS capsule take 1 capsule by mouth daily after SUPPER 30 capsule 5   No current facility-administered medications for this visit.    Past Medical History  Diagnosis Date  . Myocardial infarction 2003    hx of   . Hyperlipidemia   . Anxiety   . Fatigue   . Insomnia   . Renal artery stenosis     bilateral;  s/p R RA stent 2011  . Hyponatremia 09/2012    Associated with postural hypotension and confusion  . Hypertension   . Coronary artery disease     a. s/p CABG;  b. LHC (2/11):  LM 30-40, pLAD 95-99 then 80 and 90, mCFX 90, pRCA occluded, S-dRCA ok, S-D1 ok, S-OM patent with 50, L-LAD ok.  Med Rx recommended.  c.  Myoview (5/13):  Low risk with small fixed inf defect c/w scar, no ischemia, EF 48%.  d.  Echo (6/13):  EF 55-60%, inf HK, Gr 1 DD, MAC;  e. Lexiscan Myoview (10/14):  Low risk, EF 38%, inferobasal infarct, no ischemia.  Marland Kitchen Hx of echocardiogram     Echo (10/14):  EF 55-60%, Gr 1DD, MAC, mild MR, mild LAE.  Past Surgical History  Procedure Laterality Date  . Cardiac catheterization  2003    revdeling severe three-vessel disease  . Coronary artery bypass graft      LIMA to LAD, SVG to diagonal, SVG to RCA, and SVG to circumflex  . Polypectomy      colon  . Renal artery stent  01-28-10    ROS:  Tinitis, leg weakness.  Otherwise as stated in the HPI and negative for all other systems.  PHYSICAL EXAM BP 100/82 mmHg  Pulse 50  Ht 5\' 4"  (1.626 m)  Wt 148 lb 1.6 oz (67.178 kg)  BMI 25.41 kg/m2 GENERAL:  Well appearing HEENT:  Pupils equal round and reactive, fundi not visualized, oral mucosa unremarkable NECK:  No jugular venous distention, waveform within normal limits, carotid upstroke brisk and symmetric, no bruits, no thyromegaly LYMPHATICS:  No cervical, inguinal adenopathy LUNGS:  Clear to auscultation bilaterally BACK:  No CVA tenderness CHEST:  Well healed sternotomy  scar. HEART:  PMI not displaced or sustained,S1 and S2 within normal limits, no S3, no S4, no clicks, no rubs, no murmurs, ectopy ABD:  Flat, positive bowel sounds normal in frequency in pitch, no bruits, no rebound, no guarding, no midline pulsatile mass, no hepatomegaly, no splenomegaly EXT:  2 plus pulses throughout, right lower extremity edema and calf tenderness without cord or mass, no cyanosis no clubbing SKIN:  No rashes no nodules NEURO:  Cranial nerves II through XII grossly intact, motor grossly intact throughout PSYCH:  Cognitively intact, oriented to person place and time    ASSESSMENT AND PLAN  CAD:  He will continue with risk reduction. He is not having any chest pain. At this point I don't think he has any active ischemia.  PVC: I will follow-up with a Holter monitor. At this point he's not having any overt symptoms. However, he might need further adjustment of his medications   HTN:  The blood pressure is at target. No change in medications is indicated. We will continue with therapeutic lifestyle changes (TLC).  CARDIOMYOPATHY:  His ejection fraction is mildly reduced on the recent echo. I will follow this up with an echo in July.

## 2015-01-23 NOTE — Patient Instructions (Signed)
Your physician recommends that you schedule a follow-up appointment in: one month with Dr. Percival Spanish  We are ordering a 24 hr monitor

## 2015-02-05 ENCOUNTER — Ambulatory Visit: Payer: Medicare HMO | Admitting: Internal Medicine

## 2015-02-10 ENCOUNTER — Other Ambulatory Visit: Payer: Self-pay

## 2015-02-10 MED ORDER — LOSARTAN POTASSIUM 25 MG PO TABS: 25.0000 mg | ORAL_TABLET | Freq: Every day | ORAL | Status: DC

## 2015-02-21 ENCOUNTER — Encounter: Payer: Self-pay | Admitting: Cardiology

## 2015-02-21 ENCOUNTER — Ambulatory Visit (INDEPENDENT_AMBULATORY_CARE_PROVIDER_SITE_OTHER): Payer: Commercial Managed Care - HMO | Admitting: Cardiology

## 2015-02-21 VITALS — BP 132/84 | HR 68 | Ht 64.0 in | Wt 147.0 lb

## 2015-02-21 DIAGNOSIS — R5382 Chronic fatigue, unspecified: Secondary | ICD-10-CM

## 2015-02-21 MED ORDER — CARVEDILOL 6.25 MG PO TABS
6.2500 mg | ORAL_TABLET | Freq: Two times a day (BID) | ORAL | Status: DC
Start: 2015-02-21 — End: 2015-10-29

## 2015-02-21 NOTE — Progress Notes (Signed)
HPI The patient presents for followup of weakness and confusion for which he was hospitalized in July. He was noted to have premature ventricular contractions at that time. He was told his heart rate was low but as I see it as I review his notes this was because his ectopy was under counted.  His beta blocker dose was actually increased. He did have an echocardiogram which suggested that his ejection fraction was lower than previous at approximately 40%. He required no further cardiovascular testing.   However, he is now returning for follow-up and he just much more fatigued. He has leg weakness. He doesn't feel like doing stop. He's not had any presyncope or syncope. He's not had any chest pressure, neck or arm discomfort. He's had no weight gain or edema. He denies any shortness of breath.  He did have a follow-up Holter which demonstrated some frequent ventricular ectopy. However, there were no sustained dysrhythmias and I did not change his medicines at that time.  Allergies  Allergen Reactions  . Lisinopril Cough    Renal artery stenosis by history; ACE inhibitor  would be relatively contraindicated  . Zithromax [Azithromycin]     02/15/14 N&V  . Niacin Other (See Comments)    Burning sensations in head    Current Outpatient Prescriptions  Medication Sig Dispense Refill  . aspirin 81 MG tablet Take 81 mg by mouth at bedtime.     Marland Kitchen CALCIUM-MAGNESIUM-ZINC PO Take 1 tablet by mouth daily.    . carvedilol (COREG) 12.5 MG tablet Take 1 tablet (12.5 mg total) by mouth 2 (two) times daily. 60 tablet 5  . cholecalciferol (VITAMIN D) 1000 UNITS tablet Take 2,000 Units by mouth daily.    . CRESTOR 40 MG tablet take 1/2 tablet by mouth once daily 15 tablet 5  . ezetimibe (ZETIA) 10 MG tablet Take 5 mg by mouth daily.    Marland Kitchen losartan (COZAAR) 25 MG tablet Take 1 tablet (25 mg total) by mouth daily. 30 tablet 5  . Melatonin 3 MG TBDP Take 1 tablet by mouth at bedtime as needed (sleep). For sleep.      Marland Kitchen NITROSTAT 0.4 MG SL tablet DISSOLVE 1 TAB UNDER TONGUE AS NEEDED FOR CHEST PAIN, MAY REPEAT IN 5 MINUTES AS DIRECTED. ER IF NO BETTER. 30 each 2  . Omega-3 Fatty Acids (FISH OIL) 1000 MG CAPS Take 1 capsule by mouth daily.      . tamsulosin (FLOMAX) 0.4 MG CAPS capsule take 1 capsule by mouth daily after SUPPER 30 capsule 5   No current facility-administered medications for this visit.    Past Medical History  Diagnosis Date  . Myocardial infarction 2003    hx of   . Hyperlipidemia   . Anxiety   . Fatigue   . Insomnia   . Renal artery stenosis     bilateral;  s/p R RA stent 2011  . Hyponatremia 09/2012    Associated with postural hypotension and confusion  . Hypertension   . Coronary artery disease     a. s/p CABG;  b. LHC (2/11):  LM 30-40, pLAD 95-99 then 80 and 90, mCFX 90, pRCA occluded, S-dRCA ok, S-D1 ok, S-OM patent with 50, L-LAD ok.  Med Rx recommended.  c.  Myoview (5/13):  Low risk with small fixed inf defect c/w scar, no ischemia, EF 48%.  d.  Echo (6/13):  EF 55-60%, inf HK, Gr 1 DD, MAC;  e. Lexiscan Myoview (10/14):  Low risk, EF  38%, inferobasal infarct, no ischemia.  Marland Kitchen Hx of echocardiogram     Echo (10/14):  EF 55-60%, Gr 1DD, MAC, mild MR, mild LAE.    Past Surgical History  Procedure Laterality Date  . Cardiac catheterization  2003    revdeling severe three-vessel disease  . Coronary artery bypass graft      LIMA to LAD, SVG to diagonal, SVG to RCA, and SVG to circumflex  . Polypectomy      colon  . Renal artery stent  01-28-10    ROS:  Leg weakness.  Otherwise as stated in the HPI and negative for all other systems.  PHYSICAL EXAM BP 132/84 mmHg  Pulse 68  Ht 5\' 4"  (1.626 m)  Wt 147 lb (66.679 kg)  BMI 25.22 kg/m2 GENERAL:  Well appearing HEENT:  Pupils equal round and reactive, fundi not visualized, oral mucosa unremarkable NECK:  No jugular venous distention, waveform within normal limits, carotid upstroke brisk and symmetric, no bruits, no  thyromegaly LYMPHATICS:  No cervical, inguinal adenopathy LUNGS:  Clear to auscultation bilaterally BACK:  No CVA tenderness CHEST:  Well healed sternotomy scar. HEART:  PMI not displaced or sustained,S1 and S2 within normal limits, no S3, no S4, no clicks, no rubs, no murmurs, ectopy ABD:  Flat, positive bowel sounds normal in frequency in pitch, no bruits, no rebound, no guarding, no midline pulsatile mass, no hepatomegaly, no splenomegaly EXT:  2 plus pulses throughout, right lower extremity edema and calf tenderness without cord or mass, no cyanosis no clubbing SKIN:  No rashes no nodules NEURO:  Cranial nerves II through XII grossly intact, motor grossly intact throughout PSYCH:  Cognitively intact, oriented to person place and tim    ASSESSMENT AND PLAN  CAD:  He will continue with risk reduction. He is not having any chest pain. At this point I don't think he has any active ischemia.  PVC: He is not really noticing these.  However, he's really fatigued on the increased dose of beta blocker. Therefore, I will reduce the to 6.25 mg twice daily. If he still very tired on this dose we could try him off the beta blocker completely or even consider putting the Crestor for a while to see if he improves.  HTN:  The blood pressure is at target. No change in medications is indicated. We will continue with therapeutic lifestyle changes (TLC).  I reviewed his blood pressure diary. This was reasonably well controlled.  CARDIOMYOPATHY:  His ejection fraction is mildly reduced on the recent echo. I will follow this up with an echo in July.

## 2015-02-21 NOTE — Patient Instructions (Signed)
Your physician recommends that you schedule a follow-up appointment in: 4 months with Dr. Percival Spanish  Decrease your carvedilol to 6.25 mg two times a day  You will be having an ECHO in july

## 2015-02-24 ENCOUNTER — Ambulatory Visit (INDEPENDENT_AMBULATORY_CARE_PROVIDER_SITE_OTHER): Payer: Commercial Managed Care - HMO | Admitting: Internal Medicine

## 2015-02-24 ENCOUNTER — Encounter: Payer: Self-pay | Admitting: Internal Medicine

## 2015-02-24 VITALS — BP 138/70 | HR 75 | Temp 97.9°F | Resp 12 | Ht 63.75 in | Wt 147.8 lb

## 2015-02-24 DIAGNOSIS — J310 Chronic rhinitis: Secondary | ICD-10-CM

## 2015-02-24 NOTE — Patient Instructions (Signed)

## 2015-02-24 NOTE — Progress Notes (Signed)
   Subjective:    Patient ID: Victor Waters, male    DOB: Apr 20, 1926, 81 y.o.   MRN: [id number]  HPI He's had a cough for 2 months; basically he has had postnasal drainage and clearing of his throat. He's had some paranasal pressure but no other signs of upper respiratory tract infection.  Basically his wife is concerned as they are flying to [facility] & she is worried about an URI being present. Unrelated is chronic tinnitus.   Review of Systems Frontal headache, facial pain , nasal purulence, dental pain, sore throat , otic pain or otic discharge denied. No fever , chills or sweats. Extrinsic symptoms of itchy, watery eyes, sneezing, or angioedema are denied. There is no significant  sputum production, wheezing,or  paroxysmal nocturnal dyspnea       Objective:   Physical Exam   Pertinent positive findings include: Minimal erythema of the nasal mucosa with slight septal deviation to the right. He has an S4 with occasional premature.  General appearance:Adequately nourished; no acute distress or increased work of breathing is present.  No  lymphadenopathy about the head, neck, or axilla noted.   Eyes: No conjunctival inflammation or lid edema is present. There is no scleral icterus.  Ears:  External ear exam shows no significant lesions or deformities.  Otoscopic examination reveals clear canals, tympanic membranes are intact bilaterally without bulging, retraction, inflammation or discharge.  Nose:  External nasal examination shows no deformity or inflammation. No obstruction to airflow.   Oral exam: Dental hygiene is good; lips and gums are healthy appearing.There is no oropharyngeal erythema or exudate noted.   Neck:  No deformities, thyromegaly, masses, or tenderness noted.   Supple with full range of motion without pain.   Heart:  Normal rate and regular rhythm. S1 and S2 normal without gallop, murmur, click, or rub .  Lungs:Chest clear to auscultation; no wheezes,  rhonchi,rales ,or rubs present.  Extremities:  No cyanosis, edema, or clubbing  noted    Skin: Warm & dry w/o jaundice or tenting.       Assessment & Plan:  #1 nonallergic rhinitis well-thought-out history or physical findings to suggest rhinosinusitis or acute bronchitis  Plan: See orders and recommendations

## 2015-02-24 NOTE — Progress Notes (Signed)
Pre visit review using our clinic review tool, if applicable. No additional management support is needed unless otherwise documented below in the visit note. 

## 2015-03-03 IMAGING — CT CT HEAD W/O CM
2 series · 15 of 30 positions shown, 19 images · non-contrast
Comparison: CT of the head performed 10/02/2012

CLINICAL DATA: Acute onset of weakness, dizziness and bradycardia.
Initial encounter.

EXAM:
CT HEAD WITHOUT CONTRAST
TECHNIQUE: Contiguous axial images were obtained from the base of the skull
through the vertex without intravenous contrast.

[Series 2: head w/o · axial · non-contrast · 0.45mm/px · z∈[-206,-66]mm · 13 of 34 slices shown, 17 images]
[im 3/34  brain]
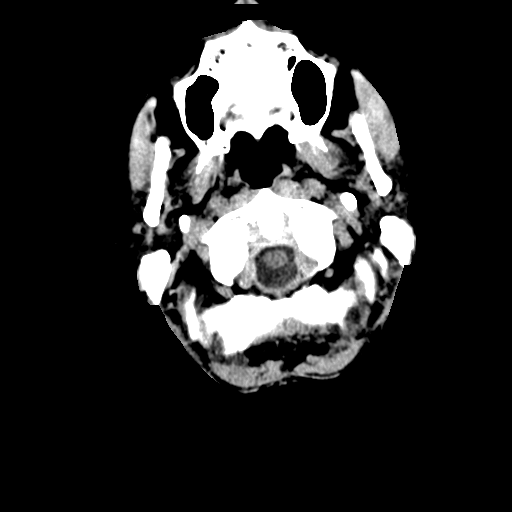
[im 3/34  bone]
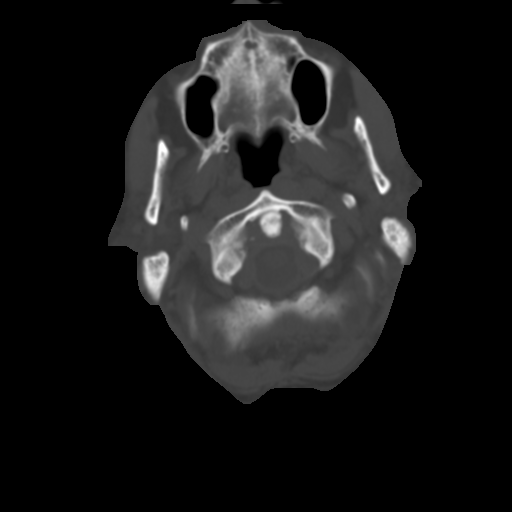
[im 5/34  brain]
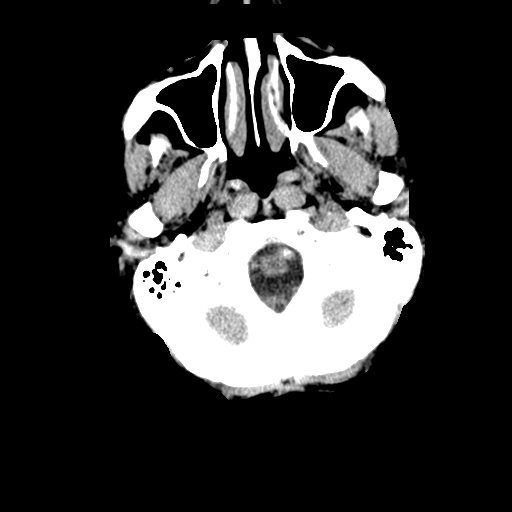
[im 8/34  brain]
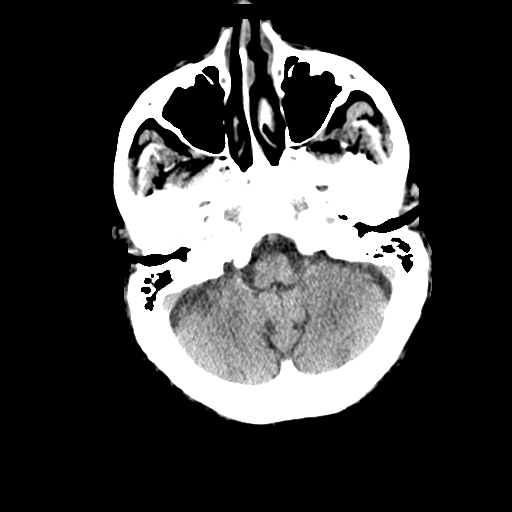
[im 10/34  brain]
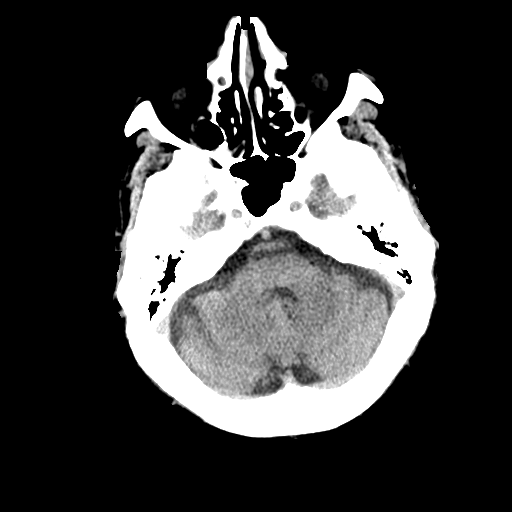
[im 12/34  brain]
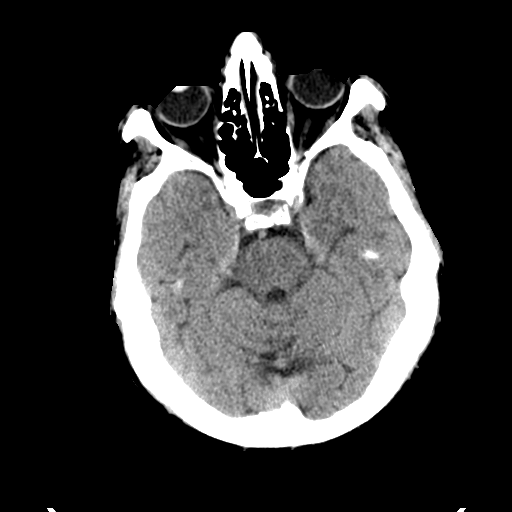
[im 12/34  bone]
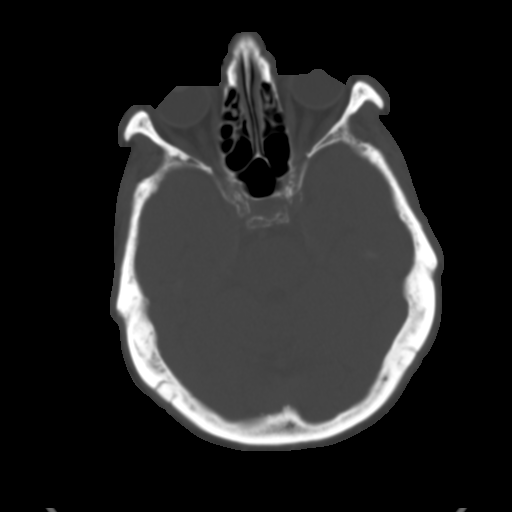
[im 15/34  brain]
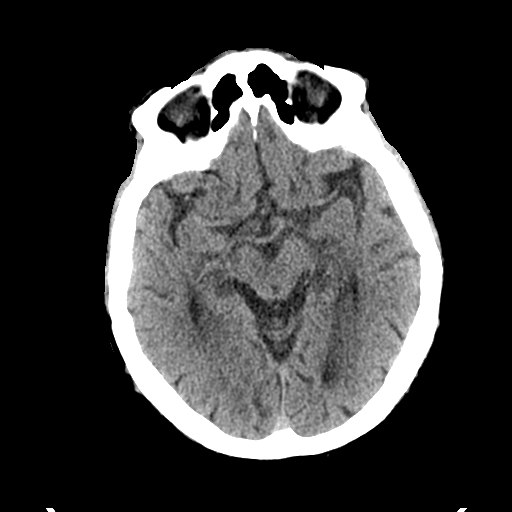
[im 17/34  brain]
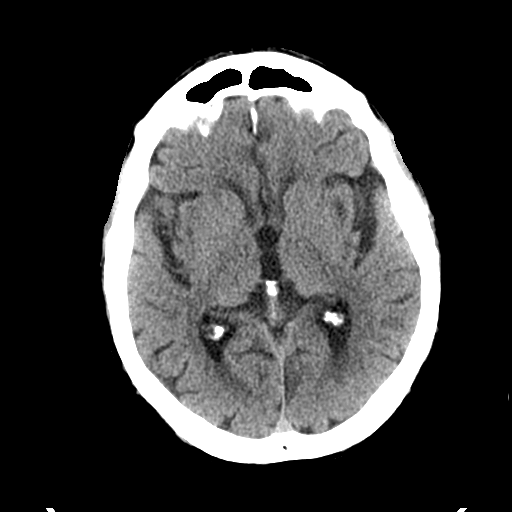
[im 19/34  brain]
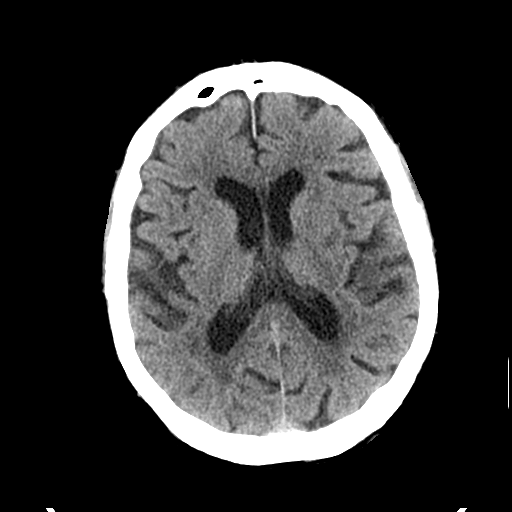
[im 22/34  brain]
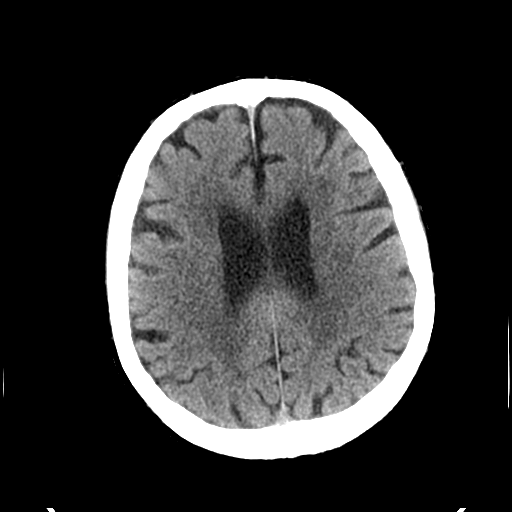
[im 22/34  bone]
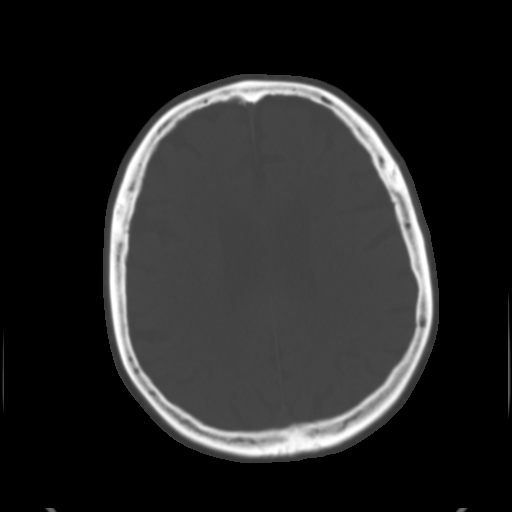
[im 24/34  brain]
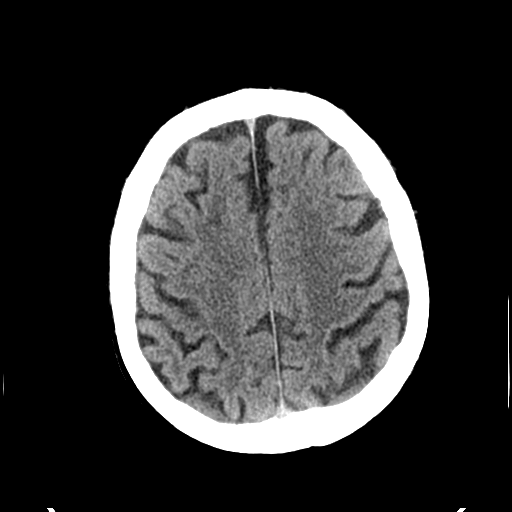
[im 26/34  brain]
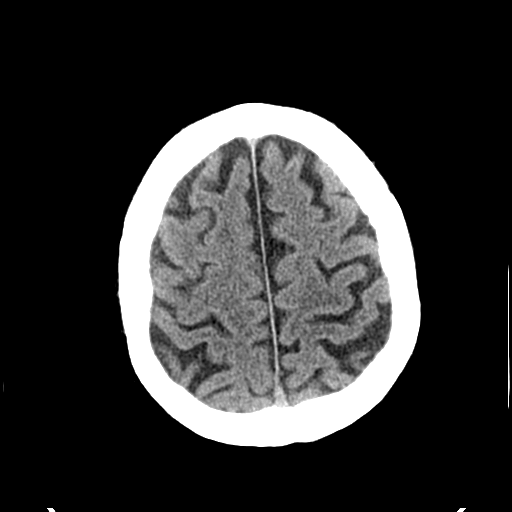
[im 29/34  brain]
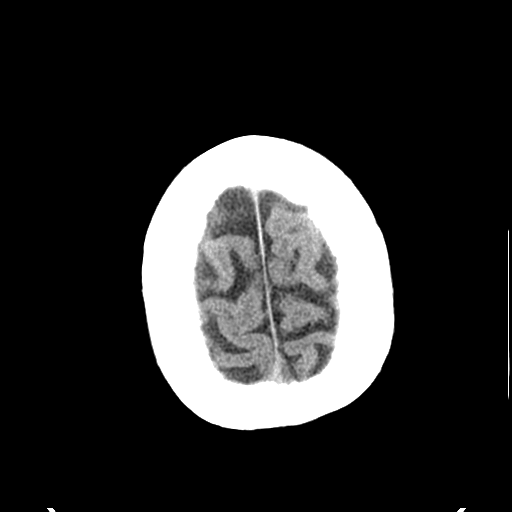
[im 31/34  brain]
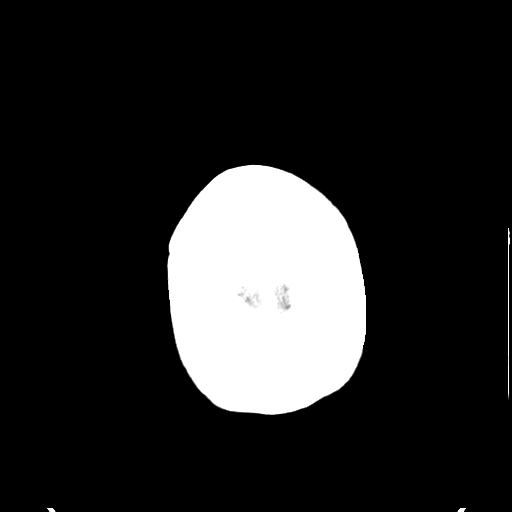
[im 31/34  bone]
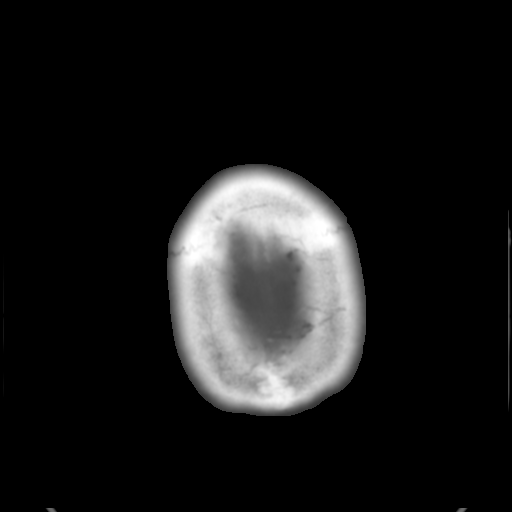

[Series 3: bone windows · axial · 0.45mm/px · z∈[-206,-181]mm · 2 of 34 slices shown]
[im 3/34  bone]
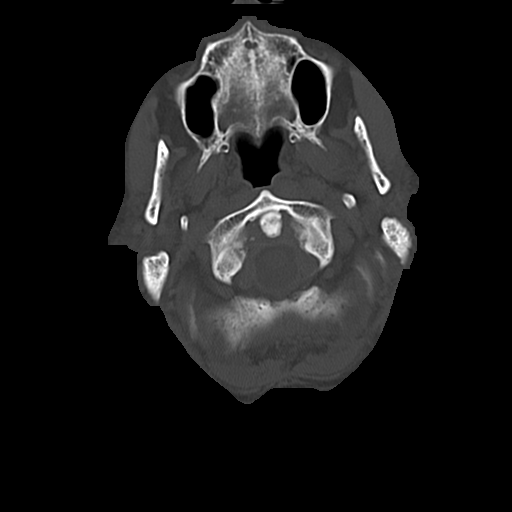
[im 8/34  bone]
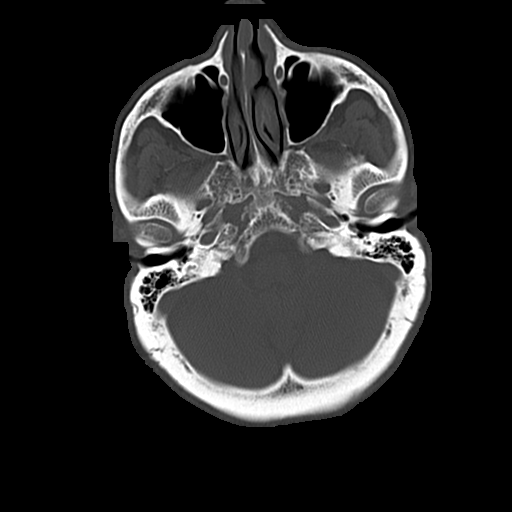

[15 of 30 positions shown; findings below may reference images not displayed]

FINDINGS: There is no evidence of acute infarction, mass lesion, or intra- or
extra-axial hemorrhage on CT.

Prominence of the ventricles and sulci reflects mild cortical volume
loss. Mild cerebellar atrophy is noted. Scattered periventricular
and subcortical white matter change likely reflects small vessel
ischemic microangiopathy. An apparent chronic lacunar infarct is
seen at the superior left basal ganglia.

The brainstem and fourth ventricle are within normal limits. The
cerebral hemispheres demonstrate grossly normal gray-white
differentiation. No mass effect or midline shift is seen.

There is no evidence of fracture; visualized osseous structures are
unremarkable in appearance. The orbits are within normal limits. The
paranasal sinuses and mastoid air cells are well-aerated. No
significant soft tissue abnormalities are seen.
IMPRESSION: 1. No acute intracranial pathology seen on CT.
2. Mild cortical volume loss and scattered small vessel ischemic
microangiopathy. Chronic lacunar infarct at the superior left basal
ganglia.

## 2015-03-21 ENCOUNTER — Other Ambulatory Visit: Payer: Self-pay

## 2015-03-21 MED ORDER — NITROGLYCERIN 0.4 MG SL SUBL: SUBLINGUAL_TABLET | SUBLINGUAL | Status: DC

## 2015-03-24 ENCOUNTER — Other Ambulatory Visit: Payer: Self-pay

## 2015-03-24 MED ORDER — TAMSULOSIN HCL 0.4 MG PO CAPS: ORAL_CAPSULE | ORAL | Status: DC

## 2015-04-21 ENCOUNTER — Other Ambulatory Visit: Payer: Self-pay | Admitting: Internal Medicine

## 2015-04-29 ENCOUNTER — Other Ambulatory Visit: Payer: Self-pay | Admitting: Internal Medicine

## 2015-06-02 ENCOUNTER — Telehealth: Payer: Self-pay | Admitting: Internal Medicine

## 2015-06-02 NOTE — Telephone Encounter (Signed)
He is an active patient of cardiologist; he just needs to call their office for an appointment

## 2015-06-02 NOTE — Telephone Encounter (Signed)
Please advise 

## 2015-06-02 NOTE — Telephone Encounter (Signed)
Spoke with pts spouse. Informed her to call cardiologist for appt and that pt did not need referral

## 2015-06-02 NOTE — Telephone Encounter (Signed)
Patient need a referral to see Dr Ardis Rowan for his heart trouble his appt is August 8th at 8:45

## 2015-07-28 ENCOUNTER — Ambulatory Visit (INDEPENDENT_AMBULATORY_CARE_PROVIDER_SITE_OTHER): Payer: Commercial Managed Care - HMO | Admitting: Cardiology

## 2015-07-28 ENCOUNTER — Encounter: Payer: Self-pay | Admitting: Cardiology

## 2015-07-28 VITALS — BP 122/54 | HR 42 | Ht 64.0 in | Wt 143.0 lb

## 2015-07-28 DIAGNOSIS — E782 Mixed hyperlipidemia: Secondary | ICD-10-CM

## 2015-07-28 DIAGNOSIS — M79641 Pain in right hand: Secondary | ICD-10-CM | POA: Diagnosis not present

## 2015-07-28 DIAGNOSIS — I429 Cardiomyopathy, unspecified: Secondary | ICD-10-CM | POA: Diagnosis not present

## 2015-07-28 NOTE — Patient Instructions (Signed)
Your physician wants you to follow-up in: 6 Months. You will receive a reminder letter in the mail two months in advance. If you don't receive a letter, please call our office to schedule the follow-up appointment.  Your physician has requested that you have an echocardiogram. Echocardiography is a painless test that uses sound waves to create images of your heart. It provides your doctor with information about the size and shape of your heart and how well your heart's chambers and valves are working. This procedure takes approximately one hour. There are no restrictions for this procedure.  Your physician recommends that you return for lab work Fasting Lipids  You have been referred to Dr [facility]

## 2015-07-28 NOTE — Progress Notes (Signed)
HPI The patient presents for followup of CAD.  Since I last saw him he has had no new cardiovascular complaints. He is very fatigued. He gets up frequently at night to urinate. He's not sleeping well. He does his activities of daily living. With this he denies any cardiovascular symptoms. The patient denies any new symptoms such as chest discomfort, neck or arm discomfort. There has been no new shortness of breath, PND or orthopnea. There have been no reported palpitations, presyncope or syncope.  Allergies  Allergen Reactions  . Lisinopril Cough    Renal artery stenosis by history; ACE inhibitor  would be relatively contraindicated  . Zithromax [Azithromycin]     02/15/14 N&V  . Niacin Other (See Comments)    Burning sensations in head    Current Outpatient Prescriptions  Medication Sig Dispense Refill  . aspirin 81 MG tablet Take 81 mg by mouth at bedtime.     Marland Kitchen CALCIUM-MAGNESIUM-ZINC PO Take 1 tablet by mouth daily.    . carvedilol (COREG) 6.25 MG tablet Take 1 tablet (6.25 mg total) by mouth 2 (two) times daily. 180 tablet 3  . cholecalciferol (VITAMIN D) 1000 UNITS tablet Take 2,000 Units by mouth daily.    . CRESTOR 40 MG tablet take 1/2 tablet by mouth once daily 15 tablet 5  . ezetimibe (ZETIA) 10 MG tablet Take 5 mg by mouth daily.    Marland Kitchen losartan (COZAAR) 25 MG tablet Take 1 tablet (25 mg total) by mouth daily. 30 tablet 5  . Melatonin 3 MG TBDP Take 1 tablet by mouth at bedtime as needed (sleep). For sleep.    . nitroGLYCERIN (NITROSTAT) 0.4 MG SL tablet DISSOLVE 1 TAB UNDER TONGUE AS NEEDED FOR CHEST PAIN, MAY REPEAT IN 5 MINUTES AS DIRECTED. ER IF NO BETTER. 30 tablet 0  . Omega-3 Fatty Acids (FISH OIL) 1000 MG CAPS Take 1 capsule by mouth daily.      . tamsulosin (FLOMAX) 0.4 MG CAPS capsule take 1 capsule by mouth daily after SUPPER 30 capsule 5   No current facility-administered medications for this visit.    Past Medical History  Diagnosis Date  . Myocardial  infarction 2003    hx of   . Hyperlipidemia   . Anxiety   . Fatigue   . Insomnia   . Renal artery stenosis     bilateral;  s/p R RA stent 2011  . Hyponatremia 09/2012    Associated with postural hypotension and confusion  . Hypertension   . Coronary artery disease     a. s/p CABG;  b. LHC (2/11):  LM 30-40, pLAD 95-99 then 80 and 90, mCFX 90, pRCA occluded, S-dRCA ok, S-D1 ok, S-OM patent with 50, L-LAD ok.  Med Rx recommended.  c.  Myoview (5/13):  Low risk with small fixed inf defect c/w scar, no ischemia, EF 48%.  d.  Echo (6/13):  EF 55-60%, inf HK, Gr 1 DD, MAC;  e. Lexiscan Myoview (10/14):  Low risk, EF 38%, inferobasal infarct, no ischemia.  Marland Kitchen Hx of echocardiogram     Echo (10/14):  EF 55-60%, Gr 1DD, MAC, mild MR, mild LAE.    Past Surgical History  Procedure Laterality Date  . Cardiac catheterization  2003    revdeling severe three-vessel disease  . Coronary artery bypass graft      LIMA to LAD, SVG to diagonal, SVG to RCA, and SVG to circumflex  . Polypectomy      colon  . Renal  artery stent  01-28-10    ROS:  Leg weakness, nocturia, decreased sleep, tiredness.  Otherwise as stated in the HPI and negative for all other systems.  PHYSICAL EXAM BP 122/54 mmHg  Pulse 42  Ht 5\' 4"  (1.626 m)  Wt 143 lb (64.864 kg)  BMI 24.53 kg/m2 GENERAL:  Well appearing HEENT:  Pupils equal round and reactive, fundi not visualized, oral mucosa unremarkable NECK:  No jugular venous distention, waveform within normal limits, carotid upstroke brisk and symmetric, no bruits, no thyromegaly LYMPHATICS:  No cervical, inguinal adenopathy LUNGS:  Clear to auscultation bilaterally BACK:  No CVA tenderness CHEST:  Well healed sternotomy scar. HEART:  PMI not displaced or sustained,S1 and S2 within normal limits, no S3, no S4, no clicks, no rubs, no murmurs, ectopy ABD:  Flat, positive bowel sounds normal in frequency in pitch, no bruits, no rebound, no guarding, no midline pulsatile mass, no  hepatomegaly, no splenomegaly EXT:  2 plus pulses throughout, right lower extremity edema and calf tenderness without cord or mass, no cyanosis no clubbing   ASSESSMENT AND PLAN  CAD:  He will continue with risk reduction. He is not having any chest pain. At this point I don't think he has any active ischemia.  PVC: He is not really noticing these.  No change in therapy is indicated.   HTN:  The blood pressure is at target. No change in medications is indicated. We will continue with therapeutic lifestyle changes (TLC).  CARDIOMYOPATHY:  His ejection fraction is mildly reduced on the recent echo. I will follow this up with an echo.   CONTRACTURES:  He wants to be referred to orthopedics for evaluation of this in his hand.  DYSLIPIDEMIA:  I will check a lipid profile. He may be able to come off Zetia and fish oil.

## 2015-07-30 LAB — LIPID PANEL
CHOL/HDL RATIO: 3.3 ratio (ref <=5.0)
Cholesterol: 131 mg/dL (ref 125–200)
HDL: 40 mg/dL (ref >=40)
LDL CALC: 76 mg/dL (ref <130)
TRIGLYCERIDES: 73 mg/dL (ref <150)
VLDL: 15 mg/dL (ref <30)

## 2015-08-05 ENCOUNTER — Encounter: Payer: Self-pay | Admitting: *Deleted

## 2015-08-05 ENCOUNTER — Ambulatory Visit (HOSPITAL_COMMUNITY): Payer: Commercial Managed Care - HMO | Attending: Cardiovascular Disease

## 2015-08-05 ENCOUNTER — Other Ambulatory Visit: Payer: Self-pay

## 2015-08-05 DIAGNOSIS — I253 Aneurysm of heart: Secondary | ICD-10-CM | POA: Insufficient documentation

## 2015-08-05 DIAGNOSIS — I10 Essential (primary) hypertension: Secondary | ICD-10-CM | POA: Insufficient documentation

## 2015-08-05 DIAGNOSIS — E785 Hyperlipidemia, unspecified: Secondary | ICD-10-CM | POA: Diagnosis not present

## 2015-08-05 DIAGNOSIS — I340 Nonrheumatic mitral (valve) insufficiency: Secondary | ICD-10-CM | POA: Insufficient documentation

## 2015-08-05 DIAGNOSIS — I517 Cardiomegaly: Secondary | ICD-10-CM | POA: Diagnosis not present

## 2015-08-05 DIAGNOSIS — I429 Cardiomyopathy, unspecified: Secondary | ICD-10-CM | POA: Diagnosis not present

## 2015-08-05 DIAGNOSIS — I350 Nonrheumatic aortic (valve) stenosis: Secondary | ICD-10-CM | POA: Insufficient documentation

## 2015-08-19 ENCOUNTER — Other Ambulatory Visit: Payer: Self-pay | Admitting: Emergency Medicine

## 2015-08-19 ENCOUNTER — Telehealth: Payer: Self-pay | Admitting: Internal Medicine

## 2015-08-19 MED ORDER — ROSUVASTATIN CALCIUM 40 MG PO TABS: 20.0000 mg | ORAL_TABLET | Freq: Every day | ORAL | Status: DC

## 2015-08-19 NOTE — Telephone Encounter (Signed)
Pharmacy called requesting a 90 day supply for CRESTOR 40 MG tablet [[id number]

## 2015-08-19 NOTE — Telephone Encounter (Signed)
Rx has been sent  

## 2015-09-01 ENCOUNTER — Encounter: Payer: Self-pay | Admitting: Cardiology

## 2015-09-01 ENCOUNTER — Ambulatory Visit (INDEPENDENT_AMBULATORY_CARE_PROVIDER_SITE_OTHER): Payer: Commercial Managed Care - HMO | Admitting: Cardiology

## 2015-09-01 VITALS — BP 132/62 | HR 62 | Ht 64.0 in | Wt 142.8 lb

## 2015-09-01 DIAGNOSIS — I10 Essential (primary) hypertension: Secondary | ICD-10-CM

## 2015-09-01 DIAGNOSIS — I2510 Atherosclerotic heart disease of native coronary artery without angina pectoris: Secondary | ICD-10-CM | POA: Diagnosis not present

## 2015-09-01 DIAGNOSIS — I255 Ischemic cardiomyopathy: Secondary | ICD-10-CM

## 2015-09-01 MED ORDER — LOSARTAN POTASSIUM 25 MG PO TABS: 25.0000 mg | ORAL_TABLET | Freq: Two times a day (BID) | ORAL | Status: DC

## 2015-09-01 NOTE — Patient Instructions (Signed)
Your physician has recommended you make the following change in your medication: 1) INCREASE Cozaar to 25 mg TWICE daily  Your physician has requested that you have a lexiscan myoview. For further information please visit [email or web address]. Please follow instruction sheet, as given.   Your physician recommends that you schedule a follow-up appointment in: [place] DR. HOCHREIN

## 2015-09-01 NOTE — Progress Notes (Signed)
HPI The patient presents for followup of CAD.  Since I last saw him he has had no new cardiovascular complaints. He has been fatigued.  I sent him for an echocardiogram to follow-up his reduced ejection fraction and his EF was said to be 25-30%. There have been over 35% previously. This has been low since his January hospitalization. I brought him back today to talk about his falling ejection fraction. He still doing his activities of daily living. He is vacuuming and walking the dog. He can do some yardwork. He might get some shortness of breath is not describing severe shortness of breath, PND or orthopnea. He's not having any palpitations, presyncope or syncope. He's not having any chest neck or arm discomfort.  Allergies  Allergen Reactions  . Lisinopril Cough    Renal artery stenosis by history; ACE inhibitor  would be relatively contraindicated  . Zithromax [Azithromycin]     02/15/14 N&V  . Niacin Other (See Comments)    Burning sensations in head    Current Outpatient Prescriptions  Medication Sig Dispense Refill  . aspirin 81 MG tablet Take 81 mg by mouth at bedtime.     Marland Kitchen CALCIUM-MAGNESIUM-ZINC PO Take 1 tablet by mouth daily.    . carvedilol (COREG) 6.25 MG tablet Take 1 tablet (6.25 mg total) by mouth 2 (two) times daily. 180 tablet 3  . cholecalciferol (VITAMIN D) 1000 UNITS tablet Take 2,000 Units by mouth daily.    Marland Kitchen ezetimibe (ZETIA) 10 MG tablet Take 5 mg by mouth daily.    Marland Kitchen losartan (COZAAR) 25 MG tablet Take 1 tablet (25 mg total) by mouth daily. 30 tablet 5  . Melatonin 3 MG TBDP Take 1 tablet by mouth at bedtime as needed (sleep). For sleep.    . nitroGLYCERIN (NITROSTAT) 0.4 MG SL tablet DISSOLVE 1 TAB UNDER TONGUE AS NEEDED FOR CHEST PAIN, MAY REPEAT IN 5 MINUTES AS DIRECTED. ER IF NO BETTER. 30 tablet 0  . Omega-3 Fatty Acids (FISH OIL) 1000 MG CAPS Take 1 capsule by mouth daily.      . rosuvastatin (CRESTOR) 40 MG tablet Take 0.5 tablets (20 mg total) by mouth  daily. 45 tablet 1  . tamsulosin (FLOMAX) 0.4 MG CAPS capsule take 1 capsule by mouth daily after SUPPER 30 capsule 5   No current facility-administered medications for this visit.    Past Medical History  Diagnosis Date  . Myocardial infarction 2003    hx of   . Hyperlipidemia   . Anxiety   . Fatigue   . Insomnia   . Renal artery stenosis     bilateral;  s/p R RA stent 2011  . Hyponatremia 09/2012    Associated with postural hypotension and confusion  . Hypertension   . Coronary artery disease     a. s/p CABG;  b. LHC (2/11):  LM 30-40, pLAD 95-99 then 80 and 90, mCFX 90, pRCA occluded, S-dRCA ok, S-D1 ok, S-OM patent with 50, L-LAD ok.  Med Rx recommended.  c.  Myoview (5/13):  Low risk with small fixed inf defect c/w scar, no ischemia, EF 48%.  d.  Echo (6/13):  EF 55-60%, inf HK, Gr 1 DD, MAC;  e. Lexiscan Myoview (10/14):  Low risk, EF 38%, inferobasal infarct, no ischemia.  Marland Kitchen Hx of echocardiogram     Echo (10/14):  EF 55-60%, Gr 1DD, MAC, mild MR, mild LAE.    Past Surgical History  Procedure Laterality Date  . Cardiac catheterization  2003    revdeling severe three-vessel disease  . Coronary artery bypass graft      LIMA to LAD, SVG to diagonal, SVG to RCA, and SVG to circumflex  . Polypectomy      colon  . Renal artery stent  01-28-10    ROS:  Nocuturia and tiredness.  Otherwise as stated in the HPI and negative for all other systems.  PHYSICAL EXAM BP 132/62 mmHg  Pulse 62  Ht 5\' 4"  (1.626 m)  Wt 142 lb 12.8 oz (64.774 kg)  BMI 24.50 kg/m2 GENERAL:  Well appearing HEENT:  Pupils equal round and reactive, fundi not visualized, oral mucosa unremarkable NECK:  No jugular venous distention, waveform within normal limits, carotid upstroke brisk and symmetric, no bruits, no thyromegaly LYMPHATICS:  No cervical, inguinal adenopathy LUNGS:  Clear to auscultation bilaterally BACK:  No CVA tenderness CHEST:  Well healed sternotomy scar. HEART:  PMI not displaced or  sustained,S1 and S2 within normal limits, no S3, no S4, no clicks, no rubs, no murmurs, ectopy ABD:  Flat, positive bowel sounds normal in frequency in pitch, no bruits, no rebound, no guarding, no midline pulsatile mass, no hepatomegaly, no splenomegaly EXT:  2 plus pulses throughout, no edema, no cyanosis no clubbing   ASSESSMENT AND PLAN  CAD:  His ejection fraction is lower than previous. He would prefer conservative therapy. I will screen him with a Lexiscan Myoview.  I do know that this will abnormal. I will be looking for high risk findings and otherwise managing him cervically.  PVC: He is not really noticing these.  No change in therapy is indicated.   HTN:  The blood pressure is at target. No change in medications is indicated. We will continue with therapeutic lifestyle changes (TLC).  CARDIOMYOPATHY:  His ejection fraction is lower.   I will begin to titrate his medicines. His heart rate is low so I will go up on his beta blocker. I will increase his Cozaar to 25 mg twice daily. Marland Kitchen  CONTRACTURES:  He wants to be referred to orthopedics for evaluation of this in his hand.  DYSLIPIDEMIA:  I will check a lipid profile. He may be able to come off Zetia and fish oil.

## 2015-09-04 ENCOUNTER — Telehealth (HOSPITAL_COMMUNITY): Payer: Self-pay

## 2015-09-04 NOTE — Telephone Encounter (Signed)
Encounter complete. 

## 2015-09-09 ENCOUNTER — Ambulatory Visit (HOSPITAL_COMMUNITY)
Admission: RE | Admit: 2015-09-09 | Discharge: 2015-09-09 | Disposition: A | Discharge status: Home / Self Care | Payer: Commercial Managed Care - HMO | Source: Ambulatory Visit | Attending: Cardiovascular Disease | Admitting: Cardiovascular Disease

## 2015-09-09 DIAGNOSIS — I2510 Atherosclerotic heart disease of native coronary artery without angina pectoris: Secondary | ICD-10-CM | POA: Insufficient documentation

## 2015-09-09 DIAGNOSIS — R079 Chest pain, unspecified: Secondary | ICD-10-CM | POA: Diagnosis not present

## 2015-09-09 DIAGNOSIS — R002 Palpitations: Secondary | ICD-10-CM | POA: Diagnosis not present

## 2015-09-09 DIAGNOSIS — R5383 Other fatigue: Secondary | ICD-10-CM | POA: Diagnosis not present

## 2015-09-09 DIAGNOSIS — I255 Ischemic cardiomyopathy: Secondary | ICD-10-CM

## 2015-09-09 DIAGNOSIS — R0602 Shortness of breath: Secondary | ICD-10-CM | POA: Diagnosis not present

## 2015-09-09 DIAGNOSIS — I10 Essential (primary) hypertension: Secondary | ICD-10-CM | POA: Insufficient documentation

## 2015-09-09 DIAGNOSIS — R0609 Other forms of dyspnea: Secondary | ICD-10-CM | POA: Diagnosis not present

## 2015-09-09 LAB — MYOCARDIAL PERFUSION IMAGING
CHL CUP NUCLEAR SDS: 0
CHL CUP NUCLEAR SRS: 0
CHL CUP RESTING HR STRESS: 83 {beats}/min
CHL CUP STRESS STAGE 3 HR: 95 {beats}/min
CHL CUP STRESS STAGE 4 HR: 95 {beats}/min
CSEPPHR: 95 {beats}/min
Estimated workload: 1 METS
NUC STRESS TID: 1.06
Peak BP: 147 mmHg
Percent of predicted max HR: 71 %
SSS: 0
Stage 1 DBP: 79 mmHg
Stage 1 Grade: 0 %
Stage 1 HR: 82 {beats}/min
Stage 1 SBP: 156 mmHg
Stage 1 Speed: 0 mph
Stage 2 Grade: 0 %
Stage 2 HR: 80 {beats}/min
Stage 2 Speed: 0 mph
Stage 3 DBP: 84 mmHg
Stage 3 Grade: 0 %
Stage 3 SBP: 147 mmHg
Stage 3 Speed: 0 mph
Stage 4 Grade: 0 %
Stage 4 Speed: 0 mph

## 2015-09-09 MED ORDER — TECHNETIUM TC 99M SESTAMIBI GENERIC - CARDIOLITE
32.7000 | Freq: Once | INTRAVENOUS | Status: AC | PRN
  Administered 2015-09-09: 32.7 via INTRAVENOUS

## 2015-09-09 MED ORDER — REGADENOSON 0.4 MG/5ML IV SOLN
0.4000 mg | Freq: Once | INTRAVENOUS | Status: AC
  Administered 2015-09-09: 0.4 mg via INTRAVENOUS

## 2015-09-09 MED ORDER — TECHNETIUM TC 99M SESTAMIBI GENERIC - CARDIOLITE
10.8000 | Freq: Once | INTRAVENOUS | Status: AC | PRN
  Administered 2015-09-09: 10.8 via INTRAVENOUS

## 2015-09-16 ENCOUNTER — Telehealth: Payer: Self-pay | Admitting: Cardiology

## 2015-09-16 NOTE — Telephone Encounter (Addendum)
No answer when dialed. No voice mail pickup.

## 2015-09-16 NOTE — Telephone Encounter (Signed)
Pt was calling in about his results from his stress test that was done on 9/20, he asked that the results be mailed to his house. Please call  Thanks

## 2015-09-18 ENCOUNTER — Encounter: Payer: Self-pay | Admitting: *Deleted

## 2015-09-18 NOTE — Telephone Encounter (Signed)
mailed results in letter to patient as requested

## 2015-10-03 ENCOUNTER — Encounter: Payer: Self-pay | Admitting: *Deleted

## 2015-10-07 ENCOUNTER — Ambulatory Visit (INDEPENDENT_AMBULATORY_CARE_PROVIDER_SITE_OTHER): Payer: Commercial Managed Care - HMO | Admitting: Cardiology

## 2015-10-07 ENCOUNTER — Encounter: Payer: Self-pay | Admitting: Cardiology

## 2015-10-07 VITALS — BP 158/73 | HR 71 | Ht 64.0 in | Wt 144.0 lb

## 2015-10-07 DIAGNOSIS — I429 Cardiomyopathy, unspecified: Secondary | ICD-10-CM

## 2015-10-07 NOTE — Patient Instructions (Signed)
Your physician wants you to follow-up in: 4 Months. You will receive a reminder letter in the mail two months in advance. If you don't receive a letter, please call our office to schedule the follow-up appointment.  Your physician has recommended you make the following change in your medication: Take Losartan 25 mg twice a day

## 2015-10-07 NOTE — Progress Notes (Signed)
HPI The patient presents for followup of CAD. A recent echo demonstrated a reduced ejection fraction and his EF was said to be 25-30%. There have been over 35% previously. I sent him for a stress perfusion study which demonstrated no evidence of ischemia.  He returns today for follow-up and med titration. He was supposed to be taking Cozaar twice a day but he actually didn't do this. His wife says he's getting forgetful. He still working in the yard get some dyspnea but is not having any PND or orthopnea. He's not having any chest pressure, neck or arm discomfort. He's had no palpitations, presyncope or syncope. He's had no weight gain or edema.  He is "slowing down."  Allergies  Allergen Reactions  . Lisinopril Cough    Renal artery stenosis by history; ACE inhibitor  would be relatively contraindicated  . Zithromax [Azithromycin]     02/15/14 N&V  . Niacin Other (See Comments)    Burning sensations in head    Current Outpatient Prescriptions  Medication Sig Dispense Refill  . aspirin 81 MG tablet Take 81 mg by mouth at bedtime.     Marland Kitchen CALCIUM-MAGNESIUM-ZINC PO Take 1 tablet by mouth daily.    . carvedilol (COREG) 6.25 MG tablet Take 1 tablet (6.25 mg total) by mouth 2 (two) times daily. 180 tablet 3  . cholecalciferol (VITAMIN D) 1000 UNITS tablet Take 2,000 Units by mouth daily.    Marland Kitchen ezetimibe (ZETIA) 10 MG tablet Take 5 mg by mouth daily.    Marland Kitchen losartan (COZAAR) 25 MG tablet Take 1 tablet (25 mg total) by mouth 2 (two) times daily. (Patient taking differently: Take 25 mg by mouth daily. ) 60 tablet 5  . Melatonin 3 MG TBDP Take 1 tablet by mouth at bedtime as needed (sleep). For sleep.    . nitroGLYCERIN (NITROSTAT) 0.4 MG SL tablet DISSOLVE 1 TAB UNDER TONGUE AS NEEDED FOR CHEST PAIN, MAY REPEAT IN 5 MINUTES AS DIRECTED. ER IF NO BETTER. 30 tablet 0  . Omega-3 Fatty Acids (FISH OIL) 1000 MG CAPS Take 1 capsule by mouth daily.      . rosuvastatin (CRESTOR) 40 MG tablet Take 0.5 tablets  (20 mg total) by mouth daily. 45 tablet 1  . tamsulosin (FLOMAX) 0.4 MG CAPS capsule take 1 capsule by mouth daily after SUPPER 30 capsule 5   No current facility-administered medications for this visit.    Past Medical History  Diagnosis Date  . Myocardial infarction ([facility]) 2003    hx of   . Hyperlipidemia   . Anxiety   . Fatigue   . Insomnia   . Renal artery stenosis ([facility])     bilateral;  s/p R RA stent 2011  . Hyponatremia 09/2012    Associated with postural hypotension and confusion  . Hypertension   . Coronary artery disease     a. s/p CABG;  b. LHC (2/11):  LM 30-40, pLAD 95-99 then 80 and 90, mCFX 90, pRCA occluded, S-dRCA ok, S-D1 ok, S-OM patent with 50, L-LAD ok.  Med Rx recommended.  c.  Myoview (5/13):  Low risk with small fixed inf defect c/w scar, no ischemia, EF 48%.  d.  Echo (6/13):  EF 55-60%, inf HK, Gr 1 DD, MAC;  e. Lexiscan Myoview (10/14):  Low risk, EF 38%, inferobasal infarct, no ischemia.  Marland Kitchen Hx of echocardiogram     Echo (10/14):  EF 55-60%, Gr 1DD, MAC, mild MR, mild LAE.    Past Surgical  History  Procedure Laterality Date  . Cardiac catheterization  2003    revdeling severe three-vessel disease  . Coronary artery bypass graft      LIMA to LAD, SVG to diagonal, SVG to RCA, and SVG to circumflex  . Polypectomy      colon  . Renal artery stent  01-28-10    ROS:  Nocuturia and tiredness.  Otherwise as stated in the HPI and negative for all other systems.  PHYSICAL EXAM BP 158/73 mmHg  Pulse 71  Ht 5\' 4"  (1.626 m)  Wt 144 lb (65.318 kg)  BMI 24.71 kg/m2 GENERAL:  Well appearing NECK:  No jugular venous distention, waveform within normal limits, carotid upstroke brisk and symmetric, no bruits, no thyromegaly LYMPHATICS:  No cervical, inguinal adenopathy LUNGS:  Clear to auscultation bilaterally CHEST:  Well healed sternotomy scar. HEART:  PMI not displaced or sustained,S1 and S2 within normal limits, no S3, no S4, no clicks, no rubs, no murmurs,  ectopy ABD:  Flat, positive bowel sounds normal in frequency in pitch, no bruits, no rebound, no guarding, no midline pulsatile mass, no hepatomegaly, no splenomegaly EXT:  2 plus pulses throughout, no edema, no cyanosis no clubbing, contractures right hand   ASSESSMENT AND PLAN  CAD:  He has no evidence of ischemia on his perfusion study. Manage this medically.  PVC: He is not really noticing these.  No change in therapy is indicated.   HTN:  This is being managed in the context of treating his CHF  CARDIOMYOPATHY:  I want to continue to titrate his medications. Today I clarified that his Cozaar should be twice a day. I would like to see him monthly to titrate his medications but this would be a significant expense. Therefore, they've been instructed to call me in one month and let me know how he is doing at which point I might over the phone be able to adjust his medications. I will need to know his blood pressure at that time.  CONTRACTURES:  Given the orthopedist. However, he wants to hold off on this for now.    DYSLIPIDEMIA:   His lipids were excellent.  I suggested that he continue the same meds.

## 2015-10-16 ENCOUNTER — Encounter: Payer: Self-pay | Admitting: Internal Medicine

## 2015-10-16 ENCOUNTER — Ambulatory Visit (INDEPENDENT_AMBULATORY_CARE_PROVIDER_SITE_OTHER): Payer: Commercial Managed Care - HMO | Admitting: Internal Medicine

## 2015-10-16 ENCOUNTER — Other Ambulatory Visit (INDEPENDENT_AMBULATORY_CARE_PROVIDER_SITE_OTHER): Payer: Commercial Managed Care - HMO

## 2015-10-16 VITALS — BP 142/80 | HR 48 | Temp 98.4°F | Resp 16 | Wt 145.0 lb

## 2015-10-16 DIAGNOSIS — R197 Diarrhea, unspecified: Secondary | ICD-10-CM

## 2015-10-16 DIAGNOSIS — Z23 Encounter for immunization: Secondary | ICD-10-CM

## 2015-10-16 DIAGNOSIS — E119 Type 2 diabetes mellitus without complications: Secondary | ICD-10-CM

## 2015-10-16 LAB — HEMOGLOBIN A1C: HEMOGLOBIN A1C: 6.3 % (ref 4.6–6.5)

## 2015-10-16 LAB — CBC WITH DIFFERENTIAL/PLATELET
BASOS PCT: 0.4 % (ref 0.0–3.0)
Basophils Absolute: 0 10*3/uL (ref 0.0–0.1)
EOS ABS: 0.1 10*3/uL (ref 0.0–0.7)
EOS PCT: 2.6 % (ref 0.0–5.0)
HCT: 44 % (ref 39.0–52.0)
HEMOGLOBIN: 14.4 g/dL (ref 13.0–17.0)
LYMPHS ABS: 0.8 10*3/uL (ref 0.7–4.0)
Lymphocytes Relative: 14.2 % (ref 12.0–46.0)
MCHC: 32.8 g/dL (ref 30.0–36.0)
MCV: 91 fl (ref 78.0–100.0)
MONO ABS: 0.8 10*3/uL (ref 0.1–1.0)
Monocytes Relative: 14.9 % — ABNORMAL HIGH (ref 3.0–12.0)
NEUTROS ABS: 3.8 10*3/uL (ref 1.4–7.7)
Neutrophils Relative %: 67.9 % (ref 43.0–77.0)
PLATELETS: 133 10*3/uL — AB (ref 150.0–400.0)
RBC: 4.83 Mil/uL (ref 4.22–5.81)
RDW: 14.8 % (ref 11.5–15.5)
WBC: 5.6 10*3/uL (ref 4.0–10.5)

## 2015-10-16 NOTE — Progress Notes (Signed)
Pre visit review using our clinic review tool, if applicable. No additional management support is needed unless otherwise documented below in the visit note. 

## 2015-10-16 NOTE — Progress Notes (Signed)
   Subjective:    Patient ID: Victor Waters, male    DOB: 1926/04/10, 81 y.o.   MRN: [id number]  HPI He describes "diarrhea for 3-4 days". Actually he is describing loose, not watery stools. These occur mainly in the morning. He feels these may have been exacerbated by eating shredded wheat. He is also on an ARB but denies any extrinsic symptoms.  He's had hair loss which he attributed to his prior shampoo. This is better using baby shampoo.  Initially he stated he may have had some diplopia but cannot verify this. He has some pain in his eyes at times.  He has occasional pill dysphagia.  He denies any other cardiac, pulmonary, GI, GU symptoms.  Review of the chart reveals a labs are not current. He did have an A1c of 6.6% in January of this year. Renal function at that time revealed mildly reduced GFR but normal creatinine BUN.  Review of Systems Extrinsic symptoms of itchy, watery eyes, sneezing, or angioedema are denied.   There is no significant cough, sputum production,hemoptysis, wheezing,or  paroxysmal nocturnal dyspnea. Unexplained weight loss, abdominal pain, significant dyspepsia,  melena, rectal bleeding, or persistently small caliber stools are not present. Dysuria, pyuria, hematuria, frequency, nocturia or polyuria are denied.    Objective:   Physical Exam Pertinent or positive findings include: Some pattern alopecia is present. He has a mustache. Heart rate is slow and irregular. Dupuytren's contractures are noted in the right hand. He has flexion of the fifth right finger. Slight tenting of the skin is noted. Gait is slightly broad and unstable.  General appearance :adequately nourished; in no distress.  Eyes: No conjunctival inflammation or scleral icterus is present.  Oral exam:  Lips and gums are healthy appearing.There is no oropharyngeal erythema or exudate noted. Dental hygiene is good.  Heart:  Normal rate and regular rhythm. S1 and S2 normal without gallop,  murmur, click, rub or other extra sounds    Lungs:Chest clear to auscultation; no wheezes, rhonchi,rales ,or rubs present.No increased work of breathing.   Abdomen: bowel sounds normal, soft and non-tender without masses, organomegaly or hernias noted.  No guarding or rebound.   Vascular : all pulses equal ; no bruits present.  Skin:Warm & dry.  Intact without suspicious lesions or rashes ; no  jaundice   Lymphatic: No lymphadenopathy is noted about the head, neck, axilla.   Neuro: Strength, tone decreased.     Assessment & Plan:  #1 loose stool; it's possible this might be related to the shredded wheat and gluten intolerance but not likely. A probiotic will be recommended.  #2 diabetes, questionable status  See orders and after visit summary

## 2015-10-16 NOTE — Patient Instructions (Signed)
  Your next office appointment will be determined based upon review of your pending labs.  Those written interpretation of the lab results and instructions will be transmitted to you by My Chart  Critical results will be called.   Followup as needed for any active or acute issue. Please report any significant change in your symptoms.  Please take a probiotic , Florastor OR Align, every day if the bowels are loose. This will replace the normal bacteria which  are necessary for formation of normal stool and processing of food.

## 2015-10-17 LAB — BASIC METABOLIC PANEL
BUN: 26 mg/dL — AB (ref 6–23)
CALCIUM: 9.8 mg/dL (ref 8.4–10.5)
CHLORIDE: 101 meq/L (ref 96–112)
CO2: 25 meq/L (ref 19–32)
CREATININE: 1.01 mg/dL (ref 0.40–1.50)
GFR: 74.16 mL/min (ref >60.00)
GLUCOSE: 97 mg/dL (ref 70–99)
Potassium: 4.6 mEq/L (ref 3.5–5.1)
Sodium: 136 mEq/L (ref 135–145)

## 2015-10-17 LAB — TSH: TSH: 1.94 u[IU]/mL (ref 0.35–4.50)

## 2015-10-29 ENCOUNTER — Other Ambulatory Visit: Payer: Self-pay

## 2015-10-29 MED ORDER — CARVEDILOL 6.25 MG PO TABS: 6.2500 mg | ORAL_TABLET | Freq: Two times a day (BID) | ORAL | Status: DC

## 2015-10-29 NOTE — Telephone Encounter (Signed)
Minus Breeding, MD at 10/07/2015 3:38 PM  carvedilol (COREG) 6.25 MG tablet Take 1 tablet (6.25 mg total) by mouth 2 (two) times daily

## 2015-10-30 ENCOUNTER — Other Ambulatory Visit: Payer: Self-pay | Admitting: Emergency Medicine

## 2015-10-30 DIAGNOSIS — I2510 Atherosclerotic heart disease of native coronary artery without angina pectoris: Secondary | ICD-10-CM

## 2015-10-30 DIAGNOSIS — I10 Essential (primary) hypertension: Secondary | ICD-10-CM

## 2015-10-30 MED ORDER — EZETIMIBE 10 MG PO TABS: 5.0000 mg | ORAL_TABLET | Freq: Every day | ORAL | Status: DC

## 2015-10-30 MED ORDER — TAMSULOSIN HCL 0.4 MG PO CAPS: ORAL_CAPSULE | ORAL | Status: DC

## 2015-10-30 MED ORDER — ROSUVASTATIN CALCIUM 40 MG PO TABS: 20.0000 mg | ORAL_TABLET | Freq: Every day | ORAL | Status: DC

## 2015-10-30 MED ORDER — NITROGLYCERIN 0.4 MG SL SUBL: SUBLINGUAL_TABLET | SUBLINGUAL | Status: DC

## 2015-10-30 MED ORDER — LOSARTAN POTASSIUM 25 MG PO TABS
25.0000 mg | ORAL_TABLET | Freq: Two times a day (BID) | ORAL | Status: DC
Start: 2015-10-30 — End: 2016-03-01

## 2015-11-12 ENCOUNTER — Other Ambulatory Visit: Payer: Self-pay | Admitting: *Deleted

## 2015-11-12 MED ORDER — CARVEDILOL 6.25 MG PO TABS: 6.2500 mg | ORAL_TABLET | Freq: Two times a day (BID) | ORAL | Status: DC

## 2016-01-06 ENCOUNTER — Encounter: Payer: Self-pay | Admitting: Family

## 2016-01-06 ENCOUNTER — Ambulatory Visit (INDEPENDENT_AMBULATORY_CARE_PROVIDER_SITE_OTHER): Payer: Commercial Managed Care - HMO | Admitting: Family

## 2016-01-06 ENCOUNTER — Other Ambulatory Visit (INDEPENDENT_AMBULATORY_CARE_PROVIDER_SITE_OTHER): Payer: Commercial Managed Care - HMO

## 2016-01-06 VITALS — BP 118/80 | HR 71 | Temp 97.5°F | Resp 16 | Ht 64.0 in | Wt 149.0 lb

## 2016-01-06 DIAGNOSIS — E782 Mixed hyperlipidemia: Secondary | ICD-10-CM | POA: Diagnosis not present

## 2016-01-06 DIAGNOSIS — R195 Other fecal abnormalities: Secondary | ICD-10-CM | POA: Diagnosis not present

## 2016-01-06 DIAGNOSIS — G479 Sleep disorder, unspecified: Secondary | ICD-10-CM

## 2016-01-06 LAB — BASIC METABOLIC PANEL
BUN: 20 mg/dL (ref 6–23)
CHLORIDE: 101 meq/L (ref 96–112)
CO2: 29 mEq/L (ref 19–32)
Calcium: 9.2 mg/dL (ref 8.4–10.5)
Creatinine, Ser: 0.88 mg/dL (ref 0.40–1.50)
GFR: 86.9 mL/min (ref >60.00)
Glucose, Bld: 101 mg/dL — ABNORMAL HIGH (ref 70–99)
POTASSIUM: 4.5 meq/L (ref 3.5–5.1)
Sodium: 134 mEq/L — ABNORMAL LOW (ref 135–145)

## 2016-01-06 LAB — LIPID PANEL
CHOLESTEROL: 117 mg/dL (ref 0–200)
HDL: 41.7 mg/dL (ref >39.00)
LDL CALC: 65 mg/dL (ref 0–99)
NonHDL: 75.26
Total CHOL/HDL Ratio: 3
Triglycerides: 49 mg/dL (ref 0.0–149.0)
VLDL: 9.8 mg/dL (ref 0.0–40.0)

## 2016-01-06 LAB — CBC
HEMATOCRIT: 43.1 % (ref 39.0–52.0)
HEMOGLOBIN: 14 g/dL (ref 13.0–17.0)
MCHC: 32.4 g/dL (ref 30.0–36.0)
MCV: 92.2 fl (ref 78.0–100.0)
PLATELETS: 108 10*3/uL — AB (ref 150.0–400.0)
RBC: 4.67 Mil/uL (ref 4.22–5.81)
RDW: 15.1 % (ref 11.5–15.5)
WBC: 5.4 10*3/uL (ref 4.0–10.5)

## 2016-01-06 MED ORDER — TRAZODONE HCL 50 MG PO TABS: 25.0000 mg | ORAL_TABLET | Freq: Every evening | ORAL | Status: DC | PRN

## 2016-01-06 NOTE — Progress Notes (Signed)
Subjective:    Patient ID: Victor Waters, male    DOB: November 13, 1926, 82 y.o.   MRN: [id number]  Chief Complaint  Patient presents with  . Establish Care    x3 months, has been having issues with BM has been having very liquidy stools that are black    HPI:  Victor Waters is a 82 y.o. male who  has a past medical history of Myocardial infarction ([place]) (2003); Hyperlipidemia; Anxiety; Fatigue; Insomnia; Renal artery stenosis ([place]); Hyponatremia (09/2012); Hypertension; Coronary artery disease; and echocardiogram. and presents today for an office visit.  1.) Bowel movements - Associated symptoms of loose bowel movement has been going on for about 3 months. Described on occasion as liquid. Previously seen in the office and believed to be related to fiber intake and was recommended to start a probiotic. Denies any improvement with the probiotic. Does have mild abdominal discomfort. Frequency of bowel movements ranges from 0-2. Currently maintained on Ducolax and occasionally Milk of Magnesia and Miralax. Denies blood in stool, nausea or vomiting.    2.) Sleep disturbance - Associated symptom of difficulty going to sleep has been going on for several years. Currently averaging about about 3-4 hours per sleep. Describes difficulty falling asleep and is able to stay asleep. Modifying factors include melatonin which has helped a little but is not really helping now. Has not tried any other OTC medications.   3.) Hyperlipidemia - currently maintained on Crestor. Takes medications prescribed and denies adverse side effects or myalgias. Due for cholesterol screening. Questions potential need for future medication.   Allergies  Allergen Reactions  . Lisinopril Cough    Renal artery stenosis by history; ACE inhibitor  would be relatively contraindicated  . Zithromax [Azithromycin]     02/15/14 N&V  . Niacin Other (See Comments)    Burning sensations in head     Current Outpatient Prescriptions  on File Prior to Visit  Medication Sig Dispense Refill  . aspirin 81 MG tablet Take 81 mg by mouth at bedtime.     Marland Kitchen CALCIUM-MAGNESIUM-ZINC PO Take 1 tablet by mouth daily.    . carvedilol (COREG) 6.25 MG tablet Take 1 tablet (6.25 mg total) by mouth 2 (two) times daily. 180 tablet 2  . cholecalciferol (VITAMIN D) 1000 UNITS tablet Take 2,000 Units by mouth daily.    Marland Kitchen ezetimibe (ZETIA) 10 MG tablet Take 0.5 tablets (5 mg total) by mouth daily. 45 tablet 3  . losartan (COZAAR) 25 MG tablet Take 1 tablet (25 mg total) by mouth 2 (two) times daily. 180 tablet 3  . Melatonin 3 MG TBDP Take 1 tablet by mouth at bedtime as needed (sleep). For sleep.    . nitroGLYCERIN (NITROSTAT) 0.4 MG SL tablet DISSOLVE 1 TAB UNDER TONGUE AS NEEDED FOR CHEST PAIN, MAY REPEAT IN 5 MINUTES AS DIRECTED. ER IF NO BETTER. 30 tablet 0  . Omega-3 Fatty Acids (FISH OIL) 1000 MG CAPS Take 1 capsule by mouth daily.      . rosuvastatin (CRESTOR) 40 MG tablet Take 0.5 tablets (20 mg total) by mouth daily. 45 tablet 3  . tamsulosin (FLOMAX) 0.4 MG CAPS capsule take 1 capsule by mouth daily after SUPPER 90 capsule 1   No current facility-administered medications on file prior to visit.     Past Surgical History  Procedure Laterality Date  . Cardiac catheterization  2003    revdeling severe three-vessel disease  . Coronary artery bypass graft  LIMA to LAD, SVG to diagonal, SVG to RCA, and SVG to circumflex  . Polypectomy      colon  . Renal artery stent  01-28-10    Review of Systems  Constitutional: Negative for fever and chills.  Gastrointestinal: Positive for diarrhea. Negative for nausea, vomiting, abdominal pain, constipation and blood in stool.  Psychiatric/Behavioral: Positive for sleep disturbance.      Objective:    BP 118/80 mmHg  Pulse 71  Temp(Src) 97.5 F (36.4 C) (Oral)  Resp 16  Ht 5\' 4"  (1.626 m)  Wt 149 lb (67.586 kg)  BMI 25.56 kg/m2  SpO2 97% Nursing note and vital signs  reviewed.  Physical Exam  Constitutional: He is oriented to person, place, and time. He appears well-developed and well-nourished. No distress.  Cardiovascular: Normal rate, regular rhythm, normal heart sounds and intact distal pulses.   Pulmonary/Chest: Effort normal and breath sounds normal.  Abdominal: Normal appearance and bowel sounds are normal. There is no hepatosplenomegaly. There is no tenderness. There is no rigidity, no guarding, no tenderness at McBurney's point and negative Murphy's sign.  Neurological: He is alert and oriented to person, place, and time.  Skin: Skin is warm and dry.  Psychiatric: He has a normal mood and affect. His behavior is normal. Judgment and thought content normal.       Assessment & Plan:   Problem List Items Addressed This Visit      Other   HYPERLIPIDEMIA    Stable with current dosage of Crestor. Obtain lipid profile. Pending lipid results consider discontinuation of medication per patient request.      Relevant Orders   Lipid Profile (Completed)   Sleep disturbance - Primary    Sleep disturbance that has been refractory to over-the-counter medications including melatonin. Discussed proper sleep hygiene. Start trazodone as needed for sleep. Follow-up if symptoms worsen or not improved with medication regimen.      Loose stools    Symptoms of loose stools most likely related to medication however cannot rule out underlying infection. Obtain stool culture, CBC, and basic metabolic panel. Obtain Hemoccult slide. Recommend discontinuation of medication for the next 2-3 days and increase fruit, vegetables, and fiber intake with concurrent intake of water as he does not drink a significant amount of fluids. Continue walking and moving around to help as well. Follow-up pending blood work or if symptoms worsen or do not improve.      Relevant Orders   Stool Culture   CBC (Completed)   Basic Metabolic Panel (BMET) (Completed)   POCT Occult Blood  Stool

## 2016-01-06 NOTE — Progress Notes (Signed)
Pre visit review using our clinic review tool, if applicable. No additional management support is needed unless otherwise documented below in the visit note. 

## 2016-01-06 NOTE — Patient Instructions (Addendum)
Thank you for choosing [facility].  Summary/Instructions:  Your prescription(s) have been submitted to your pharmacy or been printed and provided for you. Please take as directed and contact our office if you believe you are having problem(s) with the medication(s) or have any questions.  Please stop by the lab on the basement level of the building for your blood work. Your results will be released to [place] (or called to you) after review, usually within 72 hours after test completion. If any changes need to be made, you will be notified at that same time.  If your symptoms worsen or fail to improve, please contact our office for further instruction, or in case of emergency go directly to the emergency room at the closest medical facility.   Recommendations for improving sleep:   Avoid having pets sleep in the bedroom  Avoid caffeine consumption after 4pm  Keep bedroom cool and conducive to sleep  Avoid nicotine use, especially in the evening  Avoid exercise within 2-3 hours before bedtime  Stimulus Control:   Go to bed only when sleepy  Use the bedroom for sleep and sex only  Go to another room if you are unable to fall asleep within 15 to 20 minutes  Read or engage in other quiet activities and return to bed only when sleepy.  Insomnia Insomnia is a sleep disorder that makes it difficult to fall asleep or to stay asleep. Insomnia can cause tiredness (fatigue), low energy, difficulty concentrating, mood swings, and poor performance at work or school.  There are three different ways to classify insomnia:  Difficulty falling asleep.  Difficulty staying asleep.  Waking up too early in the morning. Any type of insomnia can be long-term (chronic) or short-term (acute). Both are common. Short-term insomnia usually lasts for three months or less. Chronic insomnia occurs at least three times a week for longer than three months. CAUSES  Insomnia may be caused by  another condition, situation, or substance, such as:  Anxiety.  Certain medicines.  Gastroesophageal reflux disease (GERD) or other gastrointestinal conditions.  Asthma or other breathing conditions.  Restless legs syndrome, sleep apnea, or other sleep disorders.  Chronic pain.  Menopause. This may include hot flashes.  Stroke.  Abuse of alcohol, tobacco, or illegal drugs.  Depression.  Caffeine.   Neurological disorders, such as Alzheimer disease.  An overactive thyroid (hyperthyroidism). The cause of insomnia may not be known. RISK FACTORS Risk factors for insomnia include:  Gender. Women are more commonly affected than men.  Age. Insomnia is more common as you get older.  Stress. This may involve your professional or personal life.  Income. Insomnia is more common in people with lower income.  Lack of exercise.   Irregular work schedule or night shifts.  Traveling between different time zones. SIGNS AND SYMPTOMS If you have insomnia, trouble falling asleep or trouble staying asleep is the main symptom. This may lead to other symptoms, such as:  Feeling fatigued.  Feeling nervous about going to sleep.  Not feeling rested in the morning.  Having trouble concentrating.  Feeling irritable, anxious, or depressed. TREATMENT  Treatment for insomnia depends on the cause. If your insomnia is caused by an underlying condition, treatment will focus on addressing the condition. Treatment may also include:   Medicines to help you sleep.  Counseling or therapy.  Lifestyle adjustments. HOME CARE INSTRUCTIONS   Take medicines only as directed by your health care provider.  Keep regular sleeping and waking hours. Avoid naps.  Keep a sleep diary to help you and your health care provider figure out what could be causing your insomnia. Include:   When you sleep.  When you wake up during the night.  How well you sleep.   How rested you feel the next  day.  Any side effects of medicines you are taking.  What you eat and drink.   Make your bedroom a comfortable place where it is easy to fall asleep:  Put up shades or special blackout curtains to block light from outside.  Use a white noise machine to block noise.  Keep the temperature cool.   Exercise regularly as directed by your health care provider. Avoid exercising right before bedtime.  Use relaxation techniques to manage stress. Ask your health care provider to suggest some techniques that may work well for you. These may include:  Breathing exercises.  Routines to release muscle tension.  Visualizing peaceful scenes.  Cut back on alcohol, caffeinated beverages, and cigarettes, especially close to bedtime. These can disrupt your sleep.  Do not overeat or eat spicy foods right before bedtime. This can lead to digestive discomfort that can make it hard for you to sleep.  Limit screen use before bedtime. This includes:  Watching TV.  Using your smartphone, tablet, and computer.  Stick to a routine. This can help you fall asleep faster. Try to do a quiet activity, brush your teeth, and go to bed at the same time each night.  Get out of bed if you are still awake after 15 minutes of trying to sleep. Keep the lights down, but try reading or doing a quiet activity. When you feel sleepy, go back to bed.  Make sure that you drive carefully. Avoid driving if you feel very sleepy.  Keep all follow-up appointments as directed by your health care provider. This is important. SEEK MEDICAL CARE IF:   You are tired throughout the day or have trouble in your daily routine due to sleepiness.  You continue to have sleep problems or your sleep problems get worse. SEEK IMMEDIATE MEDICAL CARE IF:   You have serious thoughts about hurting yourself or someone else.   This information is not intended to replace advice given to you by your health care provider. Make sure you discuss  any questions you have with your health care provider.   Document Released: 12/03/2000 Document Revised: 08/27/2015 Document Reviewed: 09/06/2014 Elsevier Interactive Patient Education [employer].

## 2016-01-06 NOTE — Assessment & Plan Note (Signed)
Sleep disturbance that has been refractory to over-the-counter medications including melatonin. Discussed proper sleep hygiene. Start trazodone as needed for sleep. Follow-up if symptoms worsen or not improved with medication regimen.

## 2016-01-06 NOTE — Assessment & Plan Note (Signed)
Stable with current dosage of Crestor. Obtain lipid profile. Pending lipid results consider discontinuation of medication per patient request.

## 2016-01-06 NOTE — Assessment & Plan Note (Signed)
Symptoms of loose stools most likely related to medication however cannot rule out underlying infection. Obtain stool culture, CBC, and basic metabolic panel. Obtain Hemoccult slide. Recommend discontinuation of medication for the next 2-3 days and increase fruit, vegetables, and fiber intake with concurrent intake of water as he does not drink a significant amount of fluids. Continue walking and moving around to help as well. Follow-up pending blood work or if symptoms worsen or do not improve.

## 2016-01-15 ENCOUNTER — Telehealth: Payer: Self-pay | Admitting: Family

## 2016-01-15 ENCOUNTER — Other Ambulatory Visit (INDEPENDENT_AMBULATORY_CARE_PROVIDER_SITE_OTHER): Payer: Commercial Managed Care - HMO

## 2016-01-15 ENCOUNTER — Other Ambulatory Visit: Payer: Self-pay

## 2016-01-15 DIAGNOSIS — R195 Other fecal abnormalities: Secondary | ICD-10-CM | POA: Diagnosis not present

## 2016-01-15 LAB — HEMOCCULT SLIDES (X 3 CARDS)
OCCULT 1: NEGATIVE
OCCULT 2: NEGATIVE

## 2016-01-15 NOTE — Telephone Encounter (Signed)
Please inform patient that his stool sample is negative for blood.

## 2016-01-20 NOTE — Telephone Encounter (Signed)
Pts wife is aware of results.

## 2016-01-28 ENCOUNTER — Ambulatory Visit (INDEPENDENT_AMBULATORY_CARE_PROVIDER_SITE_OTHER): Payer: Commercial Managed Care - HMO | Admitting: Nurse Practitioner

## 2016-01-28 ENCOUNTER — Encounter: Payer: Self-pay | Admitting: Nurse Practitioner

## 2016-01-28 ENCOUNTER — Ambulatory Visit (INDEPENDENT_AMBULATORY_CARE_PROVIDER_SITE_OTHER)
Admission: RE | Admit: 2016-01-28 | Discharge: 2016-01-28 | Disposition: A | Discharge status: Home / Self Care | Payer: Commercial Managed Care - HMO | Source: Ambulatory Visit | Attending: Nurse Practitioner | Admitting: Nurse Practitioner

## 2016-01-28 VITALS — BP 130/70 | HR 81 | Temp 98.3°F | Ht 64.0 in | Wt 146.0 lb

## 2016-01-28 DIAGNOSIS — R05 Cough: Secondary | ICD-10-CM | POA: Diagnosis not present

## 2016-01-28 DIAGNOSIS — B9789 Other viral agents as the cause of diseases classified elsewhere: Principal | ICD-10-CM

## 2016-01-28 DIAGNOSIS — J069 Acute upper respiratory infection, unspecified: Secondary | ICD-10-CM

## 2016-01-28 DIAGNOSIS — R509 Fever, unspecified: Secondary | ICD-10-CM | POA: Diagnosis not present

## 2016-01-28 DIAGNOSIS — R0602 Shortness of breath: Secondary | ICD-10-CM | POA: Diagnosis not present

## 2016-01-28 MED ORDER — ONDANSETRON 4 MG PO TBDP: 4.0000 mg | ORAL_TABLET | Freq: Three times a day (TID) | ORAL | Status: DC | PRN

## 2016-01-28 NOTE — Progress Notes (Signed)
Patient ID: Victor Waters, male    DOB: [birth year]  Age: 82 y.o. MRN: [id number]  CC: Nausea; Cough; and Migraine   HPI Victor Waters presents for CC of N/V and cough.   1) Pt reports N/V yesterday  Non-productive cough- vomiting was post-tussive  Lost 2 meals breakfast and dinner yesterday due to coughing then vomiting  Phlegm- clear  No appetite with stomach cramping HA and chills Burned leaves last week   Treatment to date:  Tylenol   Sick contacts- Granddaughter- 13 yo   History Victor Waters has a past medical history of Myocardial infarction ([place]) (2003); Hyperlipidemia; Anxiety; Fatigue; Insomnia; Renal artery stenosis ([place]); Hyponatremia (09/2012); Hypertension; Coronary artery disease; and echocardiogram.   He has past surgical history that includes Cardiac catheterization (2003); Coronary artery bypass graft; Polypectomy; and Renal artery stent (01-28-10).   His family history includes Colon cancer in his other; Diabetes in his mother; Heart attack (age of onset: 77) in his father; Stroke in his mother.He reports that he has never smoked. He has never used smokeless tobacco. He reports that he does not drink alcohol or use illicit drugs.  Outpatient Prescriptions Prior to Visit  Medication Sig Dispense Refill  . aspirin 81 MG tablet Take 81 mg by mouth at bedtime.     Marland Kitchen CALCIUM-MAGNESIUM-ZINC PO Take 1 tablet by mouth daily.    . carvedilol (COREG) 6.25 MG tablet Take 1 tablet (6.25 mg total) by mouth 2 (two) times daily. 180 tablet 2  . cholecalciferol (VITAMIN D) 1000 UNITS tablet Take 2,000 Units by mouth daily.    Marland Kitchen ezetimibe (ZETIA) 10 MG tablet Take 0.5 tablets (5 mg total) by mouth daily. 45 tablet 3  . losartan (COZAAR) 25 MG tablet Take 1 tablet (25 mg total) by mouth 2 (two) times daily. 180 tablet 3  . Melatonin 3 MG TBDP Take 1 tablet by mouth at bedtime as needed (sleep). For sleep.    . Omega-3 Fatty Acids (FISH OIL) 1000 MG CAPS Take 1 capsule by mouth daily.       . rosuvastatin (CRESTOR) 40 MG tablet Take 0.5 tablets (20 mg total) by mouth daily. 45 tablet 3  . tamsulosin (FLOMAX) 0.4 MG CAPS capsule take 1 capsule by mouth daily after SUPPER 90 capsule 1  . traZODone (DESYREL) 50 MG tablet Take 0.5-1 tablets (25-50 mg total) by mouth at bedtime as needed for sleep. 30 tablet 0  . nitroGLYCERIN (NITROSTAT) 0.4 MG SL tablet DISSOLVE 1 TAB UNDER TONGUE AS NEEDED FOR CHEST PAIN, MAY REPEAT IN 5 MINUTES AS DIRECTED. ER IF NO BETTER. (Patient not taking: Reported on 01/28/2016) 30 tablet 0   No facility-administered medications prior to visit.    ROS Review of Systems  Constitutional: Positive for chills and appetite change. Negative for fever, diaphoresis and fatigue.       Decreased appetite   Respiratory: Positive for cough. Negative for chest tightness and wheezing.   Gastrointestinal: Positive for nausea and vomiting. Negative for diarrhea and constipation.  Skin: Negative for rash.  Neurological: Negative for dizziness and light-headedness.    Objective:  BP 130/70 mmHg  Pulse 81  Temp(Src) 98.3 F (36.8 C) (Oral)  Ht 5\' 4"  (1.626 m)  Wt 146 lb (66.225 kg)  BMI 25.05 kg/m2  SpO2 95%  Physical Exam  Constitutional: He is oriented to person, place, and time. He appears well-developed and well-nourished. No distress.  HENT:  Head: Normocephalic and atraumatic.  Right Ear: External ear normal.  Left Ear: External ear normal.  Cardiovascular: Normal rate, regular rhythm and normal heart sounds.  Exam reveals no gallop and no friction rub.   No murmur heard. Pulmonary/Chest: Effort normal and breath sounds normal. No respiratory distress. He has no wheezes. He has no rales. He exhibits no tenderness.  Left upper lobe- rhonchi then cleared with coughing, decreased breath sounds bilaterally  Abdominal: Soft. He exhibits no distension and no mass. There is no tenderness. There is no rebound and no guarding.  Active bowel sounds  Neurological:  He is alert and oriented to person, place, and time.  Skin: Skin is warm and dry. No rash noted. He is not diaphoretic.  Psychiatric: He has a normal mood and affect. His behavior is normal. Judgment and thought content normal.   Assessment & Plan:   Cross was seen today for nausea, cough and migraine.  Diagnoses and all orders for this visit:  Viral URI with cough -     DG Chest 2 View; Future  Other orders -     ondansetron (ZOFRAN ODT) 4 MG disintegrating tablet; Take 1 tablet (4 mg total) by mouth every 8 (eight) hours as needed for nausea or vomiting.   I am having Mr. Monrreal start on ondansetron. I am also having him maintain his aspirin, Fish Oil, Melatonin, cholecalciferol, CALCIUM-MAGNESIUM-ZINC PO, losartan, nitroGLYCERIN, rosuvastatin, tamsulosin, ezetimibe, carvedilol, traZODone, and Polyethylene Glycol 3350.  Meds ordered this encounter  Medications  . Polyethylene Glycol 3350 GRAN    Sig: Reported on 01/28/2016  . ondansetron (ZOFRAN ODT) 4 MG disintegrating tablet    Sig: Take 1 tablet (4 mg total) by mouth every 8 (eight) hours as needed for nausea or vomiting.    Dispense:  20 tablet    Refill:  0    Order Specific Question:  Supervising Provider    Answer:  Crecencio Mc [[id number]]     Follow-up: Return if symptoms worsen or fail to improve.

## 2016-01-28 NOTE — Patient Instructions (Signed)
Please get your Chest X-ray and we will call with results and next steps.   Delsym or Robitussin (plain not DM) over the counter for the cough.   Zofran melts as directed for nausea

## 2016-01-31 DIAGNOSIS — B9789 Other viral agents as the cause of diseases classified elsewhere: Principal | ICD-10-CM

## 2016-01-31 DIAGNOSIS — J069 Acute upper respiratory infection, unspecified: Secondary | ICD-10-CM | POA: Insufficient documentation

## 2016-01-31 NOTE — Assessment & Plan Note (Signed)
New onset Probably viral in nature Rule out pneumonia with chest x-ray Continue OTC medications Results given for nausea Advised aggressive fluid replacement Return to care precautions given to wife and both verbalized understanding

## 2016-02-08 ENCOUNTER — Encounter: Payer: Self-pay | Admitting: Family

## 2016-02-09 MED ORDER — PREDNISONE 20 MG PO TABS: 20.0000 mg | ORAL_TABLET | Freq: Two times a day (BID) | ORAL | Status: DC

## 2016-02-09 MED ORDER — LEVOFLOXACIN 500 MG PO TABS: 500.0000 mg | ORAL_TABLET | Freq: Every day | ORAL | Status: DC

## 2016-02-09 NOTE — Telephone Encounter (Signed)
Medication sent to pharmacy  

## 2016-02-09 NOTE — Telephone Encounter (Signed)
Pt's wife called regarding this email. He is coughing so bad where it is making him throw up. He's not running a fever. He's been taking the delsym and it is not working.

## 2016-02-16 NOTE — Progress Notes (Signed)
Cardiology Office Note:    Date:  02/17/2016   ID:  Victor Waters, DOB 08/30/1926, MRN [id number]  PCP:  Unice Cobble, MD  Cardiologist:  Dr. Minus Breeding   Electrophysiologist:  n/a  Chief Complaint  Patient presents with  . Fatigue    History of Present Illness:     Victor Waters is a 82 y.o. male with a hx of CAD, status post CABG, dilated cardiomyopathy, renal artery stenosis, status post right renal artery stent in 01/2010, HTN, HL. Patient was admitted to the hospital in 1/16 with weakness and confusion. He was noted and frequent PVCs. Beta blocker dose was adjusted. Echocardiogram demonstrated worsening of function at that time with an EF of 40%. Follow-up echocardiogram in 8/16 demonstrated worsening LV function with any of 25-30%. Medications were adjusted for his cardiomyopathy conservative therapy was preferred by the patient and he was set up for [employer]. Nuclear stress test in 9/16 demonstrated normal perfusion, low risk study. Last seen by Dr. Percival Spanish 10/16.   He comes in today with his wife in great granddaughter. He comes in for further evaluation of dyspnea and fatigue. He has struggled with bronchitis over the last several weeks. He just completed a round of Levaquin. Chest x-ray recently was clear. His cough is improved, overall. Cough seems to be worse at night when he lays down. He sleeps most of the day. He has had issues with dyspnea with minimal activity. He had a few episodes of chest pain relieved by nitroglycerin. He has not had any further chest pain in the last 2 weeks. He has been sleeping on 3 pillows to help with his cough. Denies PND. He does have mild LE edema without significant change. Denies syncope. Appetite is good. Weight is down about 5 pounds.   Past Medical History  Diagnosis Date  . Myocardial infarction ([place]) 2003    hx of   . Hyperlipidemia   . Anxiety   . Fatigue   . Insomnia   . Renal artery stenosis (HCC)     bilateral;   s/p R RA stent 2011  . Hyponatremia 09/2012    Associated with postural hypotension and confusion  . Hypertension   . Coronary artery disease     a. s/p CABG;  b. LHC (2/11):  LM 30-40, pLAD 95-99 then 80 and 90, mCFX 90, pRCA occluded, S-dRCA ok, S-D1 ok, S-OM patent with 50, L-LAD ok.  Med Rx recommended.  c.  Myoview (5/13):  Low risk with small fixed inf defect c/w scar, no ischemia, EF 48%.  d.  Echo (6/13):  EF 55-60%, inf HK, Gr 1 DD, MAC;  e. Lexiscan Myoview (10/14):  Low risk, EF 38%, inferobasal infarct, no ischemia.  Marland Kitchen Hx of echocardiogram     Echo (10/14):  EF 55-60%, Gr 1DD, MAC, mild MR, mild LAE.    Past Surgical History  Procedure Laterality Date  . Cardiac catheterization  2003    revdeling severe three-vessel disease  . Coronary artery bypass graft      LIMA to LAD, SVG to diagonal, SVG to RCA, and SVG to circumflex  . Polypectomy      colon  . Renal artery stent  01-28-10    Current Medications: Outpatient Prescriptions Prior to Visit  Medication Sig Dispense Refill  . aspirin 81 MG tablet Take 81 mg by mouth at bedtime.     Marland Kitchen CALCIUM-MAGNESIUM-ZINC PO Take 1 tablet by mouth daily.    . carvedilol (COREG)  6.25 MG tablet Take 1 tablet (6.25 mg total) by mouth 2 (two) times daily. 180 tablet 2  . cholecalciferol (VITAMIN D) 1000 UNITS tablet Take 2,000 Units by mouth daily.    Marland Kitchen ezetimibe (ZETIA) 10 MG tablet Take 0.5 tablets (5 mg total) by mouth daily. 45 tablet 3  . losartan (COZAAR) 25 MG tablet Take 1 tablet (25 mg total) by mouth 2 (two) times daily. 180 tablet 3  . Melatonin 3 MG TBDP Take 1 tablet by mouth at bedtime as needed (sleep). For sleep.    . nitroGLYCERIN (NITROSTAT) 0.4 MG SL tablet DISSOLVE 1 TAB UNDER TONGUE AS NEEDED FOR CHEST PAIN, MAY REPEAT IN 5 MINUTES AS DIRECTED. ER IF NO BETTER. 30 tablet 0  . Omega-3 Fatty Acids (FISH OIL) 1000 MG CAPS Take 1 capsule by mouth daily.      . rosuvastatin (CRESTOR) 40 MG tablet Take 0.5 tablets (20 mg  total) by mouth daily. 45 tablet 3  . tamsulosin (FLOMAX) 0.4 MG CAPS capsule take 1 capsule by mouth daily after SUPPER 90 capsule 1  . traZODone (DESYREL) 50 MG tablet Take 0.5-1 tablets (25-50 mg total) by mouth at bedtime as needed for sleep. 30 tablet 0  . levofloxacin (LEVAQUIN) 500 MG tablet Take 1 tablet (500 mg total) by mouth daily. 7 tablet 0  . Polyethylene Glycol 3350 GRAN Reported on 01/28/2016    . predniSONE (DELTASONE) 20 MG tablet Take 1 tablet (20 mg total) by mouth 2 (two) times daily with a meal. 10 tablet 0  . ondansetron (ZOFRAN ODT) 4 MG disintegrating tablet Take 1 tablet (4 mg total) by mouth every 8 (eight) hours as needed for nausea or vomiting. (Patient not taking: Reported on 02/17/2016) 20 tablet 0   No facility-administered medications prior to visit.     Allergies:   Lisinopril; Zithromax; and Niacin   Social History   Social History  . Marital Status: Married    Spouse Name: N/A  . Number of Children: N/A  . Years of Education: N/A   Social History Main Topics  . Smoking status: Never Smoker   . Smokeless tobacco: Never Used  . Alcohol Use: No  . Drug Use: No  . Sexual Activity: Not Asked   Other Topics Concern  . None   Social History Narrative   Married   Lives with wife and 16year old ggd     Family History:  The patient's family history includes Colon cancer in his other; Diabetes in his mother; Heart attack (age of onset: 59) in his father; Stroke in his mother.   ROS:   Please see the history of present illness.    Review of Systems  Constitution: Positive for decreased appetite, malaise/fatigue and weight loss.  HENT: Positive for headaches and hearing loss.   Cardiovascular: Positive for chest pain, dyspnea on exertion and irregular heartbeat.  Respiratory: Positive for cough, shortness of breath, snoring and wheezing.   Hematologic/Lymphatic: Bruises/bleeds easily.  Skin: Positive for rash.  Musculoskeletal: Positive for back pain,  joint pain and myalgias.  Gastrointestinal: Positive for diarrhea.  Genitourinary: Positive for incomplete emptying.  Neurological: Positive for dizziness and loss of balance.  Psychiatric/Behavioral: The patient is nervous/anxious.   All other systems reviewed and are negative.   Physical Exam:    VS:  BP 120/50 mmHg  Pulse 98  Ht 5\' 4"  (1.626 m)  Wt 146 lb 1.9 oz (66.28 kg)  BMI 25.07 kg/m2  SpO2 97%   GEN:  Well nourished, well developed, in no acute distress HEENT: normal Neck: JVP 5-6 cm, no masses Cardiac: Normal S1/S2, irregular rhythm; no obvious murmurs, trace-1+ bilateral LE edema L >R   Respiratory:  Decreased breath sounds bilaterally; no wheezing, rhonchi or rales GI: soft, nontender  MS: no deformity or atrophy Skin: warm and dry  Neuro:   no focal deficits  Psych: Alert and oriented x 3, normal affect  Wt Readings from Last 3 Encounters:  02/17/16 146 lb 1.9 oz (66.28 kg)  01/28/16 146 lb (66.225 kg)  01/06/16 149 lb (67.586 kg)      Studies/Labs Reviewed:     EKG:  EKG is  ordered today.  The ekg ordered today demonstrates NSR, HR 98, normal axis, nonspecific ST-T wave changes, first-degree AV block, PR interval 224 ms, QTC 472 ms, frequent PVCs with ventricular couplets    Recent Labs: 10/16/2015: TSH 1.94 01/06/2016: BUN 20; Creatinine, Ser 0.88; Hemoglobin 14.0; Platelets 108.0*; Potassium 4.5; Sodium 134*   Recent Lipid Panel    Component Value Date/Time   CHOL 117 01/06/2016 1121   TRIG 49.0 01/06/2016 1121   HDL 41.70 01/06/2016 1121   CHOLHDL 3 01/06/2016 1121   VLDL 9.8 01/06/2016 1121   LDLCALC 65 01/06/2016 1121    Additional studies/ records that were reviewed today include:   Dg Chest 2 View   01/28/2016  IMPRESSION: Post CABG. No acute abnormalities. Electronically Signed   By: Lavonia Dana M.D.   On: 01/28/2016 12:55    Myoview 9/16 No significant perfusion defects. Dilated LV cavity. This study was not gated due to frequent atrial  ectopy. Low risk study  Echo 8/16 Mild LVH, EF 25-30%, diffuse HK, grade 1 diastolic dysfunction, MAC, mild MR, mild LAE, atrial septal aneurysm  Myoview 10/14 Low risk stress nuclear study. 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 No significant reversibility.  LV Ejection Fraction: 38%. LV Wall Motion: Mild global hypokinesis. Since last study LV EF has decreased.  Myoview (5/13) Low risk with small fixed inf defect c/w scar, no ischemia, EF 48%.   Echo (6/13) EF 55-60%, inf HK, Gr 1 DD, MAC.   LHC (2/11) LM 30-40, pLAD 95-99 then 80 and 90, mCFX 90, pRCA occluded, S-dRCA ok, S-D1 ok, S-OM patent with 50, L-LAD ok. Med Rx recommended.    ASSESSMENT:     1. Other fatigue   2. Shortness of breath   3. Coronary artery disease involving native coronary artery of native heart with angina pectoris ([place])   4. Ventricular ectopy   5. Essential hypertension   6. Cardiomyopathy ([place])   7. HYPERLIPIDEMIA     PLAN:     In order of problems listed above:  1.Fatigue  - Overall, I believe that his fatigue is probably residual from his recent upper respiratory tract infection. He had a recent chest x-ray that was clear. His cough is improved. His cardiomyopathy may also be playing a role in his fatigue. He has been more short of breath. He does not look particularly volume overloaded. I suppose he may have some mild volume excess in the setting of recent respiratory illness.   -  Labs today: BMET, CBC, magnesium, TSH, BNP   2. Shortness of breath - As noted, he may have mild volume excess in the setting of recent respiratory illness. I will give him Lasix 20 mg to take once daily for the next 2 days. He may then take it as needed. He should eat a banana  each day to keep his potassium from dropping. It is BNP is significantly elevated, I will keep him on a more regular dose of furosemide.   3. CAD -As noted, he has had recent symptoms of chest discomfort. This may have been  related to his recent respiratory illness. He has not had any further chest discomfort in the past 2 weeks.  He had no evidence of ischemia on his recent perfusion study. Med management has been recommended. I asked him today if he would consider cardiac catheterization in the future if his symptoms should progress. At this point, he needs to discuss this further with his wife.   4. PVC - These have been asymptomatic. He does have couplets on ECG today. Check BMET, magnesium.   5. HTN - Controlled.  6. Dilated CM - Conservative management has been recommended.  Recent nuclear stress test demonstrated normal perfusion. Question if PVC burden is large enough to contribute to his cardiomyopathy. Continue beta blocker, ARB. Consider adding spironolactone if he requires ongoing diuresis.  As noted, I discussed cardiac cath with him.  If his symptoms progress, R/L HC may become more helpful.  He will consider this.  7. HL - Recent lipids were excellent.     Medication Adjustments/Labs and Tests Ordered: Current medicines are reviewed at length with the patient today.  Concerns regarding medicines are outlined above.  Medication changes, Labs and Tests ordered today are outlined in the Patient Instructions noted below. Patient Instructions  Medication Instructions:  1. START LASIX 20 MG DAILY FOR 2 DAYS THEN CHANGE TO DAILY ONLY AS NEEDED FOR INCREASED SWELLING   MAKE SURE TO EAT A BANANA EACH DAY YOU TAKE THE LASIX  Labwork: TODAY BMET, MAGNESIUM LEVEL, CBC/WDIFF, TSH, BNP  Testing/Procedures: NONE  Follow-Up: DR. Percival Spanish 2-3 WEEKS OR PA/NP   Any Other Special Instructions Will Be Listed Below (If Applicable).  If you need a refill on your cardiac medications before your next appointment, please call your pharmacy.   Signed, Richardson Dopp, PA-C  02/17/2016 2:53 PM    [facility] [address], [place], [place]  [address] Phone: [phone]; Fax: [phone]

## 2016-02-17 ENCOUNTER — Ambulatory Visit: Payer: Commercial Managed Care - HMO | Admitting: Physician Assistant

## 2016-02-17 ENCOUNTER — Encounter: Payer: Self-pay | Admitting: Physician Assistant

## 2016-02-17 VITALS — BP 120/50 | HR 98 | Ht 64.0 in | Wt 146.1 lb

## 2016-02-17 DIAGNOSIS — R0602 Shortness of breath: Secondary | ICD-10-CM

## 2016-02-17 DIAGNOSIS — I2510 Atherosclerotic heart disease of native coronary artery without angina pectoris: Secondary | ICD-10-CM

## 2016-02-17 DIAGNOSIS — I25119 Atherosclerotic heart disease of native coronary artery with unspecified angina pectoris: Secondary | ICD-10-CM

## 2016-02-17 DIAGNOSIS — I10 Essential (primary) hypertension: Secondary | ICD-10-CM | POA: Diagnosis not present

## 2016-02-17 DIAGNOSIS — I429 Cardiomyopathy, unspecified: Secondary | ICD-10-CM

## 2016-02-17 DIAGNOSIS — R5383 Other fatigue: Secondary | ICD-10-CM

## 2016-02-17 DIAGNOSIS — E782 Mixed hyperlipidemia: Secondary | ICD-10-CM

## 2016-02-17 DIAGNOSIS — I493 Ventricular premature depolarization: Secondary | ICD-10-CM | POA: Diagnosis not present

## 2016-02-17 LAB — BASIC METABOLIC PANEL
BUN: 30 mg/dL — ABNORMAL HIGH (ref 7–25)
CALCIUM: 9.2 mg/dL (ref 8.6–10.3)
CO2: 29 mmol/L (ref 20–31)
CREATININE: 0.97 mg/dL (ref 0.70–1.11)
Chloride: 95 mmol/L — ABNORMAL LOW (ref 98–110)
Glucose, Bld: 126 mg/dL — ABNORMAL HIGH (ref 65–99)
Potassium: 5 mmol/L (ref 3.5–5.3)
Sodium: 133 mmol/L — ABNORMAL LOW (ref 135–146)

## 2016-02-17 LAB — TSH: TSH: 1.82 mIU/L (ref 0.40–4.50)

## 2016-02-17 LAB — MAGNESIUM: Magnesium: 2.2 mg/dL (ref 1.5–2.5)

## 2016-02-17 MED ORDER — FUROSEMIDE 20 MG PO TABS: 20.0000 mg | ORAL_TABLET | ORAL | Status: DC

## 2016-02-17 NOTE — Patient Instructions (Addendum)
Medication Instructions:  1. START LASIX 20 MG DAILY FOR 2 DAYS THEN CHANGE TO DAILY ONLY AS NEEDED FOR INCREASED SWELLING   MAKE SURE TO EAT A BANANA EACH DAY YOU TAKE THE LASIX  Labwork: TODAY BMET, MAGNESIUM LEVEL, CBC/WDIFF, TSH, BNP  Testing/Procedures: NONE  Follow-Up: DR. Percival Spanish 2-3 WEEKS OR PA/NP   Any Other Special Instructions Will Be Listed Below (If Applicable).  If you need a refill on your cardiac medications before your next appointment, please call your pharmacy.

## 2016-02-18 ENCOUNTER — Telehealth: Payer: Self-pay | Admitting: *Deleted

## 2016-02-18 LAB — CBC WITH DIFFERENTIAL/PLATELET
Basophils Absolute: 0 10*3/uL (ref 0.0–0.1)
Basophils Relative: 0 % (ref 0–1)
EOS ABS: 0.1 10*3/uL (ref 0.0–0.7)
EOS PCT: 1 % (ref 0–5)
HCT: 42.2 % (ref 39.0–52.0)
Hemoglobin: 14.2 g/dL (ref 13.0–17.0)
LYMPHS ABS: 0.7 10*3/uL (ref 0.7–4.0)
Lymphocytes Relative: 10 % — ABNORMAL LOW (ref 12–46)
MCH: 30.7 pg (ref 26.0–34.0)
MCHC: 33.6 g/dL (ref 30.0–36.0)
MCV: 91.3 fL (ref 78.0–100.0)
MONO ABS: 0.8 10*3/uL (ref 0.1–1.0)
MONOS PCT: 11 % (ref 3–12)
MPV: 9.7 fL (ref 8.6–12.4)
Neutro Abs: 5.6 10*3/uL (ref 1.7–7.7)
Neutrophils Relative %: 78 % — ABNORMAL HIGH (ref 43–77)
PLATELETS: 220 10*3/uL (ref 150–400)
RBC: 4.62 MIL/uL (ref 4.22–5.81)
RDW: 14.7 % (ref 11.5–15.5)
WBC: 7.2 10*3/uL (ref 4.0–10.5)

## 2016-02-18 LAB — BRAIN NATRIURETIC PEPTIDE: Brain Natriuretic Peptide: 102.2 pg/mL — ABNORMAL HIGH (ref <100)

## 2016-02-18 NOTE — Telephone Encounter (Signed)
Pt and wife both notified of lab resultsby phone with verbal understanding. Confirmed appt with Dr. Percival Spanish 03/08/16. Wife asked did pt need to see a Nephrologist. I stated not being advised however could s/w Dr. Percival Spanish for advice on this matter.

## 2016-02-23 ENCOUNTER — Telehealth: Payer: Self-pay | Admitting: Cardiology

## 2016-02-23 NOTE — Telephone Encounter (Signed)
Pt seems to be having side effects from the Rosuvastatin 40 mg. Please call asap to advise.

## 2016-02-23 NOTE — Telephone Encounter (Signed)
No answer when dialed. No VM pickup.

## 2016-02-24 NOTE — Telephone Encounter (Signed)
Spoke with pt wife, she reports the pt is having all the symptoms listed on the paper from the pharmacy regarding rosuvastatin. Okay given for pt to stop the medication. He has a f/u appt next week with dr hochrein. He will stay off the rosuvastatin until seen.

## 2016-02-29 NOTE — Progress Notes (Signed)
HPI  The patient presents for follow up of CAD, status post CABG, dilated cardiomyopathy, renal artery stenosis, status post right renal artery stent in 01/2010, HTN, HL. Patient was admitted to the hospital in 1/16 with weakness and confusion. He was noted and frequent PVCs. Beta blocker dose was adjusted. Echocardiogram demonstrated worsening of function at that time with an EF of 40%. Follow-up echocardiogram in 8/16 demonstrated worsening LV function with any of 25-30%. Medications were adjusted for his cardiomyopathy conservative therapy was preferred by the patient and he was set up for [employer]. Nuclear stress test in 9/16 demonstrated normal perfusion, low risk study. He had the flu this year and was sick with this.  He last saw Richardson Dopp in Feb.  he did have some dyspnea but his BNP was not particularly elevated at 102. He's been having decreased memory. He's been fatigued. He has continued mild lower extremity swelling. However, his renal insufficiency and lack of evidence of left-sided failure suggested we should go up on his diuretic. He does not keep his feet elevated. His cough is better and he seems to have recovered from the flu though he still very fatigued. He is not having any PND or orthopnea. He's not having palpitations, presyncope or syncope. He just is now getting around very slowly.  Allergies  Allergen Reactions  . Lisinopril Cough    Renal artery stenosis by history; ACE inhibitor  would be relatively contraindicated  . Zithromax [Azithromycin]     02/15/14 N&V  . Niacin Other (See Comments)    Burning sensations in head    Current Outpatient Prescriptions  Medication Sig Dispense Refill  . aspirin 81 MG tablet Take 81 mg by mouth at bedtime.     Marland Kitchen CALCIUM-MAGNESIUM-ZINC PO Take 1 tablet by mouth daily.    . carvedilol (COREG) 6.25 MG tablet Take 1 tablet (6.25 mg total) by mouth 2 (two) times daily. 180 tablet 2  . cholecalciferol (VITAMIN D) 1000 UNITS  tablet Take 2,000 Units by mouth daily.    Marland Kitchen ezetimibe (ZETIA) 10 MG tablet Take 0.5 tablets (5 mg total) by mouth daily. 45 tablet 3  . furosemide (LASIX) 20 MG tablet Take 1 tablet (20 mg total) by mouth as directed. Take 20 mg daily for 2 days then change to daily AS NEEDED for increased swelling 30 tablet 3  . losartan (COZAAR) 25 MG tablet Take 1 tablet (25 mg total) by mouth 2 (two) times daily. 180 tablet 3  . Melatonin 3 MG TBDP Take 1 tablet by mouth at bedtime as needed (sleep). For sleep.    . nitroGLYCERIN (NITROSTAT) 0.4 MG SL tablet DISSOLVE 1 TAB UNDER TONGUE AS NEEDED FOR CHEST PAIN, MAY REPEAT IN 5 MINUTES AS DIRECTED. ER IF NO BETTER. 30 tablet 0  . Omega-3 Fatty Acids (FISH OIL) 1000 MG CAPS Take 1 capsule by mouth daily.      . rosuvastatin (CRESTOR) 40 MG tablet Take 0.5 tablets (20 mg total) by mouth daily. 45 tablet 3  . tamsulosin (FLOMAX) 0.4 MG CAPS capsule take 1 capsule by mouth daily after SUPPER 90 capsule 1  . traZODone (DESYREL) 50 MG tablet Take 0.5-1 tablets (25-50 mg total) by mouth at bedtime as needed for sleep. 30 tablet 0   No current facility-administered medications for this visit.    Past Medical History  Diagnosis Date  . Myocardial infarction ([place]) 2003    hx of   . Hyperlipidemia   . Anxiety   .  Fatigue   . Insomnia   . Renal artery stenosis (HCC)     bilateral;  s/p R RA stent 2011  . Hyponatremia 09/2012    Associated with postural hypotension and confusion  . Hypertension   . Coronary artery disease     a. s/p CABG;  b. LHC (2/11):  LM 30-40, pLAD 95-99 then 80 and 90, mCFX 90, pRCA occluded, S-dRCA ok, S-D1 ok, S-OM patent with 50, L-LAD ok.  Med Rx recommended.  c.  Myoview (5/13):  Low risk with small fixed inf defect c/w scar, no ischemia, EF 48%.  d.  Echo (6/13):  EF 55-60%, inf HK, Gr 1 DD, MAC;  e. Lexiscan Myoview (10/14):  Low risk, EF 38%, inferobasal infarct, no ischemia.  Marland Kitchen Hx of echocardiogram     Echo (10/14):  EF 55-60%, Gr  1DD, MAC, mild MR, mild LAE.    Past Surgical History  Procedure Laterality Date  . Cardiac catheterization  2003    revdeling severe three-vessel disease  . Coronary artery bypass graft      LIMA to LAD, SVG to diagonal, SVG to RCA, and SVG to circumflex  . Polypectomy      colon  . Renal artery stent  01-28-10    ROS:    Otherwise as stated in the HPI and negative for all other systems.  PHYSICAL EXAM BP 138/78 mmHg  Pulse 74  Ht 5\' 4"  ([id number] m)  Wt 146 lb 9.6 oz (66.497 kg)  BMI 25.15 kg/m2 GENERAL:  Frail appearing NECK:  No jugular venous distention, waveform within normal limits, carotid upstroke brisk and symmetric, no bruits, no thyromegaly LYMPHATICS:  No cervical, inguinal adenopathy LUNGS:  Clear to auscultation bilaterally CHEST:  Well healed sternotomy scar. HEART:  PMI not displaced or sustained,S1 and S2 within normal limits, no S3, no S4, no clicks, no rubs, no murmurs. ABD:  Flat, positive bowel sounds normal in frequency in pitch, no bruits, no rebound, no guarding, no midline pulsatile mass, no hepatomegaly, no splenomegaly EXT:  2 plus pulses throughout, mild edema, no cyanosis no clubbing, contractures right hand   ASSESSMENT AND PLAN  CAD:  He has no evidence of ischemia on his perfusion study.   I will manage this medically.  HTN:  This is being managed in the context of treating his CHF  CARDIOMYOPATHY:  I'm going to stop his Cozaar.) To try to initiate Entresto starting with the lowest dose (24/26 bid).    He will remain on the current dose of diuretic. I have prescribed therapeutic compression stockings.     CONTRACTURES:  Given the orthopedist. However, he wants to hold off on this for now.    DYSLIPIDEMIA:   As below  FATIGUE:  This is perhaps his biggest complaint. His wife was concerned that this could be related to his Crestor. He will stop this medication to see if any of his symptoms improved.

## 2016-03-01 ENCOUNTER — Encounter: Payer: Self-pay | Admitting: Cardiology

## 2016-03-01 ENCOUNTER — Telehealth: Payer: Self-pay

## 2016-03-01 ENCOUNTER — Ambulatory Visit (INDEPENDENT_AMBULATORY_CARE_PROVIDER_SITE_OTHER): Payer: Commercial Managed Care - HMO | Admitting: Cardiology

## 2016-03-01 VITALS — BP 138/78 | HR 74 | Ht 64.0 in | Wt 146.6 lb

## 2016-03-01 DIAGNOSIS — Z79899 Other long term (current) drug therapy: Secondary | ICD-10-CM | POA: Diagnosis not present

## 2016-03-01 MED ORDER — TRAZODONE HCL 50 MG PO TABS: 25.0000 mg | ORAL_TABLET | Freq: Every evening | ORAL | Status: DC | PRN

## 2016-03-01 MED ORDER — SACUBITRIL-VALSARTAN 24-26 MG PO TABS: 1.0000 | ORAL_TABLET | Freq: Two times a day (BID) | ORAL | Status: DC

## 2016-03-01 NOTE — Telephone Encounter (Signed)
error 

## 2016-03-01 NOTE — Patient Instructions (Addendum)
Your physician recommends that you schedule a follow-up appointment in: [place] has recommended you make the following change in your medication: STOP Crestor and Losartan and START Entresto 24/26 mg twice a day  Your physician recommends that you return for lab work in: 2 Weeks BMP

## 2016-03-02 ENCOUNTER — Telehealth: Payer: Self-pay

## 2016-03-02 NOTE — Telephone Encounter (Signed)
Prior auth for [employer] 24-26mg  sent to [facility].

## 2016-03-03 ENCOUNTER — Telehealth: Payer: Self-pay

## 2016-03-03 NOTE — Telephone Encounter (Signed)
Entresto approved by [facility], and is good through 03/03/2018.

## 2016-03-08 ENCOUNTER — Ambulatory Visit: Payer: Commercial Managed Care - HMO | Admitting: Cardiology

## 2016-03-15 DIAGNOSIS — Z79899 Other long term (current) drug therapy: Secondary | ICD-10-CM | POA: Diagnosis not present

## 2016-03-16 LAB — BASIC METABOLIC PANEL
BUN: 23 mg/dL (ref 7–25)
CALCIUM: 8.9 mg/dL (ref 8.6–10.3)
CO2: 28 mmol/L (ref 20–31)
CREATININE: 0.97 mg/dL (ref 0.70–1.11)
Chloride: 101 mmol/L (ref 98–110)
Glucose, Bld: 115 mg/dL — ABNORMAL HIGH (ref 65–99)
Potassium: 4.6 mmol/L (ref 3.5–5.3)
Sodium: 136 mmol/L (ref 135–146)

## 2016-03-25 NOTE — Progress Notes (Signed)
HPI  The patient presents for follow up of CAD, status post CABG, dilated cardiomyopathy, renal artery stenosis, status post right renal artery stent in 01/2010, HTN, HL. Patient was admitted to the hospital in 1/16 with weakness and confusion. He was noted and frequent PVCs. Beta blocker dose was adjusted. Echocardiogram demonstrated worsening of function at that time with an EF of 40%. Follow-up echocardiogram in 8/16 demonstrated worsening LV function with any of 25-30%. Medications were adjusted for his cardiomyopathy conservative therapy was preferred by the patient and he was set up for [employer]. Nuclear stress test in 9/16 demonstrated normal perfusion, low risk study.   At the last visit I stopped his Cozaar and I started Entresto.  He did well with this. The patient denies any new symptoms such as chest discomfort, neck or arm discomfort. There has been no new shortness of breath, PND or orthopnea. There have been no reported palpitations, presyncope or syncope.     Allergies  Allergen Reactions  . Lisinopril Cough    Renal artery stenosis by history; ACE inhibitor  would be relatively contraindicated  . Zithromax [Azithromycin]     02/15/14 N&V  . Niacin Other (See Comments)    Burning sensations in head    Current Outpatient Prescriptions  Medication Sig Dispense Refill  . aspirin 81 MG tablet Take 81 mg by mouth at bedtime.     Marland Kitchen CALCIUM-MAGNESIUM-ZINC PO Take 1 tablet by mouth daily.    . carvedilol (COREG) 6.25 MG tablet Take 1 tablet (6.25 mg total) by mouth 2 (two) times daily. 180 tablet 2  . cholecalciferol (VITAMIN D) 1000 UNITS tablet Take 2,000 Units by mouth daily.    Marland Kitchen ezetimibe (ZETIA) 10 MG tablet Take 0.5 tablets (5 mg total) by mouth daily. 45 tablet 3  . furosemide (LASIX) 20 MG tablet Take 1 tablet (20 mg total) by mouth as directed. Take 20 mg daily for 2 days then change to daily AS NEEDED for increased swelling 30 tablet 3  . Melatonin 3 MG TBDP Take 1  tablet by mouth at bedtime as needed (sleep). For sleep.    . nitroGLYCERIN (NITROSTAT) 0.4 MG SL tablet DISSOLVE 1 TAB UNDER TONGUE AS NEEDED FOR CHEST PAIN, MAY REPEAT IN 5 MINUTES AS DIRECTED. ER IF NO BETTER. 30 tablet 0  . Omega-3 Fatty Acids (FISH OIL) 1000 MG CAPS Take 1 capsule by mouth daily.      . sacubitril-valsartan (ENTRESTO) 24-26 MG Take 1 tablet by mouth 2 (two) times daily. 60 tablet 9  . tamsulosin (FLOMAX) 0.4 MG CAPS capsule take 1 capsule by mouth daily after SUPPER 90 capsule 1  . traZODone (DESYREL) 50 MG tablet Take 0.5-1 tablets (25-50 mg total) by mouth at bedtime as needed for sleep. 30 tablet 0   No current facility-administered medications for this visit.    Past Medical History  Diagnosis Date  . Myocardial infarction ([place]) 2003    hx of   . Hyperlipidemia   . Anxiety   . Fatigue   . Insomnia   . Renal artery stenosis (HCC)     bilateral;  s/p R RA stent 2011  . Hyponatremia 09/2012    Associated with postural hypotension and confusion  . Hypertension   . Coronary artery disease     a. s/p CABG;  b. LHC (2/11):  LM 30-40, pLAD 95-99 then 80 and 90, mCFX 90, pRCA occluded, S-dRCA ok, S-D1 ok, S-OM patent with 50, L-LAD ok.  Med  Rx recommended.  c.  Myoview (5/13):  Low risk with small fixed inf defect c/w scar, no ischemia, EF 48%.  d.  Echo (6/13):  EF 55-60%, inf HK, Gr 1 DD, MAC;  e. Lexiscan Myoview (10/14):  Low risk, EF 38%, inferobasal infarct, no ischemia.  Marland Kitchen Hx of echocardiogram     Echo (10/14):  EF 55-60%, Gr 1DD, MAC, mild MR, mild LAE.    Past Surgical History  Procedure Laterality Date  . Cardiac catheterization  2003    revdeling severe three-vessel disease  . Coronary artery bypass graft      LIMA to LAD, SVG to diagonal, SVG to RCA, and SVG to circumflex  . Polypectomy      colon  . Renal artery stent  01-28-10    ROS:    Otherwise as stated in the HPI and negative for all other systems.  PHYSICAL EXAM BP 134/64 mmHg  Pulse 68   Ht 5\' 4"  (1.626 m)  Wt 147 lb (66.679 kg)  BMI 25.22 kg/m2 GENERAL:  Frail appearing NECK:  No jugular venous distention, waveform within normal limits, carotid upstroke brisk and symmetric, no bruits, no thyromegaly LYMPHATICS:  No cervical, inguinal adenopathy LUNGS:  Clear to auscultation bilaterally CHEST:  Well healed sternotomy scar. HEART:  PMI not displaced or sustained,S1 and S2 within normal limits, no S3, no S4, no clicks, no rubs, no murmurs. ABD:  Flat, positive bowel sounds normal in frequency in pitch, no bruits, no rebound, no guarding, no midline pulsatile mass, no hepatomegaly, no splenomegaly EXT:  2 plus pulses throughout, mild edema, no cyanosis no clubbing, contractures right hand   ASSESSMENT AND PLAN  CAD:  He has no evidence of ischemia on his perfusion study.   I will manage this medically.  HTN:  This is being managed in the context of treating his CHF  CARDIOMYOPATHY:  I'm going to try to increase the Entresto 49/51 mg dose twice a day.    He will remain on the current dose of diuretic.   DYSLIPIDEMIA:   As below  FATIGUE:  I had him hold his Crestor for the last month but he hasn't noticed much improvement in his leg tiredness or fatigue. We'll hold it until I see him back if there is no improvement I will restart it.

## 2016-03-26 ENCOUNTER — Encounter: Payer: Self-pay | Admitting: Cardiology

## 2016-03-26 ENCOUNTER — Ambulatory Visit (INDEPENDENT_AMBULATORY_CARE_PROVIDER_SITE_OTHER): Payer: Commercial Managed Care - HMO | Admitting: Cardiology

## 2016-03-26 VITALS — BP 134/64 | HR 68 | Ht 64.0 in | Wt 147.0 lb

## 2016-03-26 DIAGNOSIS — I5022 Chronic systolic (congestive) heart failure: Secondary | ICD-10-CM

## 2016-03-26 MED ORDER — SACUBITRIL-VALSARTAN 49-51 MG PO TABS: 1.0000 | ORAL_TABLET | Freq: Two times a day (BID) | ORAL | Status: DC

## 2016-03-26 NOTE — Patient Instructions (Addendum)
Your physician recommends that you schedule a follow-up appointment in: 2 Months  Your physician has recommended you make the following change in your medication: Increase Entresto 49/51 mg twice a day

## 2016-04-12 ENCOUNTER — Telehealth: Payer: Self-pay | Admitting: Family

## 2016-04-12 NOTE — Telephone Encounter (Signed)
Advise Cheri I am very sorry but I am unable to take new patient at this time, recommend to continue see the doctors at [facility]

## 2016-04-12 NOTE — Telephone Encounter (Signed)
Daughter advised.

## 2016-04-12 NOTE — Telephone Encounter (Signed)
Caller name: Tobias Alexander  Relation to pt: daughter  Call back number:  [phone]   Reason for call:  Marlowe Alt [id number] patient of yours would like to refer her father to establish care with you. Daughter states she values your opinion. Please advise

## 2016-04-19 ENCOUNTER — Other Ambulatory Visit: Payer: Self-pay

## 2016-04-19 MED ORDER — TRAZODONE HCL 50 MG PO TABS: 25.0000 mg | ORAL_TABLET | Freq: Every evening | ORAL | Status: DC | PRN

## 2016-04-28 ENCOUNTER — Telehealth: Payer: Self-pay | Admitting: Behavioral Health

## 2016-04-28 NOTE — Telephone Encounter (Signed)
Attempted to reach patient on both the home number/wife's mobile number provided for pre-visit call. Per recording, patient is unavailable at this time; unable to leave a message.

## 2016-04-29 ENCOUNTER — Ambulatory Visit (INDEPENDENT_AMBULATORY_CARE_PROVIDER_SITE_OTHER): Payer: Commercial Managed Care - HMO | Admitting: Family Medicine

## 2016-04-29 ENCOUNTER — Encounter: Payer: Self-pay | Admitting: Family Medicine

## 2016-04-29 VITALS — BP 138/62 | HR 82 | Temp 98.1°F | Ht 64.0 in | Wt 145.2 lb

## 2016-04-29 DIAGNOSIS — R413 Other amnesia: Secondary | ICD-10-CM

## 2016-04-29 DIAGNOSIS — R5381 Other malaise: Secondary | ICD-10-CM | POA: Diagnosis not present

## 2016-04-29 NOTE — Progress Notes (Signed)
Pre visit review using our clinic tool,if applicable. No additional management support is needed unless otherwise documented below in the visit note.  

## 2016-04-29 NOTE — Patient Instructions (Signed)
It was very nice to meet you today!  Please come and see me in 3-4 months for a check up We will have you see neurology to evaluate memory loss and decide if any treatment may be helpful for you

## 2016-04-29 NOTE — Progress Notes (Signed)
[place] [facility] [address] [place], [place] [address] [phone]  Date:  04/29/2016   Name:  Victor Waters   DOB:  February 23, 1926   MRN:  [id number]  PCP:  Mauricio Po, FNP    Chief Complaint: Establish Care   History of Present Illness:  Victor Waters is a 82 y.o. very pleasant male patient who presents with the following:  Here today to establish care.  Former pt of Dr. Linna Darner who is now retired.   I will be taking care of him now.    He is from [place], they have lived in [place] for years and years.  They have been very healthy over their lives until more recently.  They have 2 adopted children, 52 and 55, they also have a grand and 3 great grands.  The rest of their family does live nearby- the greatgrands will spend the night.   He was an Pensions consultant.  His wife Mariann Laster does most of the talking- she reports that her husband has some memory loss  He would like a walker with wheels and a seat They need updated HC placards.  He does have some memory loss. They would like to have an evaluation for dementia.  They have started to notice some memory concerns over the last 6 months. He mostly forgets details such as if he took his medication. This did seem to start after he had some episodes of severe headache several months ago.  These have now resolved  They also have noted generalized bilateral leg weakness for about 6 months.     Cardiology is Dr. Percival Spanish He cannot tolerate lasix very well due to renal issues    Patient Active Problem List   Diagnosis Date Noted  . Viral URI with cough 01/31/2016  . Sleep disturbance 01/06/2016  . Loose stools 01/06/2016  . Ventricular ectopy 12/31/2014  . Acute confusional state 12/31/2014  . Weakness 12/23/2014  . Palpitations   . Nocturia 09/20/2013  . Hyponatremia 09/29/2012  . Fatigue 05/09/2012  . PERSONAL HISTORY OTHER DISORDER URINARY SYSTEM 12/22/2010  .  Thrombocytopenia ([place]) 08/04/2010  . LEUKOCYTOPENIA UNSPECIFIED 08/04/2010  . TINNITUS, CHRONIC 07/06/2010  . RENAL ARTERY STENOSIS 11/25/2009  . RENAL INSUFFICIENCY 11/25/2009  . HYPOTENSION 11/01/2009  . DYSPNEA 09/24/2009  . BRADYCARDIA 09/04/2009  . HYPOTENSION-ORTHOSTATIC 09/04/2009  . DIZZINESS 09/04/2009  . HYPERLIPIDEMIA 01/31/2008  . DEPRESSIVE DISORDER 01/23/2008  . Essential hypertension 01/23/2008  . ANXIETY 06/02/2007  . Coronary atherosclerosis 06/02/2007  . INSOMNIA 06/02/2007    Past Medical History  Diagnosis Date  . Myocardial infarction ([place]) 2003    hx of   . Hyperlipidemia   . Anxiety   . Fatigue   . Insomnia   . Renal artery stenosis (HCC)     bilateral;  s/p R RA stent 2011  . Hyponatremia 09/2012    Associated with postural hypotension and confusion  . Hypertension   . Coronary artery disease     a. s/p CABG;  b. LHC (2/11):  LM 30-40, pLAD 95-99 then 80 and 90, mCFX 90, pRCA occluded, S-dRCA ok, S-D1 ok, S-OM patent with 50, L-LAD ok.  Med Rx recommended.  c.  Myoview (5/13):  Low risk with small fixed inf defect c/w scar, no ischemia, EF 48%.  d.  Echo (6/13):  EF 55-60%, inf HK, Gr 1 DD, MAC;  e. Lexiscan Myoview (10/14):  Low risk, EF 38%,  inferobasal infarct, no ischemia.  Marland Kitchen Hx of echocardiogram     Echo (10/14):  EF 55-60%, Gr 1DD, MAC, mild MR, mild LAE.    Past Surgical History  Procedure Laterality Date  . Cardiac catheterization  2003    revdeling severe three-vessel disease  . Coronary artery bypass graft      LIMA to LAD, SVG to diagonal, SVG to RCA, and SVG to circumflex  . Polypectomy      colon  . Renal artery stent  01-28-10    Social History  Substance Use Topics  . Smoking status: Never Smoker   . Smokeless tobacco: Never Used  . Alcohol Use: No    Family History  Problem Relation Age of Onset  . Diabetes Mother   . Stroke Mother   . Heart attack Father 54    died  . Colon cancer Other     paternal grandfather     Allergies  Allergen Reactions  . Lisinopril Cough    Renal artery stenosis by history; ACE inhibitor  would be relatively contraindicated  . Zithromax [Azithromycin]     02/15/14 N&V  . Niacin Other (See Comments)    Burning sensations in head    Medication list has been reviewed and updated.  Current Outpatient Prescriptions on File Prior to Visit  Medication Sig Dispense Refill  . aspirin 81 MG tablet Take 81 mg by mouth at bedtime.     Marland Kitchen CALCIUM-MAGNESIUM-ZINC PO Take 1 tablet by mouth daily.    . carvedilol (COREG) 6.25 MG tablet Take 1 tablet (6.25 mg total) by mouth 2 (two) times daily. 180 tablet 2  . cholecalciferol (VITAMIN D) 1000 UNITS tablet Take 2,000 Units by mouth daily.    Marland Kitchen ezetimibe (ZETIA) 10 MG tablet Take 0.5 tablets (5 mg total) by mouth daily. 45 tablet 3  . Melatonin 3 MG TBDP Take 1 tablet by mouth at bedtime as needed (sleep). Reported on 04/29/2016    . nitroGLYCERIN (NITROSTAT) 0.4 MG SL tablet DISSOLVE 1 TAB UNDER TONGUE AS NEEDED FOR CHEST PAIN, MAY REPEAT IN 5 MINUTES AS DIRECTED. ER IF NO BETTER. 30 tablet 0  . Omega-3 Fatty Acids (FISH OIL) 1000 MG CAPS Take 1 capsule by mouth daily.      . sacubitril-valsartan (ENTRESTO) 49-51 MG Take 1 tablet by mouth 2 (two) times daily. 60 tablet 9  . tamsulosin (FLOMAX) 0.4 MG CAPS capsule take 1 capsule by mouth daily after SUPPER 90 capsule 1  . traZODone (DESYREL) 50 MG tablet Take 0.5-1 tablets (25-50 mg total) by mouth at bedtime as needed for sleep. 30 tablet 0  . furosemide (LASIX) 20 MG tablet Take 1 tablet (20 mg total) by mouth as directed. Take 20 mg daily for 2 days then change to daily AS NEEDED for increased swelling (Patient not taking: Reported on 04/29/2016) 30 tablet 3   No current facility-administered medications on file prior to visit.    Review of Systems:  As per HPI- otherwise negative.   Physical Examination: Filed Vitals:   04/29/16 1320  BP: 138/62  Pulse: 82  Temp: 98.1 F  (36.7 C)   Filed Vitals:   04/29/16 1320  Height: 5\' 4"  (1.626 m)  Weight: 145 lb 3.2 oz (65.862 kg)   Body mass index is 24.91 kg/(m^2). Ideal Body Weight: Weight in (lb) to have BMI = 25: 145.3  GEN: WDWN, NAD, Non-toxic, A & O x 3, looks well, neatly groomed  HEENT: Atraumatic, Normocephalic. Neck supple.  No masses, No LAD. Ears and Nose: No external deformity. CV: RRR- sinus with frequent ectopic beats, No M/G/R. No JVD. No thrill. No extra heart sounds. PULM: CTA B, no wheezes, crackles, rhonchi. No retractions. No resp. distress. No accessory muscle use. EXTR: No c/c/e NEURO Normal gait for age PSYCH: Normally interactive. Conversant. Not depressed or anxious appearing.  Calm demeanor.    Assessment and Plan: Memory loss - Plan: Ambulatory referral to Neurology  Physical debility - Plan: Walker rolling  Here today to establish care with me a new provider Completed HC placard for him today His wife has noted some difficutly with short term memory, leg weakness and some unusual HA a few months ago- will refer to neurology for evaluation.  They will come and see me in 3-4 months for a recheck   Signed Lamar Blinks, MD

## 2016-05-04 ENCOUNTER — Other Ambulatory Visit: Payer: Self-pay | Admitting: Cardiology

## 2016-05-04 NOTE — Telephone Encounter (Signed)
REFILL 

## 2016-05-25 ENCOUNTER — Telehealth: Payer: Self-pay | Admitting: Family Medicine

## 2016-05-25 ENCOUNTER — Telehealth: Payer: Self-pay | Admitting: Cardiology

## 2016-05-25 ENCOUNTER — Ambulatory Visit (INDEPENDENT_AMBULATORY_CARE_PROVIDER_SITE_OTHER): Payer: Commercial Managed Care - HMO | Admitting: Cardiology

## 2016-05-25 ENCOUNTER — Encounter: Payer: Self-pay | Admitting: Cardiology

## 2016-05-25 VITALS — BP 106/61 | HR 81 | Ht 64.0 in | Wt 147.4 lb

## 2016-05-25 DIAGNOSIS — Z79899 Other long term (current) drug therapy: Secondary | ICD-10-CM

## 2016-05-25 LAB — BASIC METABOLIC PANEL
BUN: 21 mg/dL (ref 7–25)
CHLORIDE: 103 mmol/L (ref 98–110)
CO2: 25 mmol/L (ref 20–31)
CREATININE: 0.93 mg/dL (ref 0.70–1.11)
Calcium: 9.3 mg/dL (ref 8.6–10.3)
Glucose, Bld: 102 mg/dL — ABNORMAL HIGH (ref 65–99)
Potassium: 4.9 mmol/L (ref 3.5–5.3)
Sodium: 135 mmol/L (ref 135–146)

## 2016-05-25 NOTE — Telephone Encounter (Signed)
New message       Calling to get directions on entresto medication

## 2016-05-25 NOTE — Progress Notes (Signed)
HPI  The patient presents for follow up of CAD, status post CABG, dilated cardiomyopathy, renal artery stenosis, status post right renal artery stent in 01/2010, HTN, HL. Patient was admitted to the hospital in 1/16 with weakness and confusion. He was noted and frequent PVCs. Beta blocker dose was adjusted. Echocardiogram demonstrated worsening of function at that time with an EF of 40%. Follow-up echocardiogram in 8/16 demonstrated worsening LV function with any of 25-30%. Medications were adjusted for his cardiomyopathy conservative therapy was preferred by the patient and he was set up for [employer]. Nuclear stress test in 9/16 demonstrated normal perfusion, low risk study.     At the last visit I increased the Entresto 49/51 mg dose twice a day.  He did well with this. The patient denies any new symptoms such as chest discomfort, neck or arm discomfort. There has been no new shortness of breath, PND or orthopnea. There have been no reported palpitations, presyncope or syncope.     Allergies  Allergen Reactions  . Lisinopril Cough    Renal artery stenosis by history; ACE inhibitor  would be relatively contraindicated  . Zithromax [Azithromycin]     02/15/14 N&V  . Niacin Other (See Comments)    Burning sensations in head    Current Outpatient Prescriptions  Medication Sig Dispense Refill  . aspirin 81 MG tablet Take 81 mg by mouth at bedtime.     Marland Kitchen CALCIUM-MAGNESIUM-ZINC PO Take 1 tablet by mouth daily.    . carvedilol (COREG) 6.25 MG tablet TAKE 1 TABLET TWICE DAILY 180 tablet 0  . cholecalciferol (VITAMIN D) 1000 UNITS tablet Take 2,000 Units by mouth daily.    Marland Kitchen ezetimibe (ZETIA) 10 MG tablet Take 0.5 tablets (5 mg total) by mouth daily. 45 tablet 3  . furosemide (LASIX) 20 MG tablet Take 1 tablet (20 mg total) by mouth as directed. Take 20 mg daily for 2 days then change to daily AS NEEDED for increased swelling 30 tablet 3  . Melatonin 3 MG TBDP Take 1 tablet by mouth at  bedtime as needed (sleep). Reported on 04/29/2016    . nitroGLYCERIN (NITROSTAT) 0.4 MG SL tablet DISSOLVE 1 TAB UNDER TONGUE AS NEEDED FOR CHEST PAIN, MAY REPEAT IN 5 MINUTES AS DIRECTED. ER IF NO BETTER. 30 tablet 0  . Omega-3 Fatty Acids (FISH OIL) 1000 MG CAPS Take 1 capsule by mouth daily.      . sacubitril-valsartan (ENTRESTO) 49-51 MG Take 1 tablet by mouth 2 (two) times daily. 60 tablet 9  . tamsulosin (FLOMAX) 0.4 MG CAPS capsule take 1 capsule by mouth daily after SUPPER 90 capsule 1  . traZODone (DESYREL) 50 MG tablet Take 0.5-1 tablets (25-50 mg total) by mouth at bedtime as needed for sleep. 30 tablet 0   No current facility-administered medications for this visit.    Past Medical History  Diagnosis Date  . Myocardial infarction ([place]) 2003    hx of   . Hyperlipidemia   . Anxiety   . Fatigue   . Insomnia   . Renal artery stenosis ([facility])     bilateral;  s/p R RA stent 2011  . Hyponatremia 09/2012    Associated with postural hypotension and confusion  . Hypertension   . Coronary artery disease     a. s/p CABG;  b. LHC (2/11):  LM 30-40, pLAD 95-99 then 80 and 90, mCFX 90, pRCA occluded, S-dRCA ok, S-D1 ok, S-OM patent with 50, L-LAD ok.  Med Rx recommended.  c.  Myoview (5/13):  Low risk with small fixed inf defect c/w scar, no ischemia, EF 48%.  d.  Echo (6/13):  EF 55-60%, inf HK, Gr 1 DD, MAC;  e. Lexiscan Myoview (10/14):  Low risk, EF 38%, inferobasal infarct, no ischemia.  Marland Kitchen Hx of echocardiogram     Echo (10/14):  EF 55-60%, Gr 1DD, MAC, mild MR, mild LAE.    Past Surgical History  Procedure Laterality Date  . Cardiac catheterization  2003    revdeling severe three-vessel disease  . Coronary artery bypass graft      LIMA to LAD, SVG to diagonal, SVG to RCA, and SVG to circumflex  . Polypectomy      colon  . Renal artery stent  01-28-10    ROS:    Otherwise as stated in the HPI and negative for all other systems.  PHYSICAL EXAM BP 106/61 mmHg  Pulse 81  Ht 5'  4" (1.626 m)  Wt 147 lb 6.4 oz (66.86 kg)  BMI 25.29 kg/m2 GENERAL:  Frail appearing NECK:  No jugular venous distention, waveform within normal limits, carotid upstroke brisk and symmetric, no bruits, no thyromegaly LYMPHATICS:  No cervical, inguinal adenopathy LUNGS:  Clear to auscultation bilaterally CHEST:  Well healed sternotomy scar. HEART:  PMI not displaced or sustained,S1 and S2 within normal limits, no S3, no S4, no clicks, no rubs, no murmurs. ABD:  Flat, positive bowel sounds normal in frequency in pitch, no bruits, no rebound, no guarding, no midline pulsatile mass, no hepatomegaly, no splenomegaly EXT:  2 plus pulses throughout, mild edema, no cyanosis no clubbing, contractures right hand   ASSESSMENT AND PLAN  CAD:  He has no evidence of ischemia on his perfusion study.   I will manage this medically.  HTN:  This is being managed in the context of treating his CHF  CARDIOMYOPATHY:  Given the low BP I will make no change to his meds.  Continue with current therapy.    DYSLIPIDEMIA:   He remains off of statin secondary to leg fatigue.  Continue Zetia.

## 2016-05-25 NOTE — Patient Instructions (Signed)
Medication Instructions:  Continue current medications  Labwork: BMP  Testing/Procedures: NONE  Follow-Up: Your physician wants you to follow-up in: 6 Months You will receive a reminder letter in the mail two months in advance. If you don't receive a letter, please call our office to schedule the follow-up appointment.   Any Other Special Instructions Will Be Listed Below (If Applicable).  If you need a refill on your cardiac medications before your next appointment, please call your pharmacy.   

## 2016-05-25 NOTE — Telephone Encounter (Signed)
-----   Message from Minus Breeding, MD sent at 05/25/2016  8:42 AM EDT ----- Hi,  This patient was wondering about a referral for memory to one of the neurologists.  They are waiting to hear back from your office.  Thanks.

## 2016-05-25 NOTE — Telephone Encounter (Signed)
Spoke with [facility] and reviewed Entresto directions Directions at pharmacy are same as chart

## 2016-05-25 NOTE — Telephone Encounter (Signed)
Anderson Malta, can you look into this?  It looks like we were not able to contact the pt for his referral,  Thanks! [place]

## 2016-05-26 NOTE — Telephone Encounter (Signed)
Message sent to [facility] with [facility] to call patient and schedule

## 2016-06-17 ENCOUNTER — Telehealth: Payer: Self-pay | Admitting: Family Medicine

## 2016-06-18 NOTE — Telephone Encounter (Signed)
[id number] ° °

## 2016-07-07 ENCOUNTER — Other Ambulatory Visit: Payer: Self-pay | Admitting: Internal Medicine

## 2016-07-07 NOTE — Telephone Encounter (Signed)
Routing to patient's new pcp to handle 

## 2016-08-10 ENCOUNTER — Encounter: Payer: Self-pay | Admitting: Neurology

## 2016-08-10 ENCOUNTER — Other Ambulatory Visit: Payer: Commercial Managed Care - HMO

## 2016-08-10 ENCOUNTER — Ambulatory Visit (INDEPENDENT_AMBULATORY_CARE_PROVIDER_SITE_OTHER): Payer: Commercial Managed Care - HMO | Admitting: Neurology

## 2016-08-10 VITALS — BP 120/58 | HR 83 | Temp 97.7°F | Ht 63.0 in | Wt 146.0 lb

## 2016-08-10 DIAGNOSIS — F03A0 Unspecified dementia, mild, without behavioral disturbance, psychotic disturbance, mood disturbance, and anxiety: Secondary | ICD-10-CM

## 2016-08-10 DIAGNOSIS — F0390 Unspecified dementia without behavioral disturbance: Secondary | ICD-10-CM | POA: Diagnosis not present

## 2016-08-10 DIAGNOSIS — E785 Hyperlipidemia, unspecified: Secondary | ICD-10-CM | POA: Diagnosis not present

## 2016-08-10 DIAGNOSIS — I10 Essential (primary) hypertension: Secondary | ICD-10-CM

## 2016-08-10 NOTE — Patient Instructions (Signed)
1. Schedule MRI brain without contrast 2. Bloodwork for TSH, B12, RPR 3. Control of BP, cholesterol, as well as physical exercise and brain stimulation exercises are important for brain health 4. Follow-up in 6 months, call for any changes

## 2016-08-10 NOTE — Progress Notes (Signed)
NEUROLOGY CONSULTATION NOTE  Victor Waters MRN: [id number] DOB: March 25, 1926  Referring provider: Dr. Lamar Blinks Primary care provider: Dr. Lamar Blinks  Reason for consult:  Memory loss  Dear Dr Lorelei Pont:  Thank you for your kind referral of Victor Waters for consultation of the above symptoms. Although his history is well known to you, please allow me to reiterate it for the purpose of our medical record. The patient was accompanied to the clinic by his wife and daughter who also provide collateral information. Records and images were personally reviewed where available.  HISTORY OF PRESENT ILLNESS: This is a pleasant 82 year old right-handed man with a history of hypertension, hyperlipidemia, renal artery stenosis s/p stent, CAD s/p CABG, presenting for evaluation of worsening memory. His family started noticing memory changes over the past year. They have noticed short-term memory issues, he misplaces things frequently, he forgets dates and conversations. He likes to read but forgets the previous paragraphs. His wife had to take over bill payments in October 2016 because he was overpaying and had several checks bounce. His wife has to remind him to take his medications and has been laying them on the table for the past few months because he would double up his dose, forgetting if he took his pill already. He got lost driving 3 months ago, he was on [address] and forgot where he was and called his wife. 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 His daughter has noticed some processing difficulties, he was going to pick her up today at work and kept repeating the address as our office's address. 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 No difficulties with ADLs. No family history of dementia. He denies any head injuries or alcohol intake.  He has infrequent headaches, occasional dizziness upon standing,  urinary hesitancy. He has had weakness in both legs, describing it as lack of coordination between his brain and limbs. He denies any incontinence/constipation. He has chronic low back pain. He fell a few months ago and his wife feels he drags his legs. He denies any diplopia, dysarthria/dysphagia, neck pain, focal numbness/tingling, anosmia, or tremors.   Laboratory Data: Lab Results  Component Value Date   WBC 7.2 02/17/2016   HGB 14.2 02/17/2016   HCT 42.2 02/17/2016   MCV 91.3 02/17/2016   PLT 220 02/17/2016     Chemistry      Component Value Date/Time   NA 135 05/25/2016 0925   K 4.9 05/25/2016 0925   CL 103 05/25/2016 0925   CO2 25 05/25/2016 0925   BUN 21 05/25/2016 0925   CREATININE 0.93 05/25/2016 0925      Component Value Date/Time   CALCIUM 9.3 05/25/2016 0925   ALKPHOS 49 12/23/2014 1251   AST 22 12/23/2014 1251   ALT 23 12/23/2014 1251   BILITOT 1.3 (H) 12/23/2014 1251     Lab Results  Component Value Date   TSH 1.82 02/17/2016     PAST MEDICAL HISTORY: Past Medical History:  Diagnosis Date  . Anxiety   . Coronary artery disease    a. s/p CABG;  b. LHC (2/11):  LM 30-40, pLAD 95-99 then 80 and 90, mCFX 90, pRCA occluded, S-dRCA ok, S-D1 ok, S-OM patent with 50, L-LAD ok.  Med Rx recommended.  c.  Myoview (5/13):  Low risk with small fixed inf defect c/w scar, no ischemia, EF 48%.  d.  Echo (6/13):  EF 55-60%, inf HK, Gr 1 DD, MAC;  e. Lexiscan Myoview (10/14):  Low risk, EF 38%, inferobasal infarct, no ischemia.  . Fatigue   . Hx of echocardiogram    Echo (10/14):  EF 55-60%, Gr 1DD, MAC, mild MR, mild LAE.  Marland Kitchen Hyperlipidemia   . Hypertension   . Hyponatremia 09/2012   Associated with postural hypotension and confusion  . Insomnia   . Myocardial infarction ([place]) 2003   hx of   . Renal artery stenosis ([facility])    bilateral;  s/p R RA stent 2011    PAST SURGICAL HISTORY: Past Surgical History:  Procedure Laterality Date  . CARDIAC CATHETERIZATION  2003    revdeling severe three-vessel disease  . CORONARY ARTERY BYPASS GRAFT     LIMA to LAD, SVG to diagonal, SVG to RCA, and SVG to circumflex  . POLYPECTOMY     colon  . RENAL ARTERY STENT  01-28-10    MEDICATIONS: Current Outpatient Prescriptions on File Prior to Visit  Medication Sig Dispense Refill  . aspirin 81 MG tablet Take 81 mg by mouth at bedtime.     Marland Kitchen CALCIUM-MAGNESIUM-ZINC PO Take 1 tablet by mouth daily.    . carvedilol (COREG) 6.25 MG tablet TAKE 1 TABLET TWICE DAILY 180 tablet 0  . cholecalciferol (VITAMIN D) 1000 UNITS tablet Take 2,000 Units by mouth daily.    Marland Kitchen ezetimibe (ZETIA) 10 MG tablet Take 0.5 tablets (5 mg total) by mouth daily. 45 tablet 3  . furosemide (LASIX) 20 MG tablet Take 1 tablet (20 mg total) by mouth as directed. Take 20 mg daily for 2 days then change to daily AS NEEDED for increased swelling 30 tablet 3  . Melatonin 3 MG TBDP Take 1 tablet by mouth at bedtime as needed (sleep). Reported on 04/29/2016    . nitroGLYCERIN (NITROSTAT) 0.4 MG SL tablet DISSOLVE 1 TAB UNDER TONGUE AS NEEDED FOR CHEST PAIN, MAY REPEAT IN 5 MINUTES AS DIRECTED. ER IF NO BETTER. 30 tablet 0  . Omega-3 Fatty Acids (FISH OIL) 1000 MG CAPS Take 1 capsule by mouth daily.      . sacubitril-valsartan (ENTRESTO) 49-51 MG Take 1 tablet by mouth 2 (two) times daily. 60 tablet 9  . tamsulosin (FLOMAX) 0.4 MG CAPS capsule TAKE 1 CAPSULE BY MOUTH DAILY AFTER SUPPER 90 capsule 1  . traZODone (DESYREL) 50 MG tablet Take 0.5-1 tablets (25-50 mg total) by mouth at bedtime as needed for sleep. 30 tablet 0   No current facility-administered medications on file prior to visit.     ALLERGIES: Allergies  Allergen Reactions  . Lisinopril Cough    Renal artery stenosis by history; ACE inhibitor  would be relatively contraindicated  . Zithromax [Azithromycin]     02/15/14 N&V  . Niacin Other (See Comments)    Burning sensations in head    FAMILY HISTORY: Family History  Problem Relation Age  of Onset  . Diabetes Mother   . Stroke Mother   . Heart attack Father 54    died  . Colon cancer Other     paternal grandfather    SOCIAL HISTORY: Social History   Social History  . Marital status: Married    Spouse name: N/A  . Number of children: N/A  . Years of education: N/A   Occupational History  . Not on file.   Social History Main Topics  . Smoking status: Never Smoker  . Smokeless tobacco: Never Used  . Alcohol  use No  . Drug use: No  . Sexual activity: Not on file   Other Topics Concern  . Not on file   Social History Narrative   Married   Lives with wife and 32year old ggd    REVIEW OF SYSTEMS: Constitutional: No fevers, chills, or sweats, no generalized fatigue, change in appetite Eyes: No visual changes, double vision, eye pain Ear, nose and throat: No hearing loss, ear pain, nasal congestion, sore throat Cardiovascular: No chest pain, palpitations Respiratory:  No shortness of breath at rest or with exertion, wheezes GastrointestinaI: No nausea, vomiting, diarrhea, abdominal pain, fecal incontinence Genitourinary:  No dysuria, urinary retention or frequency Musculoskeletal:  No neck pain,+ back pain Integumentary: No rash, pruritus, skin lesions Neurological: as above Psychiatric: No depression, insomnia, anxiety Endocrine: No palpitations, fatigue, diaphoresis, mood swings, change in appetite, change in weight, increased thirst Hematologic/Lymphatic:  No anemia, purpura, petechiae. Allergic/Immunologic: no itchy/runny eyes, nasal congestion, recent allergic reactions, rashes  PHYSICAL EXAM: Vitals:   08/10/16 1308  BP: (!) 120/58  Pulse: 83  Temp: 97.7 F (36.5 C)   General: No acute distress Head:  Normocephalic/atraumatic Eyes: Fundoscopic exam shows bilateral sharp discs, no vessel changes, exudates, or hemorrhages Neck: supple, no paraspinal tenderness, full range of motion Back: No paraspinal tenderness Heart: regular rate and  rhythm Lungs: Clear to auscultation bilaterally. Vascular: No carotid bruits. Skin/Extremities: No rash, no edema Neurological Exam: Mental status: alert and oriented to person, place, and time, no dysarthria or aphasia, Fund of knowledge is appropriate.  Recent and remote memory are intact.  Attention and concentration are normal.    Able to name objects and repeat phrases. CDT 4/5 MMSE - Mini Mental State Exam 08/10/2016  Orientation to time 2  Orientation to Place 5  Registration 3  Attention/ Calculation 4  Recall 1  Language- name 2 objects 2  Language- repeat 1  Language- follow 3 step command 3  Language- read & follow direction 1  Write a sentence 1  Copy design 1  Total score 24   Cranial nerves: CN I: not tested CN II: pupils equal, round and reactive to light, visual fields intact, fundi unremarkable. CN III, IV, VI:  full range of motion, no nystagmus, no ptosis CN V: facial sensation intact CN VII: upper and lower face symmetric CN VIII: hearing intact to finger rub CN IX, X: gag intact, uvula midline CN XI: sternocleidomastoid and trapezius muscles intact CN XII: tongue midline Bulk & Tone: normal, no cogwheeling, no fasciculations. Motor: 5/5 throughout with no pronator drift. Sensation: decreased cold on left UE, otherwise intact to light touch, pin, vibration and joint position sense.  No extinction to double simultaneous stimulation.  Romberg test negative Deep Tendon Reflexes: +2 throughout except for absent ankle jerks bilaterally, no ankle clonus Plantar responses: downgoing bilaterally Cerebellar: no incoordination on finger to nose, heel to shin. No dysdiadochokinesia Gait: stooped posture, slow and cautious with some decreased arm swing on right, unable to tandem walk Tremor: none  IMPRESSION: This is a pleasant 82 year old right-handed man with a history of hypertension, hyperlipidemia, renal artery stenosis s/p stent, CAD s/p CABG, presenting for  evaluation of worsening memory. His neurological exam shows subjective decreased sensation on the left arm, stooped posture with decreased arm swing, but no other Parkinsonians signs. MMSE today is 24/30, indicating mild cognitive impairment, however by history suggestive of mild dementia. We discussed different causes of memory loss, check TSH, B12, RPR. MRI brain without contrast  will be ordered to assess for underlying structural abnormality and assess vascular load. His wife later on asked about effect of "nerves" or anxiety, we discussed how mood can affect memory, continue to monitor. Agree with always driving with company. We discussed the option of starting medication for dementia, would be hesitant to start Aricept due to report of bradycardia, Namenda may be an option, side effects and expectations from the medication were discussed, they would like to hold off for now and re-evaluate in 6 months. We discussed the importance of control of vascular risk factors, physical exercise, and brain stimulation exercises for brain health. He will follow-up in 6 months.  Thank you for allowing me to participate in the care of this patient. Please do not hesitate to call for any questions or concerns.   Ellouise Newer, M.D.  CC: Dr. Lorelei Pont

## 2016-08-11 ENCOUNTER — Encounter: Payer: Self-pay | Admitting: Neurology

## 2016-08-20 ENCOUNTER — Inpatient Hospital Stay: Admission: RE | Admit: 2016-08-20 | Payer: Commercial Managed Care - HMO | Source: Ambulatory Visit

## 2016-08-31 ENCOUNTER — Encounter (HOSPITAL_COMMUNITY): Payer: Self-pay | Admitting: Emergency Medicine

## 2016-08-31 ENCOUNTER — Observation Stay (HOSPITAL_COMMUNITY): Payer: Commercial Managed Care - HMO

## 2016-08-31 ENCOUNTER — Observation Stay (HOSPITAL_COMMUNITY)
Admission: EM | Admit: 2016-08-31 | Discharge: 2016-09-01 | Disposition: A | Discharge status: Home / Self Care | Payer: Commercial Managed Care - HMO | Attending: Family Medicine | Admitting: Family Medicine

## 2016-08-31 ENCOUNTER — Emergency Department (HOSPITAL_COMMUNITY): Payer: Commercial Managed Care - HMO

## 2016-08-31 DIAGNOSIS — Y9301 Activity, walking, marching and hiking: Secondary | ICD-10-CM | POA: Insufficient documentation

## 2016-08-31 DIAGNOSIS — Z8673 Personal history of transient ischemic attack (TIA), and cerebral infarction without residual deficits: Secondary | ICD-10-CM

## 2016-08-31 DIAGNOSIS — R55 Syncope and collapse: Secondary | ICD-10-CM | POA: Diagnosis present

## 2016-08-31 DIAGNOSIS — Z951 Presence of aortocoronary bypass graft: Secondary | ICD-10-CM | POA: Insufficient documentation

## 2016-08-31 DIAGNOSIS — W1830XA Fall on same level, unspecified, initial encounter: Secondary | ICD-10-CM | POA: Insufficient documentation

## 2016-08-31 DIAGNOSIS — I5022 Chronic systolic (congestive) heart failure: Secondary | ICD-10-CM | POA: Diagnosis not present

## 2016-08-31 DIAGNOSIS — S0093XA Contusion of unspecified part of head, initial encounter: Secondary | ICD-10-CM | POA: Diagnosis present

## 2016-08-31 DIAGNOSIS — I252 Old myocardial infarction: Secondary | ICD-10-CM | POA: Diagnosis not present

## 2016-08-31 DIAGNOSIS — R0600 Dyspnea, unspecified: Secondary | ICD-10-CM | POA: Diagnosis not present

## 2016-08-31 DIAGNOSIS — M542 Cervicalgia: Secondary | ICD-10-CM | POA: Diagnosis not present

## 2016-08-31 DIAGNOSIS — Y929 Unspecified place or not applicable: Secondary | ICD-10-CM | POA: Insufficient documentation

## 2016-08-31 DIAGNOSIS — S0081XA Abrasion of other part of head, initial encounter: Principal | ICD-10-CM | POA: Insufficient documentation

## 2016-08-31 DIAGNOSIS — S0993XA Unspecified injury of face, initial encounter: Secondary | ICD-10-CM

## 2016-08-31 DIAGNOSIS — Z79899 Other long term (current) drug therapy: Secondary | ICD-10-CM | POA: Insufficient documentation

## 2016-08-31 DIAGNOSIS — Z7982 Long term (current) use of aspirin: Secondary | ICD-10-CM | POA: Insufficient documentation

## 2016-08-31 DIAGNOSIS — Y999 Unspecified external cause status: Secondary | ICD-10-CM | POA: Insufficient documentation

## 2016-08-31 DIAGNOSIS — S199XXA Unspecified injury of neck, initial encounter: Secondary | ICD-10-CM | POA: Diagnosis not present

## 2016-08-31 DIAGNOSIS — S299XXA Unspecified injury of thorax, initial encounter: Secondary | ICD-10-CM | POA: Diagnosis not present

## 2016-08-31 DIAGNOSIS — I2510 Atherosclerotic heart disease of native coronary artery without angina pectoris: Secondary | ICD-10-CM | POA: Insufficient documentation

## 2016-08-31 DIAGNOSIS — R404 Transient alteration of awareness: Secondary | ICD-10-CM | POA: Diagnosis not present

## 2016-08-31 DIAGNOSIS — S0990XA Unspecified injury of head, initial encounter: Secondary | ICD-10-CM | POA: Diagnosis not present

## 2016-08-31 DIAGNOSIS — I10 Essential (primary) hypertension: Secondary | ICD-10-CM | POA: Insufficient documentation

## 2016-08-31 HISTORY — DX: Syncope and collapse: R55

## 2016-08-31 HISTORY — DX: Unspecified systolic (congestive) heart failure: I50.20

## 2016-08-31 LAB — CBC WITH DIFFERENTIAL/PLATELET
BASOS ABS: 0 10*3/uL (ref 0.0–0.1)
BASOS PCT: 0 %
Eosinophils Absolute: 0.1 10*3/uL (ref 0.0–0.7)
Eosinophils Relative: 3 %
HEMATOCRIT: 40.9 % (ref 39.0–52.0)
HEMOGLOBIN: 13.7 g/dL (ref 13.0–17.0)
LYMPHS PCT: 8 %
Lymphs Abs: 0.4 10*3/uL — ABNORMAL LOW (ref 0.7–4.0)
MCH: 30.9 pg (ref 26.0–34.0)
MCHC: 33.5 g/dL (ref 30.0–36.0)
MCV: 92.3 fL (ref 78.0–100.0)
MONO ABS: 0.5 10*3/uL (ref 0.1–1.0)
Monocytes Relative: 10 %
NEUTROS ABS: 4.3 10*3/uL (ref 1.7–7.7)
NEUTROS PCT: 79 %
Platelets: 110 10*3/uL — ABNORMAL LOW (ref 150–400)
RBC: 4.43 MIL/uL (ref 4.22–5.81)
RDW: 13.8 % (ref 11.5–15.5)
WBC: 5.5 10*3/uL (ref 4.0–10.5)

## 2016-08-31 LAB — COMPREHENSIVE METABOLIC PANEL
ALBUMIN: 3.5 g/dL (ref 3.5–5.0)
ALK PHOS: 39 U/L (ref 38–126)
ALT: 22 U/L (ref 17–63)
AST: 27 U/L (ref 15–41)
Anion gap: 7 (ref 5–15)
BILIRUBIN TOTAL: 1 mg/dL (ref 0.3–1.2)
BUN: 19 mg/dL (ref 6–20)
CALCIUM: 9.1 mg/dL (ref 8.9–10.3)
CO2: 24 mmol/L (ref 22–32)
CREATININE: 1.03 mg/dL (ref 0.61–1.24)
Chloride: 105 mmol/L (ref 101–111)
GFR calc Af Amer: >60 mL/min (ref >60)
GLUCOSE: 183 mg/dL — AB (ref 65–99)
POTASSIUM: 3.9 mmol/L (ref 3.5–5.1)
Sodium: 136 mmol/L (ref 135–145)
TOTAL PROTEIN: 5.6 g/dL — AB (ref 6.5–8.1)

## 2016-08-31 LAB — TSH: TSH: 1.43 u[IU]/mL (ref 0.350–4.500)

## 2016-08-31 LAB — CREATININE, SERUM
Creatinine, Ser: 1.05 mg/dL (ref 0.61–1.24)
GFR calc Af Amer: >60 mL/min (ref >60)
GFR calc non Af Amer: >60 mL/min (ref >60)

## 2016-08-31 LAB — I-STAT TROPONIN, ED: TROPONIN I, POC: 0 ng/mL (ref 0.00–0.08)

## 2016-08-31 LAB — CBC
HCT: 41.8 % (ref 39.0–52.0)
HEMOGLOBIN: 13.8 g/dL (ref 13.0–17.0)
MCH: 30.2 pg (ref 26.0–34.0)
MCHC: 33 g/dL (ref 30.0–36.0)
MCV: 91.5 fL (ref 78.0–100.0)
Platelets: 119 10*3/uL — ABNORMAL LOW (ref 150–400)
RBC: 4.57 MIL/uL (ref 4.22–5.81)
RDW: 14 % (ref 11.5–15.5)
WBC: 4.1 10*3/uL (ref 4.0–10.5)

## 2016-08-31 LAB — URINALYSIS, ROUTINE W REFLEX MICROSCOPIC
BILIRUBIN URINE: NEGATIVE
GLUCOSE, UA: NEGATIVE mg/dL
HGB URINE DIPSTICK: NEGATIVE
Ketones, ur: NEGATIVE mg/dL
Leukocytes, UA: NEGATIVE
Nitrite: NEGATIVE
PROTEIN: NEGATIVE mg/dL
Specific Gravity, Urine: 1.01 (ref 1.005–1.030)
pH: 6 (ref 5.0–8.0)

## 2016-08-31 LAB — BRAIN NATRIURETIC PEPTIDE: B Natriuretic Peptide: 114.3 pg/mL — ABNORMAL HIGH (ref 0.0–100.0)

## 2016-08-31 LAB — MAGNESIUM: Magnesium: 2.2 mg/dL (ref 1.7–2.4)

## 2016-08-31 LAB — GLUCOSE, CAPILLARY: GLUCOSE-CAPILLARY: 97 mg/dL (ref 65–99)

## 2016-08-31 MED ORDER — ASPIRIN 81 MG PO CHEW
324.0000 mg | CHEWABLE_TABLET | Freq: Once | ORAL | Status: AC
  Administered 2016-08-31: 324 mg via ORAL
  Filled 2016-08-31: qty 4

## 2016-08-31 MED ORDER — TAMSULOSIN HCL 0.4 MG PO CAPS
0.4000 mg | ORAL_CAPSULE | Freq: Every day | ORAL | Status: DC
  Administered 2016-08-31: 0.4 mg via ORAL
  Filled 2016-08-31: qty 1

## 2016-08-31 MED ORDER — ENOXAPARIN SODIUM 40 MG/0.4ML ~~LOC~~ SOLN
40.0000 mg | SUBCUTANEOUS | Status: DC
  Administered 2016-08-31: 40 mg via SUBCUTANEOUS
  Filled 2016-08-31: qty 0.4

## 2016-08-31 MED ORDER — ASPIRIN EC 81 MG PO TBEC
81.0000 mg | DELAYED_RELEASE_TABLET | Freq: Every day | ORAL | Status: DC
  Administered 2016-09-01: 81 mg via ORAL
  Filled 2016-08-31: qty 1

## 2016-08-31 MED ORDER — EZETIMIBE 10 MG PO TABS
5.0000 mg | ORAL_TABLET | Freq: Every day | ORAL | Status: DC
  Administered 2016-08-31 – 2016-09-01 (×2): 5 mg via ORAL
  Filled 2016-08-31 (×2): qty 1

## 2016-08-31 MED ORDER — SODIUM CHLORIDE 0.9% FLUSH: 3.0000 mL | Freq: Two times a day (BID) | INTRAVENOUS | Status: DC

## 2016-08-31 MED ORDER — CARVEDILOL 6.25 MG PO TABS
6.2500 mg | ORAL_TABLET | Freq: Two times a day (BID) | ORAL | Status: DC
  Administered 2016-08-31 – 2016-09-01 (×2): 6.25 mg via ORAL
  Filled 2016-08-31 (×2): qty 1

## 2016-08-31 MED ORDER — MELATONIN 3 MG PO TABS
3.0000 mg | ORAL_TABLET | Freq: Every evening | ORAL | Status: DC | PRN
  Administered 2016-08-31: 3 mg via ORAL
  Filled 2016-08-31 (×2): qty 1

## 2016-08-31 MED ORDER — SODIUM CHLORIDE 0.9 % IV BOLUS (SEPSIS)
500.0000 mL | Freq: Once | INTRAVENOUS | Status: AC
  Administered 2016-08-31: 500 mL via INTRAVENOUS

## 2016-08-31 MED ORDER — SACUBITRIL-VALSARTAN 49-51 MG PO TABS
1.0000 | ORAL_TABLET | Freq: Two times a day (BID) | ORAL | Status: DC
  Administered 2016-08-31 – 2016-09-01 (×2): 1 via ORAL
  Filled 2016-08-31 (×3): qty 1

## 2016-08-31 NOTE — H&P (Signed)
[facility] Admission History and Physical Service Pager: [phone]  Patient name: Victor Waters Waters Medical record number: [id number] Date of birth: 02-08-1926 Age: 82 y.o. Gender: male  Primary Care Provider: Lamar Blinks, MD Consultants: Cardiology  Code Status: FULL  Chief Complaint: Syncope  Assessment and Plan: Victor Waters Waters is a 82 y.o. male presenting with syncope. PMH is significant for CAD s/p CABG 2003, cardiomyopathy with EF 25-30% and G1DD (07/2015), Renal Artery Stenosis s/p stent in 01/2010, HTN, Thrombocytopenia, Chronic Tinnitus, Depression/Anxiety, Mild Dementia.  Syncope with Dizziness:  Differentials include cardiac related vs possible seizure. Possibly cardiac related (arrythmia) although patient denied palpitations during this episode. He has multiple PVC on telemetry while in the ED.  Patient has had history of lightheadedness/palpitations and found to have ventricular ectopy during an admission in 12/2014; he was also found to have trigeminal rhythm during another admission in 12/2014. He had a Holter monitor in 01/2015 which showed frequent ventricular ectopy. He has history of CAD with history of MI s/p CABG in 2003. His most recent stress rest in 08/2015 was normal and low risk. EKG in ED with NSR with first degree heart block (noted in past EKG as well) and no signs of ischemia; additionally I stat troponin was 0.00. No chest pain/sob/n/v/diaphoresis. Unlikely ACS. Seizure should be considered as patient did have urinary incontinence during episode, possible abnormal movement during episode and was found to have a bruise on his tongue which is new. TIA/CVA also considered in differential. Neurological exam is unremarkable/nonfocal. CT head and neck without acute findings. - admit to telemetry, attending Dr. McDiarmid - cardiology consulted, appreciate recommendations - EEG; consider neurology consult - orthostatic vitals  - TSH  CAD; Hx of MI  s/p CABG in 2003: ASA 325mg  in ED.  - continue ASA 81mg   - continue Zetia (per notes patient intolerant of statin)   Cardiomyopathy: ECHO in 07/2015 with EF 25-30% and G1DD. Per chart review, Furosemide 20mg  PRN for swelling; patient has not used since march/april. Clinically not in acute exacerbation; only very mild trace pitting edema of bilateral lower extremities. Weight seems to be at baseline. BNP 114.3 on admit. CXR without congestion.  - continue Coreg 6.25mg  BID - continue Entresto BID - will hold PRN Lasix for now  Thrombocytopenia: chronic. No signs of bleeding - monitor with lab  History of Falls: 3 falls which sound mechanical over the past year.  - consider PT evaluation after evaluated by cards.   FEN/GI: heart healthy Prophylaxis: Lovenox   Disposition: admit for further evaluation of syncope   History of Present Illness:  Victor Waters Waters is a 81 y.o. male presenting with syncope and dizziness. Patient's daughter assisted with providing history.   This morning when walking down 5 steps out of his apartment, patient reported of feeling dizzy to his daughter; he denied feeling as if the environment was spinning but could not further describe his symptom. Her daughter then assisted his back to his apartment. He was down to eat breakfast and the patient reported of blurred vision that lasted for a few seconds. Shortly after, his daughter assisted him to his room. While walking, his daughter reported that he suddenly passed out and fell to the floor. She is uncertain if he hit his head but reports she notices a small abrasion on his left frontal head. She reported of him having fine shaking initially for a few seconds and then his body relaxed. He had urinary incontinence during the episode.  Per daughter he seemed confused shortly afterwards. Denies chest pain, palpitations, shortness of breath during this event. He also had two dizzy spells while in the ED with associated blurred  vision which only lasted a few seconds.  He also reported that he has had dizzy spells recently (uable to tell for how long) associated with blurred vision and mild HA. He also reports feeling "funny" and "not himself" since March but could not elaborate further about this.   Has had normal PO intake. Reports of strong smelling urine for the past 2 months. No abdominal pain, dysuria, hematuria, fevers, or chills. He has chronic tinnitus which has been stable.   Reports of 3 falls over the past year. Fell while walking his dog for example.   Patient lives with wife. Able to bathe and dress himself. Able to ambulate independently. Does not do finances.   In the ED, he was given 500cc bolus and ASA 325mg .   Family history of stroke.  No tobacco use, alcohol use, or illicit drug use.   Review Of Systems: Per HPI  Otherwise the remainder of the systems were negative.  Patient Active Problem List   Diagnosis Date Noted  . Syncope and collapse 08/31/2016  . Mild dementia 08/10/2016  . Viral URI with cough 01/31/2016  . Sleep disturbance 01/06/2016  . Loose stools 01/06/2016  . Ventricular ectopy 12/31/2014  . Acute confusional state 12/31/2014  . Weakness 12/23/2014  . Palpitations   . Nocturia 09/20/2013  . Hyponatremia 09/29/2012  . Fatigue 05/09/2012  . PERSONAL HISTORY OTHER DISORDER URINARY SYSTEM 12/22/2010  . Thrombocytopenia ([place]) 08/04/2010  . LEUKOCYTOPENIA UNSPECIFIED 08/04/2010  . TINNITUS, CHRONIC 07/06/2010  . RENAL ARTERY STENOSIS 11/25/2009  . RENAL INSUFFICIENCY 11/25/2009  . HYPOTENSION 11/01/2009  . DYSPNEA 09/24/2009  . BRADYCARDIA 09/04/2009  . HYPOTENSION-ORTHOSTATIC 09/04/2009  . DIZZINESS 09/04/2009  . HYPERLIPIDEMIA 01/31/2008  . DEPRESSIVE DISORDER 01/23/2008  . Essential hypertension 01/23/2008  . ANXIETY 06/02/2007  . Coronary atherosclerosis 06/02/2007  . INSOMNIA 06/02/2007    Past Medical History: Past Medical History:  Diagnosis Date  .  Anxiety   . Coronary artery disease    a. s/p CABG;  b. LHC (2/11):  LM 30-40, pLAD 95-99 then 80 and 90, mCFX 90, pRCA occluded, S-dRCA ok, S-D1 ok, S-OM patent with 50, L-LAD ok.  Med Rx recommended.  c.  Myoview (5/13):  Low risk with small fixed inf defect c/w scar, no ischemia, EF 48%.  d.  Echo (6/13):  EF 55-60%, inf HK, Gr 1 DD, MAC;  e. Lexiscan Myoview (10/14):  Low risk, EF 38%, inferobasal infarct, no ischemia.  . Fatigue   . Hx of echocardiogram    Echo (10/14):  EF 55-60%, Gr 1DD, MAC, mild MR, mild LAE.  Marland Kitchen Hyperlipidemia   . Hypertension   . Hyponatremia 09/2012   Associated with postural hypotension and confusion  . Insomnia   . Myocardial infarction ([place]) 2003   hx of   . Renal artery stenosis ([facility])    bilateral;  s/p R RA stent 2011    Past Surgical History: Past Surgical History:  Procedure Laterality Date  . CARDIAC CATHETERIZATION  2003   revdeling severe three-vessel disease  . CORONARY ARTERY BYPASS GRAFT     LIMA to LAD, SVG to diagonal, SVG to RCA, and SVG to circumflex  . POLYPECTOMY     colon  . RENAL ARTERY STENT  01-28-10    Social History: Social History  Substance Use Topics  .  Smoking status: Never Smoker  . Smokeless tobacco: Never Used  . Alcohol use No   Please also refer to relevant sections of EMR.  Family History: Family History  Problem Relation Age of Onset  . Diabetes Mother   . Stroke Mother   . Heart attack Father 90    died  . Colon cancer Other     paternal grandfather    Allergies and Medications: Allergies  Allergen Reactions  . Lisinopril Cough    Renal artery stenosis by history; ACE inhibitor  would be relatively contraindicated  . Zithromax [Azithromycin]     02/15/14 N&V  . Niacin Other (See Comments)    Burning sensations in head   No current facility-administered medications on file prior to encounter.    Current Outpatient Prescriptions on File Prior to Encounter  Medication Sig Dispense Refill  .  aspirin 81 MG tablet Take 81 mg by mouth at bedtime.     Marland Kitchen CALCIUM-MAGNESIUM-ZINC PO Take 1 tablet by mouth daily.    . carvedilol (COREG) 6.25 MG tablet TAKE 1 TABLET TWICE DAILY 180 tablet 0  . cholecalciferol (VITAMIN D) 1000 UNITS tablet Take 2,000 Units by mouth daily.    Marland Kitchen ezetimibe (ZETIA) 10 MG tablet Take 0.5 tablets (5 mg total) by mouth daily. 45 tablet 3  . Melatonin 3 MG TBDP Take 1 tablet by mouth at bedtime as needed (sleep). Reported on 04/29/2016    . nitroGLYCERIN (NITROSTAT) 0.4 MG SL tablet DISSOLVE 1 TAB UNDER TONGUE AS NEEDED FOR CHEST PAIN, MAY REPEAT IN 5 MINUTES AS DIRECTED. ER IF NO BETTER. 30 tablet 0  . sacubitril-valsartan (ENTRESTO) 49-51 MG Take 1 tablet by mouth 2 (two) times daily. 60 tablet 9  . tamsulosin (FLOMAX) 0.4 MG CAPS capsule TAKE 1 CAPSULE BY MOUTH DAILY AFTER SUPPER 90 capsule 1    Objective: BP 144/81   Pulse 79   Resp 13   Wt 146 lb (66.2 kg)   SpO2 98%   BMI 25.86 kg/m  Exam: GEN: NAD, alert and oriented x 3  HEENT: small superficial abrasion on right frontal area of head EOMI, sclera clear, no carotid bruit noted bilaterally; oropharynx- bruise noted on right side of tongue which patient reports was not present yesterday; additionally tenderness to palpation of this area.  CV: RRR but intermittent pauses noted, no murmurs, rubs, or gallops PULM: CTAB, normal effort ABD: Soft, nontender, nondistended, NABS, no organomegaly SKIN: No rash or cyanosis; warm and well-perfused EXTR: no calf tenderness, minimal trace pitting edema of bilateral LE PSYCH: Mood and affect euthymic, normal rate and volume of speech NEURO: Awake, alert, no focal deficits (5/5 strength in upper and lower extremities, normal sensation to light touch, normal finger to nose for cerebellar function), normal speech  Labs and Imaging: CBC BMET   Recent Labs Lab 08/31/16 0810  WBC 5.5  HGB 13.7  HCT 40.9  PLT 110*    Recent Labs Lab 08/31/16 0810  NA 136  K 3.9   CL 105  CO2 24  BUN 19  CREATININE 1.03  GLUCOSE 183*  CALCIUM 9.1     BNP 114.3 Istat troponin 0.00 Magnesium: 2.2  EKG: NSR, 1st degree AV block. No signs of ischemia.   CXR: FINDINGS: Prior CABG. Heart and mediastinal contours are within normal limits. No focal opacities or effusions. No acute bony abnormality.  IMPRESSION: No active cardiopulmonary disease.  CT Head and C-spine:  FINDINGS: CT HEAD FINDINGS  Brain: No intracranial hemorrhage or  CT evidence of large acute infarct.  Remote small infarct left corona radiata/ superior left lenticular nucleus.  Global atrophy without hydrocephalus.  No intracranial mass lesion noted on this unenhanced exam.  Vascular: Vascular calcification  Skull: No skull fracture  Sinuses/Orbits: No acute orbital abnormality. Mild right-sided ethmoid air cell and minimal left maxillary sinus mucosal thickening.  Other: Negative  CT CERVICAL SPINE FINDINGS  Alignment: Minimal retrolisthesis C5.  Skull base and vertebrae: No cervical spine fracture.  Soft tissues and spinal canal: No abnormal prevertebral soft tissue swelling. Cervical spondylotic changes with various degrees of spinal stenosis and foraminal narrowing most notable C4-5 thru C6-7.  Upper chest: Negative.  Other: Carotid bifurcation calcifications.  IMPRESSION: CT HEAD  No skull fracture or intracranial hemorrhage.  Remote small infarct left corona radiata/ superior left lenticular nucleus.  Global atrophy without hydrocephalus.  CT CERVICAL SPINE  No cervical spine fracture.  Cervical spondylotic changes with various degrees of spinal stenosis and foraminal narrowing most notable C4-5 thru C6-7.  Smiley Houseman, MD 08/31/2016, 12:25 PM PGY-2, [place] Intern pager: [phone], text pages welcome

## 2016-08-31 NOTE — Consult Note (Signed)
Cardiology Consult    Patient ID: Victor Waters MRN: [id number], DOB/AGE: August 05, 1926   Admit date: 08/31/2016 Date of Consult: 08/31/2016  Primary Physician: Lamar Blinks, MD Reason for Consult: syncope Primary Cardiologist: Dr. Percival Spanish Requesting Provider: Dr. McDiarmid  Patient Profile  82 year old male with a past medical history of Coronary artery disease status post CABG, dilated cardiomyopathy, renal artery stenosis status post right renal artery stent in February 2011, hypertension and hyperlipidemia.  History of Present Illness  Mr. Victor Waters was walking down 5 steps out of his apartment, and reported feeling dizzy to his daughter; he denied feeling as if the environment was spinning but could not further describe his symptoms. His daughter then assisted his back to his apartment, which is next door. He was down to eat breakfast and the patient reported of blurred vision that lasted for a few seconds. Shortly after, his daughter assisted him to his room. While walking, his daughter reported that he suddenly passed out and fell to the floor. She is uncertain if he hit his head but reports she notices a small abrasion on his left frontal head.  He reports frequent dizzy spells in the last few months with headaches. This has gone on since March where he reports feeling "funny" and like his vision is blurred. He walks daily in his neighborhood and has also fallen while walking his dog. He is highly functional otherwise he ambulates independently and independently performs all of his ADLs.  His last echo was in August 2016 that showed EF 25-30% which was mildly lower than his echo done 8 months prior and his EF was 35-40%. He was started on low-dose Entresto in March of this year, this was increased to [facility] 49/51mg  which is what he is currently taking.   He has been noted to have frequent PVCs in previous office visits. It was questionable whether his PVC burden was enough to  contribute to his worsening LV function. He had a normal Lexiscan Myoview that ruled out an ischemic cause.  At the time of my encounter he had about 10 minutes of ventricular bigeminy. He became flushed and red in the face and said that he did not feel right. He also reported a headache and blurred vision.   An EEG was completed today that was normal.  Past Medical History   Past Medical History:  Diagnosis Date  . Anxiety   . Coronary artery disease    a. s/p CABG;  b. LHC (2/11):  LM 30-40, pLAD 95-99 then 80 and 90, mCFX 90, pRCA occluded, S-dRCA ok, S-D1 ok, S-OM patent with 50, L-LAD ok.  Med Rx recommended.  c.  Myoview (5/13):  Low risk with small fixed inf defect c/w scar, no ischemia, EF 48%.  d.  Echo (6/13):  EF 55-60%, inf HK, Gr 1 DD, MAC;  e. Lexiscan Myoview (10/14):  Low risk, EF 38%, inferobasal infarct, no ischemia.  . Fatigue   . Hx of echocardiogram    Echo (10/14):  EF 55-60%, Gr 1DD, MAC, mild MR, mild LAE.  Marland Kitchen Hyperlipidemia   . Hypertension   . Hyponatremia 09/2012   Associated with postural hypotension and confusion  . Insomnia   . Myocardial infarction ([place]) 2003   hx of   . Renal artery stenosis ([facility])    bilateral;  s/p R RA stent 2011    Past Surgical History:  Procedure Laterality Date  . CARDIAC CATHETERIZATION  2003   revdeling severe three-vessel disease  .  CORONARY ARTERY BYPASS GRAFT     LIMA to LAD, SVG to diagonal, SVG to RCA, and SVG to circumflex  . POLYPECTOMY     colon  . RENAL ARTERY STENT  01-28-10     Allergies  Allergies  Allergen Reactions  . Lisinopril Cough    Renal artery stenosis by history; ACE inhibitor  would be relatively contraindicated  . Zithromax [Azithromycin]     02/15/14 N&V  . Niacin Other (See Comments)    Burning sensations in head    Inpatient Medications    . [START ON 09/01/2016] aspirin EC  81 mg Oral Daily  . carvedilol  6.25 mg Oral BID WC  . enoxaparin (LOVENOX) injection  40 mg Subcutaneous Q24H    . ezetimibe  5 mg Oral Daily  . sacubitril-valsartan  1 tablet Oral BID  . tamsulosin  0.4 mg Oral QPC supper    Family History    Family History  Problem Relation Age of Onset  . Diabetes Mother   . Stroke Mother   . Heart attack Father 56    died  . Colon cancer Other     paternal grandfather    Social History    Social History   Social History  . Marital status: Married    Spouse name: N/A  . Number of children: N/A  . Years of education: N/A   Occupational History  . Not on file.   Social History Main Topics  . Smoking status: Never Smoker  . Smokeless tobacco: Never Used  . Alcohol use No  . Drug use: No  . Sexual activity: Not on file   Other Topics Concern  . Not on file   Social History Narrative   Married   Lives with wife and 50year old ggd     Review of Systems    General:  No chills, fever, night sweats or weight changes.  Cardiovascular:  No chest pain, dyspnea on exertion, edema, orthopnea, palpitations, paroxysmal nocturnal dyspnea. Dermatological: No rash, lesions/masses Respiratory: No cough, dyspnea Urologic: No hematuria, dysuria Abdominal:   No nausea, vomiting, diarrhea, bright red blood per rectum, melena, or hematemesis Neurologic:  + visual changes, + wkns, changes in mental status. All other systems reviewed and are otherwise negative except as noted above.  Physical Exam    Blood pressure (!) 150/89, pulse 78, resp. rate 12, weight 146 lb (66.2 kg), SpO2 100 %.  General: Pleasant, NAD Psych: Normal affect. Neuro: Alert and oriented X 3. Moves all extremities spontaneously. HEENT: Normal  Neck: Supple without bruits or JVD. Lungs:  Resp regular and unlabored, CTA. Heart: RRR no s3, s4, or murmurs. Abdomen: Soft, non-tender, non-distended, BS + x 4.  Extremities: No clubbing, cyanosis or edema. DP/PT/Radials 2+ and equal bilaterally.  Labs    Troponin [facility] Test)  Recent Labs  08/31/16 0822  TROPIPOC 0.00     Lab Results  Component Value Date   WBC 5.5 08/31/2016   HGB 13.7 08/31/2016   HCT 40.9 08/31/2016   MCV 92.3 08/31/2016   PLT 110 (L) 08/31/2016    Recent Labs Lab 08/31/16 0810  NA 136  K 3.9  CL 105  CO2 24  BUN 19  CREATININE 1.03  CALCIUM 9.1  PROT 5.6*  BILITOT 1.0  ALKPHOS 39  ALT 22  AST 27  GLUCOSE 183*    Radiology Studies    Dg Chest 2 View  Result Date: 08/31/2016 CLINICAL DATA:  Syncope.  Fall.  EXAM: CHEST  2 VIEW COMPARISON:  01/28/2016 FINDINGS: Prior CABG. Heart and mediastinal contours are within normal limits. No focal opacities or effusions. No acute bony abnormality. IMPRESSION: No active cardiopulmonary disease. Electronically Signed   By: Rolm Baptise M.D.   On: 08/31/2016 09:07   Ct Head Wo Contrast  Result Date: 08/31/2016 CLINICAL DATA:  82 year old male blacked out and fell. Anterior right-sided neck pain. Initial encounter. EXAM: CT HEAD WITHOUT CONTRAST CT CERVICAL SPINE WITHOUT CONTRAST TECHNIQUE: Multidetector CT imaging of the head and cervical spine was performed following the standard protocol without intravenous contrast. Multiplanar CT image reconstructions of the cervical spine were also generated. COMPARISON:  12/30/2014 head CT. FINDINGS: CT HEAD FINDINGS Brain: No intracranial hemorrhage or CT evidence of large acute infarct. Remote small infarct left corona radiata/ superior left lenticular nucleus. Global atrophy without hydrocephalus. No intracranial mass lesion noted on this unenhanced exam. Vascular: Vascular calcification Skull: No skull fracture Sinuses/Orbits: No acute orbital abnormality. Mild right-sided ethmoid air cell and minimal left maxillary sinus mucosal thickening. Other: Negative CT CERVICAL SPINE FINDINGS Alignment: Minimal retrolisthesis C5. Skull base and vertebrae: No cervical spine fracture. Soft tissues and spinal canal: No abnormal prevertebral soft tissue swelling. Cervical spondylotic changes with various  degrees of spinal stenosis and foraminal narrowing most notable C4-5 thru C6-7. Upper chest: Negative. Other: Carotid bifurcation calcifications. IMPRESSION: CT HEAD No skull fracture or intracranial hemorrhage. Remote small infarct left corona radiata/ superior left lenticular nucleus. Global atrophy without hydrocephalus. CT CERVICAL SPINE No cervical spine fracture. Cervical spondylotic changes with various degrees of spinal stenosis and foraminal narrowing most notable C4-5 thru C6-7. Electronically Signed   By: Genia Del M.D.   On: 08/31/2016 09:24   Ct Cervical Spine Wo Contrast  Result Date: 08/31/2016 CLINICAL DATA:  82 year old male blacked out and fell. Anterior right-sided neck pain. Initial encounter. EXAM: CT HEAD WITHOUT CONTRAST CT CERVICAL SPINE WITHOUT CONTRAST TECHNIQUE: Multidetector CT imaging of the head and cervical spine was performed following the standard protocol without intravenous contrast. Multiplanar CT image reconstructions of the cervical spine were also generated. COMPARISON:  12/30/2014 head CT. FINDINGS: CT HEAD FINDINGS Brain: No intracranial hemorrhage or CT evidence of large acute infarct. Remote small infarct left corona radiata/ superior left lenticular nucleus. Global atrophy without hydrocephalus. No intracranial mass lesion noted on this unenhanced exam. Vascular: Vascular calcification Skull: No skull fracture Sinuses/Orbits: No acute orbital abnormality. Mild right-sided ethmoid air cell and minimal left maxillary sinus mucosal thickening. Other: Negative CT CERVICAL SPINE FINDINGS Alignment: Minimal retrolisthesis C5. Skull base and vertebrae: No cervical spine fracture. Soft tissues and spinal canal: No abnormal prevertebral soft tissue swelling. Cervical spondylotic changes with various degrees of spinal stenosis and foraminal narrowing most notable C4-5 thru C6-7. Upper chest: Negative. Other: Carotid bifurcation calcifications. IMPRESSION: CT HEAD No skull  fracture or intracranial hemorrhage. Remote small infarct left corona radiata/ superior left lenticular nucleus. Global atrophy without hydrocephalus. CT CERVICAL SPINE No cervical spine fracture. Cervical spondylotic changes with various degrees of spinal stenosis and foraminal narrowing most notable C4-5 thru C6-7. Electronically Signed   By: Genia Del M.D.   On: 08/31/2016 09:24    EKG & Cardiac Imaging    EKG: NSR  Echocardiogram: 08/05/15 Study Conclusions  - Left ventricle: The cavity size was mildly dilated. Wall   thickness was increased in a pattern of mild LVH. Systolic   function was severely reduced. The estimated ejection fraction   was in the range of 25%  to 30%. Diffuse hypokinesis. Doppler   parameters are consistent with abnormal left ventricular   relaxation (grade 1 diastolic dysfunction). - Mitral valve: Severely calcified annulus. Mildly thickened   leaflets . There was mild regurgitation. - Left atrium: The atrium was mildly dilated. - Atrial septum: There was an atrial septal aneurysm.  Impressions:  - Severe, gobal reduction in LV function (EF 30); grade 1 diastolic   dysfunction; mild LVE and LVH; sclerotic aortic valve; mild MR;   mild LAE.   Assessment & Plan    1. Syncope: Presents with syncopal episode after feeling dizzy. With frequent PVCs and periods of bigeminy noted on the monitor. We will follow his telemetry and review further in the morning. He will also need a 30 day monitor discharge, which we can arrange.  2. Frequent PVCs/ventricular bigeminy: Noted on telemetry. We'll continue beta blocker, currently on Coreg 12.5 twice a day. He has not had his beta blocker today. Will monitor PVC Barton overnight after the administration of his beta blocker.  3. Chronic systolic CHF/dilated cardiomyopathy: Continue Entresto and Coreg. Can consider adding spironolactone with low EF. However need to assess orthostatic vital signs before we add any  other agents.    Signed, Arbutus Leas, NP 08/31/2016, 4:34 PM Pager: [phone]   The patient has been seen in conjunction with Arbutus Leas, NP. All aspects of care have been considered and discussed. The patient has been personally interviewed, examined, and all clinical data has been reviewed.   Episode as described above. The patient witnessed syncope by his daughter. Preceded by a sensation of deeming vision. Had had multiple similar vision deeming episodes. Also 2 other recent falls  Overall, has had increasing difficulty with memory.  ECG is unremarkable. No ectopy noted.  Syncope is of unknown cause. Plan to monitor while hospitalized. Needs neuro w/u and ambulatory monitoring. Needs orthostatic BP recordings.  He should not be driving due to memory problems and this episode of LOC.

## 2016-08-31 NOTE — ED Triage Notes (Signed)
Pt in from home after LOC with syncope. Per EMS, pt was walking around house, sat down for breakfast, then his vision became blurry. He got up to go lie down and had syncopal fall, landing onto carpet. Pt recalls waking up on floor, down for <30 sec per family. C/o weakness, nausea per EMS. Hx of CABG 13 yrs ago, EKG SR with PVC's. Given Zofran IV en route, MAE's, CBG 149. A&ox4

## 2016-08-31 NOTE — ED Notes (Signed)
Pt. Family member upset and yelling at tech saying ' no one has been in here in 45 minutes. I don't trust you to send that urine because you haven't been in here'. RN and tech tried to calm family member down.

## 2016-08-31 NOTE — Progress Notes (Signed)
EEG completed, results pending. 

## 2016-08-31 NOTE — ED Provider Notes (Signed)
[facility] DEPT Provider Note   CSN: [id number] Arrival date & time: 08/31/16  0754     History   Chief Complaint No chief complaint on file.   HPI Victor Waters is a 82 y.o. male with a past medical history significant for renal artery stenosis status post stent, CAD status post CABG, cardiomyopathy with a recent EF of 25-30%, hypertension, and hyperlipidemia who presents with syncope. Patient reports associated fatigue, recent history of foul-smelling urine, nausea, vomiting and lightheadedness. Patient denies chest pain, palpitations, shortness of breath, Diaphoresis, constipation, diarrhea. Patient reports normal activity, by mouth intake, And was feeling well until this morning. Patient does report that he lost continence of urine following his syncopal episode however, he has no histories of seizures and did not report a postictal period. Family reports that patient was out for approximately 30 seconds.   The history is provided by the patient and a relative (daughter). No language interpreter was used.  Loss of Consciousness   This is a new problem. The current episode started less than 1 hour ago. The problem has been resolved. He lost consciousness for a period of less than one minute. The problem is associated with normal activity. Associated symptoms include light-headedness, malaise/fatigue, nausea, visual change (vision darkened, resolved) and vomiting. Pertinent negatives include abdominal pain, back pain, chest pain, confusion, congestion, diaphoresis, dizziness, fever, focal sensory loss, headaches, palpitations, seizures, vertigo and weakness. He has tried nothing for the symptoms. His past medical history is significant for CAD and HTN.    Past Medical History:  Diagnosis Date  . Anxiety   . Coronary artery disease    a. s/p CABG;  b. LHC (2/11):  LM 30-40, pLAD 95-99 then 80 and 90, mCFX 90, pRCA occluded, S-dRCA ok, S-D1 ok, S-OM patent with 50, L-LAD ok.  Med Rx  recommended.  c.  Myoview (5/13):  Low risk with small fixed inf defect c/w scar, no ischemia, EF 48%.  d.  Echo (6/13):  EF 55-60%, inf HK, Gr 1 DD, MAC;  e. Lexiscan Myoview (10/14):  Low risk, EF 38%, inferobasal infarct, no ischemia.  . Fatigue   . Hx of echocardiogram    Echo (10/14):  EF 55-60%, Gr 1DD, MAC, mild MR, mild LAE.  Marland Kitchen Hyperlipidemia   . Hypertension   . Hyponatremia 09/2012   Associated with postural hypotension and confusion  . Insomnia   . Myocardial infarction ([place]) 2003   hx of   . Renal artery stenosis ([facility])    bilateral;  s/p R RA stent 2011    Patient Active Problem List   Diagnosis Date Noted  . Mild dementia 08/10/2016  . Viral URI with cough 01/31/2016  . Sleep disturbance 01/06/2016  . Loose stools 01/06/2016  . Ventricular ectopy 12/31/2014  . Acute confusional state 12/31/2014  . Weakness 12/23/2014  . Palpitations   . Nocturia 09/20/2013  . Hyponatremia 09/29/2012  . Fatigue 05/09/2012  . PERSONAL HISTORY OTHER DISORDER URINARY SYSTEM 12/22/2010  . Thrombocytopenia ([place]) 08/04/2010  . LEUKOCYTOPENIA UNSPECIFIED 08/04/2010  . TINNITUS, CHRONIC 07/06/2010  . RENAL ARTERY STENOSIS 11/25/2009  . RENAL INSUFFICIENCY 11/25/2009  . HYPOTENSION 11/01/2009  . DYSPNEA 09/24/2009  . BRADYCARDIA 09/04/2009  . HYPOTENSION-ORTHOSTATIC 09/04/2009  . DIZZINESS 09/04/2009  . HYPERLIPIDEMIA 01/31/2008  . DEPRESSIVE DISORDER 01/23/2008  . Essential hypertension 01/23/2008  . ANXIETY 06/02/2007  . Coronary atherosclerosis 06/02/2007  . INSOMNIA 06/02/2007    Past Surgical History:  Procedure Laterality Date  . CARDIAC CATHETERIZATION  2003   revdeling severe three-vessel disease  . CORONARY ARTERY BYPASS GRAFT     LIMA to LAD, SVG to diagonal, SVG to RCA, and SVG to circumflex  . POLYPECTOMY     colon  . RENAL ARTERY STENT  01-28-10       Home Medications    Prior to Admission medications   Medication Sig Start Date End Date Taking?  Authorizing Provider  aspirin 81 MG tablet Take 81 mg by mouth at bedtime.     Historical Provider, MD  CALCIUM-MAGNESIUM-ZINC PO Take 1 tablet by mouth daily.    Historical Provider, MD  carvedilol (COREG) 6.25 MG tablet TAKE 1 TABLET TWICE DAILY 05/04/16   Minus Breeding, MD  cholecalciferol (VITAMIN D) 1000 UNITS tablet Take 2,000 Units by mouth daily.    Historical Provider, MD  ezetimibe (ZETIA) 10 MG tablet Take 0.5 tablets (5 mg total) by mouth daily. 10/30/15   Hendricks Limes, MD  Melatonin 3 MG TBDP Take 1 tablet by mouth at bedtime as needed (sleep). Reported on 04/29/2016    Historical Provider, MD  nitroGLYCERIN (NITROSTAT) 0.4 MG SL tablet DISSOLVE 1 TAB UNDER TONGUE AS NEEDED FOR CHEST PAIN, MAY REPEAT IN 5 MINUTES AS DIRECTED. ER IF NO BETTER. 10/30/15   Hendricks Limes, MD  Omega-3 Fatty Acids (FISH OIL) 1000 MG CAPS Take 1 capsule by mouth daily.      Historical Provider, MD  sacubitril-valsartan (ENTRESTO) 49-51 MG Take 1 tablet by mouth 2 (two) times daily. 03/26/16   Minus Breeding, MD  tamsulosin (FLOMAX) 0.4 MG CAPS capsule TAKE 1 CAPSULE BY MOUTH DAILY AFTER SUPPER 07/07/16   Darreld Mclean, MD    Family History Family History  Problem Relation Age of Onset  . Diabetes Mother   . Stroke Mother   . Heart attack Father 78    died  . Colon cancer Other     paternal grandfather    Social History Social History  Substance Use Topics  . Smoking status: Never Smoker  . Smokeless tobacco: Never Used  . Alcohol use No     Allergies   Lisinopril; Zithromax [azithromycin]; and Niacin   Review of Systems Review of Systems  Constitutional: Positive for fatigue and malaise/fatigue. Negative for chills, diaphoresis and fever.  HENT: Negative for congestion.   Eyes: Positive for visual disturbance (resolved darkened vision).  Respiratory: Negative for cough, chest tightness, shortness of breath, wheezing and stridor.   Cardiovascular: Positive for syncope. Negative  for chest pain, palpitations and leg swelling.  Gastrointestinal: Positive for nausea and vomiting. Negative for abdominal pain, constipation and diarrhea.  Genitourinary: Positive for difficulty urinating (with foul smelling urine). Negative for dysuria.  Musculoskeletal: Negative for back pain.  Skin: Positive for wound (small abrasion on right forehead). Negative for rash.  Neurological: Positive for syncope and light-headedness. Negative for dizziness, vertigo, seizures, facial asymmetry, speech difficulty, weakness, numbness and headaches.  Psychiatric/Behavioral: Negative for agitation and confusion.  All other systems reviewed and are negative.    Physical Exam Updated Vital Signs BP (!) 150/89   Pulse 78   Resp 12   Wt 146 lb (66.2 kg)   SpO2 100%   BMI 25.86 kg/m   Physical Exam  Constitutional: He is oriented to person, place, and time. He appears well-developed and well-nourished. No distress.  HENT:  Nose: Nose normal.  Mouth/Throat: Oropharynx is clear and moist. No oropharyngeal exudate.  Small abrasion on right foreahead   Eyes: Conjunctivae and  EOM are normal. Pupils are equal, round, and reactive to light. No scleral icterus.  Neck: Neck supple.  Cardiovascular: Normal rate, regular rhythm and intact distal pulses.   No murmur heard.   Pulmonary/Chest: Effort normal. No stridor. No tachypnea. No respiratory distress. He has no wheezes. He has rhonchi. He has no rales. He exhibits no tenderness.  Abdominal: Soft. He exhibits no distension. There is no tenderness.  Musculoskeletal: He exhibits no edema, tenderness or deformity.  Neurological: He is alert and oriented to person, place, and time. He is not disoriented. He displays normal reflexes. No cranial nerve deficit or sensory deficit. He exhibits normal muscle tone. Coordination normal.  Skin: Skin is warm and dry. He is not diaphoretic.  Psychiatric: He has a normal mood and affect.  Nursing note and  vitals reviewed.    ED Treatments / Results  Labs (all labs ordered are listed, but only abnormal results are displayed) Labs Reviewed  CBC WITH DIFFERENTIAL/PLATELET - Abnormal; Notable for the following:       Result Value   Platelets 110 (*)    Lymphs Abs 0.4 (*)    All other components within normal limits  COMPREHENSIVE METABOLIC PANEL - Abnormal; Notable for the following:    Glucose, Bld 183 (*)    Total Protein 5.6 (*)    All other components within normal limits  BRAIN NATRIURETIC PEPTIDE - Abnormal; Notable for the following:    B Natriuretic Peptide 114.3 (*)    All other components within normal limits  CBC - Abnormal; Notable for the following:    Platelets 119 (*)    All other components within normal limits  URINE CULTURE  URINALYSIS, ROUTINE W REFLEX MICROSCOPIC (NOT AT [facility])  MAGNESIUM  CREATININE, SERUM  TSH  I-STAT TROPOININ, ED  CBG MONITORING, ED    EKG  EKG Interpretation  Date/Time:  Tuesday August 31 2016 07:55:11 EDT Ventricular Rate:  79 PR Interval:    QRS Duration: 100 QT Interval:  396 QTC Calculation: 454 R Axis:   47 Text Interpretation:  Sinus rhythm Prolonged PR interval Borderline T abnormalities, inferior leads Confirmed by Sherry Ruffing MD, CHRISTOPHER [phone]) on 08/31/2016 10:39:45 AM       Radiology Dg Chest 2 View  Result Date: 08/31/2016 CLINICAL DATA:  Syncope.  Fall. EXAM: CHEST  2 VIEW COMPARISON:  01/28/2016 FINDINGS: Prior CABG. Heart and mediastinal contours are within normal limits. No focal opacities or effusions. No acute bony abnormality. IMPRESSION: No active cardiopulmonary disease. Electronically Signed   By: Rolm Baptise M.D.   On: 08/31/2016 09:07   Ct Head Wo Contrast  Result Date: 08/31/2016 CLINICAL DATA:  82 year old male blacked out and fell. Anterior right-sided neck pain. Initial encounter. EXAM: CT HEAD WITHOUT CONTRAST CT CERVICAL SPINE WITHOUT CONTRAST TECHNIQUE: Multidetector CT imaging of the head and  cervical spine was performed following the standard protocol without intravenous contrast. Multiplanar CT image reconstructions of the cervical spine were also generated. COMPARISON:  12/30/2014 head CT. FINDINGS: CT HEAD FINDINGS Brain: No intracranial hemorrhage or CT evidence of large acute infarct. Remote small infarct left corona radiata/ superior left lenticular nucleus. Global atrophy without hydrocephalus. No intracranial mass lesion noted on this unenhanced exam. Vascular: Vascular calcification Skull: No skull fracture Sinuses/Orbits: No acute orbital abnormality. Mild right-sided ethmoid air cell and minimal left maxillary sinus mucosal thickening. Other: Negative CT CERVICAL SPINE FINDINGS Alignment: Minimal retrolisthesis C5. Skull base and vertebrae: No cervical spine fracture. Soft tissues and spinal canal: No  abnormal prevertebral soft tissue swelling. Cervical spondylotic changes with various degrees of spinal stenosis and foraminal narrowing most notable C4-5 thru C6-7. Upper chest: Negative. Other: Carotid bifurcation calcifications. IMPRESSION: CT HEAD No skull fracture or intracranial hemorrhage. Remote small infarct left corona radiata/ superior left lenticular nucleus. Global atrophy without hydrocephalus. CT CERVICAL SPINE No cervical spine fracture. Cervical spondylotic changes with various degrees of spinal stenosis and foraminal narrowing most notable C4-5 thru C6-7. Electronically Signed   By: Genia Del M.D.   On: 08/31/2016 09:24   Ct Cervical Spine Wo Contrast  Result Date: 08/31/2016 CLINICAL DATA:  82 year old male blacked out and fell. Anterior right-sided neck pain. Initial encounter. EXAM: CT HEAD WITHOUT CONTRAST CT CERVICAL SPINE WITHOUT CONTRAST TECHNIQUE: Multidetector CT imaging of the head and cervical spine was performed following the standard protocol without intravenous contrast. Multiplanar CT image reconstructions of the cervical spine were also generated.  COMPARISON:  12/30/2014 head CT. FINDINGS: CT HEAD FINDINGS Brain: No intracranial hemorrhage or CT evidence of large acute infarct. Remote small infarct left corona radiata/ superior left lenticular nucleus. Global atrophy without hydrocephalus. No intracranial mass lesion noted on this unenhanced exam. Vascular: Vascular calcification Skull: No skull fracture Sinuses/Orbits: No acute orbital abnormality. Mild right-sided ethmoid air cell and minimal left maxillary sinus mucosal thickening. Other: Negative CT CERVICAL SPINE FINDINGS Alignment: Minimal retrolisthesis C5. Skull base and vertebrae: No cervical spine fracture. Soft tissues and spinal canal: No abnormal prevertebral soft tissue swelling. Cervical spondylotic changes with various degrees of spinal stenosis and foraminal narrowing most notable C4-5 thru C6-7. Upper chest: Negative. Other: Carotid bifurcation calcifications. IMPRESSION: CT HEAD No skull fracture or intracranial hemorrhage. Remote small infarct left corona radiata/ superior left lenticular nucleus. Global atrophy without hydrocephalus. CT CERVICAL SPINE No cervical spine fracture. Cervical spondylotic changes with various degrees of spinal stenosis and foraminal narrowing most notable C4-5 thru C6-7. Electronically Signed   By: Genia Del M.D.   On: 08/31/2016 09:24    Procedures Procedures (including critical care time)  Medications Ordered in ED Medications  tamsulosin (FLOMAX) capsule 0.4 mg (0.4 mg Oral Given 08/31/16 1638)  carvedilol (COREG) tablet 6.25 mg (6.25 mg Oral Given 08/31/16 1638)  sacubitril-valsartan (ENTRESTO) 49-51 mg per tablet (not administered)  ezetimibe (ZETIA) tablet 5 mg (5 mg Oral Given 08/31/16 1638)  enoxaparin (LOVENOX) injection 40 mg (40 mg Subcutaneous Given 08/31/16 1638)  aspirin EC tablet 81 mg (not administered)  aspirin chewable tablet 324 mg (324 mg Oral Given 08/31/16 0914)  sodium chloride 0.9 % bolus 500 mL (0 mLs Intravenous Stopped  08/31/16 1225)     Initial Impression / Assessment and Plan / ED Course  I have reviewed the triage vital signs and the nursing notes.  Pertinent labs & imaging results that were available during my care of the patient were reviewed by me and considered in my medical decision making (see chart for details).  Clinical Course    Victor Waters is a 82 y.o. male with a past medical history significant for CAD status post CABG, cardiomyopathy, renal artery stenosis status post stenting, hypertension, hyperlipidemia who presents with syncope and lightheadedness. Initial EKG showed some borderline T-wave abnormalities as well as a prolonged PR interval. Patient was noted to have multiple PVCs on telemetry review. The patient's initial troponin was negative, CBC revealed normal white count and hemoglobin. Patient has history of intermittent thrombocytopenia which was present at 110. CMP showed slightly elevated glucose of 183, otherwise did not show significant  abnormalities. Patient's magnesium was normal. BNP was slightly elevated at 114 however, did not appear patient had prior reading. Head and neck CT scan did not show acute intracranial bleeding or trauma. There was remote infarct which was relayed to patient and family. Chest x-ray showed no cardiopulmonary disease.  The patient did report foul-smelling urine, urinalysis did not show UTI.   During course of workup, patient reported having episodes of lightheadedness and near syncope. Telemetry revealed episodes of ectopy during episodes with PVCs.  Feel patient's syncope likely secondary to cardiac etiology more then seizure activity given the description by the patient and family.  Given patient's age and continued concern for syncopal episodes with falls, patient admitted under observation for further management of symptoms.     Final Clinical Impressions(s) / ED Diagnoses   Final diagnoses:  Syncope and collapse    New  Prescriptions Current Discharge Medication List      Clinical Impression: 1. Syncope and collapse     Disposition: Admit  Condition: Stable    Gwenyth Allegra Tegeler, MD 08/31/16 1655

## 2016-08-31 NOTE — ED Notes (Signed)
Pt taken to CT.

## 2016-08-31 NOTE — Procedures (Signed)
ELECTROENCEPHALOGRAM REPORT  Date of Study: 08/31/2016  Patient's Name: Victor Waters MRN: [id number] Date of Birth: 05-14-1926  Referring Provider: Dennie Fetters, MD  Clinical History: 82 y.o. male presenting with syncope  Medications: aspirin EC tablet 81 mg    carvedilol (COREG) tablet 6.25 mg   enoxaparin (LOVENOX) injection 40 mg   ezetimibe (ZETIA) tablet 5 mg   sacubitril-valsartan (ENTRESTO) 49-51 mg per tablet   tamsulosin (FLOMAX) capsule 0.4 mg  Technical Summary: A multichannel digital EEG recording measured by the international 10-20 system with electrodes applied with paste and impedances below 5000 ohms performed in our laboratory with EKG monitoring in an awake and asleep patient.  Hyperventilation and photic stimulation were not performed.  The digital EEG was referentially recorded, reformatted, and digitally filtered in a variety of bipolar and referential montages for optimal display.    Description: The patient is awake and asleep during the recording.  During maximal wakefulness, there is a symmetric, medium voltage 11 Hz posterior dominant rhythm that attenuates with eye opening.  The record is symmetric.  During drowsiness and sleep, there is an increase in theta slowing of the background.  Vertex waves and symmetric sleep spindles were seen.  There were no epileptiform discharges or electrographic seizures seen.    EKG lead was unremarkable.  Impression: This awake and asleep EEG is normal.    Clinical Correlation: A normal EEG does not exclude a clinical diagnosis of epilepsy.  If further clinical questions remain, prolonged EEG may be helpful.  Clinical correlation is advised.   Metta Clines, DO

## 2016-08-31 NOTE — ED Notes (Signed)
Per Vicente Males, RN patient is aware of need of urine specimen; cannot go at this time; will check back with patient later

## 2016-09-01 ENCOUNTER — Other Ambulatory Visit: Payer: Self-pay | Admitting: Physician Assistant

## 2016-09-01 ENCOUNTER — Observation Stay (HOSPITAL_BASED_OUTPATIENT_CLINIC_OR_DEPARTMENT_OTHER): Payer: Commercial Managed Care - HMO

## 2016-09-01 ENCOUNTER — Encounter (HOSPITAL_COMMUNITY): Payer: Self-pay | Admitting: General Practice

## 2016-09-01 DIAGNOSIS — S0993XA Unspecified injury of face, initial encounter: Secondary | ICD-10-CM | POA: Diagnosis not present

## 2016-09-01 DIAGNOSIS — I5022 Chronic systolic (congestive) heart failure: Secondary | ICD-10-CM | POA: Diagnosis not present

## 2016-09-01 DIAGNOSIS — S0093XA Contusion of unspecified part of head, initial encounter: Secondary | ICD-10-CM | POA: Diagnosis not present

## 2016-09-01 DIAGNOSIS — R55 Syncope and collapse: Secondary | ICD-10-CM

## 2016-09-01 LAB — BASIC METABOLIC PANEL
ANION GAP: 6 (ref 5–15)
BUN: 18 mg/dL (ref 6–20)
CO2: 27 mmol/L (ref 22–32)
Calcium: 9.2 mg/dL (ref 8.9–10.3)
Chloride: 103 mmol/L (ref 101–111)
Creatinine, Ser: 1.02 mg/dL (ref 0.61–1.24)
Glucose, Bld: 120 mg/dL — ABNORMAL HIGH (ref 65–99)
POTASSIUM: 4.3 mmol/L (ref 3.5–5.1)
SODIUM: 136 mmol/L (ref 135–145)

## 2016-09-01 LAB — ECHOCARDIOGRAM COMPLETE: Weight: 2259.2 oz

## 2016-09-01 LAB — CBC
HEMATOCRIT: 42.4 % (ref 39.0–52.0)
HEMOGLOBIN: 13.8 g/dL (ref 13.0–17.0)
MCH: 29.9 pg (ref 26.0–34.0)
MCHC: 32.5 g/dL (ref 30.0–36.0)
MCV: 91.8 fL (ref 78.0–100.0)
Platelets: 124 10*3/uL — ABNORMAL LOW (ref 150–400)
RBC: 4.62 MIL/uL (ref 4.22–5.81)
RDW: 13.9 % (ref 11.5–15.5)
WBC: 4.7 10*3/uL (ref 4.0–10.5)

## 2016-09-01 LAB — URINE CULTURE

## 2016-09-01 MED ORDER — CARVEDILOL 3.125 MG PO TABS: 3.1250 mg | ORAL_TABLET | Freq: Two times a day (BID) | ORAL | Status: DC

## 2016-09-01 MED ORDER — CARVEDILOL 3.125 MG PO TABS: 3.1250 mg | ORAL_TABLET | Freq: Two times a day (BID) | ORAL | 0 refills | Status: DC

## 2016-09-01 NOTE — Care Management Obs Status (Signed)
[facility] NOTIFICATION   Patient Details  Name: Victor Waters MRN: [id number] Date of Birth: 12-05-1926   Medicare Observation Status Notification Given:  Yes    Bethena Roys, RN 09/01/2016, 12:23 PM

## 2016-09-01 NOTE — Progress Notes (Signed)
Echocardiogram 2D Echocardiogram has been performed.  Victor Waters 09/01/2016, 11:08 AM

## 2016-09-01 NOTE — Progress Notes (Signed)
Patient Name: Victor Waters Date of Encounter: 09/01/2016     Principal Problem:   Syncope and collapse Active Problems:   Head contusion   Tongue injury   Chronic systolic heart failure (HCC)   Cerebral infarction, remote, resolved    SUBJECTIVE  Resting comfortably. No complaints. Wanting to know when he can go home.   CURRENT MEDS . aspirin EC  81 mg Oral Daily  . carvedilol  6.25 mg Oral BID WC  . enoxaparin (LOVENOX) injection  40 mg Subcutaneous Q24H  . ezetimibe  5 mg Oral Daily  . sacubitril-valsartan  1 tablet Oral BID  . tamsulosin  0.4 mg Oral QPC supper    OBJECTIVE  Vitals:   08/31/16 1443 08/31/16 2355 09/01/16 0125 09/01/16 0317  BP: (!) 150/89 (!) 137/52 135/70 (!) 156/73  Pulse: 78 71 85 85  Resp: 12 14 14 13   Temp:  97.8 F (36.6 C) 98.6 F (37 C) 98.2 F (36.8 C)  TempSrc:  Oral Oral Oral  SpO2: 100% 97% 97% 97%  Weight:    141 lb 3.2 oz (64 kg)    Intake/Output Summary (Last 24 hours) at 09/01/16 0654 Last data filed at 09/01/16 0600  Gross per 24 hour  Intake              240 ml  Output             1175 ml  Net             -935 ml   Filed Weights   08/31/16 0802 09/01/16 0317  Weight: 146 lb (66.2 kg) 141 lb 3.2 oz (64 kg)    PHYSICAL EXAM  General: Pleasant, NAD. Neuro: Alert and oriented X 3. Moves all extremities spontaneously. Psych: Normal affect. HEENT:  Normal  Neck: Supple without bruits or JVD. Lungs:  Resp regular and unlabored, CTA. Heart: RRR no s3, s4, or murmurs. Abdomen: Soft, non-tender, non-distended, BS + x 4.  Extremities: No clubbing, cyanosis or edema. DP/PT/Radials 2+ and equal bilaterally.  Accessory Clinical Findings  CBC  Recent Labs  08/31/16 0810 08/31/16 1606 09/01/16 0423  WBC 5.5 4.1 4.7  NEUTROABS 4.3  --   --   HGB 13.7 13.8 13.8  HCT 40.9 41.8 42.4  MCV 92.3 91.5 91.8  PLT 110* 119* [id number]*   Basic Metabolic Panel  Recent Labs  08/31/16 0810 08/31/16 1606 09/01/16 0423  NA  136  --  136  K 3.9  --  4.3  CL 105  --  103  CO2 24  --  27  GLUCOSE 183*  --  120*  BUN 19  --  18  CREATININE 1.03 1.05 1.02  CALCIUM 9.1  --  9.2  MG 2.2  --   --    Liver Function Tests  Recent Labs  08/31/16 0810  AST 27  ALT 22  ALKPHOS 39  BILITOT 1.0  PROT 5.6*  ALBUMIN 3.5   Thyroid Function Tests  Recent Labs  08/31/16 1606  TSH 1.430    TELE   frequent PVCs and periods of bigeminy noted on the monitor  Radiology/Studies  Dg Chest 2 View  Result Date: 08/31/2016 CLINICAL DATA:  Syncope.  Fall. EXAM: CHEST  2 VIEW COMPARISON:  01/28/2016 FINDINGS: Prior CABG. Heart and mediastinal contours are within normal limits. No focal opacities or effusions. No acute bony abnormality. IMPRESSION: No active cardiopulmonary disease. Electronically Signed   By: Rolm Baptise M.D.  On: 08/31/2016 09:07   Ct Head Wo Contrast  Result Date: 08/31/2016 CLINICAL DATA:  82 year old male blacked out and fell. Anterior right-sided neck pain. Initial encounter. EXAM: CT HEAD WITHOUT CONTRAST CT CERVICAL SPINE WITHOUT CONTRAST TECHNIQUE: Multidetector CT imaging of the head and cervical spine was performed following the standard protocol without intravenous contrast. Multiplanar CT image reconstructions of the cervical spine were also generated. COMPARISON:  12/30/2014 head CT. FINDINGS: CT HEAD FINDINGS Brain: No intracranial hemorrhage or CT evidence of large acute infarct. Remote small infarct left corona radiata/ superior left lenticular nucleus. Global atrophy without hydrocephalus. No intracranial mass lesion noted on this unenhanced exam. Vascular: Vascular calcification Skull: No skull fracture Sinuses/Orbits: No acute orbital abnormality. Mild right-sided ethmoid air cell and minimal left maxillary sinus mucosal thickening. Other: Negative CT CERVICAL SPINE FINDINGS Alignment: Minimal retrolisthesis C5. Skull base and vertebrae: No cervical spine fracture. Soft tissues and spinal  canal: No abnormal prevertebral soft tissue swelling. Cervical spondylotic changes with various degrees of spinal stenosis and foraminal narrowing most notable C4-5 thru C6-7. Upper chest: Negative. Other: Carotid bifurcation calcifications. IMPRESSION: CT HEAD No skull fracture or intracranial hemorrhage. Remote small infarct left corona radiata/ superior left lenticular nucleus. Global atrophy without hydrocephalus. CT CERVICAL SPINE No cervical spine fracture. Cervical spondylotic changes with various degrees of spinal stenosis and foraminal narrowing most notable C4-5 thru C6-7. Electronically Signed   By: Genia Del M.D.   On: 08/31/2016 09:24   Ct Cervical Spine Wo Contrast  Result Date: 08/31/2016 CLINICAL DATA:  82 year old male blacked out and fell. Anterior right-sided neck pain. Initial encounter. EXAM: CT HEAD WITHOUT CONTRAST CT CERVICAL SPINE WITHOUT CONTRAST TECHNIQUE: Multidetector CT imaging of the head and cervical spine was performed following the standard protocol without intravenous contrast. Multiplanar CT image reconstructions of the cervical spine were also generated. COMPARISON:  12/30/2014 head CT. FINDINGS: CT HEAD FINDINGS Brain: No intracranial hemorrhage or CT evidence of large acute infarct. Remote small infarct left corona radiata/ superior left lenticular nucleus. Global atrophy without hydrocephalus. No intracranial mass lesion noted on this unenhanced exam. Vascular: Vascular calcification Skull: No skull fracture Sinuses/Orbits: No acute orbital abnormality. Mild right-sided ethmoid air cell and minimal left maxillary sinus mucosal thickening. Other: Negative CT CERVICAL SPINE FINDINGS Alignment: Minimal retrolisthesis C5. Skull base and vertebrae: No cervical spine fracture. Soft tissues and spinal canal: No abnormal prevertebral soft tissue swelling. Cervical spondylotic changes with various degrees of spinal stenosis and foraminal narrowing most notable C4-5 thru C6-7.  Upper chest: Negative. Other: Carotid bifurcation calcifications. IMPRESSION: CT HEAD No skull fracture or intracranial hemorrhage. Remote small infarct left corona radiata/ superior left lenticular nucleus. Global atrophy without hydrocephalus. CT CERVICAL SPINE No cervical spine fracture. Cervical spondylotic changes with various degrees of spinal stenosis and foraminal narrowing most notable C4-5 thru C6-7. Electronically Signed   By: Genia Del M.D.   On: 08/31/2016 09:24   Echocardiogram: 08/05/15 Study Conclusions - Left ventricle: The cavity size was mildly dilated. Wall thickness was increased in a pattern of mild LVH. Systolic function was severely reduced. The estimated ejection fraction was in the range of 25% to 30%. Diffuse hypokinesis. Doppler parameters are consistent with abnormal left ventricular relaxation (grade 1 diastolic dysfunction). - Mitral valve: Severely calcified annulus. Mildly thickened leaflets . There was mild regurgitation. - Left atrium: The atrium was mildly dilated. - Atrial septum: There was an atrial septal aneurysm. Impressions: - Severe, gobal reduction in LV function (EF 30); grade 1 diastolic dysfunction;  mild LVE and LVH; sclerotic aortic valve; mild MR; mild LAE.    ASSESSMENT AND PLAN  82 year old male with a past medical history of CAD s/p CABG, dilated cardiomyopathy, renal artery stenosis s/p R stent in 01/2010, hypertension and hyperlipidemia who presented to [facility] on 08/31/16 after an episode of syncope.  1. Syncope: Presents with syncopal episode after feeling dizzy. Preceded by a sensation of dimming vision. With frequent PVCs and periods of bigeminy noted on the monitor. We will follow his telemetry and review further in the morning. He will also need a 30 day monitor discharge, which we can arrange. Orthostatics yesterday positive 132/53 HR 79 (lying)--> 120/60 HR 83--> 102/53 (HR 89).  -- He should not be driving due to  memory problems and this episode of LOC.   2. Frequent PVCs/ventricular bigeminy: Noted on telemetry. We'll continue beta blocker, currently on Coreg 12.5 twice a day. He has not had his beta blocker today. Continued ventricular ectopy last nigh despite Coreg.  3. Chronic systolic CHF/dilated cardiomyopathy: Continue Entresto and Coreg. He appears euvolemic  4. Orthostatic hypotension: would recommend compression stockings and rising slowly. He is on an alpha blocker for BPH which can also make this worse.    Signed, Angelena Form PA-C  Pager [phone]  The patient has been seen in conjunction with Nell Range, [place]. All aspects of care have been considered and discussed. The patient has been personally interviewed, examined, and all clinical data has been reviewed.   He has not had recurrent syncope.  We have identified orthostasis. His age and medication regimen likely contributing. We have recommended moderate tension knee-high support stockings.  Thirty-day continuous ambulatory monitoring needs to be completed at discharge.  Driving should discontinued for at least 6 months(syncope) and possibly indefinitely (given a recent episode of memory loss while driving).  We will continue to follow.

## 2016-09-01 NOTE — Evaluation (Signed)
Physical Therapy Evaluation Patient Details Name: Victor Waters MRN: [id number] DOB: 1925-12-24 Today's Date: 09/01/2016   History of Present Illness  Victor Waters is a 82 y.o. male presenting with syncope. PMH is significant for CAD s/p CABG 2003, cardiomyopathy with EF 25-30% and G1DD (07/2015), Renal Artery Stenosis s/p stent in 01/2010, HTN, Thrombocytopenia, Chronic Tinnitus, Depression/Anxiety, Mild Dementia.  Clinical Impression  Patient presents with mobility close to his functional baseline.  Does demonstrate fall risk on Berg balance scale 45/56 (scores less than 45 demonstrate high fall risk.)  Discussed no follow up PT right now, but to consider outpatient PT for balance if noted that it is an issue.  Feel patient safe to d/c home with family assist.  No further acute PT needs at this time.     Follow Up Recommendations No PT follow up    Equipment Recommendations  None recommended by PT    Recommendations for Other Services       Precautions / Restrictions Precautions Precautions: Fall      Mobility  Bed Mobility Overal bed mobility: Modified Independent                Transfers Overall transfer level: Modified independent               General transfer comment: UE support needed  Ambulation/Gait Ambulation/Gait assistance: Independent Ambulation Distance (Feet): 250 Feet Assistive device: None Gait Pattern/deviations: Step-through pattern;Decreased stride length;Trunk flexed     General Gait Details: mildly unsteady, but no LOB  Stairs Stairs: Yes Stairs assistance: Min guard Stair Management: Step to pattern;Alternating pattern;One rail Right Number of Stairs: 5 General stair comments: uses railing, initially step to, then step through pattern  Wheelchair Mobility    Modified Rankin (Stroke Patients Only)       Balance Overall balance assessment: Needs assistance   Sitting balance-Leahy Scale: Good     Standing balance  support: No upper extremity supported Standing balance-Leahy Scale: Good Standing balance comment: see BERG; educated pt/daughter on use of cane for uneven surfaces and curbs and to have someone with him while walking the dog for safety                 Standardized Balance Assessment Standardized Balance Assessment : Berg Balance Test Berg Balance Test Sit to Stand: Able to stand  independently using hands Standing Unsupported: Able to stand safely 2 minutes Sitting with Back Unsupported but Feet Supported on Floor or Stool: Able to sit safely and securely 2 minutes Stand to Sit: Sits safely with minimal use of hands Transfers: Able to transfer safely, definite need of hands Standing Unsupported with Eyes Closed: Able to stand 10 seconds safely Standing Ubsupported with Feet Together: Able to place feet together independently and stand 1 minute safely From Standing, Reach Forward with Outstretched Arm: Can reach forward >12 cm safely (5") From Standing Position, Pick up Object from Floor: Able to pick up shoe safely and easily From Standing Position, Turn to Look Behind Over each Shoulder: Looks behind from both sides and weight shifts well Turn 360 Degrees: Able to turn 360 degrees safely one side only in 4 seconds or less Standing Unsupported, Alternately Place Feet on Step/Stool: Able to complete >2 steps/needs minimal assist Standing Unsupported, One Foot in Front: Able to plae foot ahead of the other independently and hold 30 seconds Standing on One Leg: Tries to lift leg/unable to hold 3 seconds but remains standing independently Total Score: 45  Pertinent Vitals/Pain Pain Assessment: No/denies pain    Home Living Family/patient expects to be discharged to:: Private residence Living Arrangements: Spouse/significant other;Other relatives Available Help at Discharge: Family;Available 24 hours/day Type of Home: House Home Access: Stairs to enter Entrance  Stairs-Rails: Psychiatric nurse of Steps: 5 Home Layout: One level Home Equipment: Cane - single point;Grab bars - tub/shower;Shower seat Additional Comments: thinks shower seat might be old    Prior Function Level of Independence: Independent         Comments: walks dog, does dishes, cooks, runs vacuum, was driving until this episode (states MD said no driving)     Hand Dominance   Dominant Hand: Right    Extremity/Trunk Assessment   Upper Extremity Assessment: Overall WFL for tasks assessed           Lower Extremity Assessment: Overall WFL for tasks assessed         Communication   Communication: No difficulties  Cognition Arousal/Alertness: Awake/alert Behavior During Therapy: WFL for tasks assessed/performed Overall Cognitive Status: Within Functional Limits for tasks assessed                      General Comments      Exercises        Assessment/Plan    PT Assessment Patent does not need any further PT services  PT Diagnosis Abnormality of gait   PT Problem List    PT Treatment Interventions     PT Goals (Current goals can be found in the Care Plan section) Acute Rehab PT Goals Patient Stated Goal: To go home PT Goal Formulation: All assessment and education complete, DC therapy    Frequency     Barriers to discharge        Co-evaluation               End of Session Equipment Utilized During Treatment: Gait belt Activity Tolerance: Patient tolerated treatment well Patient left: in bed;with call bell/phone within reach;with family/visitor present      Functional Assessment Tool Used: Clinical Judgement Functional Limitation: Mobility: Walking and moving around Mobility: Walking and Moving Around Current Status [id number]): At least 1 percent but less than 20 percent impaired, limited or restricted Mobility: Walking and Moving Around Goal Status [phone]): At least 1 percent but less than 20 percent impaired,  limited or restricted Mobility: Walking and Moving Around Discharge Status [phone]): At least 1 percent but less than 20 percent impaired, limited or restricted    Time: 1320-1347 PT Time Calculation (min) (ACUTE ONLY): 27 min   Charges:   PT Evaluation $PT Eval Moderate Complexity: 1 Procedure PT Treatments $Gait Training: 8-22 mins   PT G Codes:   PT G-Codes **NOT FOR INPATIENT CLASS** Functional Assessment Tool Used: Clinical Judgement Functional Limitation: Mobility: Walking and moving around Mobility: Walking and Moving Around Current Status [id number]): At least 1 percent but less than 20 percent impaired, limited or restricted Mobility: Walking and Moving Around Goal Status [phone]): At least 1 percent but less than 20 percent impaired, limited or restricted Mobility: Walking and Moving Around Discharge Status [phone]): At least 1 percent but less than 20 percent impaired, limited or restricted    Reginia Naas 09/01/2016, 2:04 PM Magda Kiel, [place] 09/01/2016

## 2016-09-01 NOTE — Discharge Instructions (Signed)
Victor Waters was admitted for symptoms of syncope and dizziness. There were concerns the source may be related to either his brain or heart. Imaging of the head did not show a stroke or mass. An EEG was performed to evaluate for seizure activity. This was read by neurology as normal. Cardiology was consulted during hospitalization. EKG showed normal electrical heart conduction. He did show some orthostatics where his blood pressure dropped when standing. This was thought to be the source. Medications were adjusted to prevent worsening of this. Physical therapy did not see need for follow up. We discussed neurology evaluation but agreed patient was stable enough to be discharged.

## 2016-09-01 NOTE — Progress Notes (Signed)
Family Medicine Teaching Service Daily Progress Note Intern Pager: [phone]  Patient name: Victor Waters Medical record number: [id number] Date of birth: [birth year] Age: 82 y.o. Gender: male  Primary Care Provider: Lamar Blinks, MD Consultants: Cardiology Code Status: FULL  Pt Overview and Major Events to Date:  9/12: Admitted for syncope (cardiac vs seizures)  Assessment and Plan: Victor Waters is a 82 y.o. male presenting with syncope. PMH is significant for CAD s/p CABG 2003, cardiomyopathy with EF 25-30% and G1DD (07/2015), Renal Artery Stenosis s/p stent in 01/2010, HTN, Thrombocytopenia, Chronic Tinnitus, Depression/Anxiety, Mild Dementia.  1. Syncope with Dizziness:  Differentials include cardiac related vs possible seizure. Possibly cardiac related (arrythmia) although patient denied palpitations during this episode. He has multiple PVC on telemetry while in the ED.  Patient has had history of lightheadedness/palpitations and found to have ventricular ectopy during an admission in 12/2014; he was also found to have trigeminal rhythm during another admission in 12/2014. He had a Holter monitor in 01/2015 which showed frequent ventricular ectopy. He has history of CAD with history of MI s/p CABG in 2003. His most recent stress rest in 07/2015 was normal and low risk. EKG in ED with NSR with first degree heart block (noted in past EKG as well) and no signs of ischemia; additionally I stat troponin was 0.00. No chest pain/sob/n/v/diaphoresis. Unlikely ACS. Seizure should be considered as patient did have urinary incontinence during episode, possible abnormal movement during episode and was found to have a bruise on his tongue which is new. TIA/CVA also considered in differential. Neurological exam is unremarkable/nonfocal. CT head and neck without acute findings. Neurology read EEG which appeared normal upon their interpretation. Appreciate neurology input. Pos orthostatics, cardiology recs pt  is not to drive. - Cardiology consulted and following, appreciate recommendations - Continue orthostatic vitals (pos drop systolic 30, increase diastolic 13) - Home metoprolol held - Compression socks and d/c Flomax per cards recs  2. CAD; Hx of MI s/p CABG in 2003: given ASA 325mg  in ED.  - Continue ASA 81mg  daily - Continue Zetia (per notes patient intolerant of statin)   3. Cardiomyopathy: ECHO in 07/2015 with EF 25-30% and G1DD. Per chart review, Furosemide 20mg  PRN for swelling; patient has not used since march/april of 2017. Clinically not in acute exacerbation; only very mild trace pitting edema of bilateral lower extremities. Weight seems to be at baseline. BNP 114.3 on admit. CXR without congestion.  - Continue Coreg 6.25 mg BID - Continue Entresto BID - Consider adding spironolactone for low EF per cardiology rec - Will hold PRN Lasix for now - ECHO pending  4. Thrombocytopenia: chronic thrombocytosis. No signs of bleeding during admission. - Continue to monitor with labs  5. History of Falls: 3 falls which sound mechanical over the past year.  - Consider PT evaluation after evaluated by cardiology  FEN/GI: heart healthy diet Prophylaxis: Lovenox   Disposition: pending d/c while patient is undergoing further evaluation for syncope  Subjective:  Patient awake sitting up in bed able to have conversation accompanied by daughter. Complains of "brain pain" in the frontal region but has improved. No new complaints of CP, SOB, lightheadedness. Blurred vision resolved and pt denies h/o LOC or black out.    Objective: Temp:  [97.8 F (36.6 C)-98.6 F (37 C)] 98.2 F (36.8 C) (09/13 0317) Pulse Rate:  [70-85] 85 (09/13 0317) Resp:  [11-16] 13 (09/13 0317) BP: (120-156)/(52-114) 156/73 (09/13 0317) SpO2:  [95 %-100 %] 97 % (09/13  [id number]) Weight:  [141 lb 3.2 oz (64 kg)-146 lb (66.2 kg)] 141 lb 3.2 oz (64 kg) (09/13 0317) Physical Exam: General: well nourished, well  developed, in no acute distress with non-toxic appearance HEENT: normocephalic, atraumatic, moist mucous membranes, small tongue laceration Neck: supple, non-tender without lymphadenopathy CV: regular rate and rhythm without murmurs, rubs, or gallops Lungs: clear to auscultation bilaterally with normal work of breathing Abdomen: soft, non-tender, no masses or organomegaly palpable, normoactive bowel sounds Skin: warm, dry, no rashes or lesions, cap refill < 2 seconds Extremities: warm and well perfused, normal tone  Laboratory:  Recent Labs Lab 08/31/16 0810 08/31/16 1606 09/01/16 0423  WBC 5.5 4.1 4.7  HGB 13.7 13.8 13.8  HCT 40.9 41.8 42.4  PLT 110* 119* 124*    Recent Labs Lab 08/31/16 0810 08/31/16 1606 09/01/16 0423  NA 136  --  136  K 3.9  --  4.3  CL 105  --  103  CO2 24  --  27  BUN 19  --  18  CREATININE 1.03 1.05 1.02  CALCIUM 9.1  --  9.2  PROT 5.6*  --   --   BILITOT 1.0  --   --   ALKPHOS 39  --   --   ALT 22  --   --   AST 27  --   --   GLUCOSE 183*  --  120*   Mg:  2.2 BNP:  114.3 (no previous on record) Troponin-I: 0.00 UA:  Neg UCx:  Pending TSH:  1.430  Imaging/Diagnostic Tests: CXR: FINDINGS: Prior CABG. Heart and mediastinal contours are within normal limits. No focal opacities or effusions. No acute bony abnormality. IMPRESSION: No active cardiopulmonary disease.  CT Head and C-spine:  FINDINGS: CT HEAD FINDINGS Brain: No intracranial hemorrhage or CT evidence of large acute infarct. Remote small infarct left corona radiata/ superior left lenticular nucleus. Global atrophy without hydrocephalus. No intracranial mass lesion noted on this unenhanced exam. Vascular: Vascular calcification Skull: No skull fracture Sinuses/Orbits: No acute orbital abnormality. Mild right-sided ethmoid air cell and minimal left maxillary sinus mucosal thickening. Other: Negative CT CERVICAL SPINE FINDINGS Alignment: Minimal retrolisthesis  C5. Skull base and vertebrae: No cervical spine fracture. Soft tissues and spinal canal: No abnormal prevertebral soft tissue swelling. Cervical spondylotic changes with various degrees of spinal stenosis and foraminal narrowing most notable C4-5 thru C6-7. Upper chest: Negative. Other: Carotid bifurcation calcifications. IMPRESSION: CT HEAD No skull fracture or intracranial hemorrhage. Remote small infarct left corona radiata/ superior left lenticular nucleus. Global atrophy without hydrocephalus. CT CERVICAL SPINE No cervical spine fracture. Cervical spondylotic changes with various degrees of spinal stenosis and foraminal narrowing most notable C4-5 thru C6-7.  ECHO: Pending   [place] Bing, DO 09/01/2016, 7:08 AM PGY-1, [place] Intern pager: [phone], text pages welcome

## 2016-09-01 NOTE — Discharge Summary (Signed)
[facility] Discharge Summary  Patient name: Victor Waters Medical record number: [id number] Date of birth: 1926-03-12 Age: 82 y.o. Gender: male Date of Admission: 08/31/2016  Date of Discharge: 09/01/16 Admitting Physician: Blane Ohara McDiarmid, MD  Primary Care Provider: Lamar Blinks, MD Consultants: Cardiology  Indication for Hospitalization: Syncope  Discharge Diagnoses/Problem List:  Syncope CAD Cardiomyopathy EF 25-30% with G1DD  Renal Artery Stenosis Thrombocytopenia Mild Dementia  Disposition: home with daughter and wife  Discharge Condition: stable, improved  Discharge Exam:  General: well nourished, well developed, in no acute distress with non-toxic appearance HEENT: normocephalic, atraumatic, moist mucous membranes, small tongue laceration Neck: supple, non-tender without lymphadenopathy CV: regular rate and rhythm without murmurs, rubs, or gallops Lungs: clear to auscultation bilaterally with normal work of breathing Abdomen: soft, non-tender, no masses or organomegaly palpable, normoactive bowel sounds Skin: warm, dry, no rashes or lesions, cap refill < 2 seconds Extremities: warm and well perfused, normal tone  Brief Hospital Course:  Victor Cole Usheris a 82 y.o.malepresenting with syncope. PMH is significant for CAD s/p CABG 2003, cardiomyopathy with EF 25-30% and G1DD (07/2015), Renal Artery Stenosis s/p stent in 01/2010, HTN, Thrombocytopenia, Chronic Tinnitus, Depression/Anxiety, Mild Dementia.  Initial differential included cardiac vs possible seizure. Cardiology was consulted and followed during hospital course. Patient denied palpitations during episode. EKG showed NSR with first heart block (consistent with previous EKG). Troponin was neg. Patient denied symptoms of CP, SOB, palpitations, n/v, or diaphoresis. ECHO showed no changes from previous on 07/2015 (G2DD with EF 30-35%). Orthostatics were positive with a drop of systolic  from [id number] to [id number] from laying to standing. This is likely the source of the syncope. The history did not sound consistent with seizures. CT of head and neck was neg. EEG was read neg by neurology. This was discussed with the patient and his daughter who were in agreement this did not warrant further neurological evaluation.   Medications adjustments include discontinuing Flomax and beta blocker, decreased coreg to 1/2 home dose. Patient needs compression stocking upon PCP f/u. Follow up with cardiology for 30 day monitor was scheduled. Patient is not to drive due to orthostatics. This was discussed with patient and daughter. Patient was seen by physical therapy prior to discharge who did not see need for PT. Patient could consider neuro balance evaluation outpatient if symptoms return. I discussed need for dementia follow up with daughter.  Issues for Follow Up:  1. F/u with PCP for syncopal episode 2. Discontinuing Flomax and beta blocker, decreased coreg to 1/2 home dose 3. Patient needs compression stocking upon PCP f/u 4. Follow up with cardiology for 30 day monitor 5. Consider neuro balance evaluation outpatient if symptoms return 6. Do not allow patient to drive for 6 months 7. Patient needs f/u for dementia  Significant Procedures: None  Significant Labs and Imaging:   Recent Labs Lab 08/31/16 0810 08/31/16 1606 09/01/16 0423  WBC 5.5 4.1 4.7  HGB 13.7 13.8 13.8  HCT 40.9 41.8 42.4  PLT 110* 119* 124*    Recent Labs Lab 08/31/16 0810 08/31/16 1606 09/01/16 0423  NA 136  --  136  K 3.9  --  4.3  CL 105  --  103  CO2 24  --  27  GLUCOSE 183*  --  120*  BUN 19  --  18  CREATININE 1.03 1.05 1.02  CALCIUM 9.1  --  9.2  MG 2.2  --   --   ALKPHOS 39  --   --  AST 27  --   --   ALT 22  --   --   ALBUMIN 3.5  --   --    Mg:  2.2 BNP:  114.3 (no previous on record) Troponin-I: 0.00 UA:  Neg TSH:  1.430  Results/Tests Pending at Time of Discharge: None  Discharge  Medications:    Medication List    STOP taking these medications   tamsulosin 0.4 MG Caps capsule Commonly known as:  FLOMAX     TAKE these medications   aspirin 81 MG tablet Take 81 mg by mouth at bedtime.   CALCIUM-MAGNESIUM-ZINC PO Take 1 tablet by mouth daily.   carvedilol 3.125 MG tablet Commonly known as:  COREG Take 1 tablet (3.125 mg total) by mouth 2 (two) times daily with a meal. What changed:  See the new instructions.   cholecalciferol 1000 units tablet Commonly known as:  VITAMIN D Take 2,000 Units by mouth daily.   ezetimibe 10 MG tablet Commonly known as:  ZETIA Take 0.5 tablets (5 mg total) by mouth daily.   Melatonin 3 MG Tbdp Take 1 tablet by mouth at bedtime as needed (sleep). Reported on 04/29/2016   nitroGLYCERIN 0.4 MG SL tablet Commonly known as:  NITROSTAT DISSOLVE 1 TAB UNDER TONGUE AS NEEDED FOR CHEST PAIN, MAY REPEAT IN 5 MINUTES AS DIRECTED. ER IF NO BETTER.   sacubitril-valsartan 49-51 MG Commonly known as:  ENTRESTO Take 1 tablet by mouth 2 (two) times daily.       Discharge Instructions: Please refer to Patient Instructions section of EMR for full details.  Patient was counseled important signs and symptoms that should prompt return to medical care, changes in medications, dietary instructions, activity restrictions, and follow up appointments.   Follow-Up Appointments: Follow-up Information    [facility] .   Why:  The office will call you to set up a heart monitor Contact information: [place] [phone] [place], [place]. Call on 09/01/2016.   Specialty:  Family Medicine Why:  Please call for hospital follow up within 1 week upon discharge. Contact information: [address] [place] [address] [phone]           David J McMullen, DO 09/01/2016, 3:21 PM PGY-1, [place]

## 2016-09-02 ENCOUNTER — Telehealth: Payer: Self-pay | Admitting: Behavioral Health

## 2016-09-02 NOTE — Telephone Encounter (Signed)
Unable to reach patient at time of TCM/Hospital Follow-up call. Left message for patient to return call when available.  

## 2016-09-06 ENCOUNTER — Ambulatory Visit (INDEPENDENT_AMBULATORY_CARE_PROVIDER_SITE_OTHER): Payer: Commercial Managed Care - HMO | Admitting: Family Medicine

## 2016-09-06 ENCOUNTER — Encounter: Payer: Self-pay | Admitting: Family Medicine

## 2016-09-06 VITALS — BP 114/64 | HR 63 | Temp 97.9°F | Ht 63.0 in | Wt 147.0 lb

## 2016-09-06 DIAGNOSIS — Z09 Encounter for follow-up examination after completed treatment for conditions other than malignant neoplasm: Secondary | ICD-10-CM

## 2016-09-06 DIAGNOSIS — I951 Orthostatic hypotension: Secondary | ICD-10-CM | POA: Diagnosis not present

## 2016-09-06 DIAGNOSIS — R55 Syncope and collapse: Secondary | ICD-10-CM

## 2016-09-06 NOTE — Patient Instructions (Addendum)
It was very nice to see you today- I am sorry that you fainted!  Your blood pressure looks good today.   I am going to send a message to your heart doctor to ask them for a follow-up appointment for you Please consider adding compression sock or stockings- you can put them on the morning and take off at bedtime. These may help to keep your blood pressure from dropping!

## 2016-09-06 NOTE — Telephone Encounter (Signed)
Left message x 2 for TCM/Hospital Follow-up call. 

## 2016-09-06 NOTE — Progress Notes (Signed)
[facility] [address] [place], [place] [address] [phone]  Date:  09/06/2016   Name:  Victor Waters   DOB:  07-27-1926   MRN:  [id number]  PCP:  Lamar Blinks, MD    Chief Complaint: Hospitalization Follow-up (Pt was seen in the ER on 08/31/2016 for syncope and collapse. Doing well since ER visit no more episodes of syncope or collapse. )   History of Present Illness:  Victor Waters is a 82 y.o. very pleasant male patient who presents with the following:  He was recently admitted overnight from 9/12 to 9/13 with syncope.  He also has a history of CAD, cardiomyopathy, RAS, thrombocytopenia and mild dementia. He sees Dr. Percival Spanish for his cardiac care and is also a neurology pt - Dr. Delice Lesch- for dementia/ memory loss His syncope was thought due to orthostatic hypotension. His medications were adjusted- stop flomax and BB, decreased coreg. Since he got home he is "feeling fine."   His daughter [name] reports that he is doing better and having no problems at home so far. They are having him change position slowly to help prevent dizziness.  They have not yet started him with compression stockings.   He is eating well.   He notes a mild headache today only No CP, no SOB.    CT head and neck while inpt: IMPRESSION: CT HEAD  No skull fracture or intracranial hemorrhage.  Remote small infarct left corona radiata/ superior left lenticular nucleus.  Global atrophy without hydrocephalus.  CT CERVICAL SPINE  No cervical spine fracture.  Cervical spondylotic changes with various degrees of spinal stenosis and foraminal narrowing most notable C4-5 thru C6-7.  BP Readings from Last 3 Encounters:  09/06/16 114/64  09/01/16 (!) 109/59  08/10/16 (!) 120/58   Wt Readings from Last 3 Encounters:  09/06/16 147 lb (66.7 kg)  09/01/16 141 lb 3.2 oz (64 kg)  08/10/16 146 lb (66.2 kg)     Patient Active  Problem List   Diagnosis Date Noted  . Syncope and collapse 08/31/2016  . Head contusion   . Tongue injury   . Chronic systolic heart failure ([place])   . Cerebral infarction, remote, resolved   . Mild dementia 08/10/2016  . Viral URI with cough 01/31/2016  . Sleep disturbance 01/06/2016  . Loose stools 01/06/2016  . Ventricular ectopy 12/31/2014  . Acute confusional state 12/31/2014  . Weakness 12/23/2014  . Palpitations   . Nocturia 09/20/2013  . Hyponatremia 09/29/2012  . Fatigue 05/09/2012  . PERSONAL HISTORY OTHER DISORDER URINARY SYSTEM 12/22/2010  . Thrombocytopenia ([place]) 08/04/2010  . LEUKOCYTOPENIA UNSPECIFIED 08/04/2010  . TINNITUS, CHRONIC 07/06/2010  . RENAL ARTERY STENOSIS 11/25/2009  . RENAL INSUFFICIENCY 11/25/2009  . HYPOTENSION 11/01/2009  . DYSPNEA 09/24/2009  . BRADYCARDIA 09/04/2009  . HYPOTENSION-ORTHOSTATIC 09/04/2009  . DIZZINESS 09/04/2009  . HYPERLIPIDEMIA 01/31/2008  . DEPRESSIVE DISORDER 01/23/2008  . Essential hypertension 01/23/2008  . ANXIETY 06/02/2007  . Coronary atherosclerosis 06/02/2007  . INSOMNIA 06/02/2007    Past Medical History:  Diagnosis Date  . Anxiety   . Coronary artery disease    a. s/p CABG;  b. LHC (2/11):  LM 30-40, pLAD 95-99 then 80 and 90, mCFX 90, pRCA occluded, S-dRCA ok, S-D1 ok, S-OM patent with 50, L-LAD ok.  Med Rx recommended.  c.  Myoview (5/13):  Low risk with small fixed inf defect c/w scar, no ischemia, EF 48%.  d.  Echo (6/13):  EF 55-60%, inf HK, Gr 1 DD, MAC;  e. Lexiscan Myoview (10/14):  Low risk, EF 38%, inferobasal infarct, no ischemia.  . Fatigue   . Hx of echocardiogram    Echo (10/14):  EF 55-60%, Gr 1DD, MAC, mild MR, mild LAE.  Marland Kitchen Hyperlipidemia   . Hypertension   . Hyponatremia 09/2012   Associated with postural hypotension and confusion  . Insomnia   . Myocardial infarction ([place]) 2003   hx of   . Renal artery stenosis ([facility])    bilateral;  s/p R RA stent 2011  . Syncope and collapse  08/31/2016  . Systolic heart failure ([place])    CHRONIC    Past Surgical History:  Procedure Laterality Date  . CARDIAC CATHETERIZATION  2003   revdeling severe three-vessel disease  . CORONARY ARTERY BYPASS GRAFT     LIMA to LAD, SVG to diagonal, SVG to RCA, and SVG to circumflex  . POLYPECTOMY     colon  . RENAL ARTERY STENT  01-28-10    Social History  Substance Use Topics  . Smoking status: Never Smoker  . Smokeless tobacco: Never Used  . Alcohol use No    Family History  Problem Relation Age of Onset  . Diabetes Mother   . Stroke Mother   . Heart attack Father 68    died  . Colon cancer Other     paternal grandfather    Allergies  Allergen Reactions  . Lisinopril Cough    Renal artery stenosis by history; ACE inhibitor  would be relatively contraindicated  . Zithromax [Azithromycin]     02/15/14 N&V  . Niacin Other (See Comments)    Burning sensations in head    Medication list has been reviewed and updated.  Current Outpatient Prescriptions on File Prior to Visit  Medication Sig Dispense Refill  . aspirin 81 MG tablet Take 81 mg by mouth at bedtime.     Marland Kitchen CALCIUM-MAGNESIUM-ZINC PO Take 1 tablet by mouth daily.    . carvedilol (COREG) 3.125 MG tablet Take 1 tablet (3.125 mg total) by mouth 2 (two) times daily with a meal. 30 tablet 0  . cholecalciferol (VITAMIN D) 1000 UNITS tablet Take 2,000 Units by mouth daily.    Marland Kitchen ezetimibe (ZETIA) 10 MG tablet Take 0.5 tablets (5 mg total) by mouth daily. 45 tablet 3  . Melatonin 3 MG TBDP Take 1 tablet by mouth at bedtime as needed (sleep). Reported on 04/29/2016    . nitroGLYCERIN (NITROSTAT) 0.4 MG SL tablet DISSOLVE 1 TAB UNDER TONGUE AS NEEDED FOR CHEST PAIN, MAY REPEAT IN 5 MINUTES AS DIRECTED. ER IF NO BETTER. 30 tablet 0  . sacubitril-valsartan (ENTRESTO) 49-51 MG Take 1 tablet by mouth 2 (two) times daily. 60 tablet 9   No current facility-administered medications on file prior to visit.     Review of  Systems:  As per HPI- otherwise negative.   Physical Examination: Vitals:   09/06/16 1320  BP: 114/64  Pulse: 63  Temp: 97.9 F (36.6 C)   Vitals:   09/06/16 1320  Weight: 147 lb (66.7 kg)  Height: 5\' 3"  (1.6 m)   Body mass index is 26.04 kg/m. Ideal Body Weight: Weight in (lb) to have BMI = 25: 140.8  GEN: WDWN, NAD, Non-toxic, A & O x 3, well appearing older man HEENT: Atraumatic, Normocephalic. Neck supple. No masses, No LAD. Ears and Nose: No external deformity. CV: RRR, No M/G/R. No JVD. No thrill. No  extra heart sounds. PULM: CTA B, no wheezes, crackles, rhonchi. No retractions. No resp. distress. No accessory muscle use. ABD: S, NT, ND EXTR: No c/c/e NEURO Normal gait. Normal strength of all limbs  PSYCH: Normally interactive. Conversant. Not depressed or anxious appearing.  Calm demeanor.    Assessment and Plan: Hospital discharge follow-up  Orthostatic hypotension  Syncope and collapse  Here today for hospital follow-up; due to apparent syncope due to orthostatic hypotension. His medications were adjusted and he is feeling better.  CT of head and neck were negative for any acute disease.   Message to his cardiologist to schedule a followup appt He notes a mild HA today- asked him to please seek care if this does not resolve or if it gets worse and he agrees   Signed Lamar Blinks, MD

## 2016-09-08 ENCOUNTER — Telehealth: Payer: Self-pay | Admitting: Cardiology

## 2016-09-16 ENCOUNTER — Telehealth: Payer: Self-pay | Admitting: Cardiology

## 2016-09-16 ENCOUNTER — Ambulatory Visit: Payer: Commercial Managed Care - HMO | Admitting: Physician Assistant

## 2016-09-16 NOTE — Telephone Encounter (Signed)
New message ° ° ° ° °Talk to a nurse---pt would not tell me what she wanted °

## 2016-09-16 NOTE — Telephone Encounter (Signed)
Spoke to wife. She wanted to know if Dr Percival Spanish was aware that patient had an order for 30 day event monitor.  RN informed wife, Cardiology was consulted, it recommend to see if any irregular heartbeat is the cause of patient problems.  wife verbalized understanding and transferred to scheduler to set up appointment.

## 2016-09-28 ENCOUNTER — Ambulatory Visit (INDEPENDENT_AMBULATORY_CARE_PROVIDER_SITE_OTHER): Payer: Commercial Managed Care - HMO

## 2016-09-28 DIAGNOSIS — R55 Syncope and collapse: Secondary | ICD-10-CM | POA: Diagnosis not present

## 2016-10-20 NOTE — Telephone Encounter (Signed)
Closed encounter °

## 2016-11-24 ENCOUNTER — Telehealth: Payer: Self-pay | Admitting: Family Medicine

## 2016-11-24 ENCOUNTER — Other Ambulatory Visit: Payer: Self-pay | Admitting: Emergency Medicine

## 2016-11-24 NOTE — Telephone Encounter (Signed)
Patient's spouse called stating that patient went to get refill of his medication but the pharmacy stated that the prescriptions needed to be updated since he is now seeing Dr. Lorelei Pont. Medications are; ezetimibe (ZETIA) 10 MG tablet, TAMSULOSIN 4MG , carvedilol (COREG) 3.125 MG tablet. Please advise.    Pharmacy: [place], [place]  Patient phone: [phone]

## 2016-11-24 NOTE — Telephone Encounter (Signed)
Tried to contact pt to find out if he would like refills sent to [facility] or [facility]. Left voicemail for pt to return call.

## 2016-11-25 ENCOUNTER — Other Ambulatory Visit: Payer: Self-pay | Admitting: Emergency Medicine

## 2016-11-25 MED ORDER — EZETIMIBE 10 MG PO TABS: 5.0000 mg | ORAL_TABLET | Freq: Every day | ORAL | 3 refills | Status: DC

## 2016-11-25 MED ORDER — CARVEDILOL 3.125 MG PO TABS: 3.1250 mg | ORAL_TABLET | Freq: Two times a day (BID) | ORAL | 3 refills | Status: DC

## 2016-11-25 NOTE — Telephone Encounter (Signed)
Received refill request for ezetimibe (last filled 10/30/2015), tamsulosin (no longer on list, removed 09/01/16) and carvedilol (last refilled 09/01/16). Last office visit 09/06/16. Is it ok to refill? Please advise.

## 2016-11-29 NOTE — Telephone Encounter (Signed)
Wife left msg on [facility] triage stating she was giving nurse a call back. Per chart pt see Dr. Lorelei Pont. Forwarding msg to her CMA...Johny Chess

## 2016-11-30 NOTE — Telephone Encounter (Signed)
Called and spoke to pt's wife. Informed her that refills have been sent to [facility].

## 2017-02-07 ENCOUNTER — Ambulatory Visit: Payer: Commercial Managed Care - HMO | Admitting: Neurology

## 2017-02-07 DIAGNOSIS — Z029 Encounter for administrative examinations, unspecified: Secondary | ICD-10-CM

## 2017-02-16 ENCOUNTER — Encounter: Payer: Self-pay | Admitting: Neurology

## 2017-03-29 ENCOUNTER — Other Ambulatory Visit (INDEPENDENT_AMBULATORY_CARE_PROVIDER_SITE_OTHER): Payer: Medicare HMO

## 2017-03-29 ENCOUNTER — Other Ambulatory Visit: Payer: Self-pay | Admitting: Family Medicine

## 2017-03-29 ENCOUNTER — Telehealth: Payer: Self-pay

## 2017-03-29 DIAGNOSIS — R300 Dysuria: Secondary | ICD-10-CM | POA: Diagnosis not present

## 2017-03-29 LAB — URINALYSIS, ROUTINE W REFLEX MICROSCOPIC
Bilirubin Urine: NEGATIVE
KETONES UR: NEGATIVE
LEUKOCYTES UA: NEGATIVE
NITRITE: NEGATIVE
Specific Gravity, Urine: 1.02 (ref 1.000–1.030)
Total Protein, Urine: NEGATIVE
UROBILINOGEN UA: 0.2 (ref 0.0–1.0)
Urine Glucose: NEGATIVE
pH: 5.5 (ref 5.0–8.0)

## 2017-03-29 NOTE — Telephone Encounter (Signed)
ok 

## 2017-03-29 NOTE — Telephone Encounter (Signed)
Orders place. Called the wife informed orders place/made appt for today

## 2017-03-29 NOTE — Telephone Encounter (Signed)
(  Doc of the day)  Pt has burning when he urinates. Pt wants orders placed to have his urine ran to see if it is a UTI.Marland Kitchen   Please advise  Call back number [phone]

## 2017-03-30 LAB — URINE CULTURE: ORGANISM ID, BACTERIA: NO GROWTH

## 2017-03-31 ENCOUNTER — Telehealth: Payer: Self-pay | Admitting: Family Medicine

## 2017-03-31 NOTE — Telephone Encounter (Signed)
Wife was informed on Tuesday evening of his UA results/ But Culture was pending/Please review for PCP. The wife stated he is burning

## 2017-03-31 NOTE — Telephone Encounter (Signed)
Called pt back- I last saw him in September and have not yet evaluated him for dysuria. He states he has noted dysuria for the last 2-3 weeks.  Offered to try and see him tomorrow but he is going on a camping trip- he wants to wait until Monday. Asked staff to schedule him

## 2017-03-31 NOTE — Telephone Encounter (Signed)
Notify urine culture negative, if he is still hurting increase hydration and try some Azo prn if symptoms persist we can repeat UA and culture to make sure we did not miss an early infection

## 2017-03-31 NOTE — Telephone Encounter (Signed)
Caller name: Mariann Laster Relationship to patient: Wife Can be reached: [phone]  Pharmacy:  Reason for call: Wife states patient is still having problems. States he is still burning when he urinates. Plse adv

## 2017-04-01 NOTE — Telephone Encounter (Signed)
Called informed the patient of PCP instructions. He verbalized understanding. 

## 2017-04-04 ENCOUNTER — Emergency Department (HOSPITAL_BASED_OUTPATIENT_CLINIC_OR_DEPARTMENT_OTHER): Payer: Medicare HMO

## 2017-04-04 ENCOUNTER — Observation Stay (HOSPITAL_COMMUNITY): Payer: Medicare HMO

## 2017-04-04 ENCOUNTER — Encounter (HOSPITAL_BASED_OUTPATIENT_CLINIC_OR_DEPARTMENT_OTHER): Payer: Self-pay | Admitting: *Deleted

## 2017-04-04 ENCOUNTER — Ambulatory Visit (INDEPENDENT_AMBULATORY_CARE_PROVIDER_SITE_OTHER): Payer: Medicare HMO | Admitting: Family Medicine

## 2017-04-04 ENCOUNTER — Observation Stay (HOSPITAL_BASED_OUTPATIENT_CLINIC_OR_DEPARTMENT_OTHER)
Admission: EM | Admit: 2017-04-04 | Discharge: 2017-04-05 | Disposition: A | Discharge status: Home / Self Care | Payer: Medicare HMO | Attending: Nephrology | Admitting: Nephrology

## 2017-04-04 VITALS — BP 124/82 | HR 88 | Temp 97.5°F | Ht 63.0 in | Wt 142.2 lb

## 2017-04-04 DIAGNOSIS — Z5181 Encounter for therapeutic drug level monitoring: Secondary | ICD-10-CM | POA: Diagnosis not present

## 2017-04-04 DIAGNOSIS — I110 Hypertensive heart disease with heart failure: Secondary | ICD-10-CM | POA: Diagnosis not present

## 2017-04-04 DIAGNOSIS — M7989 Other specified soft tissue disorders: Secondary | ICD-10-CM | POA: Diagnosis not present

## 2017-04-04 DIAGNOSIS — G4700 Insomnia, unspecified: Secondary | ICD-10-CM | POA: Diagnosis present

## 2017-04-04 DIAGNOSIS — R29810 Facial weakness: Secondary | ICD-10-CM | POA: Diagnosis not present

## 2017-04-04 DIAGNOSIS — E785 Hyperlipidemia, unspecified: Secondary | ICD-10-CM | POA: Diagnosis not present

## 2017-04-04 DIAGNOSIS — Z7982 Long term (current) use of aspirin: Secondary | ICD-10-CM | POA: Diagnosis not present

## 2017-04-04 DIAGNOSIS — R410 Disorientation, unspecified: Secondary | ICD-10-CM | POA: Diagnosis not present

## 2017-04-04 DIAGNOSIS — I10 Essential (primary) hypertension: Secondary | ICD-10-CM | POA: Diagnosis present

## 2017-04-04 DIAGNOSIS — N401 Enlarged prostate with lower urinary tract symptoms: Secondary | ICD-10-CM

## 2017-04-04 DIAGNOSIS — Z79899 Other long term (current) drug therapy: Secondary | ICD-10-CM | POA: Insufficient documentation

## 2017-04-04 DIAGNOSIS — I472 Ventricular tachycardia: Secondary | ICD-10-CM | POA: Insufficient documentation

## 2017-04-04 DIAGNOSIS — I252 Old myocardial infarction: Secondary | ICD-10-CM | POA: Diagnosis not present

## 2017-04-04 DIAGNOSIS — R479 Unspecified speech disturbances: Secondary | ICD-10-CM | POA: Insufficient documentation

## 2017-04-04 DIAGNOSIS — R413 Other amnesia: Secondary | ICD-10-CM

## 2017-04-04 DIAGNOSIS — R3912 Poor urinary stream: Secondary | ICD-10-CM

## 2017-04-04 DIAGNOSIS — N400 Enlarged prostate without lower urinary tract symptoms: Secondary | ICD-10-CM | POA: Diagnosis not present

## 2017-04-04 DIAGNOSIS — G459 Transient cerebral ischemic attack, unspecified: Secondary | ICD-10-CM | POA: Diagnosis present

## 2017-04-04 DIAGNOSIS — R2990 Unspecified symptoms and signs involving the nervous system: Secondary | ICD-10-CM

## 2017-04-04 DIAGNOSIS — R42 Dizziness and giddiness: Secondary | ICD-10-CM | POA: Diagnosis not present

## 2017-04-04 DIAGNOSIS — I5022 Chronic systolic (congestive) heart failure: Secondary | ICD-10-CM | POA: Diagnosis not present

## 2017-04-04 DIAGNOSIS — Z8673 Personal history of transient ischemic attack (TIA), and cerebral infarction without residual deficits: Secondary | ICD-10-CM | POA: Diagnosis not present

## 2017-04-04 DIAGNOSIS — I2510 Atherosclerotic heart disease of native coronary artery without angina pectoris: Secondary | ICD-10-CM | POA: Diagnosis not present

## 2017-04-04 DIAGNOSIS — I639 Cerebral infarction, unspecified: Secondary | ICD-10-CM

## 2017-04-04 DIAGNOSIS — Z951 Presence of aortocoronary bypass graft: Secondary | ICD-10-CM | POA: Insufficient documentation

## 2017-04-04 DIAGNOSIS — I6782 Cerebral ischemia: Secondary | ICD-10-CM | POA: Diagnosis not present

## 2017-04-04 DIAGNOSIS — R3916 Straining to void: Secondary | ICD-10-CM | POA: Diagnosis not present

## 2017-04-04 HISTORY — DX: Transient cerebral ischemic attack, unspecified: G45.9

## 2017-04-04 HISTORY — DX: Unspecified convulsions: R56.9

## 2017-04-04 LAB — URINALYSIS, MICROSCOPIC (REFLEX)
Squamous Epithelial / LPF: NONE SEEN
WBC, UA: NONE SEEN WBC/hpf (ref 0–5)

## 2017-04-04 LAB — DIFFERENTIAL
BASOS ABS: 0 10*3/uL (ref 0.0–0.1)
BASOS PCT: 0 %
EOS ABS: 0.1 10*3/uL (ref 0.0–0.7)
EOS PCT: 3 %
Lymphocytes Relative: 12 %
Lymphs Abs: 0.6 10*3/uL — ABNORMAL LOW (ref 0.7–4.0)
Monocytes Absolute: 0.6 10*3/uL (ref 0.1–1.0)
Monocytes Relative: 12 %
NEUTROS PCT: 73 %
Neutro Abs: 3.7 10*3/uL (ref 1.7–7.7)

## 2017-04-04 LAB — CBC
HCT: 39.2 % (ref 39.0–52.0)
Hemoglobin: 13.4 g/dL (ref 13.0–17.0)
MCH: 30.9 pg (ref 26.0–34.0)
MCHC: 34.2 g/dL (ref 30.0–36.0)
MCV: 90.3 fL (ref 78.0–100.0)
Platelets: 122 10*3/uL — ABNORMAL LOW (ref 150–400)
RBC: 4.34 MIL/uL (ref 4.22–5.81)
RDW: 14.6 % (ref 11.5–15.5)
WBC: 5.1 10*3/uL (ref 4.0–10.5)

## 2017-04-04 LAB — COMPREHENSIVE METABOLIC PANEL
ALT: 18 U/L (ref 17–63)
ANION GAP: 6 (ref 5–15)
AST: 20 U/L (ref 15–41)
Albumin: 3.6 g/dL (ref 3.5–5.0)
Alkaline Phosphatase: 40 U/L (ref 38–126)
BILIRUBIN TOTAL: 1.2 mg/dL (ref 0.3–1.2)
BUN: 27 mg/dL — AB (ref 6–20)
CHLORIDE: 105 mmol/L (ref 101–111)
CO2: 24 mmol/L (ref 22–32)
Calcium: 9 mg/dL (ref 8.9–10.3)
Creatinine, Ser: 0.88 mg/dL (ref 0.61–1.24)
GFR calc Af Amer: >60 mL/min (ref >60)
GFR calc non Af Amer: >60 mL/min (ref >60)
GLUCOSE: 123 mg/dL — AB (ref 65–99)
POTASSIUM: 3.8 mmol/L (ref 3.5–5.1)
Sodium: 135 mmol/L (ref 135–145)
TOTAL PROTEIN: 6.1 g/dL — AB (ref 6.5–8.1)

## 2017-04-04 LAB — URINALYSIS, ROUTINE W REFLEX MICROSCOPIC
Bilirubin Urine: NEGATIVE
Glucose, UA: NEGATIVE mg/dL
Ketones, ur: NEGATIVE mg/dL
LEUKOCYTES UA: NEGATIVE
Nitrite: NEGATIVE
PROTEIN: NEGATIVE mg/dL
SPECIFIC GRAVITY, URINE: 1.016 (ref 1.005–1.030)
pH: 5.5 (ref 5.0–8.0)

## 2017-04-04 LAB — TROPONIN I: Troponin I: <0.03 ng/mL (ref <0.03)

## 2017-04-04 MED ORDER — ASPIRIN 81 MG PO CHEW
81.0000 mg | CHEWABLE_TABLET | Freq: Every day | ORAL | Status: DC
  Administered 2017-04-04: 81 mg via ORAL
  Filled 2017-04-04: qty 1

## 2017-04-04 MED ORDER — MELATONIN 3 MG PO TBDP
3.0000 mg | ORAL_TABLET | Freq: Every evening | ORAL | Status: DC | PRN
  Filled 2017-04-04: qty 1

## 2017-04-04 MED ORDER — SODIUM CHLORIDE 0.9 % IV BOLUS (SEPSIS)
250.0000 mL | Freq: Once | INTRAVENOUS | Status: AC
  Administered 2017-04-04: 250 mL via INTRAVENOUS

## 2017-04-04 MED ORDER — MELATONIN 3 MG PO TABS
3.0000 mg | ORAL_TABLET | Freq: Every evening | ORAL | Status: DC | PRN
  Administered 2017-04-04: 3 mg via ORAL
  Filled 2017-04-04 (×2): qty 1

## 2017-04-04 MED ORDER — ACETAMINOPHEN 650 MG RE SUPP: 650.0000 mg | Freq: Four times a day (QID) | RECTAL | Status: DC | PRN

## 2017-04-04 MED ORDER — HYDRALAZINE HCL 20 MG/ML IJ SOLN: 5.0000 mg | INTRAMUSCULAR | Status: DC | PRN

## 2017-04-04 MED ORDER — TAMSULOSIN HCL 0.4 MG PO CAPS
0.4000 mg | ORAL_CAPSULE | Freq: Every day | ORAL | Status: DC
  Administered 2017-04-04 – 2017-04-05 (×2): 0.4 mg via ORAL
  Filled 2017-04-04 (×2): qty 1

## 2017-04-04 MED ORDER — SODIUM CHLORIDE 0.9% FLUSH
3.0000 mL | Freq: Two times a day (BID) | INTRAVENOUS | Status: DC
Start: 2017-04-04 — End: 2017-04-05
  Administered 2017-04-04 – 2017-04-05 (×3): 3 mL via INTRAVENOUS

## 2017-04-04 MED ORDER — ACETAMINOPHEN 325 MG PO TABS
650.0000 mg | ORAL_TABLET | Freq: Four times a day (QID) | ORAL | Status: DC | PRN
  Administered 2017-04-04: 650 mg via ORAL
  Filled 2017-04-04: qty 2

## 2017-04-04 MED ORDER — TAMSULOSIN HCL 0.4 MG PO CAPS: 0.4000 mg | ORAL_CAPSULE | Freq: Every day | ORAL | 3 refills | Status: DC

## 2017-04-04 MED ORDER — ONDANSETRON 4 MG PO TBDP: 4.0000 mg | ORAL_TABLET | Freq: Three times a day (TID) | ORAL | 0 refills | Status: DC | PRN

## 2017-04-04 MED ORDER — ONDANSETRON HCL 4 MG/2ML IJ SOLN: 4.0000 mg | Freq: Four times a day (QID) | INTRAMUSCULAR | Status: DC | PRN

## 2017-04-04 MED ORDER — ONDANSETRON HCL 4 MG PO TABS: 4.0000 mg | ORAL_TABLET | Freq: Four times a day (QID) | ORAL | Status: DC | PRN

## 2017-04-04 MED ORDER — SODIUM CHLORIDE 0.9% FLUSH: 3.0000 mL | INTRAVENOUS | Status: DC | PRN

## 2017-04-04 MED ORDER — ENOXAPARIN SODIUM 40 MG/0.4ML ~~LOC~~ SOLN
40.0000 mg | SUBCUTANEOUS | Status: DC
  Administered 2017-04-04: 40 mg via SUBCUTANEOUS
  Filled 2017-04-04: qty 0.4

## 2017-04-04 MED ORDER — STROKE: EARLY STAGES OF RECOVERY BOOK
Freq: Once | Status: AC
  Administered 2017-04-04: 17:00:00

## 2017-04-04 MED ORDER — SODIUM CHLORIDE 0.9 % IV SOLN: 250.0000 mL | INTRAVENOUS | Status: DC | PRN

## 2017-04-04 NOTE — Progress Notes (Signed)
Pt came down for MRI and at first pt seemed fine, conversing about different subjects, put pt in the scanner and after about 8 minutes pt started yelling that he wanted to get out, that no MRI should take this long and we had enough pictures.  Pt refused to go any further with exam.  Diffusion images and the MRA were aquired however and these films will be turned in for evaluation.

## 2017-04-04 NOTE — Patient Instructions (Signed)
It was nice to see you today! Your recent urine culture did NOT show an infection. I suspect your urinary symptoms may be due to running out of your flomax- this medication makes it easier for you to pass your urine We will restart this for you, please let me know if not helpful in the next week or so I am also going to arrange for you to see neurology again to discuss your memory.  Take care!

## 2017-04-04 NOTE — Evaluation (Signed)
Occupational Therapy Evaluation Patient Details Name: Victor Waters MRN: [id number] DOB: 1926/11/23 Today's Date: 04/04/2017    History of Present Illness This 83 y.o. male admitted with intermittent confusion, dizziness, difficulty speaking and Lt facial droop.  MRI pending.   PMH includes:  essential HTN, h/o TIA, seizures, CAD    Clinical Impression   Pt admitted with above. He demonstrates the below listed deficits and will benefit from continued OT to maximize safety and independence with BADLs.  Pt presents to OT with generalized weakness, mild pain.  He requires supervision for ADLs, but reports he feels "weak" compared to his normal (PTA, pt was very active and independent).    BP Supine:  171/75 Seated: 159/87 Standing:  147/82 Standing after 3 mins 147/89     Follow Up Recommendations  No OT follow up;Supervision - Intermittent    Equipment Recommendations  None recommended by OT    Recommendations for Other Services       Precautions / Restrictions Restrictions Weight Bearing Restrictions: No      Mobility Bed Mobility Overal bed mobility: Independent                Transfers Overall transfer level: Needs assistance   Transfers: Sit to/from Stand;Stand Pivot Transfers Sit to Stand: Supervision Stand pivot transfers: Supervision            Balance                                           ADL either performed or assessed with clinical judgement   ADL Overall ADL's : Needs assistance/impaired Eating/Feeding: Independent   Grooming: Wash/dry hands;Wash/dry face;Oral care;Brushing hair;Supervision/safety;Standing   Upper Body Bathing: Set up;Sitting   Lower Body Bathing: Supervison/ safety;Sit to/from stand   Upper Body Dressing : Set up;Sitting   Lower Body Dressing: Supervision/safety;Sit to/from stand Lower Body Dressing Details (indicate cue type and reason): Pt with c/o frontal headache when leaning forward to  don socks  Toilet Transfer: Supervision/safety;Ambulation;Comfort height toilet;Grab bars   Toileting- Clothing Manipulation and Hygiene: Supervision/safety;Sit to/from stand       Functional mobility during ADLs: Supervision/safety       Vision Baseline Vision/History: Wears glasses Wears Glasses: At all times Patient Visual Report: No change from baseline Vision Assessment?: Yes Eye Alignment: Within Functional Limits Ocular Range of Motion: Within Functional Limits Alignment/Gaze Preference: Within Defined Limits Tracking/Visual Pursuits: Able to track stimulus in all quads without difficulty Convergence: Within functional limits Visual Fields: No apparent deficits     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   Praxis Praxis Praxis tested?: Within functional limits    Pertinent Vitals/Pain Pain Assessment: Faces Faces Pain Scale: Hurts little more Pain Location: headache  Pain Descriptors / Indicators: Headache Pain Intervention(s): Monitored during session (RN notified )     Hand Dominance Right   Extremity/Trunk Assessment Upper Extremity Assessment Upper Extremity Assessment: Overall WFL for tasks assessed   Lower Extremity Assessment Lower Extremity Assessment: Defer to PT evaluation   Cervical / Trunk Assessment Cervical / Trunk Assessment: Kyphotic   Communication Communication Communication: No difficulties   Cognition Arousal/Alertness: Awake/alert Behavior During Therapy: WFL for tasks assessed/performed Overall Cognitive Status: Within Functional Limits for tasks assessed  General Comments  Pt denies dizziness, but reports he feels "weak"     Exercises     Shoulder Instructions      Home Living Family/patient expects to be discharged to:: Private residence Living Arrangements: Spouse/significant other Available Help at Discharge: Family;Available 24 hours/day Type of Home:  House Home Access: Stairs to enter [employer] of Steps: 5 Entrance Stairs-Rails: Right;Left Home Layout: One level     Bathroom Shower/Tub: Teacher, early years/pre: Handicapped height     Home Equipment: Cane - single point;Grab bars - tub/shower;Shower seat          Prior Functioning/Environment Level of Independence: Independent        Comments: Pt full independent except driving, which wife does, due to pt with h/o seizures.          OT Problem List: Decreased strength;Decreased activity tolerance;Impaired balance (sitting and/or standing);Pain      OT Treatment/Interventions: Self-care/ADL training;DME and/or AE instruction;Therapeutic activities;Patient/family education;Balance training    OT Goals(Current goals can be found in the care plan section) Acute Rehab OT Goals Patient Stated Goal: to get back to normal  OT Goal Formulation: With patient Potential to Achieve Goals: Good ADL Goals Pt Will Perform Grooming: Independently;standing Pt Will Perform Lower Body Bathing: Independently;sit to/from stand Pt Will Perform Lower Body Dressing: Independently;sit to/from stand Pt Will Transfer to Toilet: Independently;ambulating;regular height toilet Pt Will Perform Toileting - Clothing Manipulation and hygiene: Independently;sit to/from stand Pt Will Perform Tub/Shower Transfer: Tub transfer;with modified independence;ambulating;shower seat  OT Frequency: Min 2X/week   Barriers to D/C:            Co-evaluation              End of Session Nurse Communication: Mobility status  Activity Tolerance: Patient tolerated treatment well Patient left: in bed;with call bell/phone within reach;with bed alarm set  OT Visit Diagnosis: Unsteadiness on feet (R26.81)                Time: 9024-0973 OT Time Calculation (min): 25 min Charges:  OT General Charges $OT Visit: 1 Procedure OT Evaluation $OT Eval Low Complexity: 1 Procedure OT  Treatments $Therapeutic Activity: 8-22 mins G-Codes: OT G-codes **NOT FOR INPATIENT CLASS** Functional Assessment Tool Used: AM-PAC 6 Clicks Daily Activity Functional Limitation: Self care Self Care Current Status (Z3299): At least 40 percent but less than 60 percent impaired, limited or restricted Self Care Goal Status (M4268): At least 1 percent but less than 20 percent impaired, limited or restricted   [facility], OTR/L [phone]   Lucille Passy M 04/04/2017, 5:06 PM

## 2017-04-04 NOTE — ED Notes (Signed)
ED Provider at bedside. 

## 2017-04-04 NOTE — ED Notes (Signed)
Pt reports dizziness has been intermittent since this past Friday.

## 2017-04-04 NOTE — ED Notes (Signed)
Patient transported to CT 

## 2017-04-04 NOTE — ED Provider Notes (Signed)
[facility] DEPT [facility] Provider Note   CSN: [id number] Arrival date & time: 04/04/17  0934     History   Chief Complaint Chief Complaint  Patient presents with  . Dizziness    HPI Victor Waters is a 83 y.o. male.  The history is provided by the patient, a relative and medical records (PCP). No language interpreter was used.  Dizziness   Victor Waters is a 83 y.o. male who presents to the Emergency Department complaining of dizziness.  He is referred by his PCP for evaluation of dizziness. Over the last 3 days he has experienced intermittent dizziness and confusion. His granddaughter states that he had transient speech difficulties 2 days ago. He endorses waxing and waning associated headache. No fevers, chest pain, shortness of breath. When he is up at his PCPs office earlier today he became lightheaded and felt like he might pass out. He denies any numbness or weakness. His granddaughter states he had similar symptoms back in September of last year was diagnosed with a seizure. He does not take any medications for seizure. Past Medical History:  Diagnosis Date  . Anxiety   . Coronary artery disease    a. s/p CABG;  b. LHC (2/11):  LM 30-40, pLAD 95-99 then 80 and 90, mCFX 90, pRCA occluded, S-dRCA ok, S-D1 ok, S-OM patent with 50, L-LAD ok.  Med Rx recommended.  c.  Myoview (5/13):  Low risk with small fixed inf defect c/w scar, no ischemia, EF 48%.  d.  Echo (6/13):  EF 55-60%, inf HK, Gr 1 DD, MAC;  e. Lexiscan Myoview (10/14):  Low risk, EF 38%, inferobasal infarct, no ischemia.  . Fatigue   . Hx of echocardiogram    Echo (10/14):  EF 55-60%, Gr 1DD, MAC, mild MR, mild LAE.  Marland Kitchen Hyperlipidemia   . Hypertension   . Hyponatremia 09/2012   Associated with postural hypotension and confusion  . Insomnia   . Myocardial infarction ([place]) 2003   hx of   . Renal artery stenosis ([facility])    bilateral;  s/p R RA stent 2011  . Syncope and collapse 08/31/2016  . Systolic heart failure  ([place])    CHRONIC    Patient Active Problem List   Diagnosis Date Noted  . Syncope and collapse 08/31/2016  . Head contusion   . Tongue injury   . Chronic systolic heart failure ([place])   . Cerebral infarction, remote, resolved   . Mild dementia 08/10/2016  . Viral URI with cough 01/31/2016  . Sleep disturbance 01/06/2016  . Loose stools 01/06/2016  . Ventricular ectopy 12/31/2014  . Acute confusional state 12/31/2014  . Weakness 12/23/2014  . Palpitations   . Nocturia 09/20/2013  . Hyponatremia 09/29/2012  . Fatigue 05/09/2012  . PERSONAL HISTORY OTHER DISORDER URINARY SYSTEM 12/22/2010  . Thrombocytopenia ([place]) 08/04/2010  . LEUKOCYTOPENIA UNSPECIFIED 08/04/2010  . TINNITUS, CHRONIC 07/06/2010  . RENAL ARTERY STENOSIS 11/25/2009  . RENAL INSUFFICIENCY 11/25/2009  . HYPOTENSION 11/01/2009  . DYSPNEA 09/24/2009  . BRADYCARDIA 09/04/2009  . HYPOTENSION-ORTHOSTATIC 09/04/2009  . DIZZINESS 09/04/2009  . HYPERLIPIDEMIA 01/31/2008  . DEPRESSIVE DISORDER 01/23/2008  . Essential hypertension 01/23/2008  . ANXIETY 06/02/2007  . Coronary atherosclerosis 06/02/2007  . INSOMNIA 06/02/2007    Past Surgical History:  Procedure Laterality Date  . CARDIAC CATHETERIZATION  2003   revdeling severe three-vessel disease  . CORONARY ARTERY BYPASS GRAFT     LIMA to LAD, SVG to diagonal, SVG to RCA, and SVG to  circumflex  . POLYPECTOMY     colon  . RENAL ARTERY STENT  01-28-10       Home Medications    Prior to Admission medications   Medication Sig Start Date End Date Taking? Authorizing Provider  aspirin 81 MG tablet Take 81 mg by mouth at bedtime.    Yes Historical Provider, MD  CALCIUM-MAGNESIUM-ZINC PO Take 1 tablet by mouth daily.   Yes Historical Provider, MD  carvedilol (COREG) 3.125 MG tablet Take 1 tablet (3.125 mg total) by mouth 2 (two) times daily with a meal. 11/25/16  Yes Gay Filler Copland, MD  cholecalciferol (VITAMIN D) 1000 UNITS tablet Take 2,000 Units by mouth  daily.   Yes Historical Provider, MD  ezetimibe (ZETIA) 10 MG tablet Take 0.5 tablets (5 mg total) by mouth daily. 11/25/16  Yes Gay Filler Copland, MD  Melatonin 3 MG TBDP Take 1 tablet by mouth at bedtime as needed (sleep). Reported on 04/29/2016   Yes Historical Provider, MD  nitroGLYCERIN (NITROSTAT) 0.4 MG SL tablet DISSOLVE 1 TAB UNDER TONGUE AS NEEDED FOR CHEST PAIN, MAY REPEAT IN 5 MINUTES AS DIRECTED. ER IF NO BETTER. 10/30/15  Yes Hendricks Limes, MD  sacubitril-valsartan (ENTRESTO) 49-51 MG Take 1 tablet by mouth 2 (two) times daily. 03/26/16  Yes Minus Breeding, MD  tamsulosin (FLOMAX) 0.4 MG CAPS capsule Take 1 capsule (0.4 mg total) by mouth daily. 04/04/17  Yes Darreld Mclean, MD    Family History Family History  Problem Relation Age of Onset  . Diabetes Mother   . Stroke Mother   . Heart attack Father 1    died  . Colon cancer Other     paternal grandfather    Social History Social History  Substance Use Topics  . Smoking status: Never Smoker  . Smokeless tobacco: Never Used  . Alcohol use Not on file     Allergies   Lisinopril; Zithromax [azithromycin]; and Niacin   Review of Systems Review of Systems  Neurological: Positive for dizziness.  All other systems reviewed and are negative.    Physical Exam Updated Vital Signs BP (!) 149/76 (BP Location: Right Arm)   Pulse 85   Temp 97.8 F (36.6 C) (Oral)   Resp 14   Ht 5' 3"  (1.6 m)   Wt 142 lb (64.4 kg)   SpO2 99%   BMI 25.15 kg/m   Physical Exam  Constitutional: He is oriented to person, place, and time. He appears well-developed and well-nourished.  HENT:  Head: Normocephalic and atraumatic.  Cardiovascular: Normal rate and regular rhythm.   No murmur heard. Pulmonary/Chest: Effort normal and breath sounds normal. No respiratory distress.  Abdominal: Soft. There is no tenderness. There is no rebound and no guarding.  Musculoskeletal: He exhibits no edema or tenderness.  Neurological: He is  alert and oriented to person, place, and time.  5 out of 5 strength in all 4 extremities. Sensation to light touch intact in all 4 extremities. No pronator drift. No ataxia on finger to nose bilaterally. Pupils equal round and reactive. EOMI. There is mild left lower facial droop at rest but on strength testing facial muscles are symmetric.  Skin: Skin is warm and dry.  Psychiatric: He has a normal mood and affect. His behavior is normal.  Nursing note and vitals reviewed.    ED Treatments / Results  Labs (all labs ordered are listed, but only abnormal results are displayed) Labs Reviewed  CBC  DIFFERENTIAL  COMPREHENSIVE METABOLIC PANEL  URINALYSIS, ROUTINE W REFLEX MICROSCOPIC  TROPONIN I    EKG  EKG Interpretation None       Radiology No results found.  Procedures Procedures (including critical care time)  Medications Ordered in ED Medications - No data to display   Initial Impression / Assessment and Plan / ED Course  I have reviewed the triage vital signs and the nursing notes.  Pertinent labs & imaging results that were available during my care of the patient were reviewed by me and considered in my medical decision making (see chart for details).     Patient here for evaluation of episodic confusion, dizziness over the last few days, episode of speech changes yesterday. He is mildly dizzy with mild left lower facial droop, family states the facial droop is new but on known how long this has been present (days vs hours). Labs are at his baseline. Plan to admit for observation and further workup with possible MRI given his multiple neurologic symptoms.  Final Clinical Impressions(s) / ED Diagnoses   Final diagnoses:  None    New Prescriptions New Prescriptions   No medications on file     Quintella Reichert, MD 04/05/17 2254

## 2017-04-04 NOTE — ED Notes (Signed)
Assumed care of patient from [place], [place]. PT resting quietly, awaiting admission process.

## 2017-04-04 NOTE — Progress Notes (Signed)
   Patient coming from [facility] for stroke workup. 3 day history of intermittent confusion, dizziness, difficulty speaking and left facial droop. Patient presented to the ED from PCPs office when he was noted to be dizzy and near syncopal. Patient is afebrile and vital signs are stable. CT head unremarkable. No other identifiable etiology such as infection, seizure, metabolic derangement. Patient except to telemetry bed under observation status.   Linna Darner, MD [facility] 04/04/2017, 11:58 AM

## 2017-04-04 NOTE — ED Triage Notes (Signed)
Pt reports seeing PCP today d/t difficulty urinating and developed dizziness around 0925 while in the office. Denies LOC, fall. Denies numbness/tingling. Reports visual distortions in L eye. Reports dizziness has improved but is still present. Denies n/v.

## 2017-04-04 NOTE — Progress Notes (Signed)
[facility] at [facility] [address], [place], [place] [address] [phone]  Date:  04/04/2017   Name:  Victor Waters   DOB:  June 06, 1926   MRN:  [id number]  PCP:  Lamar Blinks, MD    Chief Complaint: Urinary Tract Infection (c/o burning during urination and taking longer to empty bladder. Would like refill on Tamsulosin)   History of Present Illness:  Victor Waters is a 83 y.o. very pleasant male patient who presents with the following:  Complex medical history as below including CAD/CABG,cardiomyopathy, renal artery stenosis and mild dementia.  He was in the hospital overnight back in September for an episode of syncope thought due to orthostatic hypotension.  I last saw him after his hospital stay for follow-up.  At that time he was doing well  Here today with concern of possible UTI- pt had called last week for an appt, but was not able to come in until today due to a camping trip.  He spent the night in a tent this past Friday night.  However he had dropped off a urine sample last week that did NOT show any infection.    He is here today as he has noted a slow urine stream and upper abd pain.   abd pain is now resolved.  He notes that it takes him a long time to urinate.  He also has to urinate frequently and sometimes will burn when he urinates   He states that he has been taking his flomax but he has run out- he needs more of this.  Per discharge summary from 9/17 his flomax was stopped (thinking that it may have contributed to syncope), but pt reports he has still been taking it until he ran out about a month aog He also did a urine last week which was negative for infection.    No fever or vomiting  He lives with his grand-daughter Jonelle Sidle who is here with him today.  She related that over the weekend "Pop" seemed to be confused and had a hard time speaking for an undetermined period of time.  They attributed this to  the heat He denies any CP or difficulty breathing.  States that he is eating ok Patient Active Problem List   Diagnosis Date Noted  . Syncope and collapse 08/31/2016  . Head contusion   . Tongue injury   . Chronic systolic heart failure ([place])   . Cerebral infarction, remote, resolved   . Mild dementia 08/10/2016  . Viral URI with cough 01/31/2016  . Sleep disturbance 01/06/2016  . Loose stools 01/06/2016  . Ventricular ectopy 12/31/2014  . Acute confusional state 12/31/2014  . Weakness 12/23/2014  . Palpitations   . Nocturia 09/20/2013  . Hyponatremia 09/29/2012  . Fatigue 05/09/2012  . PERSONAL HISTORY OTHER DISORDER URINARY SYSTEM 12/22/2010  . Thrombocytopenia ([place]) 08/04/2010  . LEUKOCYTOPENIA UNSPECIFIED 08/04/2010  . TINNITUS, CHRONIC 07/06/2010  . RENAL ARTERY STENOSIS 11/25/2009  . RENAL INSUFFICIENCY 11/25/2009  . HYPOTENSION 11/01/2009  . DYSPNEA 09/24/2009  . BRADYCARDIA 09/04/2009  . HYPOTENSION-ORTHOSTATIC 09/04/2009  . DIZZINESS 09/04/2009  . HYPERLIPIDEMIA 01/31/2008  . DEPRESSIVE DISORDER 01/23/2008  . Essential hypertension 01/23/2008  . ANXIETY 06/02/2007  . Coronary atherosclerosis 06/02/2007  . INSOMNIA 06/02/2007    Past Medical History:  Diagnosis Date  . Anxiety   . Coronary artery disease    a. s/p CABG;  b. LHC (2/11):  LM 30-40, pLAD 95-99  then 80 and 90, mCFX 90, pRCA occluded, S-dRCA ok, S-D1 ok, S-OM patent with 50, L-LAD ok.  Med Rx recommended.  c.  Myoview (5/13):  Low risk with small fixed inf defect c/w scar, no ischemia, EF 48%.  d.  Echo (6/13):  EF 55-60%, inf HK, Gr 1 DD, MAC;  e. Lexiscan Myoview (10/14):  Low risk, EF 38%, inferobasal infarct, no ischemia.  . Fatigue   . Hx of echocardiogram    Echo (10/14):  EF 55-60%, Gr 1DD, MAC, mild MR, mild LAE.  Marland Kitchen Hyperlipidemia   . Hypertension   . Hyponatremia 09/2012   Associated with postural hypotension and confusion  . Insomnia   . Myocardial infarction 2003   hx of   . Renal  artery stenosis ([facility])    bilateral;  s/p R RA stent 2011  . Syncope and collapse 08/31/2016  . Systolic heart failure ([place])    CHRONIC    Past Surgical History:  Procedure Laterality Date  . CARDIAC CATHETERIZATION  2003   revdeling severe three-vessel disease  . CORONARY ARTERY BYPASS GRAFT     LIMA to LAD, SVG to diagonal, SVG to RCA, and SVG to circumflex  . POLYPECTOMY     colon  . RENAL ARTERY STENT  01-28-10    Social History  Substance Use Topics  . Smoking status: Never Smoker  . Smokeless tobacco: Never Used  . Alcohol use No    Family History  Problem Relation Age of Onset  . Diabetes Mother   . Stroke Mother   . Heart attack Father 91    died  . Colon cancer Other     paternal grandfather    Allergies  Allergen Reactions  . Lisinopril Cough    Renal artery stenosis by history; ACE inhibitor  would be relatively contraindicated  . Zithromax [Azithromycin]     02/15/14 N&V  . Niacin Other (See Comments)    Burning sensations in head    Medication list has been reviewed and updated.  Current Outpatient Prescriptions on File Prior to Visit  Medication Sig Dispense Refill  . aspirin 81 MG tablet Take 81 mg by mouth at bedtime.     Marland Kitchen CALCIUM-MAGNESIUM-ZINC PO Take 1 tablet by mouth daily.    . carvedilol (COREG) 3.125 MG tablet Take 1 tablet (3.125 mg total) by mouth 2 (two) times daily with a meal. 180 tablet 3  . cholecalciferol (VITAMIN D) 1000 UNITS tablet Take 2,000 Units by mouth daily.    Marland Kitchen ezetimibe (ZETIA) 10 MG tablet Take 0.5 tablets (5 mg total) by mouth daily. 45 tablet 3  . Melatonin 3 MG TBDP Take 1 tablet by mouth at bedtime as needed (sleep). Reported on 04/29/2016    . nitroGLYCERIN (NITROSTAT) 0.4 MG SL tablet DISSOLVE 1 TAB UNDER TONGUE AS NEEDED FOR CHEST PAIN, MAY REPEAT IN 5 MINUTES AS DIRECTED. ER IF NO BETTER. 30 tablet 0  . sacubitril-valsartan (ENTRESTO) 49-51 MG Take 1 tablet by mouth 2 (two) times daily. 60 tablet 9   No current  facility-administered medications on file prior to visit.     Review of Systems:  As per HPI- otherwise negative.  Physical Examination: Vitals:   04/04/17 0900  BP: 124/82  Pulse: 88  Temp: 97.5 F (36.4 C)   Vitals:   04/04/17 0900  Weight: 142 lb 3.2 oz (64.5 kg)  Height: _0  (1.6 m)   Body mass index is 25.19 kg/m. Ideal Body Weight: Weight in (lb)  to have BMI = 25: 140.8  GEN: WDWN, NAD, Non-toxic, A & O x 3, well appearing elderly man HEENT: Atraumatic, Normocephalic. Neck supple. No masses, No LAD.  Bilateral TM wnl, oropharynx normal.  PEERL,EOMI.   Ears and Nose: No external deformity. CV: RRR, No M/G/R. No JVD. No thrill. No extra heart sounds. PULM: CTA B, no wheezes, crackles, rhonchi. No retractions. No resp. distress. No accessory muscle use. ABD: S, NT, ND, +BS. No rebound. No HSM.  Belly is benign EXTR: No c/c/e NEURO Normal gait.  PSYCH: Normally interactive. Conversant but sometimes slow to answer questions. Not depressed or anxious appearing.  Calm demeanor.  GU- normal genitals.  Prostate is large but non- tender.  Non boggy   Assessment and Plan: Weak urine stream - Plan: tamsulosin (FLOMAX) 0.4 MG CAPS capsule  Prostate enlargement - Plan: tamsulosin (FLOMAX) 0.4 MG CAPS capsule  Memory loss - Plan: Ambulatory referral to Neurology  Medication monitoring encounter - Plan: CBC, Comprehensive metabolic panel  Here today to evaluate difficulty with urine and memory.  He ran out of flomax about one month ago- will restart this with the understanding that if it increased dizziness will need to stop using this.  He did see neurology last year but did not follow-up , did not start any medication for dementia at that time.  He would like to see them again- will arrange for him.    We were about to release pt from clinic when he complained that he did not feel he could stand up, he felt very dizzy and his head was hurting a lot. Will have pt seen at ER  now Canceled BW in clinic today [place], MD

## 2017-04-04 NOTE — Progress Notes (Signed)
History and Physical    Victor Waters DCV:[id number] DOB: 21-Apr-1926 DOA: 04/04/2017  PCP: Lamar Blinks, MD Patient coming from: [facility]  Chief Complaint: intermittent confusion, dizziness, difficulty speaking and L facial droop  HPI: Victor Waters is a 83 y.o. male with medical history significant of    3 day history of intermittent confusion, dizziness, difficulty speaking and left facial droop. Patient presented to the ED from PCPs office when he was noted to be dizzy and near syncopal. Dizziness is described as lightheadedness. Patient states he's currently at baseline. States she's had a history of TIAs in the past. Denies any recent chest pain, palpitations, nausea, vomiting, dysuria, frequency, flank pain, neck stiffness, LOC, fall. Patient reports first episode of symptoms came on while camping overnight on 04/01/2017. Difficulty with ambulating in the middle the night to use the bathroom and feeling mildly confused. No further prescriptions available for comparison at this time.  ED Course: Objective findings outlined below.  Review of Systems: As per HPI otherwise 10 point review of systems negative.   Ambulatory Status: No restrictions at baseline.  Past Medical History:  Diagnosis Date  . Anxiety   . Coronary artery disease    a. s/p CABG;  b. LHC (2/11):  LM 30-40, pLAD 95-99 then 80 and 90, mCFX 90, pRCA occluded, S-dRCA ok, S-D1 ok, S-OM patent with 50, L-LAD ok.  Med Rx recommended.  c.  Myoview (5/13):  Low risk with small fixed inf defect c/w scar, no ischemia, EF 48%.  d.  Echo (6/13):  EF 55-60%, inf HK, Gr 1 DD, MAC;  e. Lexiscan Myoview (10/14):  Low risk, EF 38%, inferobasal infarct, no ischemia.  . Fatigue   . Hx of echocardiogram    Echo (10/14):  EF 55-60%, Gr 1DD, MAC, mild MR, mild LAE.  Marland Kitchen Hyperlipidemia   . Hypertension   . Hyponatremia 09/2012   Associated with postural hypotension and confusion  . Insomnia   . Myocardial infarction ([place]) 2003   hx of   . Renal artery stenosis ([facility])    bilateral;  s/p R RA stent 2011  . Seizure ([place]) 2015   unknow etiology and year of seizure???  . Syncope and collapse 08/31/2016  . Systolic heart failure (H[facility])    CHRONIC  . TIA (transient ischemic attack)     Past Surgical History:  Procedure Laterality Date  . CARDIAC CATHETERIZATION  2003   revdeling severe three-vessel disease  . CORONARY ARTERY BYPASS GRAFT     LIMA to LAD, SVG to diagonal, SVG to RCA, and SVG to circumflex  . POLYPECTOMY     colon  . RENAL ARTERY STENT  01-28-10    Social History   Social History  . Marital status: Married    Spouse name: N/A  . Number of children: N/A  . Years of education: N/A   Occupational History  . Not on file.   Social History Main Topics  . Smoking status: Never Smoker  . Smokeless tobacco: Never Used  . Alcohol use Not on file  . Drug use: No  . Sexual activity: Not on file   Other Topics Concern  . Not on file   Social History Narrative   Married   Lives with wife and 51year old ggd    Allergies  Allergen Reactions  . Lisinopril Cough    Renal artery stenosis by history; ACE inhibitor  would be relatively contraindicated  . Zithromax [Azithromycin]     02/15/14 N&V  .  Niacin Other (See Comments)    Burning sensations in head    Family History  Problem Relation Age of Onset  . Diabetes Mother   . Stroke Mother   . Heart attack Father 36    died  . Colon cancer Other     paternal grandfather    Prior to Admission medications   Medication Sig Start Date End Date Taking? Authorizing Provider  aspirin 81 MG tablet Take 81 mg by mouth at bedtime.    Yes Historical Provider, MD  CALCIUM-MAGNESIUM-ZINC PO Take 1 tablet by mouth daily.   Yes Historical Provider, MD  carvedilol (COREG) 3.125 MG tablet Take 1 tablet (3.125 mg total) by mouth 2 (two) times daily with a meal. 11/25/16  Yes Gay Filler Copland, MD  cholecalciferol (VITAMIN D) 1000 UNITS tablet Take 2,000  Units by mouth daily.   Yes Historical Provider, MD  ezetimibe (ZETIA) 10 MG tablet Take 0.5 tablets (5 mg total) by mouth daily. 11/25/16  Yes Gay Filler Copland, MD  Melatonin 3 MG TBDP Take 1 tablet by mouth at bedtime as needed (sleep). Reported on 04/29/2016   Yes Historical Provider, MD  nitroGLYCERIN (NITROSTAT) 0.4 MG SL tablet DISSOLVE 1 TAB UNDER TONGUE AS NEEDED FOR CHEST PAIN, MAY REPEAT IN 5 MINUTES AS DIRECTED. ER IF NO BETTER. 10/30/15  Yes Hendricks Limes, MD  sacubitril-valsartan (ENTRESTO) 49-51 MG Take 1 tablet by mouth 2 (two) times daily. 03/26/16  Yes Minus Breeding, MD  tamsulosin (FLOMAX) 0.4 MG CAPS capsule Take 1 capsule (0.4 mg total) by mouth daily. 04/04/17  Yes Darreld Mclean, MD    Physical Exam: Vitals:   04/04/17 1130 04/04/17 1200 04/04/17 1230 04/04/17 1400  BP: (!) 154/77 (!) 156/78 (!) 161/90 (!) 155/93  Pulse: 78 81 77 85  Resp: _0 Temp:    98.4 F (36.9 C)  TempSrc:    Oral  SpO2: 98% 97% 99% 100%  Weight:      Height:         General:  Appears calm and comfortable Eyes:  PERRL, EOMI, normal lids, iris ENT:  grossly normal hearing, lips & tongue, mmm Neck:  no LAD, masses or thyromegaly Cardiovascular:  RRR, III/VI systolic murmur. 1+ LLE edema.  Respiratory:  CTA bilaterally, no w/r/r. Normal respiratory effort. Abdomen:  soft, ntnd, NABS Skin:  no rash or induration seen on limited exam Musculoskeletal:  grossly normal tone BUE/BLE, good ROM, no bony abnormality Psychiatric:  grossly normal mood and affect, speech fluent and appropriate, AOx3 Neurologic:  CN 2-12 grossly intact, moves all extremities in coordinated fashion, sensation intact, grip strength 5/5 bilat. No dysmetria.   Labs on Admission: I have personally reviewed following labs and imaging studies  CBC:  Recent Labs Lab 04/04/17 0959  WBC 5.1  NEUTROABS 3.7  HGB 13.4  HCT 39.2  MCV 90.3  PLT 782*   Basic Metabolic Panel:  Recent Labs Lab 04/04/17 0959    NA 135  K 3.8  CL 105  CO2 24  GLUCOSE 123*  BUN 27*  CREATININE 0.88  CALCIUM 9.0   GFR: Estimated Creatinine Clearance: 45.8 mL/min (by C-G formula based on SCr of 0.88 mg/dL). Liver Function Tests:  Recent Labs Lab 04/04/17 0959  AST 20  ALT 18  ALKPHOS 40  BILITOT 1.2  PROT 6.1*  ALBUMIN 3.6   No results for input(s): LIPASE, AMYLASE in the last 168 hours. No results for input(s): AMMONIA in the last  168 hours. Coagulation Profile: No results for input(s): INR, PROTIME in the last 168 hours. Cardiac Enzymes:  Recent Labs Lab 04/04/17 0959  TROPONINI <0.03   BNP (last 3 results) No results for input(s): PROBNP in the last 8760 hours. HbA1C: No results for input(s): HGBA1C in the last 72 hours. CBG: No results for input(s): GLUCAP in the last 168 hours. Lipid Profile: No results for input(s): CHOL, HDL, LDLCALC, TRIG, CHOLHDL, LDLDIRECT in the last 72 hours. Thyroid Function Tests: No results for input(s): TSH, T4TOTAL, FREET4, T3FREE, THYROIDAB in the last 72 hours. Anemia Panel: No results for input(s): VITAMINB12, FOLATE, FERRITIN, TIBC, IRON, RETICCTPCT in the last 72 hours. Urine analysis:    Component Value Date/Time   COLORURINE YELLOW 04/04/2017 [place] 04/04/2017 1041   LABSPEC 1.016 04/04/2017 1041   PHURINE 5.5 04/04/2017 1041   GLUCOSEU NEGATIVE 04/04/2017 1041   GLUCOSEU NEGATIVE 03/29/2017 1404   HGBUR MODERATE (A) 04/04/2017 1041   HGBUR negative 07/06/2010 1402   BILIRUBINUR NEGATIVE 04/04/2017 1041   KETONESUR NEGATIVE 04/04/2017 1041   PROTEINUR NEGATIVE 04/04/2017 1041   UROBILINOGEN 0.2 03/29/2017 1404   NITRITE NEGATIVE 04/04/2017 1041   LEUKOCYTESUR NEGATIVE 04/04/2017 1041    Creatinine Clearance: Estimated Creatinine Clearance: 45.8 mL/min (by C-G formula based on SCr of 0.88 mg/dL).  Sepsis Labs: _0 (procalcitonin:4,lacticidven:4) ) Recent Results (from the past 240 hour(s))  Urine Culture      Status: None   Collection Time: 03/29/17  2:04 PM  Result Value Ref Range Status   Organism ID, Bacteria NO GROWTH  Final     Radiological Exams on Admission: Ct Head Wo Contrast  Result Date: 04/04/2017 CLINICAL DATA:  Dizziness since last Friday. EXAM: CT HEAD WITHOUT CONTRAST TECHNIQUE: Contiguous axial images were obtained from the base of the skull through the vertex without intravenous contrast. COMPARISON:  08/31/2016 FINDINGS: Brain: Stable age related cerebral atrophy, ventriculomegaly and periventricular white matter disease. No extra-axial fluid collections are identified. No CT findings for acute hemispheric infarction or intracranial hemorrhage. Remote lacunar type basal ganglia infarcts. No mass lesions. The brainstem and cerebellum are normal. Vascular: No hyperdense vessel or unexpected calcification. Stable vascular calcifications. Skull: Normal. Negative for fracture or focal lesion. Sinuses/Orbits: The paranasal sinuses and mastoid air cells are clear. The globes are intact. Other: No scalp lesions or hematoma. IMPRESSION: Stable age related cerebral atrophy, ventriculomegaly and periventricular white matter disease. No acute intracranial findings or mass lesions. Electronically Signed   By: Marijo Sanes M.D.   On: 04/04/2017 10:17    EKG: Independently reviewed. Sinus, single pvc. No ACS  Assessment/Plan Active Problems:   Essential hypertension   DIZZINESS   Insomnia   TIA (transient ischemic attack)   Facial droop   Localized swelling of lower extremity   BPH (benign prostatic hyperplasia)   Stroke-like symptoms    Confusion/dizziness/facial droop: currently resolved. Intermittent for 3 days. Concerning for intermittent ischemia. Doubt infectious process, metabolic derangement, medication induced, or seizure related. CT head negative. h/o TIA in past - MRI, MRA - Carotid US - PT/OT - Echo - Lipids, A1c - ASA - orthostatic vs  LLE swelling: Mild but pt states  this is a new finding  - LLE duplex  HTN: allow permissive HTN - last episode just piror to arrival - Hold coreg, entresto - Hydralazine PRN  Insomnia: - continue melatonin  BPH: - continue flomax.   DVT prophylaxis: lovenox  Code Status: full  Family Communication: none  Disposition Plan:  pending improvement  Consults called: none  Admission status: observation     Mayerli Kirst J MD [facility]  If 7PM-7AM, please contact night-coverage [email or web address] Password [facility]  04/04/2017, 3:19 PM

## 2017-04-05 ENCOUNTER — Observation Stay (HOSPITAL_BASED_OUTPATIENT_CLINIC_OR_DEPARTMENT_OTHER): Payer: Medicare HMO

## 2017-04-05 DIAGNOSIS — I10 Essential (primary) hypertension: Secondary | ICD-10-CM

## 2017-04-05 DIAGNOSIS — R42 Dizziness and giddiness: Secondary | ICD-10-CM

## 2017-04-05 DIAGNOSIS — G459 Transient cerebral ischemic attack, unspecified: Secondary | ICD-10-CM

## 2017-04-05 DIAGNOSIS — M79609 Pain in unspecified limb: Secondary | ICD-10-CM | POA: Diagnosis not present

## 2017-04-05 DIAGNOSIS — I6350 Cerebral infarction due to unspecified occlusion or stenosis of unspecified cerebral artery: Secondary | ICD-10-CM

## 2017-04-05 LAB — CBC
HEMATOCRIT: 41.1 % (ref 39.0–52.0)
HEMOGLOBIN: 13.6 g/dL (ref 13.0–17.0)
MCH: 29.6 pg (ref 26.0–34.0)
MCHC: 33.1 g/dL (ref 30.0–36.0)
MCV: 89.5 fL (ref 78.0–100.0)
Platelets: 116 10*3/uL — ABNORMAL LOW (ref 150–400)
RBC: 4.59 MIL/uL (ref 4.22–5.81)
RDW: 14.4 % (ref 11.5–15.5)
WBC: 4.3 10*3/uL (ref 4.0–10.5)

## 2017-04-05 LAB — LIPID PANEL
CHOLESTEROL: 224 mg/dL — AB (ref 0–200)
HDL: 38 mg/dL — ABNORMAL LOW (ref >40)
LDL Cholesterol: 168 mg/dL — ABNORMAL HIGH (ref 0–99)
TRIGLYCERIDES: 88 mg/dL (ref <150)
Total CHOL/HDL Ratio: 5.9 RATIO
VLDL: 18 mg/dL (ref 0–40)

## 2017-04-05 LAB — COMPREHENSIVE METABOLIC PANEL
ALK PHOS: 42 U/L (ref 38–126)
ALT: 15 U/L — AB (ref 17–63)
AST: 19 U/L (ref 15–41)
Albumin: 3.5 g/dL (ref 3.5–5.0)
Anion gap: 8 (ref 5–15)
BILIRUBIN TOTAL: 1 mg/dL (ref 0.3–1.2)
BUN: 21 mg/dL — AB (ref 6–20)
CALCIUM: 9 mg/dL (ref 8.9–10.3)
CO2: 22 mmol/L (ref 22–32)
CREATININE: 0.87 mg/dL (ref 0.61–1.24)
Chloride: 107 mmol/L (ref 101–111)
GFR calc Af Amer: >60 mL/min (ref >60)
Glucose, Bld: 104 mg/dL — ABNORMAL HIGH (ref 65–99)
Potassium: 3.9 mmol/L (ref 3.5–5.1)
Sodium: 137 mmol/L (ref 135–145)
Total Protein: 5.7 g/dL — ABNORMAL LOW (ref 6.5–8.1)

## 2017-04-05 LAB — VAS US CAROTID
LCCADDIAS: -19 cm/s
LEFT ECA DIAS: -6 cm/s
LEFT VERTEBRAL DIAS: 13 cm/s
LICADSYS: -69 cm/s
Left CCA dist sys: -76 cm/s
Left CCA prox dias: 11 cm/s
Left CCA prox sys: 85 cm/s
Left ICA dist dias: -28 cm/s
Left ICA prox dias: -12 cm/s
Left ICA prox sys: -51 cm/s
RCCADSYS: -76 cm/s
RCCAPSYS: 65 cm/s
RIGHT ECA DIAS: -15 cm/s
RIGHT VERTEBRAL DIAS: 8 cm/s
Right CCA prox dias: 12 cm/s

## 2017-04-05 LAB — ECHOCARDIOGRAM COMPLETE
Height: 63 in
Weight: 2272 oz

## 2017-04-05 LAB — MAGNESIUM: Magnesium: 2.1 mg/dL (ref 1.7–2.4)

## 2017-04-05 MED ORDER — CARVEDILOL 3.125 MG PO TABS: 3.1250 mg | ORAL_TABLET | Freq: Two times a day (BID) | ORAL | Status: DC

## 2017-04-05 MED ORDER — ATORVASTATIN CALCIUM 40 MG PO TABS: 40.0000 mg | ORAL_TABLET | Freq: Every day | ORAL | Status: DC

## 2017-04-05 MED ORDER — ATORVASTATIN CALCIUM 40 MG PO TABS: 40.0000 mg | ORAL_TABLET | Freq: Every day | ORAL | 0 refills | Status: DC

## 2017-04-05 NOTE — Evaluation (Signed)
Speech Language Pathology Evaluation Patient Details Name: Victor Waters MRN: [id number] DOB: [birth year] Today's Date: 04/05/2017 Time: 4287-6811 SLP Time Calculation (min) (ACUTE ONLY): 23 min  Problem List:  Patient Active Problem List   Diagnosis Date Noted  . TIA (transient ischemic attack) 04/04/2017  . Facial droop 04/04/2017  . Localized swelling of lower extremity 04/04/2017  . BPH (benign prostatic hyperplasia) 04/04/2017  . Stroke-like symptoms 04/04/2017  . Syncope and collapse 08/31/2016  . Head contusion   . Tongue injury   . Chronic systolic heart failure ([place])   . Cerebral infarction, remote, resolved   . Mild dementia 08/10/2016  . Viral URI with cough 01/31/2016  . Sleep disturbance 01/06/2016  . Loose stools 01/06/2016  . Ventricular ectopy 12/31/2014  . Acute confusional state 12/31/2014  . Weakness 12/23/2014  . Palpitations   . Nocturia 09/20/2013  . Hyponatremia 09/29/2012  . Fatigue 05/09/2012  . PERSONAL HISTORY OTHER DISORDER URINARY SYSTEM 12/22/2010  . Thrombocytopenia ([place]) 08/04/2010  . LEUKOCYTOPENIA UNSPECIFIED 08/04/2010  . TINNITUS, CHRONIC 07/06/2010  . RENAL ARTERY STENOSIS 11/25/2009  . RENAL INSUFFICIENCY 11/25/2009  . HYPOTENSION 11/01/2009  . DYSPNEA 09/24/2009  . BRADYCARDIA 09/04/2009  . HYPOTENSION-ORTHOSTATIC 09/04/2009  . DIZZINESS 09/04/2009  . HYPERLIPIDEMIA 01/31/2008  . DEPRESSIVE DISORDER 01/23/2008  . Essential hypertension 01/23/2008  . ANXIETY 06/02/2007  . Coronary atherosclerosis 06/02/2007  . Insomnia 06/02/2007   Past Medical History:  Past Medical History:  Diagnosis Date  . Anxiety   . Coronary artery disease    a. s/p CABG;  b. LHC (2/11):  LM 30-40, pLAD 95-99 then 80 and 90, mCFX 90, pRCA occluded, S-dRCA ok, S-D1 ok, S-OM patent with 50, L-LAD ok.  Med Rx recommended.  c.  Myoview (5/13):  Low risk with small fixed inf defect c/w scar, no ischemia, EF 48%.  d.  Echo (6/13):  EF 55-60%, inf HK, Gr 1  DD, MAC;  e. Lexiscan Myoview (10/14):  Low risk, EF 38%, inferobasal infarct, no ischemia.  . Fatigue   . Hx of echocardiogram    Echo (10/14):  EF 55-60%, Gr 1DD, MAC, mild MR, mild LAE.  Marland Kitchen Hyperlipidemia   . Hypertension   . Hyponatremia 09/2012   Associated with postural hypotension and confusion  . Insomnia   . Myocardial infarction ([place]) 2003   hx of   . Renal artery stenosis (H[facility])    bilateral;  s/p R RA stent 2011  . Seizure ([place]) 2015   unknow etiology and year of seizure???  . Syncope and collapse 08/31/2016  . Systolic heart failure (HCC)    CHRONIC  . TIA (transient ischemic attack)    Past Surgical History:  Past Surgical History:  Procedure Laterality Date  . CARDIAC CATHETERIZATION  2003   revdeling severe three-vessel disease  . CORONARY ARTERY BYPASS GRAFT     LIMA to LAD, SVG to diagonal, SVG to RCA, and SVG to circumflex  . POLYPECTOMY     colon  . RENAL ARTERY STENT  01-28-10   HPI:  83 y.o. male admitted with intermittent confusion, dizziness, difficulty speaking and Lt facial droop.  MRI negative for acute and early subacute infarct.   PMH includes:  essential HTN, h/o TIA, seizures, CAD    Assessment / Plan / Recommendation Clinical Impression  Pt ppears to be at his cognitive-linguistic baseline. He does    SLP Assessment  SLP Recommendation/Assessment: Patient does not need any further Speech Lanaguage Pathology Services SLP Visit Diagnosis: Cognitive communication  deficit (R41.841)    Follow Up Recommendations  None;Other (comment) (intermittent supervision)    Frequency and Duration           SLP Evaluation Cognition  Overall Cognitive Status: Within Functional Limits for tasks assessed Orientation Level: Oriented X4       Comprehension  Auditory Comprehension Overall Auditory Comprehension: Appears within functional limits for tasks assessed    Expression Expression Primary Mode of Expression: Verbal Verbal Expression Overall  Verbal Expression: Appears within functional limits for tasks assessed Written Expression Dominant Hand: Right   Oral / Motor  Oral Motor/Sensory Function Overall Oral Motor/Sensory Function:  (no obvious focal deficit) Motor Speech Overall Motor Speech: Appears within functional limits for tasks assessed   GO          Functional Assessment Tool Used: skilled clinical judgment Functional Limitations: Memory Memory Current Status (H4327): 0 percent impaired, limited or restricted Memory Goal Status (M1470): 0 percent impaired, limited or restricted Memory Discharge Status (L2957): 0 percent impaired, limited or restricted         Victor Waters 04/05/2017, 10:11 AM  Victor Waters, M.A. CCC-SLP [phone]

## 2017-04-05 NOTE — Care Management Note (Signed)
Case Management Note  Patient Details  Name: Victor Waters MRN: [id number] Date of Birth: 07-29-1926  Subjective/Objective:                    Action/Plan: Pt discharging home with self care. Pt with orders for [facility] services. CM met with the patient and his granddaughter. He refused HH services, granddaughter in agreement. CM also spoke to patients wife over the phone and she also refused [place] services.  Granddaughter to provide transportation home.   Expected Discharge Date:  04/05/17               Expected Discharge Plan:  [place]  In-House Referral:     Discharge planning Services  CM Consult  Post Acute Care Choice:  Home Health Choice offered to:  Patient, Spouse  DME Arranged:    DME Agency:     HH Arranged:  RN, PT, OT, Nurse's Aide (pt refused) HH Agency:     Status of Service:  Completed, signed off  If discussed at [place] of Stay Meetings, dates discussed:    Additional Comments:  Pollie Friar, RN 04/05/2017, 2:45 PM

## 2017-04-05 NOTE — Progress Notes (Signed)
*  PRELIMINARY RESULTS* Vascular Ultrasound Carotid Duplex (Doppler) has been completed.   Findings suggest 1-39% internal carotid artery stenosis bilaterally. Vertebral arteries are patent with antegrade flow.   Left lower extremity venous duplex completed. Left lower extremity is negative for deep vein thrombosis. There is no evidence of left Baker's cyst.  04/05/2017 11:51 AM Maudry Mayhew, BS, RVT, RDCS, RDMS

## 2017-04-05 NOTE — Care Management Obs Status (Signed)
[place] NOTIFICATION   Patient Details  Name: Victor Waters MRN: [id number] Date of Birth: Sep 24, 1926   Medicare Observation Status Notification Given:  Yes    Pollie Friar, RN 04/05/2017, 2:41 PM

## 2017-04-05 NOTE — Discharge Summary (Signed)
Physician Discharge Summary  COTE MAYABB LGX:[id number] DOB: [birth year] DOA: 04/04/2017  PCP: Lamar Blinks, MD  Admit date: 04/04/2017 Discharge date: 04/05/2017  Admitted From:home Disposition:home with home care  Recommendations for Outpatient Follow-up:  1. Follow up with PCP in 1-2 weeks 2. Please obtain BMP/CBC in one week  Home Health:yes Equipment/Devices:rolling walker Discharge Condition:stable CODE STATUS:full Diet recommendation:heart healthy  Brief/Interim Summary: 83 year old male with history of hypertension, chronic systolic congestive heart failure presented with dizziness, difficulty speaking and left facial droop. Patient's was seen at PCPs office when he was noted to be dizzy and near syncopal episode. Those symptoms are resolved during admission. CT scan of the head with no acute finding MRI of the brain/MRA of the brain with no acute finding. No high-grade stenosis or large vessel occlusion. Carotid Doppler ultrasound with no critical stenosis. Echocardiogram with improved ejection fraction compared to prior with no cardiac source of emboli. Patient was found to have elevated LDL therefore started on a statin. Continue aspirin. Patient remained clinically stable. He was evaluated by PT, OT. He is able to ambulate well without difficulties. He was alert awake and oriented 3. Plan to discharge home with home care services if patient agrees. Outpatient referral to neurologist. I discussed with Dr. Leonie Man from neurology.  Today he was found to have nonsustained V. tach. He was clinically asymptomatic. Echo with improved ejection fraction. Blood pressure acceptable. Magnesium level acceptable. Resume home dose of Coreg. Repeat heart rate acceptable. I do not think patient require further workup in the hospital. He follows with cardiologist closely. I recommended patient to follow-up with his PCP and cardiology outpatient. I recommended to monitor blood pressure  closely. Follow up pending A1c level.  Discharge Diagnoses:  Active Problems:   Essential hypertension   DIZZINESS   Insomnia   TIA (transient ischemic attack)   Facial droop   Localized swelling of lower extremity   BPH (benign prostatic hyperplasia)   Stroke-like symptoms    Discharge Instructions  Discharge Instructions    Ambulatory referral to Neurology    Complete by:  As directed    An appointment is requested in approximately: 4 weeks   Call MD for:  difficulty breathing, headache or visual disturbances    Complete by:  As directed    Call MD for:  extreme fatigue    Complete by:  As directed    Call MD for:  hives    Complete by:  As directed    Call MD for:  persistant dizziness or light-headedness    Complete by:  As directed    Call MD for:  persistant nausea and vomiting    Complete by:  As directed    Call MD for:  severe uncontrolled pain    Complete by:  As directed    Call MD for:  temperature >100.4    Complete by:  As directed    Diet - low sodium heart healthy    Complete by:  As directed    Discharge instructions    Complete by:  As directed    Please check BP at home and follow up with your PCP and cardiologist in 1-2 weeks.   For home use only DME 4 wheeled rolling walker with seat    Complete by:  As directed    Patient needs a walker to treat with the following condition:  Weakness   Increase activity slowly    Complete by:  As directed      Allergies as  of 04/05/2017      Reactions   Lisinopril Cough   Renal artery stenosis by history; ACE inhibitor  would be relatively contraindicated   Zithromax [azithromycin]    02/15/14 N&V   Niacin Other (See Comments)   Burning sensations in head      Medication List    TAKE these medications   aspirin 81 MG tablet Take 81 mg by mouth at bedtime.   atorvastatin 40 MG tablet Commonly known as:  LIPITOR Take 1 tablet (40 mg total) by mouth daily at 6 PM.   CALCIUM-MAGNESIUM-ZINC PO Take 1  tablet by mouth daily.   carvedilol 3.125 MG tablet Commonly known as:  COREG Take 1 tablet (3.125 mg total) by mouth 2 (two) times daily with a meal.   cholecalciferol 1000 units tablet Commonly known as:  VITAMIN D Take 2,000 Units by mouth daily.   ezetimibe 10 MG tablet Commonly known as:  ZETIA Take 0.5 tablets (5 mg total) by mouth daily.   Melatonin 3 MG Tbdp Take 1 tablet by mouth at bedtime as needed (sleep). Reported on 04/29/2016   nitroGLYCERIN 0.4 MG SL tablet Commonly known as:  NITROSTAT DISSOLVE 1 TAB UNDER TONGUE AS NEEDED FOR CHEST PAIN, MAY REPEAT IN 5 MINUTES AS DIRECTED. ER IF NO BETTER.   sacubitril-valsartan 49-51 MG Commonly known as:  ENTRESTO Take 1 tablet by mouth 2 (two) times daily.   tamsulosin 0.4 MG Caps capsule Commonly known as:  FLOMAX Take 1 capsule (0.4 mg total) by mouth daily.            Durable Medical Equipment        Start     Ordered   04/05/17 0000  For home use only DME 4 wheeled rolling walker with seat    Question:  Patient needs a walker to treat with the following condition  Answer:  Weakness   04/05/17 1250     Follow-up [place], MD. Schedule an appointment as soon as possible for a visit in 1 week(s).   Specialty:  Family Medicine Contact information: [address] [place] [address] [phone]          Allergies  Allergen Reactions  . Lisinopril Cough    Renal artery stenosis by history; ACE inhibitor  would be relatively contraindicated  . Zithromax [Azithromycin]     02/15/14 N&V  . Niacin Other (See Comments)    Burning sensations in head    Consultations: None  Procedures/Studies: MRI, echo, carotid Doppler  Subjective: Patient was seen and examined at bedside. Reported doing well. Denied headache, dizziness, weakness, numbness, facial droop or any new symptoms. Denied nausea vomiting chest pain or shortness of breath. Able to ambulate without  difficulties.  Discharge Exam: Vitals:   04/05/17 0930 04/05/17 1234  BP: 130/72   Pulse: 89 85  Resp: 18   Temp: 97.7 F (36.5 C)    Vitals:   04/05/17 0216 04/05/17 0619 04/05/17 0930 04/05/17 1234  BP: 133/74 (!) 141/82 130/72   Pulse: 86 99 89 85  Resp: 17 18 18    Temp:   97.7 F (36.5 C)   TempSrc:   Oral   SpO2: 97% 100% 98%   Weight:      Height:        General: Pt is alert, awake, not in acute distress Cardiovascular: RRR, S1/S2 +, no rubs, no gallops Respiratory: CTA bilaterally, no wheezing, no rhonchi Abdominal: Soft, NT,  ND, bowel sounds + Extremities: no edema, no cyanosis Neurology: Alert, awake, muscle strength 5 over 5 in all extremities, sensory exam to touch normal, oriented 3.   The results of significant diagnostics from this hospitalization (including imaging, microbiology, ancillary and laboratory) are listed below for reference.     Microbiology: Recent Results (from the past 240 hour(s))  Urine Culture     Status: None   Collection Time: 03/29/17  2:04 PM  Result Value Ref Range Status   Organism ID, Bacteria NO GROWTH  Final     Labs: BNP (last 3 results)  Recent Labs  08/31/16 0810  BNP 194.1*   Basic Metabolic Panel:  Recent Labs Lab 04/04/17 0959 04/05/17 0543  NA 135 137  K 3.8 3.9  CL 105 107  CO2 24 22  GLUCOSE 123* 104*  BUN 27* 21*  CREATININE 0.88 0.87  CALCIUM 9.0 9.0  MG  --  2.1   Liver Function Tests:  Recent Labs Lab 04/04/17 0959 04/05/17 0543  AST 20 19  ALT 18 15*  ALKPHOS 40 42  BILITOT 1.2 1.0  PROT 6.1* 5.7*  ALBUMIN 3.6 3.5   No results for input(s): LIPASE, AMYLASE in the last 168 hours. No results for input(s): AMMONIA in the last 168 hours. CBC:  Recent Labs Lab 04/04/17 0959 04/05/17 0543  WBC 5.1 4.3  NEUTROABS 3.7  --   HGB 13.4 13.6  HCT 39.2 41.1  MCV 90.3 89.5  PLT 122* 116*   Cardiac Enzymes:  Recent Labs Lab 04/04/17 0959  TROPONINI <0.03   BNP: Invalid  input(s): POCBNP CBG: No results for input(s): GLUCAP in the last 168 hours. D-Dimer No results for input(s): DDIMER in the last 72 hours. Hgb A1c No results for input(s): HGBA1C in the last 72 hours. Lipid Profile  Recent Labs  04/05/17 0543  CHOL 224*  HDL 38*  LDLCALC 168*  TRIG 88  CHOLHDL 5.9   Thyroid function studies No results for input(s): TSH, T4TOTAL, T3FREE, THYROIDAB in the last 72 hours.  Invalid input(s): FREET3 Anemia work up No results for input(s): VITAMINB12, FOLATE, FERRITIN, TIBC, IRON, RETICCTPCT in the last 72 hours. Urinalysis    Component Value Date/Time   COLORURINE YELLOW 04/04/2017 1041   APPEARANCEUR CLEAR 04/04/2017 1041   LABSPEC 1.016 04/04/2017 1041   PHURINE 5.5 04/04/2017 1041   GLUCOSEU NEGATIVE 04/04/2017 1041   GLUCOSEU NEGATIVE 03/29/2017 1404   HGBUR MODERATE (A) 04/04/2017 1041   HGBUR negative 07/06/2010 1402   BILIRUBINUR NEGATIVE 04/04/2017 1041   KETONESUR NEGATIVE 04/04/2017 1041   PROTEINUR NEGATIVE 04/04/2017 1041   UROBILINOGEN 0.2 03/29/2017 1404   NITRITE NEGATIVE 04/04/2017 1041   LEUKOCYTESUR NEGATIVE 04/04/2017 1041   Sepsis Labs Invalid input(s): PROCALCITONIN,  WBC,  LACTICIDVEN Microbiology Recent Results (from the past 240 hour(s))  Urine Culture     Status: None   Collection Time: 03/29/17  2:04 PM  Result Value Ref Range Status   Organism ID, Bacteria NO GROWTH  Final     Time coordinating discharge: 28 minutes  SIGNED:   Rosita Fire, MD  [facility] 04/05/2017, 12:50 PM  If 7PM-7AM, please contact night-coverage [email or web address] Password [email or web address]

## 2017-04-05 NOTE — Care Management Note (Signed)
Case Management Note  Patient Details  Name: Victor Waters MRN: [id number] Date of Birth: 21-Sep-1926  Subjective/Objective:          Patient presented with a 3 day history of intermittent confusion, dizziness, difficulty speaking and left facial droop. Lives at home with spouse.  CM will follow for discharge needs pending therapy evals and physician orders.           Action/Plan:   Expected Discharge Date:   (Pending)               Expected Discharge Plan:     In-House Referral:     Discharge planning Services     Post Acute Care Choice:    Choice offered to:     DME Arranged:    DME Agency:     HH Arranged:    HH Agency:     Status of Service:     If discussed at [employer] of Stay Meetings, dates discussed:    Additional Comments:  Rolm Baptise, RN 04/05/2017, 11:06 AM

## 2017-04-05 NOTE — Evaluation (Signed)
Physical Therapy Evaluation Patient Details Name: Victor Waters MRN: [id number] DOB: January 16, 1926 Today's Date: 04/05/2017   History of Present Illness  This 83 y.o. male admitted with intermittent confusion, dizziness, difficulty speaking and Lt facial droop.  MRI negative for acute and early subacute infarct.   PMH includes:  essential HTN, h/o TIA, seizures, CAD   Clinical Impression  Pt admitted with above diagnosis. Pt currently with functional limitations due to the deficits listed below (see PT Problem List). PTA, pt was independent with functional mobility without AD. Upon evaluation, pt presenting with decreased steadiness and generalized weakness, requiring min guard to min A for steadying during functional mobility tasks. Educated about use of RW at home to increase stability. Educated about outpatient PT recommendations to increase stability and safety with mobility.  Pt will benefit from skilled PT to increase their independence and safety with mobility to allow discharge to the venue listed below.  Will continue to follow to maximize functional mobility independence.      Follow Up Recommendations Outpatient PT;Supervision/Assistance - 24 hour    Equipment Recommendations  Rolling walker with 5" wheels    Recommendations for Other Services       Precautions / Restrictions Precautions Precautions: Fall Restrictions Weight Bearing Restrictions: No      Mobility  Bed Mobility Overal bed mobility: Modified Independent             General bed mobility comments: Increased time, however, no assist.   Transfers Overall transfer level: Needs assistance Equipment used: None Transfers: Sit to/from Stand Sit to Stand: Min assist         General transfer comment: Pt demonstrating decreased steadiness upon standing, stating, his legs were stiff. Min A required for steadying.   Ambulation/Gait Ambulation/Gait assistance: Min assist;Min guard Ambulation Distance  (Feet): 150 Feet Assistive device: None Gait Pattern/deviations: Step-through pattern;Decreased stride length;Drifts right/left;Trunk flexed Gait velocity: Decreased Gait velocity interpretation: Below normal speed for age/gender General Gait Details: Slow, unsteady gait without AD. Min guard to occaisional min A required for steadying throughout gait. Pt reporting he feels like he is more unsteady. Verbal cues for upright posture. Educated about use of RW at home to increase stability.   Stairs Stairs: Yes Stairs assistance: Min guard Stair Management: Two rails;Step to pattern;Forwards Number of Stairs: 2 General stair comments: Min guard for safety with stair management. Verbal cues for use of step to technique.   Wheelchair Mobility    Modified Rankin (Stroke Patients Only)       Balance Overall balance assessment: Needs assistance Sitting-balance support: No upper extremity supported;Feet supported Sitting balance-Leahy Scale: Good     Standing balance support: No upper extremity supported;During functional activity Standing balance-Leahy Scale: Fair Standing balance comment: Unsteadiness with functional mobility without UE support                             Pertinent Vitals/Pain Pain Assessment: No/denies pain    Home Living Family/patient expects to be discharged to:: Private residence Living Arrangements: Spouse/significant other Available Help at Discharge: Family;Available 24 hours/day Type of Home: House Home Access: Stairs to enter Entrance Stairs-Rails: Right;Left;Can reach both Entrance Stairs-Number of Steps: 5 Home Layout: One level Home Equipment: Cane - single point;Grab bars - tub/shower;Shower seat;Walker - 4 wheels      Prior Function Level of Independence: Independent         Comments: Pt full independent except driving, which wife does, due  to pt with h/o seizures.       Hand Dominance   Dominant Hand: Right     Extremity/Trunk Assessment   Upper Extremity Assessment Upper Extremity Assessment: Defer to OT evaluation    Lower Extremity Assessment Lower Extremity Assessment: Generalized weakness (Grossly 4/5 throughout BLE )    Cervical / Trunk Assessment Cervical / Trunk Assessment: Kyphotic  Communication   Communication: No difficulties  Cognition Arousal/Alertness: Awake/alert Behavior During Therapy: WFL for tasks assessed/performed Overall Cognitive Status: Within Functional Limits for tasks assessed                                        General Comments General comments (skin integrity, edema, etc.): Educated about outpatient PT to address balance deficits. Pt agreeable stating he would like to go to get back to normal.     Exercises General Exercises - Lower Extremity Ankle Circles/Pumps: AROM;Both;20 reps;Seated Long Arc Quad: AROM;Both;10 reps;Seated Hip Flexion/Marching: AROM;Both;10 reps;Seated   Assessment/Plan    PT Assessment Patient needs continued PT services  PT Problem List Decreased strength;Decreased balance;Decreased mobility;Decreased knowledge of use of DME       PT Treatment Interventions DME instruction;Gait training;Stair training;Functional mobility training;Therapeutic activities;Therapeutic exercise;Balance training;Neuromuscular re-education;Patient/family education    PT Goals (Current goals can be found in the Care Plan section)  Acute Rehab PT Goals Patient Stated Goal: to get back to normal  PT Goal Formulation: With patient Time For Goal Achievement: 04/12/17 Potential to Achieve Goals: Good    Frequency Min 3X/week   Barriers to discharge        Co-evaluation               End of Session Equipment Utilized During Treatment: Gait belt Activity Tolerance: Patient tolerated treatment well Patient left: in chair;with call bell/phone within reach;with chair alarm set;Other (comment) (SLP in room ) Nurse  Communication: Mobility status PT Visit Diagnosis: Unsteadiness on feet (R26.81);Muscle weakness (generalized) (M62.81)    Time: 0349-1791 PT Time Calculation (min) (ACUTE ONLY): 18 min   Charges:   PT Evaluation $PT Eval Low Complexity: 1 Procedure PT Treatments $Gait Training: 8-22 mins   PT G Codes:   PT G-Codes **NOT FOR INPATIENT CLASS** Functional Assessment Tool Used: AM-PAC 6 Clicks Basic Mobility;Clinical judgement Functional Limitation: Mobility: Walking and moving around Mobility: Walking and Moving Around Current Status (T0569): At least 40 percent but less than 60 percent impaired, limited or restricted Mobility: Walking and Moving Around Goal Status [phone]): At least 1 percent but less than 20 percent impaired, limited or restricted    Nicky Pugh, PT, DPT  Acute Rehabilitation Services  Pager: [place] 04/05/2017, 9:51 AM

## 2017-04-05 NOTE — Progress Notes (Signed)
Pt d/c to home by car with family. Assessment stable. Prescription given. All questions answered 

## 2017-04-05 NOTE — Discharge Instructions (Signed)
Stroke Prevention Some health problems and behaviors may make it more likely for you to have a stroke. Below are ways to lessen your risk of having a stroke.  Be active for at least 30 minutes on most or all days.  Do not smoke. Try not to be around others who smoke.  Do not drink too much alcohol.  Do not have more than 2 drinks a day if you are a man.  Do not have more than 1 drink a day if you are a woman and are not pregnant.  Eat healthy foods, such as fruits and vegetables. If you were put on a specific diet, follow the diet as told.  Keep your cholesterol levels under control through diet and medicines. Look for foods that are low in saturated fat, trans fat, cholesterol, and are high in fiber.  If you have diabetes, follow all diet plans and take your medicine as told.  Ask your doctor if you need treatment to lower your blood pressure. If you have high blood pressure (hypertension), follow all diet plans and take your medicine as told by your doctor.  If you are 75-50 years old, have your blood pressure checked every 3-5 years. If you are age 61 or older, have your blood pressure checked every year.  Keep a healthy weight. Eat foods that are low in calories, salt, saturated fat, trans fat, and cholesterol.  Do not take drugs.  Avoid birth control pills, if this applies. Talk to your doctor about the risks of taking birth control pills.  Talk to your doctor if you have sleep problems (sleep apnea).  Take all medicine as told by your doctor.  You may be told to take aspirin or blood thinner medicine. Take this medicine as told by your doctor.  Understand your medicine instructions.  Make sure any other conditions you have are being taken care of. Get help right away if:  You suddenly lose feeling (you feel numb) or have weakness in your face, arm, or leg.  Your face or eyelid hangs down to one side.  You suddenly feel confused.  You have trouble talking (aphasia)  or understanding what people are saying.  You suddenly have trouble seeing in one or both eyes.  You suddenly have trouble walking.  You are dizzy.  You lose your balance or your movements are clumsy (uncoordinated).  You suddenly have a very bad headache and you do not know the cause.  You have new chest pain.  Your heart feels like it is fluttering or skipping a beat (irregular heartbeat). Do not wait to see if the symptoms above go away. Get help right away. Call your local emergency services (911 in [place] Do not drive yourself to the hospital. This information is not intended to replace advice given to you by your health care provider. Make sure you discuss any questions you have with your health care provider. Document Released: 06/06/2012 Document Revised: 05/13/2016 Document Reviewed: 06/08/2013 Elsevier Interactive Patient Education  2017 [employer] Transient Ischemic Attack A transient ischemic attack (TIA) is a "warning stroke" that causes stroke-like symptoms. Unlike a stroke, a TIA does not cause permanent damage to the brain. The symptoms of a TIA can happen very fast and do not last long. It is important to know the symptoms of a TIA and what to do. This can help prevent a major stroke or death. What are the causes? A TIA is caused by a temporary blockage in an artery in  the brain or neck (carotid artery). The blockage does not allow the brain to get the blood supply it needs and can cause different symptoms. The blockage can be caused by either:  A blood clot.  Fatty buildup (plaque) in a neck or brain artery. What increases the risk?  High blood pressure (hypertension).  High cholesterol.  Diabetes mellitus.  Heart disease.  The buildup of plaque in the blood vessels (peripheral artery disease or atherosclerosis).  The buildup of plaque in the blood vessels that provide blood and oxygen to the brain (carotid artery stenosis).  An abnormal heart rhythm  (atrial fibrillation).  Obesity.  Using any tobacco products, including cigarettes, chewing tobacco, or electronic cigarettes.  Taking oral contraceptives, especially in combination with using tobacco.  Physical inactivity.  A diet high in fats, salt (sodium), and calories.  Excessive alcohol use.  Use of illegal drugs (especially cocaine and methamphetamine).  Being male.  Being African American.  Being over the age of 67 years.  Family history of stroke.  Previous history of blood clots, stroke, TIA, or heart attack.  Sickle cell disease. What are the signs or symptoms? TIA symptoms are the same as a stroke but are temporary. These symptoms usually develop suddenly, or may be newly present upon waking from sleep:  Sudden weakness or numbness of the face, arm, or leg, especially on one side of the body.  Sudden trouble walking or difficulty moving arms or legs.  Sudden confusion.  Sudden personality changes.  Trouble speaking (aphasia) or understanding.  Difficulty swallowing.  Sudden trouble seeing in one or both eyes.  Double vision.  Dizziness.  Loss of balance or coordination.  Sudden severe headache with no known cause.  Trouble reading or writing.  Loss of bowel or bladder control.  Loss of consciousness. How is this diagnosed? Your health care provider may be able to determine the presence or absence of a TIA based on your symptoms, history, and physical exam. CT scan of the brain is usually performed to help identify a TIA. Other tests may include:  Electrocardiography (ECG).  Continuous heart monitoring.  Echocardiography.  Carotid ultrasonography.  MRI.  A scan of the brain circulation.  Blood tests. How is this treated? Since the symptoms of TIA are the same as a stroke, it is important to seek treatment as soon as possible. You may need a medicine to dissolve a blood clot (thrombolytic) if that is the cause of the TIA. This  medicine cannot be given if too much time has passed. Treatment may also include:  Rest, oxygen, fluids through an IV tube, and medicines to thin the blood (anticoagulants).  Measures will be taken to prevent short-term and long-term complications, including infection from breathing foreign material into the lungs (aspiration pneumonia), blood clots in the legs, and falls.  Procedures to either remove plaque in the carotid arteries or dilate carotid arteries that have narrowed due to plaque. Those procedures are:  Carotid endarterectomy.  Carotid angioplasty and stenting.  Medicines and diet may be used to address diabetes, high blood pressure, and other underlying risk factors. Follow these instructions at home:  Take medicines only as directed by your health care provider. Follow the directions carefully. Medicines may be used to control risk factors for a stroke. Be sure you understand all your medicine instructions.  You may be told to take aspirin or the anticoagulant warfarin. Warfarin needs to be taken exactly as instructed.  Taking too much or too little warfarin is  dangerous. Too much warfarin increases the risk of bleeding. Too little warfarin continues to allow the risk for blood clots. While taking warfarin, you will need to have regular blood tests to measure your blood clotting time. A PT blood test measures how long it takes for blood to clot. Your PT is used to calculate another value called an INR. Your PT and INR help your health care provider to adjust your dose of warfarin. The dose can change for many reasons. It is critically important that you take warfarin exactly as prescribed.  Many foods, especially foods high in vitamin K can interfere with warfarin and affect the PT and INR. Foods high in vitamin K include spinach, kale, broccoli, cabbage, collard and turnip greens, Brussels sprouts, peas, cauliflower, seaweed, and parsley, as well as beef and pork liver, green tea,  and soybean oil. You should eat a consistent amount of foods high in vitamin K. Avoid major changes in your diet, or notify your health care provider before changing your diet. Arrange a visit with a dietitian to answer your questions.  Many medicines can interfere with warfarin and affect the PT and INR. You must tell your health care provider about any and all medicines you take; this includes all vitamins and supplements. Be especially cautious with aspirin and anti-inflammatory medicines. Do not take or discontinue any prescribed or over-the-counter medicine except on the advice of your health care provider or pharmacist.  Warfarin can have side effects, such as excessive bruising or bleeding. You will need to hold pressure over cuts for longer than usual. Your health care provider or pharmacist will discuss other potential side effects.  Avoid sports or activities that may cause injury or bleeding.  Be careful when shaving, flossing your teeth, or handling sharp objects.  Alcohol can change the body's ability to handle warfarin. It is best to avoid alcoholic drinks or consume only very small amounts while taking warfarin. Notify your health care provider if you change your alcohol intake.  Notify your dentist or other health care providers before procedures.  Eat a diet that includes 5 or more servings of fruits and vegetables each day. This may reduce the risk of stroke. Certain diets may be prescribed to address high blood pressure, high cholesterol, diabetes, or obesity.  A diet low in sodium, saturated fat, trans fat, and cholesterol is recommended to manage high blood pressure.  A diet low in saturated fat, trans fat, and cholesterol, and high in fiber may control cholesterol levels.  A controlled-carbohydrate, controlled-sugar diet is recommended to manage diabetes.  A reduced-calorie diet that is low in sodium, saturated fat, trans fat, and cholesterol is recommended to manage  obesity.  Maintain a healthy weight.  Stay physically active. It is recommended that you get at least 30 minutes of activity on most or all days.  Do not use any tobacco products, including cigarettes, chewing tobacco, or electronic cigarettes. If you need help quitting, ask your health care provider.  Limit alcohol intake to no more than 1 drink per day for nonpregnant women and 2 drinks per day for men. One drink equals 12 ounces of beer, 5 ounces of wine, or 1 ounces of hard liquor.  Do not abuse drugs.  A safe home environment is important to reduce the risk of falls. Your health care provider may arrange for specialists to evaluate your home. Having grab bars in the bedroom and bathroom is often important. Your health care provider may arrange for equipment to  be used at home, such as raised toilets and a seat for the shower.  Follow all instructions for follow-up with your health care provider. This is very important. This includes any referrals and lab tests. Proper follow-up can prevent a stroke or another TIA from occurring. How is this prevented? The risk of a TIA can be decreased by appropriately treating high blood pressure, high cholesterol, diabetes, heart disease, and obesity, and by quitting smoking, limiting alcohol, and staying physically active. Contact a health care provider if:  You have personality changes.  You have difficulty swallowing.  You are seeing double.  You have dizziness.  You have a fever. Get help right away if: Any of the following symptoms may represent a serious problem that is an emergency. Do not wait to see if the symptoms will go away. Get medical help right away. Call your local emergency services (911 in [place] Do not drive yourself to the hospital.  You have sudden weakness or numbness of the face, arm, or leg, especially on one side of the body.  You have sudden trouble walking or difficulty moving arms or legs.  You have sudden  confusion.  You have trouble speaking (aphasia) or understanding.  You have sudden trouble seeing in one or both eyes.  You have a loss of balance or coordination.  You have a sudden, severe headache with no known cause.  You have new chest pain or an irregular heartbeat.  You have a partial or total loss of consciousness. This information is not intended to replace advice given to you by your health care provider. Make sure you discuss any questions you have with your health care provider. Document Released: 09/15/2005 Document Revised: 08/09/2016 Document Reviewed: 03/13/2014 Elsevier Interactive Patient Education  2017 [employer].

## 2017-04-06 ENCOUNTER — Telehealth: Payer: Self-pay | Admitting: Behavioral Health

## 2017-04-06 LAB — HEMOGLOBIN A1C
Hgb A1c MFr Bld: 5.9 % — ABNORMAL HIGH (ref 4.8–5.6)
Mean Plasma Glucose: 123 mg/dL

## 2017-04-06 NOTE — Telephone Encounter (Signed)
Patient declines TCM/Hospital Follow-up at this time. He stated, "I'm doing just fine & my head feels better, just needed a little rest". Advised patient to call the office if his condition changes or symptoms reoccur. Patient voiced understanding.

## 2017-04-28 ENCOUNTER — Other Ambulatory Visit: Payer: Self-pay | Admitting: Cardiology

## 2017-04-28 NOTE — Telephone Encounter (Signed)
REFILL 

## 2017-07-19 ENCOUNTER — Encounter: Payer: Self-pay | Admitting: Internal Medicine

## 2017-07-19 ENCOUNTER — Ambulatory Visit (INDEPENDENT_AMBULATORY_CARE_PROVIDER_SITE_OTHER): Payer: Medicare HMO | Admitting: Internal Medicine

## 2017-07-19 VITALS — BP 128/84 | HR 68 | Temp 97.4°F | Resp 14 | Ht 63.0 in | Wt 141.0 lb

## 2017-07-19 DIAGNOSIS — R3989 Other symptoms and signs involving the genitourinary system: Secondary | ICD-10-CM

## 2017-07-19 DIAGNOSIS — R399 Unspecified symptoms and signs involving the genitourinary system: Secondary | ICD-10-CM

## 2017-07-19 DIAGNOSIS — R310 Gross hematuria: Secondary | ICD-10-CM | POA: Diagnosis not present

## 2017-07-19 NOTE — Progress Notes (Signed)
Pre visit review using our clinic review tool, if applicable. No additional management support is needed unless otherwise documented below in the visit note. 

## 2017-07-19 NOTE — Progress Notes (Signed)
Subjective:    Patient ID: Victor Waters, male    DOB: 1926-06-14, 83 y.o.   MRN: [id number]  DOS:  07/19/2017 Type of visit - description : acute Interval history: Reports many year history of "slow urine". he is concerned because in the last 2 or 3 months has seen blood in the urine 3 or 4 times.   Takes Flomax but that has not changed his symptoms.  Review of Systems No fever chills No weight loss Has back pain, chronic, slightly worse lately?Marland Kitchen He feels he is able to empty his bladder okay, +mild dysuria. Never a smoker.   Past Medical History:  Diagnosis Date  . Anxiety   . Coronary artery disease    a. s/p CABG;  b. LHC (2/11):  LM 30-40, pLAD 95-99 then 80 and 90, mCFX 90, pRCA occluded, S-dRCA ok, S-D1 ok, S-OM patent with 50, L-LAD ok.  Med Rx recommended.  c.  Myoview (5/13):  Low risk with small fixed inf defect c/w scar, no ischemia, EF 48%.  d.  Echo (6/13):  EF 55-60%, inf HK, Gr 1 DD, MAC;  e. Lexiscan Myoview (10/14):  Low risk, EF 38%, inferobasal infarct, no ischemia.  . Fatigue   . Hx of echocardiogram    Echo (10/14):  EF 55-60%, Gr 1DD, MAC, mild MR, mild LAE.  Marland Kitchen Hyperlipidemia   . Hypertension   . Hyponatremia 09/2012   Associated with postural hypotension and confusion  . Insomnia   . Myocardial infarction ([place]) 2003   hx of   . Renal artery stenosis ([facility])    bilateral;  s/p R RA stent 2011  . Seizure ([place]) 2015   unknow etiology and year of seizure???  . Syncope and collapse 08/31/2016  . Systolic heart failure ([facility])    CHRONIC  . TIA (transient ischemic attack)     Past Surgical History:  Procedure Laterality Date  . CARDIAC CATHETERIZATION  2003   revdeling severe three-vessel disease  . CORONARY ARTERY BYPASS GRAFT     LIMA to LAD, SVG to diagonal, SVG to RCA, and SVG to circumflex  . POLYPECTOMY     colon  . RENAL ARTERY STENT  01-28-10    Social History   Social History  . Marital status: Married    Spouse name: N/A  . Number of  children: N/A  . Years of education: N/A   Occupational History  . Not on file.   Social History Main Topics  . Smoking status: Never Smoker  . Smokeless tobacco: Never Used  . Alcohol use No  . Drug use: No  . Sexual activity: Not on file   Other Topics Concern  . Not on file   Social History Narrative   Married   Lives with wife and 39year old ggd      Allergies as of 07/19/2017      Reactions   Lisinopril Cough   Renal artery stenosis by history; ACE inhibitor  would be relatively contraindicated   Zithromax [azithromycin]    02/15/14 N&V   Niacin Other (See Comments)   Burning sensations in head      Medication List       Accurate as of 07/19/17 11:59 PM. Always use your most recent med list.          aspirin 81 MG tablet Take 81 mg by mouth at bedtime.   atorvastatin 40 MG tablet Commonly known as:  LIPITOR Take 1 tablet (40 mg total) by  mouth daily at 6 PM.   CALCIUM-MAGNESIUM-ZINC PO Take 1 tablet by mouth daily.   carvedilol 3.125 MG tablet Commonly known as:  COREG Take 1 tablet (3.125 mg total) by mouth 2 (two) times daily with a meal.   cholecalciferol 1000 units tablet Commonly known as:  VITAMIN D Take 2,000 Units by mouth daily.   ENTRESTO 49-51 MG Generic drug:  sacubitril-valsartan take 1 tablet by mouth twice a day   ezetimibe 10 MG tablet Commonly known as:  ZETIA Take 0.5 tablets (5 mg total) by mouth daily.   Melatonin 3 MG Tbdp Take 1 tablet by mouth at bedtime as needed (sleep). Reported on 04/29/2016   nitroGLYCERIN 0.4 MG SL tablet Commonly known as:  NITROSTAT DISSOLVE 1 TAB UNDER TONGUE AS NEEDED FOR CHEST PAIN, MAY REPEAT IN 5 MINUTES AS DIRECTED. ER IF NO BETTER.   tamsulosin 0.4 MG Caps capsule Commonly known as:  FLOMAX Take 1 capsule (0.4 mg total) by mouth daily.          Objective:   Physical Exam BP 128/84 (BP Location: Right Arm, Patient Position: Sitting, Cuff Size: Small)   Pulse 68   Temp (!) 97.4 F  (36.3 C) (Oral)   Resp 14   Ht _0  (1.6 m)   Wt 141 lb (64 kg)   SpO2 98%   BMI 24.98 kg/m  General:   Well developed, well nourished . NAD.  HEENT:  Normocephalic . Face symmetric, atraumatic. No LAD sore mass Lungs:  CTA B Normal respiratory effort, no intercostal retractions, no accessory muscle use. Heart: RRR,  no murmur.  no pretibial edema bilaterally  Abdomen:  Not distended, soft, non-tender. No rebound or rigidity.  DRE: No stool sounds, sphincter normal. Prostate enlarged, more so on the left, ++ induration. Skin: Not pale. Not jaundice Neurologic:  alert & oriented X3.  Speech normal, gait appropriate for age and unassisted Psych--  Cognition and judgment appear intact.  Cooperative with normal attention span and concentration.  Behavior appropriate. No anxious or depressed appearing.    Assessment & Plan:    Hematuria, abnormal prostate exam: Needs to see urology, suspect prostate cancer versus others. will refer. Check a BMP, PSA, UA, urine culture

## 2017-07-20 LAB — URINE CULTURE: ORGANISM ID, BACTERIA: NO GROWTH

## 2017-07-20 LAB — BASIC METABOLIC PANEL
BUN: 28 mg/dL — ABNORMAL HIGH (ref 6–23)
CALCIUM: 9.3 mg/dL (ref 8.4–10.5)
CO2: 25 mEq/L (ref 19–32)
CREATININE: 1.07 mg/dL (ref 0.40–1.50)
Chloride: 101 mEq/L (ref 96–112)
GFR: 69.1 mL/min (ref >60.00)
Glucose, Bld: 97 mg/dL (ref 70–99)
Potassium: 4.5 mEq/L (ref 3.5–5.1)
Sodium: 134 mEq/L — ABNORMAL LOW (ref 135–145)

## 2017-07-20 LAB — CBC WITH DIFFERENTIAL/PLATELET
BASOS ABS: 0 10*3/uL (ref 0.0–0.1)
Basophils Relative: 0.6 % (ref 0.0–3.0)
EOS ABS: 0.2 10*3/uL (ref 0.0–0.7)
Eosinophils Relative: 4.7 % (ref 0.0–5.0)
HEMATOCRIT: 40.7 % (ref 39.0–52.0)
HEMOGLOBIN: 13.4 g/dL (ref 13.0–17.0)
LYMPHS PCT: 14.2 % (ref 12.0–46.0)
Lymphs Abs: 0.6 10*3/uL — ABNORMAL LOW (ref 0.7–4.0)
MCHC: 32.9 g/dL (ref 30.0–36.0)
MCV: 92.7 fl (ref 78.0–100.0)
Monocytes Absolute: 0.6 10*3/uL (ref 0.1–1.0)
Monocytes Relative: 14.3 % — ABNORMAL HIGH (ref 3.0–12.0)
NEUTROS ABS: 2.8 10*3/uL (ref 1.4–7.7)
Neutrophils Relative %: 66.2 % (ref 43.0–77.0)
Platelets: 141 10*3/uL — ABNORMAL LOW (ref 150.0–400.0)
RBC: 4.39 Mil/uL (ref 4.22–5.81)
RDW: 15 % (ref 11.5–15.5)
WBC: 4.3 10*3/uL (ref 4.0–10.5)

## 2017-07-20 LAB — PSA: PSA: 151.79 ng/mL — ABNORMAL HIGH (ref 0.10–4.00)

## 2017-07-21 NOTE — Addendum Note (Signed)
Addended by: Caffie Pinto on: 07/21/2017 02:52 PM   Modules accepted: Orders

## 2017-09-17 ENCOUNTER — Other Ambulatory Visit: Payer: Self-pay | Admitting: Cardiology

## 2017-09-19 DIAGNOSIS — R310 Gross hematuria: Secondary | ICD-10-CM | POA: Diagnosis not present

## 2017-09-19 DIAGNOSIS — R972 Elevated prostate specific antigen [PSA]: Secondary | ICD-10-CM | POA: Diagnosis not present

## 2017-12-22 ENCOUNTER — Ambulatory Visit (INDEPENDENT_AMBULATORY_CARE_PROVIDER_SITE_OTHER): Payer: Medicare HMO

## 2017-12-22 DIAGNOSIS — Z23 Encounter for immunization: Secondary | ICD-10-CM | POA: Diagnosis not present

## 2018-01-11 ENCOUNTER — Telehealth: Payer: Self-pay | Admitting: Family Medicine

## 2018-01-11 DIAGNOSIS — C61 Malignant neoplasm of prostate: Secondary | ICD-10-CM | POA: Diagnosis not present

## 2018-01-11 DIAGNOSIS — R972 Elevated prostate specific antigen [PSA]: Secondary | ICD-10-CM | POA: Diagnosis not present

## 2018-01-11 NOTE — Telephone Encounter (Signed)
Copied from [place] [phone] [id number]. Topic: Referral - Status >> Jan 11, 2018 10:45 AM Cecelia Byars, NT wrote: Reason for [id number] called  to verify a referral was sent to see Dr Lucillie Garfinkel  please call [phone] to give information to the practice ,thanks

## 2018-01-11 NOTE — Telephone Encounter (Signed)
Pt had appt on 01/11/18 at 12:30

## 2018-01-17 ENCOUNTER — Other Ambulatory Visit: Payer: Self-pay | Admitting: Urology

## 2018-01-17 DIAGNOSIS — C61 Malignant neoplasm of prostate: Secondary | ICD-10-CM

## 2018-01-30 ENCOUNTER — Encounter (HOSPITAL_COMMUNITY)
Admission: RE | Admit: 2018-01-30 | Discharge: 2018-01-30 | Disposition: A | Discharge status: Home / Self Care | Payer: Medicare HMO | Source: Ambulatory Visit | Attending: Urology | Admitting: Urology

## 2018-01-30 DIAGNOSIS — C61 Malignant neoplasm of prostate: Secondary | ICD-10-CM | POA: Diagnosis not present

## 2018-01-30 MED ORDER — TECHNETIUM TC 99M MEDRONATE IV KIT
21.9000 | PACK | Freq: Once | INTRAVENOUS | Status: AC | PRN
  Administered 2018-01-30: 21.9 via INTRAVENOUS

## 2018-02-06 DIAGNOSIS — C61 Malignant neoplasm of prostate: Secondary | ICD-10-CM | POA: Diagnosis not present

## 2018-02-17 DIAGNOSIS — Z5111 Encounter for antineoplastic chemotherapy: Secondary | ICD-10-CM | POA: Diagnosis not present

## 2018-02-17 DIAGNOSIS — C61 Malignant neoplasm of prostate: Secondary | ICD-10-CM | POA: Diagnosis not present

## 2018-03-05 ENCOUNTER — Other Ambulatory Visit: Payer: Self-pay | Admitting: Family Medicine

## 2018-03-05 ENCOUNTER — Other Ambulatory Visit: Payer: Self-pay | Admitting: Cardiology

## 2018-03-20 DIAGNOSIS — C61 Malignant neoplasm of prostate: Secondary | ICD-10-CM | POA: Diagnosis not present

## 2018-05-29 ENCOUNTER — Telehealth: Payer: Self-pay | Admitting: *Deleted

## 2018-05-29 ENCOUNTER — Encounter: Payer: Self-pay | Admitting: Family Medicine

## 2018-05-29 ENCOUNTER — Ambulatory Visit (INDEPENDENT_AMBULATORY_CARE_PROVIDER_SITE_OTHER): Payer: Medicare HMO | Admitting: Family Medicine

## 2018-05-29 VITALS — BP 118/60 | HR 98 | Resp 16 | Ht 63.0 in | Wt 139.0 lb

## 2018-05-29 DIAGNOSIS — C61 Malignant neoplasm of prostate: Secondary | ICD-10-CM

## 2018-05-29 DIAGNOSIS — R531 Weakness: Secondary | ICD-10-CM | POA: Diagnosis not present

## 2018-05-29 LAB — CBC
HCT: 41.3 % (ref 39.0–52.0)
HEMOGLOBIN: 14 g/dL (ref 13.0–17.0)
MCHC: 33.9 g/dL (ref 30.0–36.0)
MCV: 91.4 fl (ref 78.0–100.0)
PLATELETS: 148 10*3/uL — AB (ref 150.0–400.0)
RBC: 4.52 Mil/uL (ref 4.22–5.81)
RDW: 14.6 % (ref 11.5–15.5)
WBC: 4.5 10*3/uL (ref 4.0–10.5)

## 2018-05-29 LAB — COMPREHENSIVE METABOLIC PANEL
ALT: 15 U/L (ref 0–53)
AST: 15 U/L (ref 0–37)
Albumin: 3.9 g/dL (ref 3.5–5.2)
Alkaline Phosphatase: 46 U/L (ref 39–117)
BILIRUBIN TOTAL: 0.6 mg/dL (ref 0.2–1.2)
BUN: 26 mg/dL — ABNORMAL HIGH (ref 6–23)
CALCIUM: 9.6 mg/dL (ref 8.4–10.5)
CO2: 29 meq/L (ref 19–32)
Chloride: 100 mEq/L (ref 96–112)
Creatinine, Ser: 1.04 mg/dL (ref 0.40–1.50)
GFR: 71.27 mL/min (ref >60.00)
Glucose, Bld: 100 mg/dL — ABNORMAL HIGH (ref 70–99)
POTASSIUM: 4.8 meq/L (ref 3.5–5.1)
Sodium: 135 mEq/L (ref 135–145)
Total Protein: 5.9 g/dL — ABNORMAL LOW (ref 6.0–8.3)

## 2018-05-29 LAB — POCT URINALYSIS DIP (MANUAL ENTRY)
Bilirubin, UA: NEGATIVE
GLUCOSE UA: NEGATIVE mg/dL
Ketones, POC UA: NEGATIVE mg/dL
LEUKOCYTES UA: NEGATIVE
Nitrite, UA: NEGATIVE
PH UA: 6 (ref 5.0–8.0)
Protein Ur, POC: NEGATIVE mg/dL
RBC UA: NEGATIVE
Spec Grav, UA: 1.025 (ref 1.010–1.025)
UROBILINOGEN UA: 0.2 U/dL

## 2018-05-29 NOTE — Telephone Encounter (Signed)
appt scheduled for 11 am for pt

## 2018-05-29 NOTE — Patient Instructions (Signed)
I am sorry that you have been feeling worse.  I will ask Dr. Percival Spanish to bring you in for a cardiology visit, and will also touch base with your urologist about if you want to continue Lupron or not  I will be in touch with your labs asap Please let me know if you have any other symptoms or any change in your condition!

## 2018-05-29 NOTE — Progress Notes (Addendum)
[place] at [facility] [address], [place], [place] [address] [phone]  Date:  05/29/2018   Name:  Victor Waters   DOB:  January 10, 1926   MRN:  [id number]  PCP:  Darreld Mclean, MD    Chief Complaint: Fatigue (states after taking "prostate cancer injection" felt weak, noenergy, unable to walk much)   History of Present Illness:  Victor Waters is a 84 y.o. very pleasant male patient who presents with the following: Last seen by myself over a year ago at which time he was sent to the ER and then admitted due to dizziness.  He was thought to have had a TIA at that time.  He has not seen myself or cardiology since then. However he is being treated for prostate cancer per [facility] with lupron shots  Negative myoview in 2016 Here today with weakness according to his wife who is here with him today  They have noted him getting weak since March of this year and relate onset of weakness to when he started Lupron for prostate cancer, he had his last shots in April. It seems that he will need his next round of shots in July. They have not spoken to his urologist about his weakness or their concerns in any detail.   They feel like since he started on Lupron he has been more weak and tired.  However the timeline of his symptoms (and if there is any acute vs more gradual change) is not all that clear from the history today.  Asked several times and it seems that his symptoms are not acute but have been evolving over the last few months  He denies any pain today but did have compliant of "2 pains in his chest a little over a week ago." He noted 2 episodes of CP that occurred at rest and lasted for 2-3 seconds.  They gave him baby aspirin and this resolved so they did not seek any further care  He did fall about 2 weeks ago when he was trying to install a new lightbulb and was up on a ladder!!!  They are still living at home on their  own.  They have lost a grand-daughter and daughter to accident and illnes, and are here with their 3 yo great- granddaughter who they keep a good bit of the time. Their son lives in [place] and cannot really support them on a day to day basis per their report.     Victor Waters will sometimes drive a short distance, but his wife does most of the driving He is still able to walk their dog- to the street corner and back He is still able to vacuum the floor as well, and take out the trash They got him a rolling walker for home but have not started using it as of yet   He has not seen cardiology in some time- Dr. Percival Spanish- per his last OV in 2017: ASSESSMENT AND PLAN CAD:  He has no evidence of ischemia on his perfusion study.   I will manage this medically. HTN:  This is being managed in the context of treating his CHF CARDIOMYOPATHY:  I'm going to try to increase the Entresto 49/51 mg dose twice a day.    He will remain on the current dose of diuretic.  DYSLIPIDEMIA:   As below FATIGUE:  I had him hold his Crestor for the last month but he hasn't  noticed much improvement in his leg tiredness or fatigue. We'll hold it until I see him back if there is no improvement I will restart it. He does have mild dementia  Patient Active Problem List   Diagnosis Date Noted  . TIA (transient ischemic attack) 04/04/2017  . Facial droop 04/04/2017  . Localized swelling of lower extremity 04/04/2017  . BPH (benign prostatic hyperplasia) 04/04/2017  . Stroke-like symptoms 04/04/2017  . Syncope and collapse 08/31/2016  . Head contusion   . Tongue injury   . Chronic systolic heart failure ([place])   . Cerebral infarction, remote, resolved   . Mild dementia 08/10/2016  . Viral URI with cough 01/31/2016  . Sleep disturbance 01/06/2016  . Loose stools 01/06/2016  . Ventricular ectopy 12/31/2014  . Acute confusional state 12/31/2014  . Weakness 12/23/2014  . Palpitations   . Nocturia 09/20/2013  . Hyponatremia  09/29/2012  . Fatigue 05/09/2012  . PERSONAL HISTORY OTHER DISORDER URINARY SYSTEM 12/22/2010  . Thrombocytopenia ([place]) 08/04/2010  . LEUKOCYTOPENIA UNSPECIFIED 08/04/2010  . TINNITUS, CHRONIC 07/06/2010  . RENAL ARTERY STENOSIS 11/25/2009  . RENAL INSUFFICIENCY 11/25/2009  . HYPOTENSION 11/01/2009  . DYSPNEA 09/24/2009  . BRADYCARDIA 09/04/2009  . HYPOTENSION-ORTHOSTATIC 09/04/2009  . DIZZINESS 09/04/2009  . HYPERLIPIDEMIA 01/31/2008  . DEPRESSIVE DISORDER 01/23/2008  . Essential hypertension 01/23/2008  . ANXIETY 06/02/2007  . Coronary atherosclerosis 06/02/2007  . Insomnia 06/02/2007    Past Medical History:  Diagnosis Date  . Anxiety   . Coronary artery disease    a. s/p CABG;  b. LHC (2/11):  LM 30-40, pLAD 95-99 then 80 and 90, mCFX 90, pRCA occluded, S-dRCA ok, S-D1 ok, S-OM patent with 50, L-LAD ok.  Med Rx recommended.  c.  Myoview (5/13):  Low risk with small fixed inf defect c/w scar, no ischemia, EF 48%.  d.  Echo (6/13):  EF 55-60%, inf HK, Gr 1 DD, MAC;  e. Lexiscan Myoview (10/14):  Low risk, EF 38%, inferobasal infarct, no ischemia.  . Fatigue   . Hx of echocardiogram    Echo (10/14):  EF 55-60%, Gr 1DD, MAC, mild MR, mild LAE.  Marland Kitchen Hyperlipidemia   . Hypertension   . Hyponatremia 09/2012   Associated with postural hypotension and confusion  . Insomnia   . Myocardial infarction ([place]) 2003   hx of   . Renal artery stenosis (HCC)    bilateral;  s/p R RA stent 2011  . Seizure ([place]) 2015   unknow etiology and year of seizure???  . Syncope and collapse 08/31/2016  . Systolic heart failure (HCC)    CHRONIC  . TIA (transient ischemic attack)     Past Surgical History:  Procedure Laterality Date  . CARDIAC CATHETERIZATION  2003   revdeling severe three-vessel disease  . CORONARY ARTERY BYPASS GRAFT     LIMA to LAD, SVG to diagonal, SVG to RCA, and SVG to circumflex  . POLYPECTOMY     colon  . RENAL ARTERY STENT  01-28-10    Social History   Tobacco Use   . Smoking status: Never Smoker  . Smokeless tobacco: Never Used  Substance Use Topics  . Alcohol use: No  . Drug use: No    Family History  Problem Relation Age of Onset  . Diabetes Mother   . Stroke Mother   . Heart attack Father 65       died  . Colon cancer Other        paternal grandfather  Allergies  Allergen Reactions  . Lisinopril Cough    Renal artery stenosis by history; ACE inhibitor  would be relatively contraindicated  . Zithromax [Azithromycin]     02/15/14 N&V  . Niacin Other (See Comments)    Burning sensations in head    Medication list has been reviewed and updated.  Current Outpatient Medications on File Prior to Visit  Medication Sig Dispense Refill  . aspirin 81 MG tablet Take 81 mg by mouth at bedtime.     Marland Kitchen atorvastatin (LIPITOR) 40 MG tablet Take 1 tablet (40 mg total) by mouth daily at 6 PM. 30 tablet 0  . CALCIUM-MAGNESIUM-ZINC PO Take 1 tablet by mouth daily.    . carvedilol (COREG) 3.125 MG tablet take 1 tablet by mouth twice a day with food 180 tablet 3  . cholecalciferol (VITAMIN D) 1000 UNITS tablet Take 2,000 Units by mouth daily.    Marland Kitchen ENTRESTO 49-51 MG take 1 tablet by mouth twice a day 60 tablet 0  . ezetimibe (ZETIA) 10 MG tablet take 1/2 tablet by mouth once daily 45 tablet 3  . Melatonin 3 MG TBDP Take 1 tablet by mouth at bedtime as needed (sleep). Reported on 04/29/2016    . nitroGLYCERIN (NITROSTAT) 0.4 MG SL tablet DISSOLVE 1 TAB UNDER TONGUE AS NEEDED FOR CHEST PAIN, MAY REPEAT IN 5 MINUTES AS DIRECTED. ER IF NO BETTER. 30 tablet 0  . tamsulosin (FLOMAX) 0.4 MG CAPS capsule Take 1 capsule (0.4 mg total) by mouth daily. 90 capsule 3   No current facility-administered medications on file prior to visit.     Review of Systems:  As per HPI- otherwise negative. No fever or chills No current CP No SOB No abd pain He has lost just a few lbs over the past year or so, Victor Waters states due to her not cooking like she used to     Physical Examination: Vitals:   05/29/18 1132  BP: 118/60  Pulse: 98  Resp: 16  SpO2: 98%   Vitals:   05/29/18 1132  Weight: 139 lb (63 kg)  Height: 5' 3"  (1.6 m)   Body mass index is 24.62 kg/m. Ideal Body Weight: Weight in (lb) to have BMI = 25: 140.8  GEN: WDWN, NAD, Non-toxic, A & O x 3, neatly groomed elderly man seated in WC today.  He is able to stand up and walk a few steps independently  HEENT: Atraumatic, Normocephalic. Neck supple. No masses, No LAD. Ears and Nose: No external deformity. CV: RRR, No M/G/R. No JVD. No thrill. No extra heart sounds. PULM: CTA B, no wheezes, crackles, rhonchi. No retractions. No resp. distress. No accessory muscle use. ABD: S, NT, ND EXTR: No c/c/e NEURO slow gait but not inappropriate for a person of 84 yo PSYCH: Normally interactive. Conversant. Not depressed or anxious appearing.  Calm demeanor.   EKG: sr with 1st degree AV block, no ST changes  Compared with EKG from 2018- no changes noted   BP Readings from Last 3 Encounters:  05/29/18 118/60  07/19/17 128/84  04/05/17 139/71   Pulse Readings from Last 3 Encounters:  05/29/18 98  07/19/17 68  04/05/17 94   Wt Readings from Last 3 Encounters:  05/29/18 139 lb (63 kg)  07/19/17 141 lb (64 kg)  04/04/17 142 lb (64.4 kg)    Assessment and Plan: Weakness - Plan: CBC, Comprehensive metabolic panel, POCT urinalysis dipstick, Urine Culture, EKG 12-Lead  Prostate cancer (HCC)  Here today with generalized  weakness/ decline in energy and activity level for the last 3 months or so. Mrs. Kalan is really convinced that this is due to Lupron use. This may or may not be true; however did advise them that if they wish to DC prostate cancer treatment this may be a reasonable choice at his age. Called and LM with his urology office about their concerns, and they will wait for a follow-up visit  Also mychart message to their cardiologist requesting a follow-up visit   Advised not  to climb ladders any longer. They are staring to look at assisted living options but are not sure if they can afford this service  Urine is beign today Asked if they feel comfortable going home or would prefer to be seen in the ER today They decline ER eval today, but will seek care if any worsening or change in sx  Signed Lamar Blinks, MD  Received his labs 6/11, called to check on them- no answer  Called and discussed with his wife on 6/12- urine culture negative, no apparent reason for his sx noted on labs so far She wonders what is wrong with Victor Waters- advised that as of yet I don't have a definite answer.  It may be simply age and dementia. However if he wishes to DC prostate cancer treatment at this point I would not find this to be unreasonable.  Asked her to please bring Victor Waters back to see me in 2 months so I can check on how he is doing  Results for orders placed or performed in visit on 05/29/18  Urine Culture  Result Value Ref Range   MICRO NUMBER: [id number]    SPECIMEN QUALITY: ADEQUATE    Sample Source URINE    STATUS: FINAL    Result: No Growth   CBC  Result Value Ref Range   WBC 4.5 4.0 - 10.5 K/uL   RBC 4.52 4.22 - 5.81 Mil/uL   Platelets 148.0 (L) 150.0 - 400.0 K/uL   Hemoglobin 14.0 13.0 - 17.0 g/dL   HCT 41.3 39.0 - 52.0 %   MCV 91.4 78.0 - 100.0 fl   MCHC 33.9 30.0 - 36.0 g/dL   RDW 14.6 11.5 - 15.5 %  Comprehensive metabolic panel  Result Value Ref Range   Sodium 135 135 - 145 mEq/L   Potassium 4.8 3.5 - 5.1 mEq/L   Chloride 100 96 - 112 mEq/L   CO2 29 19 - 32 mEq/L   Glucose, Bld 100 (H) 70 - 99 mg/dL   BUN 26 (H) 6 - 23 mg/dL   Creatinine, Ser 1.04 0.40 - 1.50 mg/dL   Total Bilirubin 0.6 0.2 - 1.2 mg/dL   Alkaline Phosphatase 46 39 - 117 U/L   AST 15 0 - 37 U/L   ALT 15 0 - 53 U/L   Total Protein 5.9 (L) 6.0 - 8.3 g/dL   Albumin 3.9 3.5 - 5.2 g/dL   Calcium 9.6 8.4 - 10.5 mg/dL   GFR 71.27 >60.00 mL/min  POCT urinalysis dipstick  Result Value  Ref Range   Color, UA yellow yellow   Clarity, UA clear clear   Glucose, UA negative negative mg/dL   Bilirubin, UA negative negative   Ketones, POC UA negative negative mg/dL   Spec Grav, UA 1.025 1.010 - 1.025   Blood, UA negative negative   pH, UA 6.0 5.0 - 8.0   Protein Ur, POC negative negative mg/dL   Urobilinogen, UA 0.2 0.2 or 1.0 E.U./dL  Nitrite, UA Negative Negative   Leukocytes, UA Negative Negative

## 2018-05-29 NOTE — Telephone Encounter (Signed)
-----   Message from Minus Breeding, MD sent at 05/29/2018 12:23 PM EDT ----- [facility] can offer him a spot on Thursday morning.   ----- Message ----- From: Darreld Mclean, MD Sent: 05/29/2018  12:14 PM To: Minus Breeding, MD  Clearnce Sorrel, Could you ask your staff to schedule Mr. Kreh for an office visit with you?  He had complaint of some atypical chest pain at our visit today.  Also as an FYI it seems like he is starting to decline overall, was here today with complaint of generalized weakness Thank you!  Jess

## 2018-05-30 LAB — URINE CULTURE
MICRO NUMBER:: 90693577
Result:: NO GROWTH
SPECIMEN QUALITY:: ADEQUATE

## 2018-05-31 ENCOUNTER — Telehealth: Payer: Self-pay | Admitting: Family Medicine

## 2018-05-31 NOTE — Telephone Encounter (Signed)
Copied from [place] [id number]. Topic: Quick Communication - Lab Results >> May 31, 2018  1:55 PM Cecelia Byars, NT wrote: Reason for CRM: Patients wife called and would like his lab results please call her at [phone]

## 2018-05-31 NOTE — Progress Notes (Signed)
Cardiology Office Note   Date:  06/01/2018   ID:  Victor Waters, DOB [birth year], MRN [id number]  PCP:  Darreld Mclean, MD  Cardiologist:   No primary care provider on file.   Chief Complaint  Patient presents with  . Fatigue      History of Present Illness: Victor Waters is a 84 y.o. male who presents for follow up of CAD/CABG:  He also has a cardiomyopathy with an EF of 25 - 30% at one point but improved to normal on the most recent echo in 2018.  He had a perfusion study with no ischemia in 2016.  I last saw him in 2017.  He was last in the hospital in 2018 with TIAs without clear source.    Since I last saw them they have lost her daughter and their granddaughter.  They help to take care of 3 great-grandchildren.  He has been diagnosed and treated with prostate cancer but is not doing well with the therapy and is going to consider stopping this.  He denies any cardiovascular symptoms.  He just feels fatigued and weak.  His gait has been off and he has had falls when he is out in his yard.  He just bought a walker but he has not started using it yet.  Is not having any presyncope or syncope.  He is not having any palpitations.  He has had a couple of fleeting episodes of chest pain and has had to take nitroglycerin rarely.  Past Medical History:  Diagnosis Date  . Anxiety   . Coronary artery disease    a. s/p CABG;  b. LHC (2/11):  LM 30-40, pLAD 95-99 then 80 and 90, mCFX 90, pRCA occluded, S-dRCA ok, S-D1 ok, S-OM patent with 50, L-LAD ok.  Med Rx recommended.  c.  Myoview (5/13):  Low risk with small fixed inf defect c/w scar, no ischemia, EF 48%.  d.  Echo (6/13):  EF 55-60%, inf HK, Gr 1 DD, MAC;  e. Lexiscan Myoview (10/14):  Low risk, EF 38%, inferobasal infarct, no ischemia.  . Fatigue   . Hx of echocardiogram    Echo (10/14):  EF 55-60%, Gr 1DD, MAC, mild MR, mild LAE.  Marland Kitchen Hyperlipidemia   . Hypertension   . Hyponatremia 09/2012   Associated with postural  hypotension and confusion  . Insomnia   . Myocardial infarction ([place]) 2003   hx of   . Renal artery stenosis (HCC)    bilateral;  s/p R RA stent 2011  . Seizure ([place]) 2015   unknow etiology and year of seizure???  . Syncope and collapse 08/31/2016  . Systolic heart failure (H[facility])    CHRONIC  . TIA (transient ischemic attack)     Past Surgical History:  Procedure Laterality Date  . CARDIAC CATHETERIZATION  2003   revdeling severe three-vessel disease  . CORONARY ARTERY BYPASS GRAFT     LIMA to LAD, SVG to diagonal, SVG to RCA, and SVG to circumflex  . POLYPECTOMY     colon  . RENAL ARTERY STENT  01-28-10     Current Outpatient Medications  Medication Sig Dispense Refill  . aspirin 81 MG tablet Take 81 mg by mouth at bedtime.     Marland Kitchen atorvastatin (LIPITOR) 40 MG tablet Take 1 tablet (40 mg total) by mouth daily at 6 PM. 30 tablet 0  . CALCIUM-MAGNESIUM-ZINC PO Take 1 tablet by mouth daily.    . cholecalciferol (VITAMIN D)  1000 UNITS tablet Take 2,000 Units by mouth daily.    Marland Kitchen ENTRESTO 49-51 MG take 1 tablet by mouth twice a day 60 tablet 0  . ezetimibe (ZETIA) 10 MG tablet take 1/2 tablet by mouth once daily 45 tablet 3  . Melatonin 3 MG TBDP Take 1 tablet by mouth at bedtime as needed (sleep). Reported on 04/29/2016    . nitroGLYCERIN (NITROSTAT) 0.4 MG SL tablet DISSOLVE 1 TAB UNDER TONGUE AS NEEDED FOR CHEST PAIN, MAY REPEAT IN 5 MINUTES AS DIRECTED. ER IF NO BETTER. 25 tablet 2  . tamsulosin (FLOMAX) 0.4 MG CAPS capsule Take 1 capsule (0.4 mg total) by mouth daily. 90 capsule 3   No current facility-administered medications for this visit.     Allergies:   Lisinopril; Zithromax [azithromycin]; and Niacin    ROS:  Please see the history of present illness.   Otherwise, review of systems are positive for none.   All other systems are reviewed and negative.    PHYSICAL EXAM: VS:  BP (!) 150/78 (BP Location: Left Arm, Patient Position: Sitting, Cuff Size: Normal)   Pulse 85    Ht _0  (1.6 m)   Wt 141 lb (64 kg)   BMI 24.98 kg/m  , BMI Body mass index is 24.98 kg/m. GENERAL:  Well appearing but slightly frail HEENT:  Pupils equal round and reactive, fundi not visualized, oral mucosa unremarkable NECK:  No jugular venous distention, waveform within normal limits, carotid upstroke brisk and symmetric, no bruits, no thyromegaly LYMPHATICS:  No cervical, inguinal adenopathy LUNGS:  Clear to auscultation bilaterally BACK:  No CVA tenderness CHEST:  Unremarkable HEART:  PMI not displaced or sustained,S1 and S2 within normal limits, no S3, no S4, no clicks, no rubs, no murmurs ABD:  Flat, positive bowel sounds normal in frequency in pitch, no bruits, no rebound, no guarding, no midline pulsatile mass, no hepatomegaly, no splenomegaly EXT:  2 plus pulses throughout, no edema, no cyanosis no clubbing SKIN:  No rashes no nodules NEURO:  Cranial nerves II through XII grossly intact, motor grossly intact throughout PSYCH:  Cognitively intact, oriented to person place and time    EKG:  EKG is ordered today. The ekg ordered today demonstrates sinus rhythm, rate 85, axis within normal limits, intervals within normal limits, no acute ST-T wave changes.   Recent Labs: 05/29/2018: ALT 15; BUN 26; Creatinine, Ser 1.04; Hemoglobin 14.0; Platelets 148.0; Potassium 4.8; Sodium 135    Lipid Panel    Component Value Date/Time   CHOL 224 (H) 04/05/2017 0543   TRIG 88 04/05/2017 0543   HDL 38 (L) 04/05/2017 0543   CHOLHDL 5.9 04/05/2017 0543   VLDL 18 04/05/2017 0543   LDLCALC 168 (H) 04/05/2017 0543      Wt Readings from Last 3 Encounters:  06/01/18 141 lb (64 kg)  05/29/18 139 lb (63 kg)  07/19/17 141 lb (64 kg)      Other studies Reviewed: Additional studies/ records that were reviewed today include: Hospital records. Review of the above records demonstrates:  Please see elsewhere in the note.     ASSESSMENT AND PLAN:  HTN:   His BP is running on the  lower side most of the time.  I will be making med adjustments as below  DYSLIPIDEMIA:  I will have him come back for a fasting lipid profile.     NSVT:  This was noted at the time of his last hospitalizatoin.  However, he has no symptoms and  will have no further testing.     CAD:  Since his last stress test he has had no new symptoms.  No change in therapy.     CARDIOMYOPATHY:  His EF is normal.  Given his weakness and fatigue I am just going to get rid of the beta-blocker to see if this helps at all.  We are working towards quality of life in the situation.  Current medicines are reviewed at length with the patient today.  The patient does not have concerns regarding medicines.  The following changes have been made:  As above  Labs/ tests ordered today include:   Orders Placed This Encounter  Procedures  . Lipid panel  . EKG 12-Lead     Disposition:   FU with me in one year    Signed, Minus Breeding, MD  06/01/2018 11:41 AM    [place]

## 2018-05-31 NOTE — Telephone Encounter (Signed)
Waiting on results from pcp.

## 2018-06-01 ENCOUNTER — Ambulatory Visit: Payer: Medicare HMO | Admitting: Cardiology

## 2018-06-01 ENCOUNTER — Encounter: Payer: Self-pay | Admitting: Cardiology

## 2018-06-01 VITALS — BP 150/78 | HR 85 | Ht 63.0 in | Wt 141.0 lb

## 2018-06-01 DIAGNOSIS — I10 Essential (primary) hypertension: Secondary | ICD-10-CM | POA: Diagnosis not present

## 2018-06-01 DIAGNOSIS — E782 Mixed hyperlipidemia: Secondary | ICD-10-CM | POA: Diagnosis not present

## 2018-06-01 DIAGNOSIS — R5383 Other fatigue: Secondary | ICD-10-CM

## 2018-06-01 DIAGNOSIS — I2510 Atherosclerotic heart disease of native coronary artery without angina pectoris: Secondary | ICD-10-CM

## 2018-06-01 MED ORDER — NITROGLYCERIN 0.4 MG SL SUBL: SUBLINGUAL_TABLET | SUBLINGUAL | 2 refills | Status: DC

## 2018-06-01 NOTE — Patient Instructions (Signed)
Medication Instructions:  STOP- Carvedilol  If you need a refill on your cardiac medications before your next appointment, please call your pharmacy.  Labwork: Fasting Lipids HERE IN OUR OFFICE AT [facility]  Take the provided lab slips with you to the lab for your blood draw.   You will need to fast. DO NOT EAT OR DRINK PAST MIDNIGHT.   Testing/Procedures: None Ordered  Follow-Up: Your physician wants you to follow-up in: 1 Year. You should receive a reminder letter in the mail two months in advance. If you do not receive a letter, please call our office [phone].      Thank you for choosing [facility]!!

## 2018-06-01 NOTE — Telephone Encounter (Signed)
PCP spoke with patient's wife. Notified and verbalized understanding.

## 2018-07-31 DIAGNOSIS — C61 Malignant neoplasm of prostate: Secondary | ICD-10-CM | POA: Diagnosis not present

## 2018-08-25 ENCOUNTER — Ambulatory Visit (INDEPENDENT_AMBULATORY_CARE_PROVIDER_SITE_OTHER): Payer: Medicare HMO | Admitting: Internal Medicine

## 2018-08-25 ENCOUNTER — Encounter: Payer: Self-pay | Admitting: Internal Medicine

## 2018-08-25 VITALS — BP 140/68 | HR 85 | Temp 97.8°F | Ht 65.0 in | Wt 145.1 lb

## 2018-08-25 DIAGNOSIS — R5383 Other fatigue: Secondary | ICD-10-CM | POA: Diagnosis not present

## 2018-08-25 DIAGNOSIS — Z636 Dependent relative needing care at home: Secondary | ICD-10-CM | POA: Diagnosis not present

## 2018-08-25 NOTE — Progress Notes (Signed)
Pre visit review using our clinic review tool, if applicable. No additional management support is needed unless otherwise documented below in the visit note. 

## 2018-08-25 NOTE — Progress Notes (Signed)
Subjective:    Patient ID: Victor Waters, male    DOB: [birth year], 84 y.o.   MRN: [id number]  DOS:  08/25/2018 Type of visit - description : acute, here w/ his wife Interval history: His main concern today is weakness, described as simply lack of energy. Reports that he is feeling the way since he started "the shots" referring to  Lupron for advanced prostate cancer. He has a lot of stress, the patient and his wife take care of 3 g-g-children.   Review of Systems I asked about his memory, wife reports that the short-term memory is very poor.  Physical exam is okay though.  See below Denies fever chills.  Appetite is very good. No chest pain or difficulty breathing. Mild lower extremity edema at baseline Occasionally feels sad, stressed or depressed but usually feels well emotionally.  Past Medical History:  Diagnosis Date  . Anxiety   . Coronary artery disease    a. s/p CABG;  b. LHC (2/11):  LM 30-40, pLAD 95-99 then 80 and 90, mCFX 90, pRCA occluded, S-dRCA ok, S-D1 ok, S-OM patent with 50, L-LAD ok.  Med Rx recommended.  c.  Myoview (5/13):  Low risk with small fixed inf defect c/w scar, no ischemia, EF 48%.  d.  Echo (6/13):  EF 55-60%, inf HK, Gr 1 DD, MAC;  e. Lexiscan Myoview (10/14):  Low risk, EF 38%, inferobasal infarct, no ischemia.  . Fatigue   . Hx of echocardiogram    Echo (10/14):  EF 55-60%, Gr 1DD, MAC, mild MR, mild LAE.  Marland Kitchen Hyperlipidemia   . Hypertension   . Hyponatremia 09/2012   Associated with postural hypotension and confusion  . Insomnia   . Myocardial infarction ([place]) 2003   hx of   . Renal artery stenosis (HCC)    bilateral;  s/p R RA stent 2011  . Seizure ([place]) 2015   unknow etiology and year of seizure???  . Syncope and collapse 08/31/2016  . Systolic heart failure (HCC)    CHRONIC  . TIA (transient ischemic attack)     Past Surgical History:  Procedure Laterality Date  . CARDIAC CATHETERIZATION  2003   revdeling severe three-vessel disease   . CORONARY ARTERY BYPASS GRAFT     LIMA to LAD, SVG to diagonal, SVG to RCA, and SVG to circumflex  . POLYPECTOMY     colon  . RENAL ARTERY STENT  01-28-10    Social History   Socioeconomic History  . Marital status: Married    Spouse name: Not on file  . Number of children: Not on file  . Years of education: Not on file  . Highest education level: Not on file  Occupational History  . Not on file  Social Needs  . Financial resource strain: Not on file  . Food insecurity:    Worry: Not on file    Inability: Not on file  . Transportation needs:    Medical: Not on file    Non-medical: Not on file  Tobacco Use  . Smoking status: Never Smoker  . Smokeless tobacco: Never Used  Substance and Sexual Activity  . Alcohol use: No  . Drug use: No  . Sexual activity: Not Currently  Lifestyle  . Physical activity:    Days per week: Not on file    Minutes per session: Not on file  . Stress: Not on file  Relationships  . Social connections:    Talks on phone: Not on file  Gets together: Not on file    Attends religious service: Not on file    Active member of club or organization: Not on file    Attends meetings of clubs or organizations: Not on file    Relationship status: Not on file  . Intimate partner violence:    Fear of current or ex partner: Not on file    Emotionally abused: Not on file    Physically abused: Not on file    Forced sexual activity: Not on file  Other Topics Concern  . Not on file  Social History Narrative   Married   Lives with wife and 32year old ggd      Allergies as of 08/25/2018      Reactions   Lisinopril Cough   Renal artery stenosis by history; ACE inhibitor  would be relatively contraindicated   Zithromax [azithromycin]    02/15/14 N&V   Niacin Other (See Comments)   Burning sensations in head      Medication List        Accurate as of 08/25/18 11:59 PM. Always use your most recent med list.          aspirin 81 MG tablet Take 81  mg by mouth at bedtime.   atorvastatin 40 MG tablet Commonly known as:  LIPITOR Take 1 tablet (40 mg total) by mouth daily at 6 PM.   CALCIUM-MAGNESIUM-ZINC PO Take 1 tablet by mouth daily.   cholecalciferol 1000 units tablet Commonly known as:  VITAMIN D Take 2,000 Units by mouth daily.   ENTRESTO 49-51 MG Generic drug:  sacubitril-valsartan take 1 tablet by mouth twice a day   ezetimibe 10 MG tablet Commonly known as:  ZETIA take 1/2 tablet by mouth once daily   Melatonin 3 MG Tbdp Take 1 tablet by mouth at bedtime as needed (sleep). Reported on 04/29/2016   nitroGLYCERIN 0.4 MG SL tablet Commonly known as:  NITROSTAT DISSOLVE 1 TAB UNDER TONGUE AS NEEDED FOR CHEST PAIN, MAY REPEAT IN 5 MINUTES AS DIRECTED. ER IF NO BETTER.   tamsulosin 0.4 MG Caps capsule Commonly known as:  FLOMAX Take 1 capsule (0.4 mg total) by mouth daily.          Objective:   Physical Exam BP 140/68 (BP Location: Left Arm, Patient Position: Sitting, Cuff Size: Normal)   Pulse 85   Temp 97.8 F (36.6 C) (Oral)   Ht _0  (1.651 m)   Wt 145 lb 2 oz (65.8 kg)   SpO2 96%   BMI 24.15 kg/m  General:   Well developed, NAD, see BMI.  HEENT:  Normocephalic . Face symmetric, atraumatic Lungs:  CTA B Normal respiratory effort, no intercostal retractions, no accessory muscle use. Heart: RRR,  no murmur.  No pretibial edema bilaterally  Skin: Not pale. Not jaundice Neurologic:  alert & oriented to self, space, time.    Speech normal, gait appropriate for age and unassisted Psych--  Cognition and judgment appear intact.  Cooperative with normal attention span and concentration.  Behavior appropriate. No anxious or depressed appearing.       Assessment & Plan:     84 year old male, PMH includes CAD, CABG, cardiomyopathy, HTN, dyslipidemia, prostate cancer, presents with   Severe fatigue: As described above, symptoms of started after Lupron injection, probably related. He has CAD but  denies chest pain or difficulty breathing. Labs reviewed, they look okay, for completeness we will check a TSH, vitamin D, P10, B1 and folic acid. If normal,  will recommend to be reassessed by PCP in 2 months. Stress: counseled extensively, cares for 3 g-g-children

## 2018-08-25 NOTE — Patient Instructions (Signed)
GO TO THE LAB : Get the blood work     GO TO THE FRONT DESK Schedule your next appointment for a  Check up with Dr Lorelei Pont in 2 months

## 2018-08-30 LAB — TSH: TSH: 1.82 mIU/L (ref 0.40–4.50)

## 2018-08-30 LAB — VITAMIN D 1,25 DIHYDROXY
Vitamin D 1, 25 (OH)2 Total: 31 pg/mL (ref 18–72)
Vitamin D2 1, 25 (OH)2: <8 pg/mL
Vitamin D3 1, 25 (OH)2: 31 pg/mL

## 2018-08-30 LAB — VITAMIN B1: Vitamin B1 (Thiamine): 14 nmol/L (ref 8–30)

## 2018-08-30 LAB — B12 AND FOLATE PANEL
Folate: 10.2 ng/mL
Vitamin B-12: 1021 pg/mL (ref 200–1100)

## 2018-09-10 NOTE — Progress Notes (Signed)
[facility] [place], [place], [place] [address] [phone]  Date:  09/14/2018   Name:  Victor Waters   DOB:  1926-01-04   MRN:  [id number]  PCP:  Darreld Mclean, MD    Chief Complaint: Fatigue (weakness, felt this way since prostate cancer injections) and Rash (rash on penis and rectum, redness, itching, started yesterday)   History of Present Illness:  Victor Waters is a 83 y.o. very pleasant male patient who presents with the following: History of TIA last year, prostate cancer, HTN, dyslipidemia, cardiomyopathy/ CHF with improvement of EF to 55% on his most recentecho in 2018 I last saw this pt in June: Last seen by myself over a year ago at which time he was sent to the ER and then admitted due to dizziness.  He was thought to have had a TIA at that time.  He has not seen myself or cardiology since then. However he is being treated for prostate cancer per [facility] with lupron shots  They have noted him getting weak since March of this year and relate onset of weakness to when he started Lupron for prostate cancer, he had his last shots in April. It seems that he will need his next round of shots in July. They have not spoken to his urologist about his weakness or their concerns in any detail.   They feel like since he started on Lupron he has been more weak and tired.  However the timeline of his symptoms (and if there is any acute vs more gradual change) is not all that clear from the history today.  Asked several times and it seems that his symptoms are not acute but have been evolving over the last few months He denies any pain today but did have compliant of "2 pains in his chest a little over a week ago." He noted 2 episodes of CP that occurred at rest and lasted for 2-3 seconds.  They gave him baby aspirin and this resolved so they did not seek any further care He did fall about 2 weeks ago when he  was trying to install a new lightbulb and was up on a ladder!!! They are still living at home on their own.  They have lost a grand-daughter and daughter to accident and illnes, and are here with their 39 yo great- granddaughter who they keep a good bit of the time. Their son lives in [place] and cannot really support them on a day to day basis per their report.    Victor Waters will sometimes drive a short distance, but his wife does most of the driving He is still able to walk their dog- to the street corner and back He is still able to vacuum the floor as well, and take out the trash They got him a rolling walker for home but have not started using it as of yet   He also saw his cardiologist Dr. Percival Spanish in June, at which time they stopped his BB:  HTN:   His BP is running on the lower side most of the time.  I will be making med adjustments as below DYSLIPIDEMIA:  I will have him come back for a fasting lipid profile.    NSVT:  This was noted at the time of his last hospitalizatoin.  However, he has no symptoms and will have no further testing.    CAD:  Since  his last stress test he has had no new symptoms.  No change in therapy.    CARDIOMYOPATHY:  His EF is normal.  Given his weakness and fatigue I am just going to get rid of the beta-blocker to see if this helps at all.  We are working towards quality of life in the situation.  Pneumonia vaccine- pt has had series they think,will try and get records from drugstore  Flu shot: do today   He notes that his penis is red and he has a groin rash- started one day ago He is still using Lupron shots for his prostate cancer.  He and his urologist have decided to continue treatment- he does feel like it makes him tired and weak but he wishes to continue in the interest of extending his life His wife does most of the driving nowadays - [place] feels that he can still drive but prefers not to It takes him a while to void, but this is not new or worse since he  developed this rash  Patient Active Problem List   Diagnosis Date Noted  . TIA (transient ischemic attack) 04/04/2017  . Facial droop 04/04/2017  . Localized swelling of lower extremity 04/04/2017  . BPH (benign prostatic hyperplasia) 04/04/2017  . Stroke-like symptoms 04/04/2017  . Syncope and collapse 08/31/2016  . Chronic systolic heart failure ([place])   . Cerebral infarction, remote, resolved   . Mild dementia 08/10/2016  . Sleep disturbance 01/06/2016  . Ventricular ectopy 12/31/2014  . Acute confusional state 12/31/2014  . Palpitations   . Nocturia 09/20/2013  . Hyponatremia 09/29/2012  . Fatigue 05/09/2012  . PERSONAL HISTORY OTHER DISORDER URINARY SYSTEM 12/22/2010  . Thrombocytopenia ([place]) 08/04/2010  . LEUKOCYTOPENIA UNSPECIFIED 08/04/2010  . TINNITUS, CHRONIC 07/06/2010  . RENAL ARTERY STENOSIS 11/25/2009  . RENAL INSUFFICIENCY 11/25/2009  . DYSPNEA 09/24/2009  . BRADYCARDIA 09/04/2009  . HYPOTENSION-ORTHOSTATIC 09/04/2009  . DIZZINESS 09/04/2009  . HYPERLIPIDEMIA 01/31/2008  . DEPRESSIVE DISORDER 01/23/2008  . Essential hypertension 01/23/2008  . ANXIETY 06/02/2007  . Coronary atherosclerosis 06/02/2007  . Insomnia 06/02/2007    Past Medical History:  Diagnosis Date  . Anxiety   . Coronary artery disease    a. s/p CABG;  b. LHC (2/11):  LM 30-40, pLAD 95-99 then 80 and 90, mCFX 90, pRCA occluded, S-dRCA ok, S-D1 ok, S-OM patent with 50, L-LAD ok.  Med Rx recommended.  c.  Myoview (5/13):  Low risk with small fixed inf defect c/w scar, no ischemia, EF 48%.  d.  Echo (6/13):  EF 55-60%, inf HK, Gr 1 DD, MAC;  e. Lexiscan Myoview (10/14):  Low risk, EF 38%, inferobasal infarct, no ischemia.  . Fatigue   . Hx of echocardiogram    Echo (10/14):  EF 55-60%, Gr 1DD, MAC, mild MR, mild LAE.  Marland Kitchen Hyperlipidemia   . Hypertension   . Hyponatremia 09/2012   Associated with postural hypotension and confusion  . Insomnia   . Myocardial infarction ([place]) 2003   hx of   .  Renal artery stenosis (HCC)    bilateral;  s/p R RA stent 2011  . Seizure ([place]) 2015   unknow etiology and year of seizure???  . Syncope and collapse 08/31/2016  . Systolic heart failure (HCC)    CHRONIC  . TIA (transient ischemic attack)     Past Surgical History:  Procedure Laterality Date  . CARDIAC CATHETERIZATION  2003   revdeling severe three-vessel disease  . CORONARY ARTERY BYPASS GRAFT  LIMA to LAD, SVG to diagonal, SVG to RCA, and SVG to circumflex  . POLYPECTOMY     colon  . RENAL ARTERY STENT  01-28-10    Social History   Tobacco Use  . Smoking status: Never Smoker  . Smokeless tobacco: Never Used  Substance Use Topics  . Alcohol use: No  . Drug use: No    Family History  Problem Relation Age of Onset  . Diabetes Mother   . Stroke Mother   . Heart attack Father 37       died  . Colon cancer Other        paternal grandfather    Allergies  Allergen Reactions  . Lisinopril Cough    Renal artery stenosis by history; ACE inhibitor  would be relatively contraindicated  . Zithromax [Azithromycin]     02/15/14 N&V  . Niacin Other (See Comments)    Burning sensations in head    Medication list has been reviewed and updated.  Current Outpatient Medications on File Prior to Visit  Medication Sig Dispense Refill  . aspirin 81 MG tablet Take 81 mg by mouth at bedtime.     Marland Kitchen atorvastatin (LIPITOR) 40 MG tablet Take 1 tablet (40 mg total) by mouth daily at 6 PM. 30 tablet 0  . CALCIUM-MAGNESIUM-ZINC PO Take 1 tablet by mouth daily.    . cholecalciferol (VITAMIN D) 1000 UNITS tablet Take 2,000 Units by mouth daily.    Marland Kitchen ENTRESTO 49-51 MG take 1 tablet by mouth twice a day 60 tablet 0  . ezetimibe (ZETIA) 10 MG tablet take 1/2 tablet by mouth once daily 45 tablet 3  . Melatonin 3 MG TBDP Take 1 tablet by mouth at bedtime as needed (sleep). Reported on 04/29/2016    . nitroGLYCERIN (NITROSTAT) 0.4 MG SL tablet DISSOLVE 1 TAB UNDER TONGUE AS NEEDED FOR CHEST  PAIN, MAY REPEAT IN 5 MINUTES AS DIRECTED. ER IF NO BETTER. 25 tablet 2  . tamsulosin (FLOMAX) 0.4 MG CAPS capsule Take 1 capsule (0.4 mg total) by mouth daily. 90 capsule 3   No current facility-administered medications on file prior to visit.     Review of Systems:  As per HPI- otherwise negative. No fever or chills No CP or SOB No dysuria    Physical Examination: Vitals:   09/14/18 1058  BP: 130/70  Pulse: 82  Resp: 16  Temp: 97.7 F (36.5 C)  SpO2: 95%   Vitals:   09/14/18 1058  Weight: 145 lb (65.8 kg)  Height: _0  (1.651 m)   Body mass index is 24.13 kg/m. Ideal Body Weight: Weight in (lb) to have BMI = 25: 149.9  GEN: WDWN, NAD, Non-toxic, A & O x 3, well appearing elderly man HEENT: Atraumatic, Normocephalic. Neck supple. No masses, No LAD. Ears and Nose: No external deformity. CV: RRR, No M/G/R. No JVD. No thrill. No extra heart sounds. PULM: CTA B, no wheezes, crackles, rhonchi. No retractions. No resp. distress. No accessory muscle use. ABD: S, NT, ND EXTR: No c/c/e NEURO Normal gait.  PSYCH: Normally interactive. Conversant. Not depressed or anxious appearing.  Calm demeanor.  GU: uncircumcised male with mild balanitis and tinea cruris on exam.  The foreskin is easily mobile and just minimally edematous   Assessment and Plan: Balanitis - Plan: clotrimazole (LOTRIMIN) 1 % cream  Immunization due - Plan: Flu vaccine HIGH DOSE PF (Fluzone High dose)  Likely mild yeast infection Treat with topical clotrimazole.  However if not helpful  in the next few days they will contact me and we can try an oral med Flu shot given Fatigue, chronic.  They feel this is due to lupron shots but do not wish to stop treatment at this time   Signed Lamar Blinks, MD

## 2018-09-14 ENCOUNTER — Encounter: Payer: Self-pay | Admitting: Family Medicine

## 2018-09-14 ENCOUNTER — Ambulatory Visit (INDEPENDENT_AMBULATORY_CARE_PROVIDER_SITE_OTHER): Payer: Medicare HMO | Admitting: Family Medicine

## 2018-09-14 VITALS — BP 130/70 | HR 82 | Temp 97.7°F | Resp 16 | Ht 65.0 in | Wt 145.0 lb

## 2018-09-14 DIAGNOSIS — Z23 Encounter for immunization: Secondary | ICD-10-CM

## 2018-09-14 DIAGNOSIS — N481 Balanitis: Secondary | ICD-10-CM | POA: Diagnosis not present

## 2018-09-14 MED ORDER — CLOTRIMAZOLE 1 % EX CREA: 1.0000 "application " | TOPICAL_CREAM | Freq: Two times a day (BID) | CUTANEOUS | 0 refills | Status: DC

## 2018-09-14 NOTE — Patient Instructions (Addendum)
If you are able to get your old shot records from [facility] that would be very helpful  Flu shot given today For your rash, pleas use the antifungal cream that I gave you twice a day  However if not better in the next few days call me and I will send in an oral medication

## 2018-09-26 ENCOUNTER — Other Ambulatory Visit: Payer: Self-pay | Admitting: Family Medicine

## 2018-09-26 ENCOUNTER — Other Ambulatory Visit: Payer: Self-pay | Admitting: Cardiology

## 2018-09-26 DIAGNOSIS — R3912 Poor urinary stream: Secondary | ICD-10-CM

## 2018-09-26 DIAGNOSIS — N400 Enlarged prostate without lower urinary tract symptoms: Secondary | ICD-10-CM

## 2018-11-24 DIAGNOSIS — C61 Malignant neoplasm of prostate: Secondary | ICD-10-CM | POA: Diagnosis not present

## 2018-12-04 DIAGNOSIS — C61 Malignant neoplasm of prostate: Secondary | ICD-10-CM | POA: Diagnosis not present

## 2019-01-20 ENCOUNTER — Inpatient Hospital Stay (HOSPITAL_COMMUNITY)
Admission: EM | Admit: 2019-01-20 | Discharge: 2019-01-23 | DRG: 312 | Disposition: A | Discharge status: Home Health | Payer: Medicare HMO | Attending: Internal Medicine | Admitting: Internal Medicine

## 2019-01-20 ENCOUNTER — Emergency Department (HOSPITAL_COMMUNITY): Payer: Medicare HMO

## 2019-01-20 ENCOUNTER — Observation Stay (HOSPITAL_BASED_OUTPATIENT_CLINIC_OR_DEPARTMENT_OTHER): Payer: Medicare HMO

## 2019-01-20 ENCOUNTER — Encounter (HOSPITAL_COMMUNITY): Payer: Self-pay

## 2019-01-20 ENCOUNTER — Other Ambulatory Visit: Payer: Self-pay

## 2019-01-20 DIAGNOSIS — Z79890 Hormone replacement therapy (postmenopausal): Secondary | ICD-10-CM

## 2019-01-20 DIAGNOSIS — R072 Precordial pain: Secondary | ICD-10-CM

## 2019-01-20 DIAGNOSIS — W1830XA Fall on same level, unspecified, initial encounter: Secondary | ICD-10-CM | POA: Diagnosis present

## 2019-01-20 DIAGNOSIS — I5022 Chronic systolic (congestive) heart failure: Secondary | ICD-10-CM | POA: Diagnosis not present

## 2019-01-20 DIAGNOSIS — Z8249 Family history of ischemic heart disease and other diseases of the circulatory system: Secondary | ICD-10-CM

## 2019-01-20 DIAGNOSIS — S0990XA Unspecified injury of head, initial encounter: Secondary | ICD-10-CM | POA: Diagnosis not present

## 2019-01-20 DIAGNOSIS — W19XXXA Unspecified fall, initial encounter: Secondary | ICD-10-CM | POA: Diagnosis not present

## 2019-01-20 DIAGNOSIS — I10 Essential (primary) hypertension: Secondary | ICD-10-CM | POA: Diagnosis present

## 2019-01-20 DIAGNOSIS — I951 Orthostatic hypotension: Principal | ICD-10-CM | POA: Diagnosis present

## 2019-01-20 DIAGNOSIS — R42 Dizziness and giddiness: Secondary | ICD-10-CM | POA: Diagnosis not present

## 2019-01-20 DIAGNOSIS — I701 Atherosclerosis of renal artery: Secondary | ICD-10-CM | POA: Diagnosis present

## 2019-01-20 DIAGNOSIS — D696 Thrombocytopenia, unspecified: Secondary | ICD-10-CM | POA: Diagnosis present

## 2019-01-20 DIAGNOSIS — G4700 Insomnia, unspecified: Secondary | ICD-10-CM | POA: Diagnosis present

## 2019-01-20 DIAGNOSIS — R55 Syncope and collapse: Secondary | ICD-10-CM | POA: Diagnosis not present

## 2019-01-20 DIAGNOSIS — F419 Anxiety disorder, unspecified: Secondary | ICD-10-CM | POA: Diagnosis not present

## 2019-01-20 DIAGNOSIS — R296 Repeated falls: Secondary | ICD-10-CM | POA: Diagnosis present

## 2019-01-20 DIAGNOSIS — S299XXA Unspecified injury of thorax, initial encounter: Secondary | ICD-10-CM | POA: Diagnosis not present

## 2019-01-20 DIAGNOSIS — Z66 Do not resuscitate: Secondary | ICD-10-CM | POA: Diagnosis not present

## 2019-01-20 DIAGNOSIS — Z79899 Other long term (current) drug therapy: Secondary | ICD-10-CM | POA: Diagnosis not present

## 2019-01-20 DIAGNOSIS — R739 Hyperglycemia, unspecified: Secondary | ICD-10-CM | POA: Diagnosis not present

## 2019-01-20 DIAGNOSIS — G44309 Post-traumatic headache, unspecified, not intractable: Secondary | ICD-10-CM | POA: Diagnosis not present

## 2019-01-20 DIAGNOSIS — I4430 Unspecified atrioventricular block: Secondary | ICD-10-CM | POA: Diagnosis not present

## 2019-01-20 DIAGNOSIS — Z951 Presence of aortocoronary bypass graft: Secondary | ICD-10-CM | POA: Diagnosis not present

## 2019-01-20 DIAGNOSIS — Z7982 Long term (current) use of aspirin: Secondary | ICD-10-CM

## 2019-01-20 DIAGNOSIS — F0390 Unspecified dementia without behavioral disturbance: Secondary | ICD-10-CM | POA: Diagnosis present

## 2019-01-20 DIAGNOSIS — R0789 Other chest pain: Secondary | ICD-10-CM | POA: Diagnosis present

## 2019-01-20 DIAGNOSIS — I110 Hypertensive heart disease with heart failure: Secondary | ICD-10-CM | POA: Diagnosis present

## 2019-01-20 DIAGNOSIS — C61 Malignant neoplasm of prostate: Secondary | ICD-10-CM | POA: Diagnosis not present

## 2019-01-20 DIAGNOSIS — R531 Weakness: Secondary | ICD-10-CM | POA: Diagnosis not present

## 2019-01-20 DIAGNOSIS — E785 Hyperlipidemia, unspecified: Secondary | ICD-10-CM | POA: Diagnosis present

## 2019-01-20 DIAGNOSIS — I252 Old myocardial infarction: Secondary | ICD-10-CM

## 2019-01-20 DIAGNOSIS — I2510 Atherosclerotic heart disease of native coronary artery without angina pectoris: Secondary | ICD-10-CM | POA: Diagnosis present

## 2019-01-20 DIAGNOSIS — Z8673 Personal history of transient ischemic attack (TIA), and cerebral infarction without residual deficits: Secondary | ICD-10-CM

## 2019-01-20 DIAGNOSIS — R079 Chest pain, unspecified: Secondary | ICD-10-CM | POA: Diagnosis not present

## 2019-01-20 DIAGNOSIS — N400 Enlarged prostate without lower urinary tract symptoms: Secondary | ICD-10-CM | POA: Diagnosis present

## 2019-01-20 LAB — CBC
HCT: 37.9 % — ABNORMAL LOW (ref 39.0–52.0)
Hemoglobin: 12.5 g/dL — ABNORMAL LOW (ref 13.0–17.0)
MCH: 30.6 pg (ref 26.0–34.0)
MCHC: 33 g/dL (ref 30.0–36.0)
MCV: 92.9 fL (ref 80.0–100.0)
NRBC: 0 % (ref 0.0–0.2)
Platelets: 125 10*3/uL — ABNORMAL LOW (ref 150–400)
RBC: 4.08 MIL/uL — ABNORMAL LOW (ref 4.22–5.81)
RDW: 13.7 % (ref 11.5–15.5)
WBC: 4 10*3/uL (ref 4.0–10.5)

## 2019-01-20 LAB — BASIC METABOLIC PANEL
ANION GAP: 8 (ref 5–15)
BUN: 23 mg/dL (ref 8–23)
CO2: 23 mmol/L (ref 22–32)
Calcium: 8.8 mg/dL — ABNORMAL LOW (ref 8.9–10.3)
Chloride: 105 mmol/L (ref 98–111)
Creatinine, Ser: 1.03 mg/dL (ref 0.61–1.24)
GFR calc Af Amer: >60 mL/min (ref >60)
GLUCOSE: 150 mg/dL — AB (ref 70–99)
POTASSIUM: 4.1 mmol/L (ref 3.5–5.1)
Sodium: 136 mmol/L (ref 135–145)

## 2019-01-20 LAB — URINALYSIS, ROUTINE W REFLEX MICROSCOPIC
Bilirubin Urine: NEGATIVE
Glucose, UA: NEGATIVE mg/dL
Hgb urine dipstick: NEGATIVE
Ketones, ur: NEGATIVE mg/dL
Leukocytes, UA: NEGATIVE
Nitrite: NEGATIVE
Protein, ur: NEGATIVE mg/dL
Specific Gravity, Urine: 1.008 (ref 1.005–1.030)
pH: 6 (ref 5.0–8.0)

## 2019-01-20 LAB — ECHOCARDIOGRAM COMPLETE
Height: 64 in
Weight: 2304 oz

## 2019-01-20 LAB — I-STAT TROPONIN, ED: TROPONIN I, POC: 0.01 ng/mL (ref 0.00–0.08)

## 2019-01-20 LAB — TROPONIN I
Troponin I: <0.03 ng/mL (ref <0.03)
Troponin I: <0.03 ng/mL (ref <0.03)

## 2019-01-20 LAB — TSH: TSH: 2.258 u[IU]/mL (ref 0.350–4.500)

## 2019-01-20 MED ORDER — LACTATED RINGERS IV SOLN
INTRAVENOUS | Status: DC
  Administered 2019-01-20 – 2019-01-21 (×2): via INTRAVENOUS

## 2019-01-20 MED ORDER — MELATONIN 3 MG PO TABS
1.0000 | ORAL_TABLET | Freq: Every evening | ORAL | Status: DC | PRN
  Administered 2019-01-20 – 2019-01-22 (×3): 3 mg via ORAL
  Filled 2019-01-20 (×6): qty 1

## 2019-01-20 MED ORDER — SACUBITRIL-VALSARTAN 49-51 MG PO TABS
1.0000 | ORAL_TABLET | Freq: Two times a day (BID) | ORAL | Status: DC
  Administered 2019-01-20 – 2019-01-23 (×6): 1 via ORAL
  Filled 2019-01-20 (×6): qty 1

## 2019-01-20 MED ORDER — ACETAMINOPHEN 650 MG RE SUPP: 650.0000 mg | Freq: Four times a day (QID) | RECTAL | Status: DC | PRN

## 2019-01-20 MED ORDER — ONDANSETRON HCL 4 MG/2ML IJ SOLN: 4.0000 mg | Freq: Four times a day (QID) | INTRAMUSCULAR | Status: DC | PRN

## 2019-01-20 MED ORDER — SODIUM CHLORIDE 0.9% FLUSH
3.0000 mL | Freq: Two times a day (BID) | INTRAVENOUS | Status: DC
  Administered 2019-01-20 – 2019-01-23 (×4): 3 mL via INTRAVENOUS

## 2019-01-20 MED ORDER — TAMSULOSIN HCL 0.4 MG PO CAPS
0.4000 mg | ORAL_CAPSULE | Freq: Every day | ORAL | Status: DC
  Administered 2019-01-21 – 2019-01-22 (×2): 0.4 mg via ORAL
  Filled 2019-01-20 (×2): qty 1

## 2019-01-20 MED ORDER — EZETIMIBE 10 MG PO TABS
5.0000 mg | ORAL_TABLET | Freq: Every day | ORAL | Status: DC
  Administered 2019-01-21 – 2019-01-23 (×3): 5 mg via ORAL
  Filled 2019-01-20 (×3): qty 1

## 2019-01-20 MED ORDER — ASPIRIN EC 81 MG PO TBEC
81.0000 mg | DELAYED_RELEASE_TABLET | Freq: Every day | ORAL | Status: DC
  Administered 2019-01-20 – 2019-01-22 (×3): 81 mg via ORAL
  Filled 2019-01-20 (×3): qty 1

## 2019-01-20 MED ORDER — ONDANSETRON HCL 4 MG PO TABS: 4.0000 mg | ORAL_TABLET | Freq: Four times a day (QID) | ORAL | Status: DC | PRN

## 2019-01-20 MED ORDER — ENOXAPARIN SODIUM 40 MG/0.4ML ~~LOC~~ SOLN
40.0000 mg | SUBCUTANEOUS | Status: DC
  Administered 2019-01-20 – 2019-01-22 (×3): 40 mg via SUBCUTANEOUS
  Filled 2019-01-20 (×3): qty 0.4

## 2019-01-20 MED ORDER — ACETAMINOPHEN 325 MG PO TABS
650.0000 mg | ORAL_TABLET | Freq: Four times a day (QID) | ORAL | Status: DC | PRN
  Administered 2019-01-20: 650 mg via ORAL
  Filled 2019-01-20: qty 2

## 2019-01-20 NOTE — Progress Notes (Signed)
  Echocardiogram 2D Echocardiogram has been performed.  Victor Waters 01/20/2019, 2:13 PM

## 2019-01-20 NOTE — ED Notes (Signed)
Pt aware that a urine sample is needed; pt unable to provide one at this time; urinal at bedside

## 2019-01-20 NOTE — ED Notes (Signed)
Patient transported to X-ray 

## 2019-01-20 NOTE — ED Notes (Signed)
Dr. Yates at bedside.  

## 2019-01-20 NOTE — ED Triage Notes (Addendum)
Pt from home via EMS; C/O Cp last night, non radiating, 5/10; hx HF, triple bypass, on Entresto; took 1 nitro, pain resolved; pt sitting in chair this am, sudden onset dizziness, had near syncopal episode; hx of same, 4 x in past month;  Prostate cancer pt, hx hypokalemia; receiving ezetimibe, last had in November; no n/v/d, diaphoresis, fever, recent illness; currently pt has no complaints; positive orthostatics w/ ems HR 80 to 98; pt states he had a syncopal episode in the bathroom this morning  324 ASA PTA 500 mL NS  146/62 HR 82 CBG 122 RR 14 98% RA

## 2019-01-20 NOTE — H&P (Signed)
History and Physical    Victor Waters XLK:[id number] DOB: 24-Jul-1926 DOA: 01/20/2019  PCP: Darreld Mclean, MD Consultants:  [facility] - cardiology; [place] - urology Patient coming from:  Home - lives with wife; NOK: Wife, 31-  Chief Complaint: Dizziness and LOC  HPI: Victor Waters is a 85 y.o. male with medical history significant of TIA; seizure; chronic systolic heart failure; CAD s/p CABG; prostate CA; renal artery stenosis; HTN; and HLD presenting with dizziness and LOC.  The last 3-4 days, he hasn't been feeling good in his head.  He went to pee this AM and he fell over next to the commode and couldn't get up.  +Dizziness.  No LOC.  He had let his dog out after getting up and noticed CP - he took NTG for this.  Left-sided CP which resolved with NTG.  He does not usually take NTG.  He has had intermittent pain in his chest these last few days too.  He has been weak since receiving shots from Dr. Diona Fanti for the cancer over the last year, according to his wife.  According to a note in 05/29/18 with his PCP, he started receiving Lupron injections in March 2019 and have been weak since.  This was also noted on clinic note from 08/25/18.   ED Course:  Chest pain this AM and near syncope.  Took NTG and fell to the floor.  Has also had 3-4 other falls recently, similar in nature.  Headaches - negative CT.  Unremarkable UA, CXR.  Review of Systems: As per HPI; otherwise review of systems reviewed and negative.   Ambulatory Status:  Ambulates without assistance  Past Medical History:  Diagnosis Date  . Anxiety   . Coronary artery disease    a. s/p CABG;  b. LHC (2/11):  LM 30-40, pLAD 95-99 then 80 and 90, mCFX 90, pRCA occluded, S-dRCA ok, S-D1 ok, S-OM patent with 50, L-LAD ok.  Med Rx recommended.  c.  Myoview (5/13):  Low risk with small fixed inf defect c/w scar, no ischemia, EF 48%.  d.  Echo (6/13):  EF 55-60%, inf HK, Gr 1 DD, MAC;  e. Lexiscan Myoview (10/14):  Low risk, EF  38%, inferobasal infarct, no ischemia.  . Fatigue   . Hx of echocardiogram    Echo (10/14):  EF 55-60%, Gr 1DD, MAC, mild MR, mild LAE.  Marland Kitchen Hyperlipidemia   . Hypertension   . Hyponatremia 09/2012   Associated with postural hypotension and confusion  . Insomnia   . Myocardial infarction ([place]) 2003   hx of   . Renal artery stenosis (HCC)    bilateral;  s/p R RA stent 2011  . Seizure ([place]) 2015   unknow etiology and year of seizure???  . Syncope and collapse 08/31/2016  . Systolic heart failure (HCC)    CHRONIC  . TIA (transient ischemic attack)     Past Surgical History:  Procedure Laterality Date  . CARDIAC CATHETERIZATION  2003   revdeling severe three-vessel disease  . CORONARY ARTERY BYPASS GRAFT     LIMA to LAD, SVG to diagonal, SVG to RCA, and SVG to circumflex  . POLYPECTOMY     colon  . RENAL ARTERY STENT  01-28-10    Social History   Socioeconomic History  . Marital status: Married    Spouse name: Not on file  . Number of children: Not on file  . Years of education: Not on file  . Highest education level: Not  on file  Occupational History  . Occupation: retired  Scientific laboratory technician  . Financial resource strain: Not on file  . Food insecurity:    Worry: Not on file    Inability: Not on file  . Transportation needs:    Medical: Not on file    Non-medical: Not on file  Tobacco Use  . Smoking status: Never Smoker  . Smokeless tobacco: Never Used  Substance and Sexual Activity  . Alcohol use: No  . Drug use: No  . Sexual activity: Not Currently  Lifestyle  . Physical activity:    Days per week: Not on file    Minutes per session: Not on file  . Stress: Not on file  Relationships  . Social connections:    Talks on phone: Not on file    Gets together: Not on file    Attends religious service: Not on file    Active member of club or organization: Not on file    Attends meetings of clubs or organizations: Not on file    Relationship status: Not on file  .  Intimate partner violence:    Fear of current or ex partner: Not on file    Emotionally abused: Not on file    Physically abused: Not on file    Forced sexual activity: Not on file  Other Topics Concern  . Not on file  Social History Narrative   Married   Lives with wife and 8year old ggd    Allergies  Allergen Reactions  . Lisinopril Cough    Renal artery stenosis by history; ACE inhibitor  would be relatively contraindicated  . Zithromax [Azithromycin]     02/15/14 N&V  . Niacin Other (See Comments)    Burning sensations in head    Family History  Problem Relation Age of Onset  . Diabetes Mother   . Stroke Mother   . Heart attack Father 44       died  . Colon cancer Other        paternal grandfather    Prior to Admission medications   Medication Sig Start Date End Date Taking? Authorizing Provider  aspirin 81 MG tablet Take 81 mg by mouth at bedtime.    Yes [provider]  CALCIUM-MAGNESIUM-ZINC PO Take 1 tablet by mouth daily.   Yes [provider]  cholecalciferol (VITAMIN D) 1000 UNITS tablet Take 2,000 Units by mouth daily.   Yes [provider]  clotrimazole (LOTRIMIN) 1 % cream Apply 1 application topically 2 (two) times daily. Use for about 2 weeks Patient taking differently: Apply 1 application topically 2 (two) times daily as needed (rash). Use for about 2 weeks 09/14/18  Yes Copland, Gay Filler, MD  ENTRESTO 49-51 MG TAKE 1 TABLET BY MOUTH TWICE A DAY 09/26/18  Yes Minus Breeding, MD  ezetimibe (ZETIA) 10 MG tablet take 1/2 tablet by mouth once daily 03/06/18  Yes Copland, Gay Filler, MD  Melatonin 3 MG TBDP Take 1 tablet by mouth at bedtime as needed (sleep). Reported on 04/29/2016   Yes [provider]  nitroGLYCERIN (NITROSTAT) 0.4 MG SL tablet DISSOLVE 1 TAB UNDER TONGUE AS NEEDED FOR CHEST PAIN, MAY REPEAT IN 5 MINUTES AS DIRECTED. ER IF NO BETTER. 06/01/18  Yes Hochrein, Jeneen Rinks, MD  Omega-3 Fatty Acids (FISH OIL) 1200 MG CPDR  Take 1 capsule by mouth daily.   Yes [provider]  tamsulosin (FLOMAX) 0.4 MG CAPS capsule TAKE 1 CAPSULE BY MOUTH EVERY DAY 09/27/18  Yes Copland, Gay Filler, MD  vitamin C (ASCORBIC ACID) 500 MG tablet Take 500 mg by mouth daily.   Yes [provider]  atorvastatin (LIPITOR) 40 MG tablet Take 1 tablet (40 mg total) by mouth daily at 6 PM. Patient not taking: Reported on 01/20/2019 04/05/17   Rosita Fire, MD    Physical Exam: Vitals:   01/20/19 1030 01/20/19 1100 01/20/19 1130 01/20/19 1215  BP: (!) 147/65 (!) 147/75 (!) 156/85 (!) 145/72  Pulse: 92 87 93 84  Resp: (!) 22 18 (!) 23 13  Temp:      TempSrc:      SpO2: 98% 97% 98% 98%  Weight:      Height:         . General:  Appears calm and comfortable and is NAD . Eyes:  PERRL, EOMI, normal lids, iris . ENT:  grossly normal hearing, lips & tongue, mmm . Neck:  no LAD, masses or thyromegaly; no carotid bruits . Cardiovascular:  RRR, no m/r/g. No LE edema.  Marland Kitchen Respiratory:   CTA bilaterally with no wheezes/rales/rhonchi.  Normal respiratory effort. . Abdomen:  soft, NT, ND, NABS . Back:   normal alignment, no CVAT . Skin:  no rash or induration seen on limited exam . Musculoskeletal:  grossly normal tone BUE/BLE, good ROM, no bony abnormality . Psychiatric:  grossly normal mood and affect, speech fluent and appropriate, AOx3 . Neurologic:  CN 2-12 grossly intact, moves all extremities in coordinated fashion, sensation intact    Radiological Exams on Admission: Dg Chest 2 View  Result Date: 01/20/2019 CLINICAL DATA:  Chest pain after fall. EXAM: CHEST - 2 VIEW COMPARISON:  Radiographs of August 31, 2016. FINDINGS: The heart size and mediastinal contours are within normal limits. Atherosclerosis of thoracic aorta is noted. No pneumothorax or pleural effusion is noted. Status post coronary artery bypass graft. Both lungs are clear. The visualized skeletal structures are unremarkable. IMPRESSION: No active  cardiopulmonary disease. Aortic Atherosclerosis (ICD10-I70.0). Electronically Signed   By: Marijo Conception, M.D.   On: 01/20/2019 09:34   Ct Head Wo Contrast  Result Date: 01/20/2019 CLINICAL DATA:  Posttraumatic headache after fall. EXAM: CT HEAD WITHOUT CONTRAST TECHNIQUE: Contiguous axial images were obtained from the base of the skull through the vertex without intravenous contrast. COMPARISON:  CT scan of April 04, 2017. FINDINGS: Brain: Mild diffuse cortical atrophy is noted. Mild chronic ischemic white matter disease is noted. No mass effect or midline shift is noted. Ventricular size is within normal limits. There is no evidence of mass lesion, hemorrhage or acute infarction. Vascular: No hyperdense vessel or unexpected calcification. Skull: Normal. Negative for fracture or focal lesion. Sinuses/Orbits: No acute finding. Other: None. IMPRESSION: Mild diffuse cortical atrophy. Mild chronic ischemic white matter disease. No acute intracranial abnormality seen. Electronically Signed   By: Marijo Conception, M.D.   On: 01/20/2019 09:31    EKG: Independently reviewed.  NSR with rate 78; nonspecific ST changes with no evidence of acute ischemia   Labs on Admission: I have personally reviewed the available labs and imaging studies at the time of the admission.  Pertinent labs:   Glucose 150 Troponin 0.01 WBC 4.0 Hgb 12.5 Platelets 125 UA WNL Normal TSH 08/25/18  Assessment/Plan Principal Problem:   Near syncope Active Problems:   Essential hypertension   Chronic systolic heart failure (HCC)   Prostate cancer (HCC)   Hyperglycemia   Near syncope -Patient with chronic fatigue since start Lupron injections -He  has been seen for this issue twice by his PCP -Most likely etiology appears to be related to deconditioning, possibly complicated by Lupron injections. -Unremarkable ER evaluation -Vague description of CP with negative troponin x 1; will trend -Overnight observation -Will monitor  on telemetry -Orthostatic vital signs now and in AM -2d echo -Neuro checks  -PT/OT eval and treat  Chronic systolic CHF -Prior h/o in 9/27 but normalized EF in 4/18 -Will repeat Echo -Continue Entresto -Appears to be compensated at this time  Prostate CA -Records not available since patient is seen at [place] -Per PCP notes, patient has had persistent fatigue since starting Lupron -May or may not be related to Lupron, but risk:benefit discussion with urology is reasonable -Suggest outpatient urology f/u  HTN -Continue Entresto  Hyperglycemia -May be stress response -Will follow with fasting AM labs -It is unlikely that he will need acute or chronic treatment for this issue  DVT prophylaxis:  Lovenox  Code Status:  DNR - confirmed with patient/family Family Communication: Wife present throughout evaluation  Disposition Plan:  Home once clinically improved Consults called: PT/OT  Admission status: It is my clinical opinion that referral for OBSERVATION is reasonable and necessary in this patient based on the above information provided. The aforementioned taken together are felt to place the patient at high risk for further clinical deterioration. However it is anticipated that the patient may be medically stable for discharge from the hospital within 24 to 48 hours.    Karmen Bongo MD [facility]   How to contact the [facility] Attending or Consulting provider [place] or covering provider during after hours [place], for this patient?  1. Check the care team in [facility] and look for a) attending/consulting [facility] provider listed and b) the [facility] team listed 2. Log into [email or web address] and use [place] universal password to access. If you do not have the password, please contact the hospital operator. 3. Locate the [facility] provider you are looking for under [facility] and page to a number that you can be directly reached. 4. If you still have difficulty reaching the provider,  please page the [facility] (Director on Call) for the Hospitalists listed on amion for assistance.   01/20/2019, 12:39 PM

## 2019-01-20 NOTE — Plan of Care (Signed)
  Problem: Education: Goal: Knowledge of General Education information will improve Description: Including pain rating scale, medication(s)/side effects and non-pharmacologic comfort measures Outcome: Progressing   Problem: Health Behavior/Discharge Planning: Goal: Ability to manage health-related needs will improve Outcome: Progressing   Problem: Activity: Goal: Risk for activity intolerance will decrease Outcome: Progressing   

## 2019-01-20 NOTE — ED Provider Notes (Signed)
[facility]ERGENC[facility] DEPARTMENT Provider Note   CSN: [id number] Arrival date & time: 01/20/19  2774     History   Chief Complaint Chief Complaint  Patient presents with  . Dizziness  . Loss of Consciousness    HPI Victor Waters is a 85 y.o. male.  Patient with h/o CABG, prostate CA, syncope --presents the emergency department with complaint of lightheadedness and fall.  Patient states that he got up this morning at approximately 6:15 AM to let his dog out.  Patient was sitting in a chair while the dog was outside.  When the dog came back and the patient walked into his bathroom to use the restroom.  In the restroom, patient suddenly fell to the ground.  He remembers the incident and denies fully passing out.  He denies tripping over any objects.  Patient's wife came to see him and called ambulance.  Patient was transported to the emergency department by EMS.  Patient was given aspirin and 500 cc normal saline prior to arrival.  Blood sugar was 122.  EMS also reports that the patient had an episode of chest pain last night for which he took nitroglycerin.  Patient denies any fevers, URI symptoms, chest pain, shortness of breath, abdominal pain.  No recent nausea, vomiting, or diarrhea.  No difficulty with urination.  Patient with history of heart failure however last echocardiogram in 2018 showed recovery of ejection fraction.  Patient followed by Dr. Percival Spanish.  Previous hospitalization in 08/2016 for dizziness and syncope with associated headache.  Normal EEG performed at that time.  Also admitted in 03/2017 for dizziness with some possible speech difficulty.  Patient was evaluated with MRI, carotid Dopplers, echocardiogram.  Started on a statin during that hospitalization.     Past Medical History:  Diagnosis Date  . Anxiety   . Coronary artery disease    a. s/p CABG;  b. LHC (2/11):  LM 30-40, pLAD 95-99 then 80 and 90, mCFX 90, pRCA occluded, S-dRCA ok, S-D1 ok,  S-OM patent with 50, L-LAD ok.  Med Rx recommended.  c.  Myoview (5/13):  Low risk with small fixed inf defect c/w scar, no ischemia, EF 48%.  d.  Echo (6/13):  EF 55-60%, inf HK, Gr 1 DD, MAC;  e. Lexiscan Myoview (10/14):  Low risk, EF 38%, inferobasal infarct, no ischemia.  . Fatigue   . Hx of echocardiogram    Echo (10/14):  EF 55-60%, Gr 1DD, MAC, mild MR, mild LAE.  Marland Kitchen Hyperlipidemia   . Hypertension   . Hyponatremia 09/2012   Associated with postural hypotension and confusion  . Insomnia   . Myocardial infarction ([place]) 2003   hx of   . Renal artery stenosis (HCC)    bilateral;  s/p R RA stent 2011  . Seizure ([place]) 2015   unknow etiology and year of seizure???  . Syncope and collapse 08/31/2016  . Systolic heart failure (HCC)    CHRONIC  . TIA (transient ischemic attack)     Patient Active Problem List   Diagnosis Date Noted  . TIA (transient ischemic attack) 04/04/2017  . Facial droop 04/04/2017  . Localized swelling of lower extremity 04/04/2017  . BPH (benign prostatic hyperplasia) 04/04/2017  . Stroke-like symptoms 04/04/2017  . Syncope and collapse 08/31/2016  . Chronic systolic heart failure ([place])   . Cerebral infarction, remote, resolved   . Mild dementia ([place]) 08/10/2016  . Sleep disturbance 01/06/2016  . Ventricular ectopy 12/31/2014  . Acute  confusional state 12/31/2014  . Palpitations   . Nocturia 09/20/2013  . Hyponatremia 09/29/2012  . Fatigue 05/09/2012  . PERSONAL HISTORY OTHER DISORDER URINARY SYSTEM 12/22/2010  . Thrombocytopenia ([place]) 08/04/2010  . LEUKOCYTOPENIA UNSPECIFIED 08/04/2010  . TINNITUS, CHRONIC 07/06/2010  . RENAL ARTERY STENOSIS 11/25/2009  . RENAL INSUFFICIENCY 11/25/2009  . DYSPNEA 09/24/2009  . BRADYCARDIA 09/04/2009  . HYPOTENSION-ORTHOSTATIC 09/04/2009  . DIZZINESS 09/04/2009  . HYPERLIPIDEMIA 01/31/2008  . DEPRESSIVE DISORDER 01/23/2008  . Essential hypertension 01/23/2008  . ANXIETY 06/02/2007  . Coronary atherosclerosis  06/02/2007  . Insomnia 06/02/2007    Past Surgical History:  Procedure Laterality Date  . CARDIAC CATHETERIZATION  2003   revdeling severe three-vessel disease  . CORONARY ARTERY BYPASS GRAFT     LIMA to LAD, SVG to diagonal, SVG to RCA, and SVG to circumflex  . POLYPECTOMY     colon  . RENAL ARTERY STENT  01-28-10        Home Medications    Prior to Admission medications   Medication Sig Start Date End Date Taking? Authorizing Provider  aspirin 81 MG tablet Take 81 mg by mouth at bedtime.     [provider]  atorvastatin (LIPITOR) 40 MG tablet Take 1 tablet (40 mg total) by mouth daily at 6 PM. 04/05/17   Rosita Fire, MD  CALCIUM-MAGNESIUM-ZINC PO Take 1 tablet by mouth daily.    [provider]  cholecalciferol (VITAMIN D) 1000 UNITS tablet Take 2,000 Units by mouth daily.    [provider]  clotrimazole (LOTRIMIN) 1 % cream Apply 1 application topically 2 (two) times daily. Use for about 2 weeks 09/14/18   Copland, Gay Filler, MD  ENTRESTO 49-51 MG TAKE 1 TABLET BY MOUTH TWICE A DAY 09/26/18   Minus Breeding, MD  ezetimibe (ZETIA) 10 MG tablet take 1/2 tablet by mouth once daily 03/06/18   Copland, Gay Filler, MD  Melatonin 3 MG TBDP Take 1 tablet by mouth at bedtime as needed (sleep). Reported on 04/29/2016    [provider]  nitroGLYCERIN (NITROSTAT) 0.4 MG SL tablet DISSOLVE 1 TAB UNDER TONGUE AS NEEDED FOR CHEST PAIN, MAY REPEAT IN 5 MINUTES AS DIRECTED. ER IF NO BETTER. 06/01/18   Minus Breeding, MD  tamsulosin (FLOMAX) 0.4 MG CAPS capsule TAKE 1 CAPSULE BY MOUTH EVERY DAY 09/27/18   Copland, Gay Filler, MD    Family History Family History  Problem Relation Age of Onset  . Diabetes Mother   . Stroke Mother   . Heart attack Father 54       died  . Colon cancer Other        paternal grandfather    Social History Social History   Tobacco Use  . Smoking status: Never Smoker  . Smokeless tobacco: Never Used  Substance Use  Topics  . Alcohol use: No  . Drug use: No     Allergies   Lisinopril; Zithromax [azithromycin]; and Niacin   Review of Systems Review of Systems  Constitutional: Negative for diaphoresis and fever.  Eyes: Negative for redness.  Respiratory: Negative for cough and shortness of breath.   Cardiovascular: Positive for chest pain. Negative for palpitations and leg swelling.  Gastrointestinal: Negative for abdominal pain, nausea and vomiting.  Genitourinary: Negative for dysuria.  Musculoskeletal: Negative for back pain and neck pain.  Skin: Negative for rash.  Neurological: Positive for weakness, light-headedness and headaches. Negative for syncope, facial asymmetry and numbness.  Psychiatric/Behavioral: The patient is not nervous/anxious.  Physical Exam Updated Vital Signs BP (!) 137/59   Pulse 74   Temp 97.7 F (36.5 C) (Oral)   Resp 15   Ht _0  (1.626 m)   Wt 65.3 kg   SpO2 100%   BMI 24.72 kg/m   Physical Exam Vitals signs and nursing note reviewed.  Constitutional:      Appearance: He is well-developed. He is not diaphoretic.  HENT:     Head: Normocephalic and atraumatic.     Mouth/Throat:     Mouth: Mucous membranes are moist. Mucous membranes are not dry.  Eyes:     Conjunctiva/sclera: Conjunctivae normal.  Neck:     Musculoskeletal: Normal range of motion and neck supple. No muscular tenderness.     Vascular: Normal carotid pulses. No carotid bruit or JVD.     Trachea: Trachea normal. No tracheal deviation.  Cardiovascular:     Rate and Rhythm: Normal rate and regular rhythm.     Pulses: No decreased pulses.     Heart sounds: Normal heart sounds, S1 normal and S2 normal. Heart sounds not distant. No murmur.  Pulmonary:     Effort: Pulmonary effort is normal. No respiratory distress.     Breath sounds: Normal breath sounds. No wheezing.  Chest:     Chest wall: No tenderness.  Abdominal:     General: Bowel sounds are normal.     Palpations:  Abdomen is soft.     Tenderness: There is no abdominal tenderness. There is no guarding or rebound.  Skin:    General: Skin is warm and dry.     Coloration: Skin is not pale.  Neurological:     General: No focal deficit present.     Mental Status: He is alert and oriented to person, place, and time.     Cranial Nerves: No cranial nerve deficit.     Sensory: No sensory deficit.     Motor: No weakness.      ED Treatments / Results  Labs (all labs ordered are listed, but only abnormal results are displayed) Labs Reviewed  CBC - Abnormal; Notable for the following components:      Result Value   RBC 4.08 (*)    Hemoglobin 12.5 (*)    HCT 37.9 (*)    Platelets 125 (*)    All other components within normal limits  BASIC METABOLIC PANEL - Abnormal; Notable for the following components:   Glucose, Bld 150 (*)    Calcium 8.8 (*)    All other components within normal limits  URINALYSIS, ROUTINE W REFLEX MICROSCOPIC  I-STAT TROPONIN, ED    EKG EKG Interpretation  Date/Time:  Saturday January 20 2019 07:40:13 EST Ventricular Rate:  78 PR Interval:    QRS Duration: 116 QT Interval:  434 QTC Calculation: 495 R Axis:   54 Text Interpretation:  Interpretation limited secondary to artifact , suspect sinus rhythm but may be junctional Nonspecific intraventricular conduction delay Borderline T abnormalities, inferior leads Baseline wander in lead(s) III Otherwise no significant change Confirmed by Gareth Morgan [id number]) on 01/20/2019 7:55:59 AM   Radiology Dg Chest 2 View  Result Date: 01/20/2019 CLINICAL DATA:  Chest pain after fall. EXAM: CHEST - 2 VIEW COMPARISON:  Radiographs of August 31, 2016. FINDINGS: The heart size and mediastinal contours are within normal limits. Atherosclerosis of thoracic aorta is noted. No pneumothorax or pleural effusion is noted. Status post coronary artery bypass graft. Both lungs are clear. The visualized skeletal structures are unremarkable.  IMPRESSION: No active cardiopulmonary disease. Aortic Atherosclerosis (ICD10-I70.0). Electronically Signed   By: Marijo Conception, M.D.   On: 01/20/2019 09:34   Ct Head Wo Contrast  Result Date: 01/20/2019 CLINICAL DATA:  Posttraumatic headache after fall. EXAM: CT HEAD WITHOUT CONTRAST TECHNIQUE: Contiguous axial images were obtained from the base of the skull through the vertex without intravenous contrast. COMPARISON:  CT scan of April 04, 2017. FINDINGS: Brain: Mild diffuse cortical atrophy is noted. Mild chronic ischemic white matter disease is noted. No mass effect or midline shift is noted. Ventricular size is within normal limits. There is no evidence of mass lesion, hemorrhage or acute infarction. Vascular: No hyperdense vessel or unexpected calcification. Skull: Normal. Negative for fracture or focal lesion. Sinuses/Orbits: No acute finding. Other: None. IMPRESSION: Mild diffuse cortical atrophy. Mild chronic ischemic white matter disease. No acute intracranial abnormality seen. Electronically Signed   By: Marijo Conception, M.D.   On: 01/20/2019 09:31    Procedures Procedures (including critical care time)  Medications Ordered in ED Medications - No data to display   Initial Impression / Assessment and Plan / ED Course  I have reviewed the triage vital signs and the nursing notes.  Pertinent labs & imaging results that were available during my care of the patient were reviewed by me and considered in my medical decision making (see chart for details).     Patient seen and examined. Work-up initiated. Medications ordered. EKG reviewed. More pronounced t-wave inversions inferior today when compared to previous however these inversions were demonstrated in 2017/18.   Vital signs reviewed and are as follows: BP (!) 137/59   Pulse 74   Temp 97.7 F (36.5 C) (Oral)   Resp 15   Ht _0  (1.626 m)   Wt 65.3 kg   SpO2 100%   BMI 24.72 kg/m   Patient discussed with Dr. Billy Fischer.    10:52 AM Patient and wife updated on results.  We discussed how to proceed.  They would prefer admission versus discharge at this point.  I spoke with Dr. Lorin Mercy of [facility].  They will monitor overnight.  Final Clinical Impressions(s) / ED Diagnoses   Final diagnoses:  Precordial pain  Near syncope   Near syncope and CP in setting of CHF, prior CABG.   ED Discharge Orders    None       Carlisle Cater, Hershal Coria 01/20/19 1055    Gareth Morgan, MD 01/22/19 [phone]

## 2019-01-21 LAB — BASIC METABOLIC PANEL
Anion gap: 10 (ref 5–15)
BUN: 20 mg/dL (ref 8–23)
CO2: 23 mmol/L (ref 22–32)
Calcium: 9.2 mg/dL (ref 8.9–10.3)
Chloride: 102 mmol/L (ref 98–111)
Creatinine, Ser: 1.02 mg/dL (ref 0.61–1.24)
GFR calc Af Amer: >60 mL/min (ref >60)
GFR calc non Af Amer: >60 mL/min (ref >60)
Glucose, Bld: 114 mg/dL — ABNORMAL HIGH (ref 70–99)
Potassium: 4 mmol/L (ref 3.5–5.1)
Sodium: 135 mmol/L (ref 135–145)

## 2019-01-21 LAB — CBC
HCT: 39.3 % (ref 39.0–52.0)
HEMOGLOBIN: 13.1 g/dL (ref 13.0–17.0)
MCH: 30 pg (ref 26.0–34.0)
MCHC: 33.3 g/dL (ref 30.0–36.0)
MCV: 89.9 fL (ref 80.0–100.0)
Platelets: 131 10*3/uL — ABNORMAL LOW (ref 150–400)
RBC: 4.37 MIL/uL (ref 4.22–5.81)
RDW: 13.7 % (ref 11.5–15.5)
WBC: 4.7 10*3/uL (ref 4.0–10.5)
nRBC: 0 % (ref 0.0–0.2)

## 2019-01-21 LAB — TROPONIN I
Troponin I: <0.03 ng/mL (ref <0.03)
Troponin I: <0.03 ng/mL (ref <0.03)
Troponin I: <0.03 ng/mL (ref <0.03)

## 2019-01-21 LAB — VITAMIN B12: Vitamin B-12: 435 pg/mL (ref 180–914)

## 2019-01-21 MED ORDER — NITROGLYCERIN 0.4 MG SL SUBL
0.4000 mg | SUBLINGUAL_TABLET | SUBLINGUAL | Status: DC | PRN
  Administered 2019-01-21 (×2): 0.4 mg via SUBLINGUAL

## 2019-01-21 MED ORDER — SODIUM CHLORIDE 0.9 % IV SOLN: INTRAVENOUS | Status: DC

## 2019-01-21 MED ORDER — NITROGLYCERIN 0.4 MG SL SUBL
SUBLINGUAL_TABLET | SUBLINGUAL | Status: AC
  Filled 2019-01-21: qty 1

## 2019-01-21 MED ORDER — SODIUM CHLORIDE 0.9 % IV SOLN
INTRAVENOUS | Status: AC
  Administered 2019-01-21 (×2): via INTRAVENOUS

## 2019-01-21 NOTE — Evaluation (Addendum)
Occupational Therapy Evaluation Patient Details Name: Victor Waters MRN: [id number] DOB: [birth year] Today's Date: 01/21/2019    History of Present Illness Pt is a 85yo male s/p chest pain and fall. Pt adminstered NTG at home.  CT C spine was negative for acute. CT head showed mild diffused cortical atrophy but no acute abnormalities. Pt with headache. PMHx: TIA. seizure, heart failure, CAD s/p CABG, HLD, HTN, seizure.   Clinical Impression   Pt is a 85 yo male s/p above dx. Pt PTA: living with spouse, independently for self care and 4WW with mobility. Pt currently with no focal deficits. Pt's SpO2 levels remained >93% on RA; HR <135 BPM with exertion and BP 127/95 s/p exertion. Pt performing ADLs at sink in standing and ambulating with RW 500' with no LOB episode and no dizziness reported throughout. Pt placed on safety alarm in chair. Pt does not require OT follow-up at this time as pt appears to be at his functional baseline. Thank you.    Follow Up Recommendations  No OT follow up    Equipment Recommendations       Recommendations for Other Services       Precautions / Restrictions Precautions Precautions: Fall Restrictions Weight Bearing Restrictions: No      Mobility Bed Mobility Overal bed mobility: Modified Independent                Transfers Overall transfer level: Needs assistance   Transfers: Sit to/from Stand Sit to Stand: Supervision              Balance Overall balance assessment: Mild deficits observed, not formally tested                                         ADL either performed or assessed with clinical judgement   ADL Overall ADL's : At baseline                                       General ADL Comments: Pt requires RW for stability during ADL functional mobility- SUpervisionA for all ADLs. Pt stood for urination with no AD.     Vision Baseline Vision/History: No visual deficits Vision  Assessment?: No apparent visual deficits     Perception     Praxis      Pertinent Vitals/Pain Pain Assessment: 0-10 Pain Score: 3  Pain Location: headache Pain Descriptors / Indicators: Discomfort Pain Intervention(s): Limited activity within patient's tolerance     Hand Dominance Right   Extremity/Trunk Assessment Upper Extremity Assessment Upper Extremity Assessment: Overall WFL for tasks assessed   Lower Extremity Assessment Lower Extremity Assessment: Overall WFL for tasks assessed   Cervical / Trunk Assessment Cervical / Trunk Assessment: Normal   Communication Communication Communication: No difficulties   Cognition Arousal/Alertness: Awake/alert Behavior During Therapy: WFL for tasks assessed/performed Overall Cognitive Status: Within Functional Limits for tasks assessed                                     General Comments  pt ambulating 500' with RW, no SOB- SpO2 levels 93% on RA; HR <135 BPM.    Exercises     Shoulder Instructions      Home Living Family/patient expects  to be discharged to:: Private residence Living Arrangements: Spouse/significant other Available Help at Discharge: Family;Available 24 hours/day(grandchildren stay with them) Type of Home: House Home Access: Stairs to enter [employer] of Steps: 5   Home Layout: One level     Bathroom Shower/Tub: Occupational psychologist: Handicapped height     Home Equipment: Environmental consultant - 4 wheels;Shower seat          Prior Functioning/Environment Level of Independence: Independent with assistive device(s)                 OT Problem List:        OT Treatment/Interventions:      OT Goals(Current goals can be found in the care plan section)    OT Frequency:     Barriers to D/C:            Co-evaluation              AM-PAC OT "6 Clicks" Daily Activity     Outcome Measure Help from another person eating meals?: None Help from another  person taking care of personal grooming?: None Help from another person toileting, which includes using toliet, bedpan, or urinal?: None Help from another person bathing (including washing, rinsing, drying)?: A Little Help from another person to put on and taking off regular upper body clothing?: None Help from another person to put on and taking off regular lower body clothing?: None 6 Click Score: 23   End of Session Equipment Utilized During Treatment: Gait belt;Rolling walker Nurse Communication: Mobility status  Activity Tolerance: Patient tolerated treatment well Patient left: in chair;with call bell/phone within reach;with chair alarm set  OT Visit Diagnosis: Unsteadiness on feet (R26.81);History of falling (Z91.81)                Time: 6389-3734 OT Time Calculation (min): 31 min Charges:  OT General Charges $OT Visit: 1 Visit OT Evaluation $OT Eval Moderate Complexity: 1 Mod OT Treatments $Self Care/Home Management : 8-22 mins  Ebony Hail Harold Hedge) Marsa Aris OTR/L Acute Rehabilitation Services Pager: [phone] Office: [phone]   Fredda Hammed 01/21/2019, 9:15 AM

## 2019-01-21 NOTE — Progress Notes (Signed)
Patient complaining of chest pain.

## 2019-01-21 NOTE — Progress Notes (Signed)
Patient refused a second nitro despite having chest pain of 4/10, it started out as 6/10 and patient says it is easing off and he doesn't need another nitro. Also placed O2 via nasal cannula at 2L.

## 2019-01-21 NOTE — Progress Notes (Signed)
Patient has been up frequently to urinate. Patient starting to get agitated D/T getting up and the bed alarm going off. RN reminded pt that he needs to call for assistance. Turned off lights and helped pt to bed so he can relax and possibly sleep.

## 2019-01-21 NOTE — Care Management Obs Status (Signed)
[facility] NOTIFICATION   Patient Details  Name: DEMAREA LOREY MRN: [id number] Date of Birth: [birth year]   Medicare Observation Status Notification Given:  Yes    Carles Collet, RN 01/21/2019, 11:22 AM

## 2019-01-21 NOTE — Progress Notes (Addendum)
PROGRESS NOTE    Victor Waters  QBH:[id number] DOB: 04-09-1926 DOA: 01/20/2019 PCP: Darreld Mclean, MD   Brief Narrative: Patient is a 85 year old male with past medical history of TIA, seizure disorder, chronic systolic CHF, coronary disease status post CABG, prostate cancer, carotid stenosis, hypertension, hyperlipidemia who presented from home to the emergency department with complaints of dizziness and syncopal episode.  He also had intermittent chest pain since last few days.  Orthostatic vitals checked this morning were positive.  Waiting for PT/OT evaluation.  Assessment & Plan:   Principal Problem:   Near syncope Active Problems:   Essential hypertension   Chronic systolic heart failure (HCC)   Prostate cancer (HCC)   Hyperglycemia  Syncopal episode: Syncopized during micturition in the bathroom.  Could be vasovagal.  Orthostatic vitals checked this morning were positive so it could be associated with orthostatic hypotension.  Systolic blood pressure dropped from 168 mmHg to 136 mmHg from supine to standing position. We will continue gentle IV fluids. Patient will be evaluated  by PT/OT today.  Chest pain: Atypical chest pain.  Has been complaining of chest pain since last few days.  Pain is midsternal, sharp not associated with exertion.  This morning he was complaining of chest pain and it was relieved with nitroglycerin.  EKG done this morning did not show any ST changes, showed first-degree AV block.  His troponins have been negative.  We will continue to cycle troponins.   Echocardiogram has been done which showed ejection fraction of 45 to 50%, mild global hypokinesis.  History of coronary disease: Status post CABG.  Continue current medications.  Continue aspirin  Chronic systolic OXB:[id number] has been done which showed ejection fraction of 45 to 50%, mild global hypokinesis.  Currently euvolemic.  On Entresto.  Prostate cancer: Follows with urology.  Treated  with Lupron.  Needs to follow-up with urology on discharge.  Continue Flomax.  Hypertension: Currently blood pressure stable.  Continue current medications  Thrombocytopenia: Chronic.  Currently stable.  We will continue to monitor        DVT prophylaxis:Lovenox Code Status: DNR Family Communication: None present at the bedside Disposition Plan: Depending upon PT/OT evaluation and resolution of chest pain and orthostatic hypotension   Consultants: None  Procedures: Echocardiogram  Antimicrobials:  Anti-infectives (From admission, onward)   None      Subjective: Patient seen and examined the bedside this morning.  Hemodynamically stable.  Complained of some chest pain this morning which was relieved by nitroglycerin.  Chest pain is atypical and not associated  with exertion.  Looks comfortable during my evaluation  Objective: Vitals:   01/21/19 0425 01/21/19 0712 01/21/19 0724 01/21/19 0744  BP: (!) 168/80 (!) 151/86 109/76 137/78  Pulse: 90 96 (!) 108 96  Resp: 18     Temp: 98.5 F (36.9 C)     TempSrc: Oral     SpO2: 97%     Weight: 65.4 kg     Height:        Intake/Output Summary (Last 24 hours) at 01/21/2019 9622 Last data filed at 01/21/2019 0710 Gross per 24 hour  Intake 1643.86 ml  Output 2401 ml  Net -757.14 ml   Filed Weights   01/20/19 0745 01/21/19 0425  Weight: 65.3 kg 65.4 kg    Examination:  General exam: Appears calm and comfortable ,Not in distress,elderly male  HEENT:PERRL,Oral mucosa moist, Ear/Nose normal on gross exam Respiratory system: Bilateral equal air entry, normal vesicular breath sounds, no wheezes  or crackles  Cardiovascular system: S1 & S2 heard, RRR. No JVD, murmurs, rubs, gallops or clicks. No pedal edema. Gastrointestinal system: Abdomen is nondistended, soft and nontender. No organomegaly or masses felt. Normal bowel sounds heard. Central nervous system: Alert and oriented. No focal neurological deficits. Extremities: No  edema, no clubbing ,no cyanosis, distal peripheral pulses palpable. Skin: No rashes, lesions or ulcers,no icterus ,no pallor MSK: Normal muscle bulk,tone ,power Psychiatry: Judgement and insight appear normal. Mood & affect appropriate.     Data Reviewed: I have personally reviewed following labs and imaging studies  CBC: Recent Labs  Lab 01/20/19 0742 01/21/19 0454  WBC 4.0 4.7  HGB 12.5* 13.1  HCT 37.9* 39.3  MCV 92.9 89.9  PLT 125* 379*   Basic Metabolic Panel: Recent Labs  Lab 01/20/19 0742 01/21/19 0454  NA 136 135  K 4.1 4.0  CL 105 102  CO2 23 23  GLUCOSE 150* 114*  BUN 23 20  CREATININE 1.03 1.02  CALCIUM 8.8* 9.2   GFR: Estimated Creatinine Clearance: 40.3 mL/min (by C-G formula based on SCr of 1.02 mg/dL). Liver Function Tests: No results for input(s): AST, ALT, ALKPHOS, BILITOT, PROT, ALBUMIN in the last 168 hours. No results for input(s): LIPASE, AMYLASE in the last 168 hours. No results for input(s): AMMONIA in the last 168 hours. Coagulation Profile: No results for input(s): INR, PROTIME in the last 168 hours. Cardiac Enzymes: Recent Labs  Lab 01/20/19 1302 01/20/19 1931  TROPONINI <0.03 <0.03   BNP (last 3 results) No results for input(s): PROBNP in the last 8760 hours. HbA1C: No results for input(s): HGBA1C in the last 72 hours. CBG: No results for input(s): GLUCAP in the last 168 hours. Lipid Profile: No results for input(s): CHOL, HDL, LDLCALC, TRIG, CHOLHDL, LDLDIRECT in the last 72 hours. Thyroid Function Tests: Recent Labs    01/20/19 1302  TSH 2.258   Anemia Panel: No results for input(s): VITAMINB12, FOLATE, FERRITIN, TIBC, IRON, RETICCTPCT in the last 72 hours. Sepsis Labs: No results for input(s): PROCALCITON, LATICACIDVEN in the last 168 hours.  No results found for this or any previous visit (from the past 240 hour(s)).       Radiology Studies: Dg Chest 2 View  Result Date: 01/20/2019 CLINICAL DATA:  Chest pain  after fall. EXAM: CHEST - 2 VIEW COMPARISON:  Radiographs of August 31, 2016. FINDINGS: The heart size and mediastinal contours are within normal limits. Atherosclerosis of thoracic aorta is noted. No pneumothorax or pleural effusion is noted. Status post coronary artery bypass graft. Both lungs are clear. The visualized skeletal structures are unremarkable. IMPRESSION: No active cardiopulmonary disease. Aortic Atherosclerosis (ICD10-I70.0). Electronically Signed   By: Marijo Conception, M.D.   On: 01/20/2019 09:34   Ct Head Wo Contrast  Result Date: 01/20/2019 CLINICAL DATA:  Posttraumatic headache after fall. EXAM: CT HEAD WITHOUT CONTRAST TECHNIQUE: Contiguous axial images were obtained from the base of the skull through the vertex without intravenous contrast. COMPARISON:  CT scan of April 04, 2017. FINDINGS: Brain: Mild diffuse cortical atrophy is noted. Mild chronic ischemic white matter disease is noted. No mass effect or midline shift is noted. Ventricular size is within normal limits. There is no evidence of mass lesion, hemorrhage or acute infarction. Vascular: No hyperdense vessel or unexpected calcification. Skull: Normal. Negative for fracture or focal lesion. Sinuses/Orbits: No acute finding. Other: None. IMPRESSION: Mild diffuse cortical atrophy. Mild chronic ischemic white matter disease. No acute intracranial abnormality seen. Electronically Signed  By: Marijo Conception, M.D.   On: 01/20/2019 09:31        Scheduled Meds: . aspirin EC  81 mg Oral QHS  . enoxaparin (LOVENOX) injection  40 mg Subcutaneous Q24H  . ezetimibe  5 mg Oral Daily  . sacubitril-valsartan  1 tablet Oral BID  . sodium chloride flush  3 mL Intravenous Q12H  . tamsulosin  0.4 mg Oral Daily   Continuous Infusions: . sodium chloride       LOS: 0 days    Time spent: 35 mins.More than 50% of that time was spent in counseling and/or coordination of care.      Shelly Coss, MD [facility] Pager  [phone]  If 7PM-7AM, please contact night-coverage [email or web address] Password [email or web address] 01/21/2019, 8:22 AM

## 2019-01-21 NOTE — Evaluation (Signed)
Physical Therapy Evaluation Patient Details Name: Victor Waters MRN: [id number] DOB: 1926/04/12 Today's Date: 01/21/2019   History of Present Illness  Pt is a 85yo male s/p chest pain and fall. Pt adminstered NTG at home.  CT C spine was negative for acute. CT head showed mild diffused cortical atrophy but no acute abnormalities. Pt with headache. PMHx: TIA. seizure, heart failure, CAD s/p CABG, HLD, HTN, seizure.  Clinical Impression  Pt admitted with above diagnosis. Pt currently with functional limitations due to the deficits listed below (see PT Problem List). Prior to admission, pt uses Rollator for mobility and enjoys walking for exercise. He denies history of falls in recent months. On PT evaluation, patient reporting 5/10 chest pain (RN notified) and ambulating 400 feet with walker and supervision. HR 104-107 bpm. Pt appears to be close to his functional baseline. Pt will benefit from skilled PT to increase their independence and safety with mobility to allow discharge to the venue listed below.       Follow Up Recommendations No PT follow up    Equipment Recommendations  None recommended by PT    Recommendations for Other Services       Precautions / Restrictions Precautions Precautions: Fall Restrictions Weight Bearing Restrictions: No      Mobility  Bed Mobility Overal bed mobility: Modified Independent             General bed mobility comments: OOB in chair  Transfers Overall transfer level: Needs assistance Equipment used: None Transfers: Sit to/from Stand Sit to Stand: Supervision            Ambulation/Gait Ambulation/Gait assistance: Supervision Gait Distance (Feet): 400 Feet Assistive device: Rolling walker (2 wheeled) Gait Pattern/deviations: Step-through pattern;Trunk flexed   Gait velocity interpretation: >2.62 ft/sec, indicative of community ambulatory General Gait Details: Pt with good gait speed, with increased trunk flexion throughout.  requires supervision for obstacles negotiation  Stairs            Wheelchair Mobility    Modified Rankin (Stroke Patients Only)       Balance Overall balance assessment: Mild deficits observed, not formally tested                                           Pertinent Vitals/Pain Pain Assessment: 0-10 Pain Score: 5  Pain Location: chest Pain Descriptors / Indicators: Discomfort Pain Intervention(s): Other (comment);Monitored during session(RN notified)    Home Living Family/patient expects to be discharged to:: Private residence Living Arrangements: Spouse/significant other Available Help at Discharge: Family;Available 24 hours/day(grandchildren stay with them) Type of Home: House Home Access: Stairs to enter   [employer] of Steps: 5 Home Layout: One level Home Equipment: Walker - 4 wheels;Shower seat      Prior Function Level of Independence: Independent with assistive device(s)         Comments: uses Rollator. Enjoys walking around track at [facility] with his wife     Hand Dominance   Dominant Hand: Right    Extremity/Trunk Assessment   Upper Extremity Assessment Upper Extremity Assessment: Overall WFL for tasks assessed    Lower Extremity Assessment Lower Extremity Assessment: Overall WFL for tasks assessed    Cervical / Trunk Assessment Cervical / Trunk Assessment: Kyphotic  Communication   Communication: No difficulties  Cognition Arousal/Alertness: Awake/alert Behavior During Therapy: WFL for tasks assessed/performed Overall Cognitive Status: No family/caregiver present to  determine baseline cognitive functioning                                 General Comments: Likely at cognitive baseline. Has decreased short term memory and has difficulty following multi step commands      General Comments General comments (skin integrity, edema, etc.): pt ambulating 500' with RW, no SOB- SpO2 levels 93% on RA; HR  <135 BPM.    Exercises     Assessment/Plan    PT Assessment Patient needs continued PT services  PT Problem List Decreased balance       PT Treatment Interventions DME instruction;Gait training;Functional mobility training;Therapeutic activities;Therapeutic exercise;Balance training;Patient/family education;Stair training    PT Goals (Current goals can be found in the Care Plan section)  Acute Rehab PT Goals Patient Stated Goal: "be healthy." PT Goal Formulation: With patient Time For Goal Achievement: 02/04/19 Potential to Achieve Goals: Good    Frequency Min 3X/week   Barriers to discharge        Co-evaluation               AM-PAC PT "6 Clicks" Mobility  Outcome Measure Help needed turning from your back to your side while in a flat bed without using bedrails?: None Help needed moving from lying on your back to sitting on the side of a flat bed without using bedrails?: None Help needed moving to and from a bed to a chair (including a wheelchair)?: None Help needed standing up from a chair using your arms (e.g., wheelchair or bedside chair)?: A Little Help needed to walk in hospital room?: A Little Help needed climbing 3-5 steps with a railing? : A Lot 6 Click Score: 20    End of Session   Activity Tolerance: Patient tolerated treatment well Patient left: in chair;with call bell/phone within reach;with chair alarm set Nurse Communication: Other (comment)(chest pain) PT Visit Diagnosis: Unsteadiness on feet (R26.81);Pain Pain - part of body: (chest)    Time: 1448-1856 PT Time Calculation (min) (ACUTE ONLY): 18 min   Charges:   PT Evaluation $PT Eval Moderate Complexity: 1 Mod         Ellamae Sia, [place], DPT Acute Rehabilitation Services Pager [phone] Office [phone]   Willy Eddy 01/21/2019, 10:17 AM

## 2019-01-21 NOTE — Progress Notes (Signed)
PATIENT IS HAVING A 6/10 CHEST PAIN MD PAGED, EKG DONE ONE NITRO GIVEN TO PATIENT

## 2019-01-22 DIAGNOSIS — I110 Hypertensive heart disease with heart failure: Secondary | ICD-10-CM | POA: Diagnosis present

## 2019-01-22 DIAGNOSIS — I252 Old myocardial infarction: Secondary | ICD-10-CM | POA: Diagnosis not present

## 2019-01-22 DIAGNOSIS — R072 Precordial pain: Secondary | ICD-10-CM | POA: Diagnosis present

## 2019-01-22 DIAGNOSIS — R296 Repeated falls: Secondary | ICD-10-CM | POA: Diagnosis present

## 2019-01-22 DIAGNOSIS — Z66 Do not resuscitate: Secondary | ICD-10-CM | POA: Diagnosis present

## 2019-01-22 DIAGNOSIS — I2510 Atherosclerotic heart disease of native coronary artery without angina pectoris: Secondary | ICD-10-CM | POA: Diagnosis present

## 2019-01-22 DIAGNOSIS — F419 Anxiety disorder, unspecified: Secondary | ICD-10-CM | POA: Diagnosis present

## 2019-01-22 DIAGNOSIS — I951 Orthostatic hypotension: Secondary | ICD-10-CM | POA: Diagnosis present

## 2019-01-22 DIAGNOSIS — Z7982 Long term (current) use of aspirin: Secondary | ICD-10-CM | POA: Diagnosis not present

## 2019-01-22 DIAGNOSIS — C61 Malignant neoplasm of prostate: Secondary | ICD-10-CM | POA: Diagnosis present

## 2019-01-22 DIAGNOSIS — Z79899 Other long term (current) drug therapy: Secondary | ICD-10-CM | POA: Diagnosis not present

## 2019-01-22 DIAGNOSIS — Z8249 Family history of ischemic heart disease and other diseases of the circulatory system: Secondary | ICD-10-CM | POA: Diagnosis not present

## 2019-01-22 DIAGNOSIS — E785 Hyperlipidemia, unspecified: Secondary | ICD-10-CM | POA: Diagnosis present

## 2019-01-22 DIAGNOSIS — F0390 Unspecified dementia without behavioral disturbance: Secondary | ICD-10-CM | POA: Diagnosis present

## 2019-01-22 DIAGNOSIS — I5022 Chronic systolic (congestive) heart failure: Secondary | ICD-10-CM | POA: Diagnosis present

## 2019-01-22 DIAGNOSIS — W1830XA Fall on same level, unspecified, initial encounter: Secondary | ICD-10-CM | POA: Diagnosis present

## 2019-01-22 DIAGNOSIS — Z951 Presence of aortocoronary bypass graft: Secondary | ICD-10-CM | POA: Diagnosis not present

## 2019-01-22 DIAGNOSIS — Z8673 Personal history of transient ischemic attack (TIA), and cerebral infarction without residual deficits: Secondary | ICD-10-CM | POA: Diagnosis not present

## 2019-01-22 DIAGNOSIS — R55 Syncope and collapse: Secondary | ICD-10-CM | POA: Diagnosis not present

## 2019-01-22 DIAGNOSIS — D696 Thrombocytopenia, unspecified: Secondary | ICD-10-CM | POA: Diagnosis present

## 2019-01-22 DIAGNOSIS — Z79890 Hormone replacement therapy (postmenopausal): Secondary | ICD-10-CM | POA: Diagnosis not present

## 2019-01-22 DIAGNOSIS — N400 Enlarged prostate without lower urinary tract symptoms: Secondary | ICD-10-CM | POA: Diagnosis present

## 2019-01-22 DIAGNOSIS — R0789 Other chest pain: Secondary | ICD-10-CM | POA: Diagnosis present

## 2019-01-22 DIAGNOSIS — R739 Hyperglycemia, unspecified: Secondary | ICD-10-CM | POA: Diagnosis not present

## 2019-01-22 DIAGNOSIS — G4700 Insomnia, unspecified: Secondary | ICD-10-CM | POA: Diagnosis present

## 2019-01-22 DIAGNOSIS — I701 Atherosclerosis of renal artery: Secondary | ICD-10-CM | POA: Diagnosis present

## 2019-01-22 MED ORDER — SODIUM CHLORIDE 0.9 % IV SOLN
INTRAVENOUS | Status: DC
  Administered 2019-01-22 (×2): via INTRAVENOUS

## 2019-01-22 MED ORDER — POLYETHYLENE GLYCOL 3350 17 G PO PACK
17.0000 g | PACK | Freq: Every day | ORAL | Status: DC
  Administered 2019-01-22 – 2019-01-23 (×2): 17 g via ORAL
  Filled 2019-01-22 (×2): qty 1

## 2019-01-22 NOTE — Progress Notes (Signed)
PROGRESS NOTE    Victor Waters  GMW:[id number] DOB: [birth year] DOA: 01/20/2019 PCP: Darreld Mclean, MD   Brief Narrative: Patient is a 85 year old male with past medical history of TIA, seizure disorder, chronic systolic CHF, coronary disease status post CABG, prostate cancer, carotid stenosis, hypertension, hyperlipidemia who presented from home to the emergency department with complaints of dizziness and syncopal episode.  As per the wife, he fell 4 times in the last month.  He also had intermittent chest pain since last few days.  Orthostatic vitals checked and they  were positive.  Started on IV fluids.  Assessment & Plan:   Principal Problem:   Near syncope Active Problems:   Essential hypertension   Chronic systolic heart failure (HCC)   Prostate cancer (HCC)   Hyperglycemia  Syncopal episode/frequent falls: Syncopized during micturition in the bathroom. .  Orthostatic vitals checked , were positive so it could be associated with orthostatic hypotension.  Orthostatic vitals checked this morning are still positive.  We will continue gentle IV fluids. Patient  evaluated  by PT/OT and did not recommend follow up. If orthostatic blood pressures doesnot improve tomorrow, will consider him putting on midodrine.  Chest pain:Resolved now. Atypical chest pain.  Has been complaining of chest pain since last few days.  Pain is midsternal, sharp not associated with exertion.    EKG  did not show any ST changes, showed first-degree AV block.  His troponins have been negative.  Echocardiogram has been done which showed ejection fraction of 45 to 50%, mild global hypokinesis.EF has reduced from his last echo in 2018.  Mild systolic CHF: Currently euvolemic.  No edema.  He follows with Dr. Percival Spanish as an outpatient.  Also on Entresto at home.  History of coronary disease: Status post CABG.  Continue current medications.  Continue aspirin  Prostate cancer: Follows with urology.  Treated with  Lupron.  Needs to follow-up with urology on discharge.  Continue Flomax.  Hypertension: Currently blood pressure stable.  Continue current medications  Thrombocytopenia: Chronic.  Currently stable.        DVT prophylaxis:Lovenox Code Status: DNR Family Communication: Discussed with wife on the phone. Disposition Plan: Home tomorrow   Consultants: None  Procedures: Echocardiogram  Antimicrobials:  Anti-infectives (From admission, onward)   None      Subjective: Patient seen and examined at bedside this morning.  Remains comfortable.  No chest pain today.  Orthostatics found to positive this morning.  Objective: Vitals:   01/21/19 1139 01/21/19 1726 01/21/19 1945 01/22/19 0627  BP: (!) 155/73 (!) 143/88 (!) 156/81 (!) 164/92  Pulse: 90 71 98 96  Resp: 20 20 18 18   Temp: 98.4 F (36.9 C) 98.6 F (37 C) 98.8 F (37.1 C) 98.6 F (37 C)  TempSrc: Oral Oral Oral Oral  SpO2: 98% 99% 99%   Weight:    63.9 kg  Height:        Intake/Output Summary (Last 24 hours) at 01/22/2019 1053 Last data filed at 01/22/2019 0825 Gross per 24 hour  Intake 694.14 ml  Output 3325 ml  Net -2630.86 ml   Filed Weights   01/20/19 0745 01/21/19 0425 01/22/19 0627  Weight: 65.3 kg 65.4 kg 63.9 kg    Examination:  General exam: Appears calm and comfortable ,Not in distress,elderly male  HEENT:PERRL,Oral mucosa moist, Ear/Nose normal on gross exam Respiratory system: Bilateral equal air entry, normal vesicular breath sounds, no wheezes or crackles  Cardiovascular system: S1 & S2 heard, RRR. No  JVD, murmurs, rubs, gallops or clicks. Gastrointestinal system: Abdomen is nondistended, soft and nontender. No organomegaly or masses felt. Normal bowel sounds heard. Central nervous system: Alert and oriented. No focal neurological deficits. Extremities: No edema, no clubbing ,no cyanosis, distal peripheral pulses palpable. Skin: No rashes, lesions or ulcers,no icterus ,no pallor MSK: Normal  muscle bulk,tone ,power Psychiatry: Judgement and insight appear normal. Mood & affect appropriate.     Data Reviewed: I have personally reviewed following labs and imaging studies  CBC: Recent Labs  Lab 01/20/19 0742 01/21/19 0454  WBC 4.0 4.7  HGB 12.5* 13.1  HCT 37.9* 39.3  MCV 92.9 89.9  PLT 125* 102*   Basic Metabolic Panel: Recent Labs  Lab 01/20/19 0742 01/21/19 0454  NA 136 135  K 4.1 4.0  CL 105 102  CO2 23 23  GLUCOSE 150* 114*  BUN 23 20  CREATININE 1.03 1.02  CALCIUM 8.8* 9.2   GFR: Estimated Creatinine Clearance: 40.3 mL/min (by C-G formula based on SCr of 1.02 mg/dL). Liver Function Tests: No results for input(s): AST, ALT, ALKPHOS, BILITOT, PROT, ALBUMIN in the last 168 hours. No results for input(s): LIPASE, AMYLASE in the last 168 hours. No results for input(s): AMMONIA in the last 168 hours. Coagulation Profile: No results for input(s): INR, PROTIME in the last 168 hours. Cardiac Enzymes: Recent Labs  Lab 01/20/19 1302 01/20/19 1931 01/21/19 0454 01/21/19 1345 01/21/19 2044  TROPONINI <0.03 <0.03 <0.03 <0.03 <0.03   BNP (last 3 results) No results for input(s): PROBNP in the last 8760 hours. HbA1C: No results for input(s): HGBA1C in the last 72 hours. CBG: No results for input(s): GLUCAP in the last 168 hours. Lipid Profile: No results for input(s): CHOL, HDL, LDLCALC, TRIG, CHOLHDL, LDLDIRECT in the last 72 hours. Thyroid Function Tests: Recent Labs    01/20/19 1302  TSH 2.258   Anemia Panel: Recent Labs    01/21/19 1345  VITAMINB12 435   Sepsis Labs: No results for input(s): PROCALCITON, LATICACIDVEN in the last 168 hours.  No results found for this or any previous visit (from the past 240 hour(s)).       Radiology Studies: No results found.      Scheduled Meds: . aspirin EC  81 mg Oral QHS  . enoxaparin (LOVENOX) injection  40 mg Subcutaneous Q24H  . ezetimibe  5 mg Oral Daily  . sacubitril-valsartan  1  tablet Oral BID  . sodium chloride flush  3 mL Intravenous Q12H   Continuous Infusions: . sodium chloride       LOS: 0 days    Time spent: 35 mins.More than 50% of that time was spent in counseling and/or coordination of care.      Shelly Coss, MD [facility] Pager [phone]  If 7PM-7AM, please contact night-coverage [email or web address] Password [facility] 01/22/2019, 10:53 AM

## 2019-01-23 NOTE — Care Management Note (Signed)
Case Management Note  Patient Details  Name: Victor Waters MRN: [id number] Date of Birth: May 25, 1926  Subjective/Objective:  Near Syncope               Action/Plan: Patient lives at home with spouse; PCP: Copland, Gay Filler, MD; has private insurance with [facility] Medicare with prescription drug coverage; pharmacy of choice is [facility] on [address]; [place] choice offered, pt /spouse chose [facility]; Dan with [facility] called for arrangements; pt also requested a rolling walker and 3:1 for home; DME ordered and to be delivered to the room today prior to discharge home.  Expected Discharge Date:  01/23/19               Expected Discharge Plan:  [place]  Discharge planning Services  CM Consult  HH Arranged:  RN, PT [facility] Agency:  [place]  Status of Service:  In process, will continue to follow  Sherrilyn Rist [phone] 01/23/2019, 10:34 AM

## 2019-01-23 NOTE — Plan of Care (Signed)
  Problem: Education: Goal: Knowledge of General Education information will improve Description Including pain rating scale, medication(s)/side effects and non-pharmacologic comfort measures Outcome: Adequate for Discharge   Problem: Health Behavior/Discharge Planning: Goal: Ability to manage health-related needs will improve Outcome: Adequate for Discharge   

## 2019-01-23 NOTE — Progress Notes (Signed)
Patient ready for discharge. 

## 2019-01-23 NOTE — Progress Notes (Signed)
Physical Therapy Treatment Patient Details Name: Victor Waters MRN: [id number] DOB: 03/31/1926 Today's Date: 01/23/2019    History of Present Illness Pt is a 85yo male s/p chest pain and fall. Pt adminstered NTG at home.  CT C spine was negative for acute. CT head showed mild diffused cortical atrophy but no acute abnormalities. Pt with headache. PMHx: TIA. seizure, heart failure, CAD s/p CABG, HLD, HTN, seizure.    PT Comments    Patient progressing well towards PT goals. Continues to be orthostatic with complaints of mild dizziness towards end of ambulation. Discussed safety and fall reduction with regards to changing positions slowly and waiting prior to mobility. Tolerated stair training with Min A for balance/safety. Posterior LOB x2 but able to use rail to catch self. HR <135 bpm during session. Wants to start exercise program and walking on track at the [facility]. Recommend HHPT for safety evaluation as well as to improve overall strength, mobility and safety. Will follow.    Follow Up Recommendations  Home health PT;Supervision - Intermittent     Equipment Recommendations  None recommended by PT    Recommendations for Other Services       Precautions / Restrictions Precautions Precautions: Fall Precaution Comments: orthostasis Restrictions Weight Bearing Restrictions: No    Mobility  Bed Mobility Overal bed mobility: Modified Independent             General bed mobility comments: Use of rail and increased time to perform  Transfers Overall transfer level: Needs assistance Equipment used: Rolling walker (2 wheeled) Transfers: Sit to/from Stand Sit to Stand: Supervision         General transfer comment: Supervision for safety. Stood from [employer], transferred to chair post ambulation.  Ambulation/Gait Ambulation/Gait assistance: Min guard Gait Distance (Feet): 350 Feet Assistive device: Rolling walker (2 wheeled) Gait Pattern/deviations: Step-through  pattern;Trunk flexed     General Gait Details: Decent gait speed with increased trunk flexion throughout. Cues for RW proximity and upright. pt with sudden back pain post walking, says it happens at home sometimes and usually he massages it and it gets better   Stairs Stairs: Yes Stairs assistance: Min assist Stair Management: One rail Left;Two rails;Step to pattern;Alternating pattern Number of Stairs: 4(x2 bouts) General stair comments: Cues for hand placement/safety. BUEs on rail to ascend and 1 UE on rail and HHA to descend with posterior LOB x2. Initially step to pattern to ascend progressing to alternating on second attempt   Wheelchair Mobility    Modified Rankin (Stroke Patients Only)       Balance Overall balance assessment: Mild deficits observed, not formally tested                                          Cognition Arousal/Alertness: Awake/alert Behavior During Therapy: WFL for tasks assessed/performed Overall Cognitive Status: No family/caregiver present to determine baseline cognitive functioning                                 General Comments: Likely at cognitive baseline. Has decreased short term memory and has difficulty following multi step commands.      Exercises      General Comments General comments (skin integrity, edema, etc.): BP pre activity 135/78, post activity 122/77- reports feeling minimally dizzy. HR <130s bpm.  Pertinent Vitals/Pain Pain Assessment: Faces Faces Pain Scale: Hurts even more Pain Location: back towards end of ambulation Pain Descriptors / Indicators: Sharp;Spasm Pain Intervention(s): Monitored during session;Other (comment)(MD and RN present and aware)    Home Living                      Prior Function            PT Goals (current goals can now be found in the care plan section) Progress towards PT goals: Progressing toward goals    Frequency    Min 3X/week       PT Plan Current plan remains appropriate;Discharge plan needs to be updated    Co-evaluation              AM-PAC PT "6 Clicks" Mobility   Outcome Measure  Help needed turning from your back to your side while in a flat bed without using bedrails?: None Help needed moving from lying on your back to sitting on the side of a flat bed without using bedrails?: A Little Help needed moving to and from a bed to a chair (including a wheelchair)?: A Little Help needed standing up from a chair using your arms (e.g., wheelchair or bedside chair)?: A Little Help needed to walk in hospital room?: A Little Help needed climbing 3-5 steps with a railing? : A Little 6 Click Score: 19    End of Session Equipment Utilized During Treatment: Gait belt Activity Tolerance: Patient tolerated treatment well Patient left: in chair;with call bell/phone within reach;with chair alarm set;with nursing/sitter in room Nurse Communication: Mobility status PT Visit Diagnosis: Unsteadiness on feet (R26.81);Pain Pain - part of body: (back)     Time: 0732-0806 PT Time Calculation (min) (ACUTE ONLY): 34 min  Charges:  $Gait Training: 23-37 mins                     Wray Kearns, PT, DPT Acute Rehabilitation Services Pager [phone] Office [place] 01/23/2019, 8:33 AM

## 2019-01-23 NOTE — Discharge Summary (Signed)
Physician Discharge Summary  Victor Waters [id number]:[id number] DOB: Apr 02, 1926 DOA: 01/20/2019  PCP: Darreld Mclean, MD  Admit date: 01/20/2019 Discharge date: 01/23/2019  Admitted From: Home Disposition:  Home  Discharge Condition:Stable CODE STATUS:DNR Diet recommendation: Heart Healthy  Brief/Interim Summary:  Patient is a 85 year old male with past medical history of TIA, seizure disorder, chronic systolic CHF, coronary disease status post CABG, prostate cancer, carotid stenosis, hypertension, hyperlipidemia who presented from home to the emergency department with complaints of dizziness and syncopal episode.  As per the wife, he fell 4 times in the last month.  He also had intermittent chest pain since last few days.His chest pain resolved during this hospitalization.  His chest pain was atypical.  Troponins were negative.  EKG did not show any ST changes.  Orthostatic vitals checked and they  were positive.  Started on IV fluids.  He was still mildly orthostatic this morning.  Blood pressure overall is stable.  He is being discharged to home with home health today.  He has been instructed to be careful while getting up from lying or sitting position.  TED hose will be  recommended.  Tamsulosin will be stopped.  Following problems were addressed during his hospitalization:  Syncopal episode/frequent falls: Syncopized during micturition in the bathroom. .  Orthostatic vitals checked , were positive so it could be associated with orthostatic hypotension.  Orthostatic vitals checked this morning are still mildly positive. He was given enough fluids for last 2 days. Patient  evaluated  by PT/OT and commended home health. He was on tamsulosin at home which will be discontinued.  Application of TED hose encouraged.  Chest pain:Resolved now. Atypical chest pain.  Has been complaining of chest pain since last few days.  Pain is midsternal, sharp not associated with exertion.    EKG  did not show any  ST changes, showed first-degree AV block.  His troponins have been negative.  Echocardiogram has been done which showed ejection fraction of 45 to 50%, mild global hypokinesis.EF has reduced from his last echo in 2018.Follow up with his cardiologist as an outpatient.  Mild systolic CHF: Currently euvolemic.  No edema.  He follows with Dr. Percival Spanish as an outpatient.  Also on Entresto at home.  History of coronary disease: Status post CABG.  Continue current medications.  Continue aspirin  Prostate cancer: Follows with urology.  Treated with Lupron.  Needs to follow-up with urology .  On Flomax at home will be discontinued due to orthostatic hypotension.  Hypertension: Currently blood pressure stable.  Continue current medications  Thrombocytopenia: Chronic.  Currently stable.    Discharge Diagnoses:  Principal Problem:   Near syncope Active Problems:   Essential hypertension   Chronic systolic heart failure (HCC)   Prostate cancer (HCC)   Hyperglycemia    Discharge Instructions  Discharge Instructions    Diet - low sodium heart healthy   Complete by:  As directed    Discharge instructions   Complete by:  As directed    1)Please follow up with PCP in a week. 2)Be careful while getting up from sitting or lying position.  You have a high risk for falls.  Always walk with the supervision of somebody.Apply TED hose.   Increase activity slowly   Complete by:  As directed      Allergies as of 01/23/2019      Reactions   Lisinopril Cough   Renal artery stenosis by history; ACE inhibitor  would be relatively contraindicated  Zithromax [azithromycin]    02/15/14 N&V   Niacin Other (See Comments)   Burning sensations in head      Medication List    STOP taking these medications   tamsulosin 0.4 MG Caps capsule Commonly known as:  FLOMAX     TAKE these medications   aspirin 81 MG tablet Take 81 mg by mouth at bedtime.   CALCIUM-MAGNESIUM-ZINC PO Take 1 tablet by mouth  daily.   cholecalciferol 25 MCG (1000 UT) tablet Commonly known as:  VITAMIN D Take 2,000 Units by mouth daily.   clotrimazole 1 % cream Commonly known as:  LOTRIMIN Apply 1 application topically 2 (two) times daily. Use for about 2 weeks What changed:    when to take this  reasons to take this   ENTRESTO 49-51 MG Generic drug:  sacubitril-valsartan TAKE 1 TABLET BY MOUTH TWICE A DAY   ezetimibe 10 MG tablet Commonly known as:  ZETIA take 1/2 tablet by mouth once daily   Fish Oil 1200 MG Cpdr Take 1 capsule by mouth daily.   Melatonin 3 MG Tbdp Take 1 tablet by mouth at bedtime as needed (sleep). Reported on 04/29/2016   nitroGLYCERIN 0.4 MG SL tablet Commonly known as:  NITROSTAT DISSOLVE 1 TAB UNDER TONGUE AS NEEDED FOR CHEST PAIN, MAY REPEAT IN 5 MINUTES AS DIRECTED. ER IF NO BETTER.   vitamin C 500 MG tablet Commonly known as:  ASCORBIC ACID Take 500 mg by mouth daily.      Follow-up Information    Copland, Gay Filler, MD. Schedule an appointment as soon as possible for a visit in 1 week(s).   Specialty:  Family Medicine Contact information: [address] [place] [address] [phone]          Allergies  Allergen Reactions  . Lisinopril Cough    Renal artery stenosis by history; ACE inhibitor  would be relatively contraindicated  . Zithromax [Azithromycin]     02/15/14 N&V  . Niacin Other (See Comments)    Burning sensations in head    Consultations:  None   Procedures/Studies: Dg Chest 2 View  Result Date: 01/20/2019 CLINICAL DATA:  Chest pain after fall. EXAM: CHEST - 2 VIEW COMPARISON:  Radiographs of August 31, 2016. FINDINGS: The heart size and mediastinal contours are within normal limits. Atherosclerosis of thoracic aorta is noted. No pneumothorax or pleural effusion is noted. Status post coronary artery bypass graft. Both lungs are clear. The visualized skeletal structures are unremarkable. IMPRESSION: No active  cardiopulmonary disease. Aortic Atherosclerosis (ICD10-I70.0). Electronically Signed   By: Marijo Conception, M.D.   On: 01/20/2019 09:34   Ct Head Wo Contrast  Result Date: 01/20/2019 CLINICAL DATA:  Posttraumatic headache after fall. EXAM: CT HEAD WITHOUT CONTRAST TECHNIQUE: Contiguous axial images were obtained from the base of the skull through the vertex without intravenous contrast. COMPARISON:  CT scan of April 04, 2017. FINDINGS: Brain: Mild diffuse cortical atrophy is noted. Mild chronic ischemic white matter disease is noted. No mass effect or midline shift is noted. Ventricular size is within normal limits. There is no evidence of mass lesion, hemorrhage or acute infarction. Vascular: No hyperdense vessel or unexpected calcification. Skull: Normal. Negative for fracture or focal lesion. Sinuses/Orbits: No acute finding. Other: None. IMPRESSION: Mild diffuse cortical atrophy. Mild chronic ischemic white matter disease. No acute intracranial abnormality seen. Electronically Signed   By: Marijo Conception, M.D.   On: 01/20/2019 09:31  Subjective: Patient seen and examined at bedside this morning.  Remains comfortable.  Hemodynamically stable for discharge.  Discharge Exam: Vitals:   01/23/19 0502 01/23/19 0842  BP: (!) 157/83 128/77  Pulse: 95 (!) 109  Resp: 18 18  Temp: 98 F (36.7 C) 98.3 F (36.8 C)  SpO2: 94% 99%   Vitals:   01/22/19 1955 01/22/19 2115 01/23/19 0502 01/23/19 0842  BP: (!) 156/91 (!) 169/86 (!) 157/83 128/77  Pulse: 95 97 95 (!) 109  Resp: 18  18 18   Temp: 98 F (36.7 C)  98 F (36.7 C) 98.3 F (36.8 C)  TempSrc: Oral  Oral Oral  SpO2: 98%  94% 99%  Weight:   64.9 kg   Height:        General: Pt is alert, awake, not in acute distress Cardiovascular: RRR, S1/S2 +, no rubs, no gallops Respiratory: CTA bilaterally, no wheezing, no rhonchi Abdominal: Soft, NT, ND, bowel sounds + Extremities: no edema, no cyanosis    The results of significant  diagnostics from this hospitalization (including imaging, microbiology, ancillary and laboratory) are listed below for reference.     Microbiology: No results found for this or any previous visit (from the past 240 hour(s)).   Labs: BNP (last 3 results) No results for input(s): BNP in the last 8760 hours. Basic Metabolic Panel: Recent Labs  Lab 01/20/19 0742 01/21/19 0454  NA 136 135  K 4.1 4.0  CL 105 102  CO2 23 23  GLUCOSE 150* 114*  BUN 23 20  CREATININE 1.03 1.02  CALCIUM 8.8* 9.2   Liver Function Tests: No results for input(s): AST, ALT, ALKPHOS, BILITOT, PROT, ALBUMIN in the last 168 hours. No results for input(s): LIPASE, AMYLASE in the last 168 hours. No results for input(s): AMMONIA in the last 168 hours. CBC: Recent Labs  Lab 01/20/19 0742 01/21/19 0454  WBC 4.0 4.7  HGB 12.5* 13.1  HCT 37.9* 39.3  MCV 92.9 89.9  PLT 125* 131*   Cardiac Enzymes: Recent Labs  Lab 01/20/19 1302 01/20/19 1931 01/21/19 0454 01/21/19 1345 01/21/19 2044  TROPONINI <0.03 <0.03 <0.03 <0.03 <0.03   BNP: Invalid input(s): POCBNP CBG: No results for input(s): GLUCAP in the last 168 hours. D-Dimer No results for input(s): DDIMER in the last 72 hours. Hgb A1c No results for input(s): HGBA1C in the last 72 hours. Lipid Profile No results for input(s): CHOL, HDL, LDLCALC, TRIG, CHOLHDL, LDLDIRECT in the last 72 hours. Thyroid function studies Recent Labs    01/20/19 1302  TSH 2.258   Anemia work up Recent Labs    01/21/19 1345  VITAMINB12 435   Urinalysis    Component Value Date/Time   COLORURINE YELLOW 01/20/2019 [place] 01/20/2019 0937   LABSPEC 1.008 01/20/2019 0937   PHURINE 6.0 01/20/2019 0937   GLUCOSEU NEGATIVE 01/20/2019 0937   GLUCOSEU NEGATIVE 03/29/2017 1404   HGBUR NEGATIVE 01/20/2019 0937   HGBUR negative 07/06/2010 1402   BILIRUBINUR NEGATIVE 01/20/2019 0937   BILIRUBINUR negative 05/29/2018 1252   KETONESUR NEGATIVE  01/20/2019 0937   PROTEINUR NEGATIVE 01/20/2019 0937   UROBILINOGEN 0.2 05/29/2018 1252   UROBILINOGEN 0.2 03/29/2017 1404   NITRITE NEGATIVE 01/20/2019 0937   LEUKOCYTESUR NEGATIVE 01/20/2019 0937   Sepsis Labs Invalid input(s): PROCALCITONIN,  WBC,  LACTICIDVEN Microbiology No results found for this or any previous visit (from the past 240 hour(s)).  Please note: You were cared for by a hospitalist during your hospital stay. Once you are  discharged, your primary care physician will handle any further medical issues. Please note that NO REFILLS for any discharge medications will be authorized once you are discharged, as it is imperative that you return to your primary care physician (or establish a relationship with a primary care physician if you do not have one) for your post hospital discharge needs so that they can reassess your need for medications and monitor your lab values.    Time coordinating discharge: 40 minutes  SIGNED:   Shelly Coss, MD  [facility] 01/23/2019, 9:59 AM Pager [phone]  If 7PM-7AM, please contact night-coverage [email or web address] Password [email or web address]

## 2019-01-24 NOTE — Consult Note (Signed)
            [facility] CM Primary Care Navigator  01/24/2019  Victor Waters 1926/04/10 [id number]   Attempt to seepatient at the bedside to identify possible discharge needs buthe wasalready dischargedper staff. Patient went home with home health services.  Per MD note, patient presented with complaints of dizziness, syncopal episode and intermittent chest pain. (syncopal episode/ frequent falls) He has been instructed to be careful while getting up from lying or sitting position, always walk with the supervision of somebody and apply TED hose.  Patient has discharge instruction to follow-up withprimary care providerwithin a week  Primary care provider's office is listed as providing transition of care (TOC) follow-up.   For additional questions please contact:  Edwena Felty A. Denitra Donaghey, BSN, RN-BC [facility] PRIMARY CARE Navigator Cell: [phone]

## 2019-01-25 ENCOUNTER — Telehealth: Payer: Self-pay | Admitting: *Deleted

## 2019-01-25 ENCOUNTER — Telehealth: Payer: Self-pay | Admitting: Family Medicine

## 2019-01-25 DIAGNOSIS — I110 Hypertensive heart disease with heart failure: Secondary | ICD-10-CM | POA: Diagnosis not present

## 2019-01-25 DIAGNOSIS — Z7982 Long term (current) use of aspirin: Secondary | ICD-10-CM | POA: Diagnosis not present

## 2019-01-25 DIAGNOSIS — I5022 Chronic systolic (congestive) heart failure: Secondary | ICD-10-CM | POA: Diagnosis not present

## 2019-01-25 DIAGNOSIS — Z951 Presence of aortocoronary bypass graft: Secondary | ICD-10-CM | POA: Diagnosis not present

## 2019-01-25 DIAGNOSIS — I951 Orthostatic hypotension: Secondary | ICD-10-CM | POA: Diagnosis not present

## 2019-01-25 DIAGNOSIS — I2510 Atherosclerotic heart disease of native coronary artery without angina pectoris: Secondary | ICD-10-CM | POA: Diagnosis not present

## 2019-01-25 NOTE — Telephone Encounter (Signed)
  Pt answered phone, but asked that I do the call with his wife stating "she knows better than I do".  Transition Care Management Follow-up Telephone Call   Date discharged? 01/23/2019   How have you been since you were released from the hospital? "he is doing fine"   Do you understand why you were in the hospital? yes   Do you understand the discharge instructions? yes   Where were you discharged to? Home with wife   Items Reviewed:  Medications reviewed: yes, they only took him off of his tamsulosin  Allergies reviewed: yes  Dietary changes reviewed: yes  Referrals reviewed: yes   Functional Questionnaire:   Activities of Daily Living (ADLs):   He states they are independent in the following: ambulation, bathing and hygiene, feeding, continence, grooming, toileting and dressing.  Wife reports he uses rolling walker and only doing sponge baths States they require assistance with the following: she will discuss with PCP at appt. unsure   Any transportation issues/concerns?: no   Any patient concerns? no   Confirmed importance and date/time of follow-up visits scheduled yes  Provider Appointment booked with PCP 01/31/2019 @150   Confirmed with patient if condition begins to worsen call PCP or go to the ER.  Patient was given the office number and encouraged to call back with question or concerns.  : yes

## 2019-01-25 NOTE — Telephone Encounter (Signed)
Verbal orders given  

## 2019-01-25 NOTE — Telephone Encounter (Signed)
Copied from [place] [id number]. Topic: Quick Communication - Home Health Verbal Orders >> Jan 25, 2019 12:55 PM Leward Quan A wrote: Caller/Agency: [facility] Number: [phone] ok to LM  Requesting OT/PT/Skilled Nursing/Social Work: Nursing services Plan of Care Frequency: 1 wk 4

## 2019-01-26 ENCOUNTER — Ambulatory Visit: Payer: Self-pay | Admitting: *Deleted

## 2019-01-26 NOTE — Telephone Encounter (Signed)
Pt's wife calling with pt present with complaints of swelling to bilateral legs and ankles and a weight gain of approximately 4 pounds since yesterday. Pt's wife states that the pt has not experienced this before but does have a history of HF. Pt denies any SOB,, chest pain, leg pain, fever or other symptoms at this time.  Pt recently discharged from the hospital on Tuesday of this week. Pt scheduled for appt at [facility] at 10 am. Pt's wife advised that if the pt becomes worse during the night to take the pt to the ED. Understanding verbalized.   Reason for Disposition . [1] MODERATE leg swelling (e.g., swelling extends up to knees) AND [2] new onset or worsening  Answer Assessment - Initial Assessment Questions 1. LOCATION: "Which joint is swollen?"     Both ankles  2. ONSET: "When did the swelling start?"     Noticed today 3. SIZE: "How large is the swelling?"     Legs are big so it is hard to tell 4. PAIN: "Is there any pain?" If so, ask: "How bad is it?" (Scale 1-10; or mild, moderate, severe)     Denies any pain 5. CAUSE: "What do you think caused the swollen joint?"     unsure 6. OTHER SYMPTOMS: "Do you have any other symptoms?" (e.g., fever, chest pain, difficulty breathing, calf pain)  No  Answer Assessment - Initial Assessment Questions 1. ONSET: "When did the swelling start?" (e.g., minutes, hours, days)     Noticed on yesterday 2. LOCATION: "What part of the leg is swollen?"  "Are both legs swollen or just one leg?"     Bilateral legs and ankles  3. SEVERITY: "How bad is the swelling?" (e.g., localized; mild, moderate, severe)  - Localized - small area of swelling localized to one leg  - MILD pedal edema - swelling limited to foot and ankle, pitting edema < 1/4 inch (6 mm) deep, rest and elevation eliminate most or all swelling  - MODERATE edema - swelling of lower leg to knee, pitting edema > 1/4 inch (6 mm) deep, rest and elevation only partially reduce swelling  -  SEVERE edema - swelling extends above knee, facial or hand swelling present      Moderate, pt's wife states that it is hard to tell how swollen his legs are because the pt has big legs at baseline. Pt's wife states that the pt's legs feel tight up to his knees but it does not go up to his thighs 4. REDNESS: "Does the swelling look red or infected?"     no 5. PAIN: "Is the swelling painful to touch?" If so, ask: "How painful is it?"   (Scale 1-10; mild, moderate or severe)     Denies any pain 6. FEVER: "Do you have a fever?" If so, ask: "What is it, how was it measured, and when did it start?"      No 7. CAUSE: "What do you think is causing the leg swelling?"     Unknown 8. MEDICAL HISTORY: "Do you have a history of heart failure, kidney disease, liver failure, or cancer?"     Hx of HF 9. RECURRENT SYMPTOM: "Have you had leg swelling before?" If so, ask: "When was the last time?" "What happened that time?"     No 10. OTHER SYMPTOMS: "Do you have any other symptoms?" (e.g., chest pain, difficulty breathing)       No  Protocols used: LEG SWELLING AND EDEMA-A-AH, ANKLE SWELLING-A-AH

## 2019-01-27 ENCOUNTER — Ambulatory Visit: Payer: Medicare HMO | Admitting: Family Medicine

## 2019-01-28 NOTE — Progress Notes (Deleted)
[place] at [facility] [address], [place], [place] [address] [phone]  Date:  01/31/2019   Name:  UZOMA VIVONA   DOB:  Sep 03, 1926   MRN:  [id number]  PCP:  Darreld Mclean, MD    Chief Complaint: No chief complaint on file.   History of Present Illness:  FARREN NELLES is a 85 y.o. very pleasant male patient who presents with the following:  Here today for hospital follow-up visit He was admitted from 2/1 to 2/4, summary follows  CODE STATUS:DNR Diet recommendation: Heart Healthy Brief/Interim Summary:  Patient is a 85 year old male with past medical history of TIA, seizure disorder, chronic systolic CHF, coronary disease status post CABG, prostate cancer, carotid stenosis, hypertension, hyperlipidemia who presented from home to the emergency department with complaints of dizziness and syncopal episode.As per the wife, he fell 4 times in the lastmonth. He also had intermittent chest pain since last few days.His chest pain resolved during this hospitalization.  His chest pain was atypical.  Troponins were negative.  EKG did not show any ST changes.  Orthostatic vitals checked and theywere positive. Started on IV fluids.  He was still mildly orthostatic this morning.  Blood pressure overall is stable.  He is being discharged to home with home health today.  He has been instructed to be careful while getting up from lying or sitting position.  TED hose will be  recommended.  Tamsulosin will be stopped.  Following problems were addressed during his hospitalization: Syncopal episode/frequent falls:Syncopized during micturition in the bathroom. . Orthostatic vitals checked ,were positive so it could be associated with orthostatic hypotension. Orthostatic vitals checked this morning are still mildly positive. He was given enough fluids for last 2 days. Patient evaluated by PT/OT and commended home health. He was on  tamsulosin at home which will be discontinued.  Application of TED hose encouraged. Chest pain:Resolved now.Atypical chest pain. Has been complaining of chest pain since last few days. Pain is midsternal, sharp not associated with exertion. EKG did not show any ST changes, showed first-degree AV block. His troponins have been negative. Echocardiogram has been done which showed ejection fraction of 45 to 50%, mild global hypokinesis.EF hasreduced from his last echo in 2018.Follow up with his cardiologist as an outpatient. Mild systolic PJK:DTOIZTIWP euvolemic. No edema. He follows with Dr. Percival Spanish as an outpatient. Also on Entresto at home. History of coronary disease: Status post CABG. Continue current medications. Continue aspirin Prostate cancer: Follows with urology. Treated with Lupron. Needs to follow-up with urology . On Flomax at home will be discontinued due to orthostatic hypotension. Hypertension:Currently blood pressure stable. Continue current medications Thrombocytopenia: Chronic. Currently stable.   Discharge Diagnoses:  Principal Problem:   Near syncope Active Problems:   Essential hypertension   Chronic systolic heart failure (HCC)   Prostate cancer (HCC)   Hyperglycemia     Patient Active Problem List   Diagnosis Date Noted  . Near syncope 01/20/2019  . Prostate cancer ([place]) 01/20/2019  . Hyperglycemia 01/20/2019  . TIA (transient ischemic attack) 04/04/2017  . Facial droop 04/04/2017  . Localized swelling of lower extremity 04/04/2017  . BPH (benign prostatic hyperplasia) 04/04/2017  . Stroke-like symptoms 04/04/2017  . Syncope and collapse 08/31/2016  . Chronic systolic heart failure ([place])   . Cerebral infarction, remote, resolved   . Mild dementia ([place]) 08/10/2016  . Sleep disturbance 01/06/2016  . Ventricular ectopy 12/31/2014  . Acute confusional  state 12/31/2014  . Palpitations   . Nocturia 09/20/2013  . Hyponatremia 09/29/2012   . Fatigue 05/09/2012  . PERSONAL HISTORY OTHER DISORDER URINARY SYSTEM 12/22/2010  . Thrombocytopenia ([place]) 08/04/2010  . LEUKOCYTOPENIA UNSPECIFIED 08/04/2010  . TINNITUS, CHRONIC 07/06/2010  . RENAL ARTERY STENOSIS 11/25/2009  . RENAL INSUFFICIENCY 11/25/2009  . DYSPNEA 09/24/2009  . BRADYCARDIA 09/04/2009  . HYPOTENSION-ORTHOSTATIC 09/04/2009  . DIZZINESS 09/04/2009  . HYPERLIPIDEMIA 01/31/2008  . DEPRESSIVE DISORDER 01/23/2008  . Essential hypertension 01/23/2008  . ANXIETY 06/02/2007  . Coronary atherosclerosis 06/02/2007  . Insomnia 06/02/2007    Past Medical History:  Diagnosis Date  . Anxiety   . Coronary artery disease    a. s/p CABG;  b. LHC (2/11):  LM 30-40, pLAD 95-99 then 80 and 90, mCFX 90, pRCA occluded, S-dRCA ok, S-D1 ok, S-OM patent with 50, L-LAD ok.  Med Rx recommended.  c.  Myoview (5/13):  Low risk with small fixed inf defect c/w scar, no ischemia, EF 48%.  d.  Echo (6/13):  EF 55-60%, inf HK, Gr 1 DD, MAC;  e. Lexiscan Myoview (10/14):  Low risk, EF 38%, inferobasal infarct, no ischemia.  . Fatigue   . Hx of echocardiogram    Echo (10/14):  EF 55-60%, Gr 1DD, MAC, mild MR, mild LAE.  Marland Kitchen Hyperlipidemia   . Hypertension   . Hyponatremia 09/2012   Associated with postural hypotension and confusion  . Insomnia   . Myocardial infarction ([place]) 2003   hx of   . Renal artery stenosis (HCC)    bilateral;  s/p R RA stent 2011  . Seizure ([place]) 2015   unknow etiology and year of seizure???  . Syncope and collapse 08/31/2016  . Systolic heart failure (HCC)    CHRONIC  . TIA (transient ischemic attack)     Past Surgical History:  Procedure Laterality Date  . CARDIAC CATHETERIZATION  2003   revdeling severe three-vessel disease  . CORONARY ARTERY BYPASS GRAFT     LIMA to LAD, SVG to diagonal, SVG to RCA, and SVG to circumflex  . POLYPECTOMY     colon  . RENAL ARTERY STENT  01-28-10    Social History   Tobacco Use  . Smoking status: Never Smoker  .  Smokeless tobacco: Never Used  Substance Use Topics  . Alcohol use: No  . Drug use: No    Family History  Problem Relation Age of Onset  . Diabetes Mother   . Stroke Mother   . Heart attack Father 75       died  . Colon cancer Other        paternal grandfather    Allergies  Allergen Reactions  . Lisinopril Cough    Renal artery stenosis by history; ACE inhibitor  would be relatively contraindicated  . Zithromax [Azithromycin]     02/15/14 N&V  . Niacin Other (See Comments)    Burning sensations in head    Medication list has been reviewed and updated.  Current Outpatient Medications on File Prior to Visit  Medication Sig Dispense Refill  . aspirin 81 MG tablet Take 81 mg by mouth at bedtime.     Marland Kitchen CALCIUM-MAGNESIUM-ZINC PO Take 1 tablet by mouth daily.    . cholecalciferol (VITAMIN D) 1000 UNITS tablet Take 2,000 Units by mouth daily.    . clotrimazole (LOTRIMIN) 1 % cream Apply 1 application topically 2 (two) times daily. Use for about 2 weeks (Patient taking differently: Apply 1 application topically 2 (two)  times daily as needed (rash). Use for about 2 weeks) 30 g 0  . ENTRESTO 49-51 MG TAKE 1 TABLET BY MOUTH TWICE A DAY 60 tablet 5  . ezetimibe (ZETIA) 10 MG tablet take 1/2 tablet by mouth once daily 45 tablet 3  . Melatonin 3 MG TBDP Take 1 tablet by mouth at bedtime as needed (sleep). Reported on 04/29/2016    . nitroGLYCERIN (NITROSTAT) 0.4 MG SL tablet DISSOLVE 1 TAB UNDER TONGUE AS NEEDED FOR CHEST PAIN, MAY REPEAT IN 5 MINUTES AS DIRECTED. ER IF NO BETTER. 25 tablet 2  . Omega-3 Fatty Acids (FISH OIL) 1200 MG CPDR Take 1 capsule by mouth daily.    . vitamin C (ASCORBIC ACID) 500 MG tablet Take 500 mg by mouth daily.     No current facility-administered medications on file prior to visit.     Review of Systems:  As per HPI- otherwise negative.   Physical Examination: There were no vitals filed for this visit. There were no vitals filed for this visit. There  is no height or weight on file to calculate BMI. Ideal Body Weight:    GEN: WDWN, NAD, Non-toxic, A & O x 3 HEENT: Atraumatic, Normocephalic. Neck supple. No masses, No LAD. Ears and Nose: No external deformity. CV: RRR, No M/G/R. No JVD. No thrill. No extra heart sounds. PULM: CTA B, no wheezes, crackles, rhonchi. No retractions. No resp. distress. No accessory muscle use. ABD: S, NT, ND, +BS. No rebound. No HSM. EXTR: No c/c/e NEURO Normal gait.  PSYCH: Normally interactive. Conversant. Not depressed or anxious appearing.  Calm demeanor.    Assessment and Plan: ***  Signed Lamar Blinks, MD

## 2019-01-29 ENCOUNTER — Telehealth: Payer: Self-pay | Admitting: Family Medicine

## 2019-01-29 DIAGNOSIS — I5022 Chronic systolic (congestive) heart failure: Secondary | ICD-10-CM | POA: Diagnosis not present

## 2019-01-29 DIAGNOSIS — I2510 Atherosclerotic heart disease of native coronary artery without angina pectoris: Secondary | ICD-10-CM | POA: Diagnosis not present

## 2019-01-29 DIAGNOSIS — Z951 Presence of aortocoronary bypass graft: Secondary | ICD-10-CM | POA: Diagnosis not present

## 2019-01-29 DIAGNOSIS — I951 Orthostatic hypotension: Secondary | ICD-10-CM | POA: Diagnosis not present

## 2019-01-29 DIAGNOSIS — Z7982 Long term (current) use of aspirin: Secondary | ICD-10-CM | POA: Diagnosis not present

## 2019-01-29 DIAGNOSIS — I110 Hypertensive heart disease with heart failure: Secondary | ICD-10-CM | POA: Diagnosis not present

## 2019-01-29 NOTE — Telephone Encounter (Signed)
Verbal orders given detailed voicemail.

## 2019-01-29 NOTE — Telephone Encounter (Signed)
Copied from [place] [id number]. Topic: General - Other >> Jan 29, 2019  1:18 PM Yvette Rack wrote: Reason for CRM: [facility] calling for verbal orders for PT  2 x 2 and once a week for 2 weeks

## 2019-01-31 ENCOUNTER — Inpatient Hospital Stay: Payer: Medicare HMO | Admitting: Family Medicine

## 2019-01-31 DIAGNOSIS — I5022 Chronic systolic (congestive) heart failure: Secondary | ICD-10-CM | POA: Diagnosis not present

## 2019-01-31 DIAGNOSIS — Z7982 Long term (current) use of aspirin: Secondary | ICD-10-CM | POA: Diagnosis not present

## 2019-01-31 DIAGNOSIS — I2510 Atherosclerotic heart disease of native coronary artery without angina pectoris: Secondary | ICD-10-CM | POA: Diagnosis not present

## 2019-01-31 DIAGNOSIS — I951 Orthostatic hypotension: Secondary | ICD-10-CM | POA: Diagnosis not present

## 2019-01-31 DIAGNOSIS — Z951 Presence of aortocoronary bypass graft: Secondary | ICD-10-CM | POA: Diagnosis not present

## 2019-01-31 DIAGNOSIS — I110 Hypertensive heart disease with heart failure: Secondary | ICD-10-CM | POA: Diagnosis not present

## 2019-02-02 ENCOUNTER — Telehealth: Payer: Self-pay | Admitting: *Deleted

## 2019-02-02 DIAGNOSIS — I951 Orthostatic hypotension: Secondary | ICD-10-CM | POA: Diagnosis not present

## 2019-02-02 DIAGNOSIS — I5022 Chronic systolic (congestive) heart failure: Secondary | ICD-10-CM | POA: Diagnosis not present

## 2019-02-02 DIAGNOSIS — Z951 Presence of aortocoronary bypass graft: Secondary | ICD-10-CM | POA: Diagnosis not present

## 2019-02-02 DIAGNOSIS — Z7982 Long term (current) use of aspirin: Secondary | ICD-10-CM | POA: Diagnosis not present

## 2019-02-02 DIAGNOSIS — I2510 Atherosclerotic heart disease of native coronary artery without angina pectoris: Secondary | ICD-10-CM | POA: Diagnosis not present

## 2019-02-02 DIAGNOSIS — I110 Hypertensive heart disease with heart failure: Secondary | ICD-10-CM | POA: Diagnosis not present

## 2019-02-02 NOTE — Telephone Encounter (Signed)
Received Home Health Certification and Plan of Care; forwarded to provider/SLS 02/14 

## 2019-02-04 NOTE — Progress Notes (Signed)
[place] at [facility] [address], [place], [place] [address] [phone]  Date:  02/05/2019   Name:  WADSWORTH SKOLNICK   DOB:  1926/10/22   MRN:  [id number]  PCP:  Darreld Mclean, MD    Chief Complaint: Hospitalization Follow-up (precordial pain, admitted 2/1)   History of Present Illness:  NICOLI NARDOZZI is a 85 y.o. very pleasant male patient who presents with the following:  Follow-up visit from hospital admission today  Admit date: 01/20/2019 Discharge date: 01/23/2019 Brief/Interim Summary:  Patient is a 85 year old male with past medical history of TIA, seizure disorder, chronic systolic CHF, coronary disease status post CABG, prostate cancer, carotid stenosis, hypertension, hyperlipidemia who presented from home to the emergency department with complaints of dizziness and syncopal episode.As per the wife, he fell 4 times in the lastmonth. He also had intermittent chest pain since last few days.His chest pain resolved during this hospitalization.  His chest pain was atypical.  Troponins were negative.  EKG did not show any ST changes.  Orthostatic vitals checked and theywere positive. Started on IV fluids.  He was still mildly orthostatic this morning.  Blood pressure overall is stable.  He is being discharged to home with home health today.  He has been instructed to be careful while getting up from lying or sitting position.  TED hose will be  recommended.  Tamsulosin will be stopped.  Following problems were addressed during his hospitalization:  Syncopal episode/frequent falls:Syncopized during micturition in the bathroom. . Orthostatic vitals checked ,were positive so it could be associated with orthostatic hypotension. Orthostatic vitals checked this morning are still mildly positive. He was given enough fluids for last 2 days. Patient evaluated by PT/OT and commended home health. He was on tamsulosin at  home which will be discontinued.  Application of TED hose encouraged. Chest pain:Resolved now.Atypical chest pain. Has been complaining of chest pain since last few days. Pain is midsternal, sharp not associated with exertion. EKG did not show any ST changes, showed first-degree AV block. His troponins have been negative. Echocardiogram has been done which showed ejection fraction of 45 to 50%, mild global hypokinesis.EF hasreduced from his last echo in 2018.Follow up with his cardiologist as an outpatient. Mild systolic EXB:[id number] euvolemic. No edema. He follows with Dr. Percival Spanish as an outpatient. Also on Entresto at home. History of coronary disease: Status post CABG. Continue current medications. Continue aspirin Prostate cancer: Follows with urology. Treated with Lupron. Needs to follow-up with urology .On Flomax at home will be discontinued due to orthostatic hypotension. Hypertension:Currently blood pressure stable. Continue current medications Thrombocytopenia: Chronic. Currently stable.   Discharge Diagnoses:  Principal Problem:   Near syncope Active Problems:   Essential hypertension   Chronic systolic heart failure (HCC)   Prostate cancer (HCC)   Hyperglycemia  Discharge Instructions      Discharge Instructions    Diet - low sodium heart healthy   Complete by:  As directed    Discharge instructions   Complete by:  As directed    1)Please follow up with PCP in a week. 2)Be careful while getting up from sitting or lying position.  You have a high risk for falls.  Always walk with the supervision of somebody.Apply TED hose.   Increase activity slowly   Complete by:  As directed      He did not have LOC, he had pre-syncope only per his report He now notes  that he is feeling overall better His Flomax was stopped, which may help to decrease his symptoms  BP Readings from Last 3 Encounters:  02/05/19 132/80  01/23/19 128/77  09/14/18 130/70    No further episodes of chest pain  He stopped his flomax but is still able to urinate ok  He is eating and drinking ok Wt Readings from Last 3 Encounters:  02/05/19 145 lb (65.8 kg)  01/23/19 143 lb 1.6 oz (64.9 kg)  09/14/18 145 lb (65.8 kg)   He admits to falling 4x in the past month; pt endorses that he had pre-syncope these other times as well  He does not think that he tripped and fell   He is doing home PT for a month- they are coming out 2x a week to help him.  They gave him some exercises  To have a walker but is not using it today They do not have a wearable emergency call button for him.  His wife is present today, and reports that she is really always with him.  Nevertheless, I do feel that an emergency call button may be helpful just in case they were separated.  I did suggest that they look into getting an emergency call system  Safi and his wife continue to do a lot for their family, including providing a substantial amount of childcare for great grandkids.  While this is a strain on them I also think it has positive aspects   We called his pharmacy and confirmed that he had a Prevnar in 2017   Patient Active Problem List   Diagnosis Date Noted  . Near syncope 01/20/2019  . Prostate cancer ([place]) 01/20/2019  . Hyperglycemia 01/20/2019  . TIA (transient ischemic attack) 04/04/2017  . Facial droop 04/04/2017  . Localized swelling of lower extremity 04/04/2017  . BPH (benign prostatic hyperplasia) 04/04/2017  . Stroke-like symptoms 04/04/2017  . Syncope and collapse 08/31/2016  . Chronic systolic heart failure ([place])   . Cerebral infarction, remote, resolved   . Mild dementia ([place]) 08/10/2016  . Sleep disturbance 01/06/2016  . Ventricular ectopy 12/31/2014  . Acute confusional state 12/31/2014  . Palpitations   . Nocturia 09/20/2013  . Hyponatremia 09/29/2012  . Fatigue 05/09/2012  . PERSONAL HISTORY OTHER DISORDER URINARY SYSTEM 12/22/2010  .  Thrombocytopenia ([place]) 08/04/2010  . LEUKOCYTOPENIA UNSPECIFIED 08/04/2010  . TINNITUS, CHRONIC 07/06/2010  . RENAL ARTERY STENOSIS 11/25/2009  . RENAL INSUFFICIENCY 11/25/2009  . DYSPNEA 09/24/2009  . BRADYCARDIA 09/04/2009  . HYPOTENSION-ORTHOSTATIC 09/04/2009  . DIZZINESS 09/04/2009  . HYPERLIPIDEMIA 01/31/2008  . DEPRESSIVE DISORDER 01/23/2008  . Essential hypertension 01/23/2008  . ANXIETY 06/02/2007  . Coronary atherosclerosis 06/02/2007  . Insomnia 06/02/2007    Past Medical History:  Diagnosis Date  . Anxiety   . Coronary artery disease    a. s/p CABG;  b. LHC (2/11):  LM 30-40, pLAD 95-99 then 80 and 90, mCFX 90, pRCA occluded, S-dRCA ok, S-D1 ok, S-OM patent with 50, L-LAD ok.  Med Rx recommended.  c.  Myoview (5/13):  Low risk with small fixed inf defect c/w scar, no ischemia, EF 48%.  d.  Echo (6/13):  EF 55-60%, inf HK, Gr 1 DD, MAC;  e. Lexiscan Myoview (10/14):  Low risk, EF 38%, inferobasal infarct, no ischemia.  . Fatigue   . Hx of echocardiogram    Echo (10/14):  EF 55-60%, Gr 1DD, MAC, mild MR, mild LAE.  Marland Kitchen Hyperlipidemia   . Hypertension   . Hyponatremia  09/2012   Associated with postural hypotension and confusion  . Insomnia   . Myocardial infarction ([place]) 2003   hx of   . Renal artery stenosis (HCC)    bilateral;  s/p R RA stent 2011  . Seizure ([place]) 2015   unknow etiology and year of seizure???  . Syncope and collapse 08/31/2016  . Systolic heart failure (HCC)    CHRONIC  . TIA (transient ischemic attack)     Past Surgical History:  Procedure Laterality Date  . CARDIAC CATHETERIZATION  2003   revdeling severe three-vessel disease  . CORONARY ARTERY BYPASS GRAFT     LIMA to LAD, SVG to diagonal, SVG to RCA, and SVG to circumflex  . POLYPECTOMY     colon  . RENAL ARTERY STENT  01-28-10    Social History   Tobacco Use  . Smoking status: Never Smoker  . Smokeless tobacco: Never Used  Substance Use Topics  . Alcohol use: No  . Drug use: No     Family History  Problem Relation Age of Onset  . Diabetes Mother   . Stroke Mother   . Heart attack Father 47       died  . Colon cancer Other        paternal grandfather    Allergies  Allergen Reactions  . Lisinopril Cough    Renal artery stenosis by history; ACE inhibitor  would be relatively contraindicated  . Zithromax [Azithromycin]     02/15/14 N&V  . Niacin Other (See Comments)    Burning sensations in head    Medication list has been reviewed and updated.  Current Outpatient Medications on File Prior to Visit  Medication Sig Dispense Refill  . aspirin 81 MG tablet Take 81 mg by mouth at bedtime.     Marland Kitchen CALCIUM-MAGNESIUM-ZINC PO Take 1 tablet by mouth daily.    . cholecalciferol (VITAMIN D) 1000 UNITS tablet Take 2,000 Units by mouth daily.    . clotrimazole (LOTRIMIN) 1 % cream Apply 1 application topically 2 (two) times daily. Use for about 2 weeks (Patient taking differently: Apply 1 application topically 2 (two) times daily as needed (rash). Use for about 2 weeks) 30 g 0  . ENTRESTO 49-51 MG TAKE 1 TABLET BY MOUTH TWICE A DAY 60 tablet 5  . ezetimibe (ZETIA) 10 MG tablet take 1/2 tablet by mouth once daily 45 tablet 3  . Melatonin 3 MG TBDP Take 1 tablet by mouth at bedtime as needed (sleep). Reported on 04/29/2016    . nitroGLYCERIN (NITROSTAT) 0.4 MG SL tablet DISSOLVE 1 TAB UNDER TONGUE AS NEEDED FOR CHEST PAIN, MAY REPEAT IN 5 MINUTES AS DIRECTED. ER IF NO BETTER. 25 tablet 2  . Omega-3 Fatty Acids (FISH OIL) 1200 MG CPDR Take 1 capsule by mouth daily.    . vitamin C (ASCORBIC ACID) 500 MG tablet Take 500 mg by mouth daily.     No current facility-administered medications on file prior to visit.     Review of Systems:  As per HPI- otherwise negative. No fever or chills  Physical Examination: Vitals:   02/05/19 1319  BP: 132/80  Pulse: 80  Resp: 16  Temp: 97.7 F (36.5 C)  SpO2: 98%   Vitals:   02/05/19 1319  Weight: 145 lb (65.8 kg)  Height:  5' 4"  (1.626 m)   Body mass index is 24.89 kg/m. Ideal Body Weight: Weight in (lb) to have BMI = 25: 145.3  GEN: WDWN, NAD, Non-toxic, A &  O x 3, elderly man who looks well, he is well dressed and groomed HEENT: Atraumatic, Normocephalic. Neck supple. No masses, No LAD. Ears and Nose: No external deformity. CV: RRR, No M/G/R. No JVD. No thrill. No extra heart sounds. PULM: CTA B, no wheezes, crackles, rhonchi. No retractions. No resp. distress. No accessory muscle use. ABD: S, NT, ND, +BS. No rebound. No HSM. EXTR: No c/c/e NEURO slow gait.  I do think he would be safer with a walker He is able to step up onto my exam table, but needs supervision PSYCH: Normally interactive. Conversant. Not depressed or anxious appearing.  Calm demeanor.   Supine:  84  160/90 Seated   80   132/80 Standing:  96,  142/80 Assessment and Plan: Hospital discharge follow-up  Near syncope  Following up from hospital admission today.  They report several episodes of presyncope and falls.  His Flomax was stopped, and we hope this will improve his symptoms. I also suggested compression socks to help minimize orthostatic symptoms They are getting PT at home.  I also encouraged that he use a walker, remove any trip and fall risks from the home, and get a call button to wear We also discussed shingles vaccine We will plan to recheck in the office as per normal routine unless any changes  Signed Lamar Blinks, MD

## 2019-02-05 ENCOUNTER — Ambulatory Visit (INDEPENDENT_AMBULATORY_CARE_PROVIDER_SITE_OTHER): Payer: Medicare HMO | Admitting: Family Medicine

## 2019-02-05 ENCOUNTER — Telehealth: Payer: Self-pay | Admitting: *Deleted

## 2019-02-05 ENCOUNTER — Encounter: Payer: Self-pay | Admitting: Family Medicine

## 2019-02-05 VITALS — BP 132/80 | HR 80 | Temp 97.7°F | Resp 16 | Ht 64.0 in | Wt 145.0 lb

## 2019-02-05 DIAGNOSIS — R55 Syncope and collapse: Secondary | ICD-10-CM | POA: Diagnosis not present

## 2019-02-05 DIAGNOSIS — I5022 Chronic systolic (congestive) heart failure: Secondary | ICD-10-CM | POA: Diagnosis not present

## 2019-02-05 DIAGNOSIS — Z09 Encounter for follow-up examination after completed treatment for conditions other than malignant neoplasm: Secondary | ICD-10-CM

## 2019-02-05 DIAGNOSIS — I110 Hypertensive heart disease with heart failure: Secondary | ICD-10-CM | POA: Diagnosis not present

## 2019-02-05 DIAGNOSIS — C61 Malignant neoplasm of prostate: Secondary | ICD-10-CM | POA: Diagnosis not present

## 2019-02-05 NOTE — Telephone Encounter (Signed)
Received Physician Orders from [place] for Commode, stationary, w/fixed arm; forwarded to provider/SLS 02/17

## 2019-02-05 NOTE — Patient Instructions (Addendum)
You might try some compression socks - you can get some at a sporting goods store- to support your legs and keep your BP from dropping when you stand up The socks are sometimes easier to pull on than compression hose  A walker and a wearable call button may also be a good idea as you are having frequent falls  You had a pneumonia vaccine at [facility] in 2016, so you should be current in this regard You can also have the new shingles vaccine, called Shingrix, at your drugstore at your convenience.  This is a 2 shot series Please let me know if I can do anything else to help, and take care!

## 2019-02-07 DIAGNOSIS — I110 Hypertensive heart disease with heart failure: Secondary | ICD-10-CM | POA: Diagnosis not present

## 2019-02-07 DIAGNOSIS — Z951 Presence of aortocoronary bypass graft: Secondary | ICD-10-CM | POA: Diagnosis not present

## 2019-02-07 DIAGNOSIS — I951 Orthostatic hypotension: Secondary | ICD-10-CM | POA: Diagnosis not present

## 2019-02-07 DIAGNOSIS — I2510 Atherosclerotic heart disease of native coronary artery without angina pectoris: Secondary | ICD-10-CM | POA: Diagnosis not present

## 2019-02-07 DIAGNOSIS — I5022 Chronic systolic (congestive) heart failure: Secondary | ICD-10-CM | POA: Diagnosis not present

## 2019-02-07 DIAGNOSIS — Z7982 Long term (current) use of aspirin: Secondary | ICD-10-CM | POA: Diagnosis not present

## 2019-02-08 DIAGNOSIS — Z7982 Long term (current) use of aspirin: Secondary | ICD-10-CM | POA: Diagnosis not present

## 2019-02-08 DIAGNOSIS — I951 Orthostatic hypotension: Secondary | ICD-10-CM | POA: Diagnosis not present

## 2019-02-08 DIAGNOSIS — Z7409 Other reduced mobility: Secondary | ICD-10-CM | POA: Diagnosis not present

## 2019-02-08 DIAGNOSIS — I5022 Chronic systolic (congestive) heart failure: Secondary | ICD-10-CM | POA: Diagnosis not present

## 2019-02-08 DIAGNOSIS — I110 Hypertensive heart disease with heart failure: Secondary | ICD-10-CM | POA: Diagnosis not present

## 2019-02-08 DIAGNOSIS — R269 Unspecified abnormalities of gait and mobility: Secondary | ICD-10-CM | POA: Diagnosis not present

## 2019-02-08 DIAGNOSIS — I2510 Atherosclerotic heart disease of native coronary artery without angina pectoris: Secondary | ICD-10-CM | POA: Diagnosis not present

## 2019-02-08 DIAGNOSIS — Z951 Presence of aortocoronary bypass graft: Secondary | ICD-10-CM | POA: Diagnosis not present

## 2019-02-09 DIAGNOSIS — I5022 Chronic systolic (congestive) heart failure: Secondary | ICD-10-CM | POA: Diagnosis not present

## 2019-02-09 DIAGNOSIS — I110 Hypertensive heart disease with heart failure: Secondary | ICD-10-CM | POA: Diagnosis not present

## 2019-02-09 DIAGNOSIS — I951 Orthostatic hypotension: Secondary | ICD-10-CM | POA: Diagnosis not present

## 2019-02-09 DIAGNOSIS — Z7982 Long term (current) use of aspirin: Secondary | ICD-10-CM | POA: Diagnosis not present

## 2019-02-09 DIAGNOSIS — I2510 Atherosclerotic heart disease of native coronary artery without angina pectoris: Secondary | ICD-10-CM | POA: Diagnosis not present

## 2019-02-09 DIAGNOSIS — Z951 Presence of aortocoronary bypass graft: Secondary | ICD-10-CM | POA: Diagnosis not present

## 2019-02-12 DIAGNOSIS — I5022 Chronic systolic (congestive) heart failure: Secondary | ICD-10-CM | POA: Diagnosis not present

## 2019-02-12 DIAGNOSIS — I110 Hypertensive heart disease with heart failure: Secondary | ICD-10-CM | POA: Diagnosis not present

## 2019-02-12 DIAGNOSIS — Z951 Presence of aortocoronary bypass graft: Secondary | ICD-10-CM | POA: Diagnosis not present

## 2019-02-12 DIAGNOSIS — I2510 Atherosclerotic heart disease of native coronary artery without angina pectoris: Secondary | ICD-10-CM | POA: Diagnosis not present

## 2019-02-12 DIAGNOSIS — I951 Orthostatic hypotension: Secondary | ICD-10-CM | POA: Diagnosis not present

## 2019-02-12 DIAGNOSIS — Z7982 Long term (current) use of aspirin: Secondary | ICD-10-CM | POA: Diagnosis not present

## 2019-02-13 DIAGNOSIS — I2510 Atherosclerotic heart disease of native coronary artery without angina pectoris: Secondary | ICD-10-CM | POA: Diagnosis not present

## 2019-02-13 DIAGNOSIS — Z7982 Long term (current) use of aspirin: Secondary | ICD-10-CM | POA: Diagnosis not present

## 2019-02-13 DIAGNOSIS — I110 Hypertensive heart disease with heart failure: Secondary | ICD-10-CM | POA: Diagnosis not present

## 2019-02-13 DIAGNOSIS — I5022 Chronic systolic (congestive) heart failure: Secondary | ICD-10-CM | POA: Diagnosis not present

## 2019-02-13 DIAGNOSIS — Z951 Presence of aortocoronary bypass graft: Secondary | ICD-10-CM | POA: Diagnosis not present

## 2019-02-13 DIAGNOSIS — I951 Orthostatic hypotension: Secondary | ICD-10-CM | POA: Diagnosis not present

## 2019-02-22 DIAGNOSIS — Z7982 Long term (current) use of aspirin: Secondary | ICD-10-CM | POA: Diagnosis not present

## 2019-02-22 DIAGNOSIS — I110 Hypertensive heart disease with heart failure: Secondary | ICD-10-CM | POA: Diagnosis not present

## 2019-02-22 DIAGNOSIS — I951 Orthostatic hypotension: Secondary | ICD-10-CM | POA: Diagnosis not present

## 2019-02-22 DIAGNOSIS — Z951 Presence of aortocoronary bypass graft: Secondary | ICD-10-CM | POA: Diagnosis not present

## 2019-02-22 DIAGNOSIS — I2510 Atherosclerotic heart disease of native coronary artery without angina pectoris: Secondary | ICD-10-CM | POA: Diagnosis not present

## 2019-02-22 DIAGNOSIS — I5022 Chronic systolic (congestive) heart failure: Secondary | ICD-10-CM | POA: Diagnosis not present

## 2019-03-07 ENCOUNTER — Telehealth: Payer: Self-pay | Admitting: *Deleted

## 2019-03-07 NOTE — Telephone Encounter (Signed)
Received Home Health Discharge-Transfer Summary for review from [facility]; forwarded to provider/SLS 03/18  

## 2019-03-14 ENCOUNTER — Other Ambulatory Visit: Payer: Self-pay | Admitting: Cardiology

## 2019-03-14 ENCOUNTER — Other Ambulatory Visit: Payer: Self-pay | Admitting: Family Medicine

## 2019-03-28 ENCOUNTER — Telehealth: Payer: Self-pay | Admitting: Cardiology

## 2019-03-28 DIAGNOSIS — C61 Malignant neoplasm of prostate: Secondary | ICD-10-CM | POA: Diagnosis not present

## 2019-03-28 NOTE — Telephone Encounter (Signed)
  Human has questions that need to be answered for Prior Auth on [place]

## 2019-03-29 NOTE — Telephone Encounter (Signed)
I need pharmacy contact information. Phone #.

## 2019-03-29 NOTE — Telephone Encounter (Signed)
nevermind it's at the bottom of the message.

## 2019-04-03 NOTE — Telephone Encounter (Signed)
[employer] was called and all questions were answered. Representative states it can take up to 72 hours for a decision on the Entresto.

## 2019-04-05 ENCOUNTER — Telehealth: Payer: Self-pay | Admitting: *Deleted

## 2019-04-05 NOTE — Telephone Encounter (Signed)
Copied from [place] [id number]. Topic: Appointment Scheduling - Scheduling Inquiry for Clinic >> Apr 04, 2019  6:30 PM Victor Waters wrote: Reason for CRM: Patient would like to r/s 04/20 appt

## 2019-04-05 NOTE — Telephone Encounter (Signed)
Spoke with pt and spouse; states they do not have a smart phone and would like to r/s due to current pandemic. Appt r/s for 05/09/19 at 11:20am. Advised them if any illness symptoms develop to keep in mind that we can do a virtual visit is a family member has access to a smartphone and he voices understanding.

## 2019-04-06 DIAGNOSIS — C61 Malignant neoplasm of prostate: Secondary | ICD-10-CM | POA: Diagnosis not present

## 2019-04-06 DIAGNOSIS — Z79899 Other long term (current) drug therapy: Secondary | ICD-10-CM | POA: Diagnosis not present

## 2019-04-09 ENCOUNTER — Ambulatory Visit: Payer: Medicare HMO | Admitting: Family Medicine

## 2019-05-04 ENCOUNTER — Telehealth: Payer: Self-pay | Admitting: Cardiology

## 2019-05-04 NOTE — Telephone Encounter (Signed)
Sent MyChart message to schedule 1 year followup with Dr. Percival Spanish.

## 2019-05-08 NOTE — Progress Notes (Signed)
[facility] [address], [place], [place] [address] [phone]  Date:  05/09/2019   Name:  Victor Waters   DOB:  11-11-1926   MRN:  [id number]  PCP:  Darreld Mclean, MD    Chief Complaint: No chief complaint on file.   History of Present Illness:  Victor Waters is a 85 y.o. very pleasant male patient who presents with the following:  In person visit today for routine follow-up visit- we had to convert to a phone visit as pt admitted to a cough today, we are not able to see sick persons in the office due to lack of PPE during pandemic Patient location is home Provider location is office Patient identity confirmed with 2 identifiers, he gives consent for virtual visit today I last saw this patient in February for hospital follow-up History of TIA, seizure disorder, chronic CHF, CAD status post CABG, prostate cancer, carotid stenosis, hypertension, hyperlipidemia, mild dementia.  His cardiologist is Dr.Hochrein-Per chart, they are trying to set up a virtual recheck visit soon His urologist is Dr. Diona Fanti, he was seen last month for follow-up of prostate cancer-he is on hormonal therapy for treatment and is responding well  Aspirin 37 Entresto Zetia Nitro as needed Omega-3 supplement Could use a cholesterol profile and labs soon  Consider giving a Pneumovax, tetanus and next in person visit Today Bill notes that the cough has been going on for "a short time," cannot tell me exactly how long.  He thinks it is due to something that he ate last night. The cough is dry, he is otherwise feeling fine, no fever  No chest pain noted  No lower extremity swelling or weight gain  Overall Bill notes that he is feeling fine.   Patient Active Problem List   Diagnosis Date Noted  . Near syncope 01/20/2019  . Prostate cancer ([place]) 01/20/2019  . Hyperglycemia 01/20/2019  . TIA (transient ischemic attack) 04/04/2017   . Facial droop 04/04/2017  . Localized swelling of lower extremity 04/04/2017  . BPH (benign prostatic hyperplasia) 04/04/2017  . Stroke-like symptoms 04/04/2017  . Syncope and collapse 08/31/2016  . Chronic systolic heart failure ([place])   . Cerebral infarction, remote, resolved   . Mild dementia ([place]) 08/10/2016  . Sleep disturbance 01/06/2016  . Ventricular ectopy 12/31/2014  . Acute confusional state 12/31/2014  . Palpitations   . Nocturia 09/20/2013  . Hyponatremia 09/29/2012  . Fatigue 05/09/2012  . PERSONAL HISTORY OTHER DISORDER URINARY SYSTEM 12/22/2010  . Thrombocytopenia ([place]) 08/04/2010  . LEUKOCYTOPENIA UNSPECIFIED 08/04/2010  . TINNITUS, CHRONIC 07/06/2010  . RENAL ARTERY STENOSIS 11/25/2009  . RENAL INSUFFICIENCY 11/25/2009  . DYSPNEA 09/24/2009  . BRADYCARDIA 09/04/2009  . HYPOTENSION-ORTHOSTATIC 09/04/2009  . DIZZINESS 09/04/2009  . HYPERLIPIDEMIA 01/31/2008  . DEPRESSIVE DISORDER 01/23/2008  . Essential hypertension 01/23/2008  . ANXIETY 06/02/2007  . Coronary atherosclerosis 06/02/2007  . Insomnia 06/02/2007    Past Medical History:  Diagnosis Date  . Anxiety   . Coronary artery disease    a. s/p CABG;  b. LHC (2/11):  LM 30-40, pLAD 95-99 then 80 and 90, mCFX 90, pRCA occluded, S-dRCA ok, S-D1 ok, S-OM patent with 50, L-LAD ok.  Med Rx recommended.  c.  Myoview (5/13):  Low risk with small fixed inf defect c/w scar, no ischemia, EF 48%.  d.  Echo (6/13):  EF 55-60%, inf HK, Gr 1 DD, MAC;  e. Carlton Adam Myoview (  10/14):  Low risk, EF 38%, inferobasal infarct, no ischemia.  . Fatigue   . Hx of echocardiogram    Echo (10/14):  EF 55-60%, Gr 1DD, MAC, mild MR, mild LAE.  Marland Kitchen Hyperlipidemia   . Hypertension   . Hyponatremia 09/2012   Associated with postural hypotension and confusion  . Insomnia   . Myocardial infarction ([place]) 2003   hx of   . Renal artery stenosis ([facility])    bilateral;  s/p R RA stent 2011  . Seizure ([place]) 2015   unknow etiology and year  of seizure???  . Syncope and collapse 08/31/2016  . Systolic heart failure ([facility])    CHRONIC  . TIA (transient ischemic attack)     Past Surgical History:  Procedure Laterality Date  . CARDIAC CATHETERIZATION  2003   revdeling severe three-vessel disease  . CORONARY ARTERY BYPASS GRAFT     LIMA to LAD, SVG to diagonal, SVG to RCA, and SVG to circumflex  . POLYPECTOMY     colon  . RENAL ARTERY STENT  01-28-10    Social History   Tobacco Use  . Smoking status: Never Smoker  . Smokeless tobacco: Never Used  Substance Use Topics  . Alcohol use: No  . Drug use: No    Family History  Problem Relation Age of Onset  . Diabetes Mother   . Stroke Mother   . Heart attack Father 16       died  . Colon cancer Other        paternal grandfather    Allergies  Allergen Reactions  . Lisinopril Cough    Renal artery stenosis by history; ACE inhibitor  would be relatively contraindicated  . Zithromax [Azithromycin]     02/15/14 N&V  . Niacin Other (See Comments)    Burning sensations in head    Medication list has been reviewed and updated.  Current Outpatient Medications on File Prior to Visit  Medication Sig Dispense Refill  . aspirin 81 MG tablet Take 81 mg by mouth at bedtime.     Marland Kitchen CALCIUM-MAGNESIUM-ZINC PO Take 1 tablet by mouth daily.    . cholecalciferol (VITAMIN D) 1000 UNITS tablet Take 2,000 Units by mouth daily.    . clotrimazole (LOTRIMIN) 1 % cream Apply 1 application topically 2 (two) times daily. Use for about 2 weeks (Patient taking differently: Apply 1 application topically 2 (two) times daily as needed (rash). Use for about 2 weeks) 30 g 0  . ENTRESTO 49-51 MG TAKE 1 TABLET BY MOUTH TWICE A DAY 60 tablet 5  . ezetimibe (ZETIA) 10 MG tablet TAKE 1/2 TABLET BY MOUTH ONCE DAILY 45 tablet 3  . Melatonin 3 MG TBDP Take 1 tablet by mouth at bedtime as needed (sleep). Reported on 04/29/2016    . nitroGLYCERIN (NITROSTAT) 0.4 MG SL tablet DISSOLVE 1 TAB UNDER TONGUE AS  NEEDED FOR CHEST PAIN, MAY REPEAT IN 5 MINUTES AS DIRECTED. ER IF NO BETTER. 25 tablet 2  . Omega-3 Fatty Acids (FISH OIL) 1200 MG CPDR Take 1 capsule by mouth daily.    . vitamin C (ASCORBIC ACID) 500 MG tablet Take 500 mg by mouth daily.     No current facility-administered medications on file prior to visit.     Review of Systems:  As per HPI- otherwise negative. No fever, no chills.  Mild cough  Physical Examination: There were no vitals filed for this visit. There were no vitals filed for this visit. There is no height  or weight on file to calculate BMI. Ideal Body Weight:    Home BP is running "fine," they do not have exact numbers  Bill sounds normal, no cough, wheezing, distress is noted  Assessment and Plan: Prostate cancer ([place])  HYPERLIPIDEMIA  Essential hypertension  Atherosclerosis of native coronary artery of native heart without angina pectoris  Routine follow-up visit today, virtual Prostate cancer is under control, managed by urology.  He is responding well, they note next follow-up visit is scheduled for August Continue Zetia for lipids.  Suggested lipid panel next blood draw He is taking Entresto, continue this medication I asked patient to proceed with planned follow-up with his specialists, including cardiology and urology.  We will plan to visit face-to-face in the fall, can update immunizations at that time.  He notes a mild cough, which is why we are not able to visit in the office today.  I have asked him to alert me if this continues or worsens, I can call in an antibiotic if need be Phone call 5:42 Signed Lamar Blinks, MD

## 2019-05-08 NOTE — Patient Instructions (Signed)
It was nice to see you today, take care You might consider getting the new shingles vaccine, called Shingrix, at your pharmacy

## 2019-05-09 ENCOUNTER — Encounter: Payer: Self-pay | Admitting: Family Medicine

## 2019-05-09 ENCOUNTER — Other Ambulatory Visit: Payer: Self-pay

## 2019-05-09 ENCOUNTER — Ambulatory Visit (INDEPENDENT_AMBULATORY_CARE_PROVIDER_SITE_OTHER): Payer: Medicare HMO | Admitting: Family Medicine

## 2019-05-09 DIAGNOSIS — R05 Cough: Secondary | ICD-10-CM

## 2019-05-09 DIAGNOSIS — I2510 Atherosclerotic heart disease of native coronary artery without angina pectoris: Secondary | ICD-10-CM

## 2019-05-09 DIAGNOSIS — C61 Malignant neoplasm of prostate: Secondary | ICD-10-CM | POA: Diagnosis not present

## 2019-05-09 DIAGNOSIS — E782 Mixed hyperlipidemia: Secondary | ICD-10-CM | POA: Diagnosis not present

## 2019-05-09 DIAGNOSIS — I10 Essential (primary) hypertension: Secondary | ICD-10-CM

## 2019-05-23 ENCOUNTER — Telehealth: Payer: Self-pay | Admitting: *Deleted

## 2019-05-23 NOTE — Telephone Encounter (Signed)
A message was left, re: follow up visit. 

## 2019-07-08 NOTE — Progress Notes (Signed)
Virtual Visit via Telephone Note   This visit type was conducted due to national recommendations for restrictions regarding the COVID-19 Pandemic (e.g. social distancing) in an effort to limit this patient's exposure and mitigate transmission in our community.  Due to his co-morbid illnesses, this patient is at least at moderate risk for complications without adequate follow up.  This format is felt to be most appropriate for this patient at this time.  The patient did not have access to video technology/had technical difficulties with video requiring transitioning to audio format only (telephone).  All issues noted in this document were discussed and addressed.  No physical exam could be performed with this format.  Please refer to the patient's chart for his  consent to telehealth for [facility].   Date:  07/10/2019   ID:  Victor Waters, DOB [birth year], MRN [id number]  Patient Location: Home Provider Location: Home  PCP:  Copland, Gay Filler, MD  Cardiologist:  Minus Breeding, MD  Electrophysiologist:  None   Evaluation Performed:  Follow-Up Visit  Chief Complaint:  CAD  History of Present Illness:    Victor Waters is a 85 y.o. male who presents for follow up of CAD/CABG:  He also has a cardiomyopathy with an EF of 25 - 30% at one point but improved to normal on the most recent echo in 2018.  He had a perfusion study with no ischemia in 2016.  I last saw him in 2017.  He was in the hospital in 2018 with TIAs without clear source. When I saw him last year he had lost his daughter and granddaughter.  He was helping to  take care of 3 great-grandchildren.  He had been diagnosed and treated with prostate cancer and treated.  Since I last saw him he has done OK.  He is getting more fatigued.  The patient denies any new symptoms such as chest discomfort, neck or arm discomfort. There has been no new shortness of breath, PND or orthopnea. There have been no reported palpitations, presyncope  or syncope.  He does have a walker but he doesn't use it.  They just got a puppy.    The patient does not have symptoms concerning for COVID-19 infection (fever, chills, cough, or new shortness of breath).    Past Medical History:  Diagnosis Date   Anxiety    Coronary artery disease    a. s/p CABG;  b. LHC (2/11):  LM 30-40, pLAD 95-99 then 80 and 90, mCFX 90, pRCA occluded, S-dRCA ok, S-D1 ok, S-OM patent with 50, L-LAD ok.  Med Rx recommended.  c.  Myoview (5/13):  Low risk with small fixed inf defect c/w scar, no ischemia, EF 48%.  d.  Echo (6/13):  EF 55-60%, inf HK, Gr 1 DD, MAC;  e. Lexiscan Myoview (10/14):  Low risk, EF 38%, inferobasal infarct, no ischemia.   Fatigue    Hx of echocardiogram    Echo (10/14):  EF 55-60%, Gr 1DD, MAC, mild MR, mild LAE.   Hyperlipidemia    Hypertension    Hyponatremia 09/2012   Associated with postural hypotension and confusion   Insomnia    Myocardial infarction [facility]) 2003   hx of    Renal artery stenosis ([facility])    bilateral;  s/p R RA stent 2011   Seizure ([place]) 2015   unknow etiology and year of seizure???   Syncope and collapse 03/50/0938   Systolic heart failure ([facility])    CHRONIC  TIA (transient ischemic attack)    Past Surgical History:  Procedure Laterality Date   CARDIAC CATHETERIZATION  2003   revdeling severe three-vessel disease   CORONARY ARTERY BYPASS GRAFT     LIMA to LAD, SVG to diagonal, SVG to RCA, and SVG to circumflex   POLYPECTOMY     colon   RENAL ARTERY STENT  01-28-10     Current Meds  Medication Sig   aspirin 81 MG tablet Take 81 mg by mouth at bedtime.    CALCIUM-MAGNESIUM-ZINC PO Take 1 tablet by mouth daily.   cholecalciferol (VITAMIN D) 1000 UNITS tablet Take 2,000 Units by mouth daily.   clotrimazole (LOTRIMIN) 1 % cream Apply 1 application topically 2 (two) times daily. Use for about 2 weeks (Patient taking differently: Apply 1 application topically 2 (two) times daily as needed  (rash). Use for about 2 weeks)   ENTRESTO 49-51 MG TAKE 1 TABLET BY MOUTH TWICE A DAY   ezetimibe (ZETIA) 10 MG tablet TAKE 1/2 TABLET BY MOUTH ONCE DAILY   Melatonin 3 MG TBDP Take 1 tablet by mouth at bedtime as needed (sleep). Reported on 04/29/2016   nitroGLYCERIN (NITROSTAT) 0.4 MG SL tablet DISSOLVE 1 TAB UNDER TONGUE AS NEEDED FOR CHEST PAIN, MAY REPEAT IN 5 MINUTES AS DIRECTED. ER IF NO BETTER.   Omega-3 Fatty Acids (FISH OIL) 1200 MG CPDR Take 1 capsule by mouth daily.   vitamin C (ASCORBIC ACID) 500 MG tablet Take 500 mg by mouth daily.     Allergies:   Lisinopril, Zithromax [azithromycin], and Niacin   Social History   Tobacco Use   Smoking status: Never Smoker   Smokeless tobacco: Never Used  Substance Use Topics   Alcohol use: No   Drug use: No     Family Hx: The patient's family history includes Colon cancer in an other family member; Diabetes in his mother; Heart attack (age of onset: 49) in his father; Stroke in his mother.  ROS:   Please see the history of present illness.    As stated in the HPI and negative for all other systems.   Prior CV studies:   The following studies were reviewed today:  None  Labs/Other Tests and Data Reviewed:    EKG:  None  Recent Labs: 01/20/2019: TSH 2.258 01/21/2019: BUN 20; Creatinine, Ser 1.02; Hemoglobin 13.1; Platelets 131; Potassium 4.0; Sodium 135   Recent Lipid Panel Lab Results  Component Value Date/Time   CHOL 224 (H) 04/05/2017 05:43 AM   TRIG 88 04/05/2017 05:43 AM   HDL 38 (L) 04/05/2017 05:43 AM   CHOLHDL 5.9 04/05/2017 05:43 AM   LDLCALC 168 (H) 04/05/2017 05:43 AM    Wt Readings from Last 3 Encounters:  07/10/19 150 lb 6.4 oz (68.2 kg)  02/05/19 145 lb (65.8 kg)  01/23/19 143 lb 1.6 oz (64.9 kg)     Objective:    Vital Signs:  Wt 150 lb 6.4 oz (68.2 kg)    BMI 25.82 kg/m    NA  ASSESSMENT & PLAN:    HTN:     He had a BP check but could not use it.   I sent a message to our social  worker to see if there is a simple machine we might suggest.    DYSLIPIDEMIA:   I will hold off on  I will have him come back for a fasting lipid profile.     CAD:  The patient has no new sypmtoms.  No further  cardiovascular testing is indicated.  We will continue with aggressive risk reduction and meds as listed.  CARDIOMYOPATHY:  His EF is normal.  Given his weakness and fatigue I am just going to get rid of the beta-blocker to see if this helps at all.  We are working towards quality of life in the situation.  COVID-19 Education: The signs and symptoms of COVID-19 were discussed with the patient and how to seek care for testing (follow up with PCP or arrange E-visit).  The importance of social distancing was discussed today.  Time:   Today, I have spent 16 minutes with the patient with telehealth technology discussing the above problems.     Medication Adjustments/Labs and Tests Ordered: Current medicines are reviewed at length with the patient today.  Concerns regarding medicines are outlined above.   Tests Ordered: No orders of the defined types were placed in this encounter.   Medication Changes: No orders of the defined types were placed in this encounter.   Follow Up:  Virtual Visit with me in six months.   Signed, Minus Breeding, MD  07/10/2019 5:22 PM    [facility]

## 2019-07-09 ENCOUNTER — Telehealth: Payer: Self-pay

## 2019-07-09 NOTE — Telephone Encounter (Signed)
SPOKE WITH PT WIFE

## 2019-07-09 NOTE — Telephone Encounter (Signed)
Tried to call pt to see if he would like to change to a virtual visit d/t age Unable to contact pt, phone just keeps ringing

## 2019-07-09 NOTE — Telephone Encounter (Signed)
LM2CB-Cheri Ouida Sills (daughter) [phone] [place], [place]- person answering says this is the wrong number no Mariann Laster at this number

## 2019-07-09 NOTE — Telephone Encounter (Signed)
Tried to call x2 phone busy

## 2019-07-10 ENCOUNTER — Telehealth: Payer: Self-pay

## 2019-07-10 ENCOUNTER — Other Ambulatory Visit: Payer: Self-pay

## 2019-07-10 ENCOUNTER — Telehealth (INDEPENDENT_AMBULATORY_CARE_PROVIDER_SITE_OTHER): Payer: Medicare HMO | Admitting: Cardiology

## 2019-07-10 ENCOUNTER — Encounter: Payer: Self-pay | Admitting: Cardiology

## 2019-07-10 VITALS — Wt 150.4 lb

## 2019-07-10 DIAGNOSIS — E782 Mixed hyperlipidemia: Secondary | ICD-10-CM

## 2019-07-10 DIAGNOSIS — I2510 Atherosclerotic heart disease of native coronary artery without angina pectoris: Secondary | ICD-10-CM | POA: Diagnosis not present

## 2019-07-10 DIAGNOSIS — I10 Essential (primary) hypertension: Secondary | ICD-10-CM

## 2019-07-10 NOTE — Telephone Encounter (Signed)
Called patient to prechart before his 11:00 Virtual Visit with Dr. Percival Spanish and no one answered. There was no voice message to leave.

## 2019-07-11 ENCOUNTER — Telehealth: Payer: Self-pay | Admitting: Licensed Clinical Social Worker

## 2019-07-11 NOTE — Telephone Encounter (Signed)
LM2CB 

## 2019-07-11 NOTE — Telephone Encounter (Signed)
CSW referred to assist patient with obtaining a BP cuff. CSW contacted patient to inform cuff will be delivered to home. Patient grateful for support and assistance. CSW available as needed. Jackie Zoi Devine, LCSW, CCSW-MCS [phone]  

## 2019-07-20 ENCOUNTER — Telehealth: Payer: Self-pay | Admitting: *Deleted

## 2019-07-20 NOTE — Telephone Encounter (Signed)
Spoke with wife and blood pressure machine received and no issues using. Wife expressed appreciation for obtaining for them

## 2019-08-08 DIAGNOSIS — C61 Malignant neoplasm of prostate: Secondary | ICD-10-CM | POA: Diagnosis not present

## 2019-08-14 ENCOUNTER — Other Ambulatory Visit: Payer: Self-pay | Admitting: Cardiology

## 2019-08-15 DIAGNOSIS — C61 Malignant neoplasm of prostate: Secondary | ICD-10-CM | POA: Diagnosis not present

## 2019-11-23 ENCOUNTER — Telehealth: Payer: Self-pay | Admitting: Cardiology

## 2019-11-23 NOTE — Telephone Encounter (Signed)
New Message  Pt c/o swelling: STAT is pt has developed SOB within 24 hours  1) How much weight have you gained and in what time span? No  2) If swelling, where is the swelling located? Feet and toes  3) Are you currently taking a fluid pill? No  4) Are you currently SOB? No  5) Do you have a log of your daily weights (if so, list)? Yes  6) Have you gained 3 pounds in a day or 5 pounds in a week? No  7) Have you traveled recently? No

## 2019-11-23 NOTE — Telephone Encounter (Signed)
Pt c/o swelling: STAT is pt has developed SOB within 24 hours  1) How much weight have you gained and in what time span?   2) If swelling, where is the swelling located? Feet   3) Are you currently taking a fluid pill? NO  4) Are you currently SOB? NO  5) Do you have a log of your daily weights (if so, list)?  NO  6) Have you gained 3 pounds in a day or 5 pounds in a week? PT JUST TOLD WIFE ABOUT THIS ISSUE SHE IS VERY CONCERNED WANTS TO SPEAK TO THE RN  7) Have you traveled recently?

## 2019-11-23 NOTE — Telephone Encounter (Signed)
Unable to reach pt or leave a message -no answer.

## 2019-11-23 NOTE — Telephone Encounter (Signed)
Spoke to patient he stated he has noticed he is sob.Stated sob started yesterday.He has swelling in both lower legs and feet.Stated tries to eat a low salt diet,but he did eat chicken pie yesterday.Weight is stable.No chest pain.He has been taking all medications as prescribed.Advised to keep feet elevated as much as he can.Eat low salt diet.Virtual appointment scheduled with Coletta Memos Mon 12/7 at 9:30 am.Advised to go to ED if sob gets worse.

## 2019-11-25 NOTE — Progress Notes (Signed)
Virtual Visit via Telephone Note   This visit type was conducted due to national recommendations for restrictions regarding the COVID-19 Pandemic (e.g. social distancing) in an effort to limit this patient's exposure and mitigate transmission in our community.  Due to his co-morbid illnesses, this patient is at least at moderate risk for complications without adequate follow up.  This format is felt to be most appropriate for this patient at this time.  The patient did not have access to video technology/had technical difficulties with video requiring transitioning to audio format only (telephone).  All issues noted in this document were discussed and addressed.  No physical exam could be performed with this format.  Please refer to the patient's chart for his  consent to telehealth for [facility].  Evaluation Performed:  Follow-up visit  This visit type was conducted due to national recommendations for restrictions regarding the COVID-19 Pandemic (e.g. social distancing).  This format is felt to be most appropriate for this patient at this time.  All issues noted in this document were discussed and addressed.  No physical exam was performed (except for noted visual exam findings with Video Visits).  Please refer to the patient's chart (MyChart message for video visits and phone note for telephone visits) for the patient's consent to telehealth for [facility]  Date:  11/26/2019   ID:  Quintella Reichert, DOB 02-16-1926, MRN [id number]  Patient Location:  [place] [address]   Provider location:   [facility] [place] [address], [place] [address]  PCP:  Darreld Mclean, MD  Cardiologist:  Minus Breeding, MD  Electrophysiologist:  None   Chief Complaint: Increased work of breathing  History of Present Illness:    TIMOTHEE Victor Waters is a 85 y.o. male who presents via audio/video conferencing for a telehealth visit today.  Patient  verified DOB and address.  The patient does not symptoms concerning for COVID-19 infection (fever, chills, cough, or new SHORTNESS OF BREATH).   Luanna Cole Steinbach 85 year old male seen today via virtual platform has a past medical history of essential hypertension, coronary atherosclerosis, hypertension, syncope and collapse, chronic systolic heart failure, hyperlipidemia, anxiety, bradycardia, dyspnea, and TIA.  He also has CAD/CABGx3 2003 (LIMA to LAD, SVG to diagonal, SVG to RCA, and SVG to circumflex).  His most recent echocardiogram from 01/20/2019 showed an LVEF of 45 to 50% mild LVH, mild global hypokinesis pseudonormal diastolic filling, and mild pulmonic valve regurgitation.  He was last seen by Dr. Percival Spanish on 07/10/2019.  During that time he was doing well.  He called the cardiology triage nurse on 11/23/2019 and indicated that he noticed he was having some increased shortness of breath.  Today his wife states he continues to be very active and indicates he was up this morning at 6 AM doing chores around the house.  She does not notice any shortness of breath this morning however, she states that he does have lower extremity swelling.  She states that the swelling is somewhat better and he does not have any swelling in his toes like he did when she called on 11/23/2019.  She states they are trying to follow a low-sodium diet.  She states she has increased work of breathing with more vigorous activities but normal breathing with regular daily activities.  I will recommend following the salty 6 she, lower extremity support stockings, elevating extremities when not active, and daily weights.  Follow-up in 1 week  virtually  He denies chest pain, shortness of breath, increased lower extremity edema, fatigue, palpitations, melena, hematuria, hemoptysis, diaphoresis, weakness, presyncope, syncope, orthopnea, and PND.   Prior CV studies:   The following studies were reviewed today:  Echocardiogram  01/2019 IMPRESSIONS    1. The left ventricle has mildly reduced systolic function of 79-15%. The cavity size is mildly increased. There is mild concentric left ventricular wall thickness. Echo evidence of pseudonormal diastolic filling patterns.  2. Mild global hypokinesis.  3. Normal left atrial size.  4. Normal right atrial size.  5. The mitral valve normal in structure. There is mild mitral annular calcification present. Regurgitation is trivial by color flow Doppler.  6. Normal tricuspid valve.  7. The aortic valve tricuspid. There is severe thickening and severe calcification of the aortic valve.  8. Pulmonic valve regurgitation is mild by color flow Doppler.  9. The inferior vena cava was normal in size <50% respiratory variablity. 10. No atrial level shunt detected by color flow Doppler.   Past Medical History:  Diagnosis Date  . Anxiety   . Coronary artery disease    a. s/p CABG;  b. LHC (2/11):  LM 30-40, pLAD 95-99 then 80 and 90, mCFX 90, pRCA occluded, S-dRCA ok, S-D1 ok, S-OM patent with 50, L-LAD ok.  Med Rx recommended.  c.  Myoview (5/13):  Low risk with small fixed inf defect c/w scar, no ischemia, EF 48%.  d.  Echo (6/13):  EF 55-60%, inf HK, Gr 1 DD, MAC;  e. Lexiscan Myoview (10/14):  Low risk, EF 38%, inferobasal infarct, no ischemia.  . Fatigue   . Hx of echocardiogram    Echo (10/14):  EF 55-60%, Gr 1DD, MAC, mild MR, mild LAE.  Marland Kitchen Hyperlipidemia   . Hypertension   . Hyponatremia 09/2012   Associated with postural hypotension and confusion  . Insomnia   . Myocardial infarction ([place]) 2003   hx of   . Renal artery stenosis (HCC)    bilateral;  s/p R RA stent 2011  . Seizure ([place]) 2015   unknow etiology and year of seizure???  . Syncope and collapse 08/31/2016  . Systolic heart failure (HCC)    CHRONIC  . TIA (transient ischemic attack)    Past Surgical History:  Procedure Laterality Date  . CARDIAC CATHETERIZATION  2003   revdeling severe three-vessel  disease  . CORONARY ARTERY BYPASS GRAFT     LIMA to LAD, SVG to diagonal, SVG to RCA, and SVG to circumflex  . POLYPECTOMY     colon  . RENAL ARTERY STENT  01-28-10     Current Meds  Medication Sig  . aspirin 81 MG tablet Take 81 mg by mouth at bedtime.   Marland Kitchen CALCIUM-MAGNESIUM-ZINC PO Take 1 tablet by mouth daily.  . cholecalciferol (VITAMIN D) 1000 UNITS tablet Take 2,000 Units by mouth daily.  . clotrimazole (LOTRIMIN) 1 % cream Apply 1 application topically 2 (two) times daily. Use for about 2 weeks (Patient taking differently: Apply 1 application topically 2 (two) times daily as needed (rash). Use for about 2 weeks)  . ENTRESTO 49-51 MG TAKE 1 TABLET BY MOUTH TWICE A DAY  . ezetimibe (ZETIA) 10 MG tablet TAKE 1/2 TABLET BY MOUTH ONCE DAILY  . Melatonin 3 MG TBDP Take 1 tablet by mouth at bedtime as needed (sleep). Reported on 04/29/2016  . nitroGLYCERIN (NITROSTAT) 0.4 MG SL tablet DISSOLVE 1 TABLET UNDER THE TONGUE AS NEEDED FOR CHEST PAIN. MAY REPEAT IN 5  MINUTES AS DIRECTED. ER IF NO BETTER.  . Omega-3 Fatty Acids (FISH OIL) 1200 MG CPDR Take 1 capsule by mouth daily.  . vitamin C (ASCORBIC ACID) 500 MG tablet Take 500 mg by mouth daily.     Allergies:   Lisinopril, Zithromax [azithromycin], and Niacin   Social History   Tobacco Use  . Smoking status: Never Smoker  . Smokeless tobacco: Never Used  Substance Use Topics  . Alcohol use: No  . Drug use: No     Family Hx: The patient's family history includes Colon cancer in an other family member; Diabetes in his mother; Heart attack (age of onset: 93) in his father; Stroke in his mother.  ROS:   Please see the history of present illness.     All other systems reviewed and are negative.   Labs/Other Tests and Data Reviewed:    Recent Labs: 01/20/2019: TSH 2.258 01/21/2019: BUN 20; Creatinine, Ser 1.02; Hemoglobin 13.1; Platelets 131; Potassium 4.0; Sodium 135   Recent Lipid Panel Lab Results  Component Value Date/Time    CHOL 224 (H) 04/05/2017 05:43 AM   TRIG 88 04/05/2017 05:43 AM   HDL 38 (L) 04/05/2017 05:43 AM   CHOLHDL 5.9 04/05/2017 05:43 AM   LDLCALC 168 (H) 04/05/2017 05:43 AM    Wt Readings from Last 3 Encounters:  11/26/19 148 lb 6.4 oz (67.3 kg)  07/10/19 150 lb 6.4 oz (68.2 kg)  02/05/19 145 lb (65.8 kg)     Exam:    Vital Signs:  BP (!) 134/54   Pulse 77   Ht _0  (1.626 m)   Wt 148 lb 6.4 oz (67.3 kg)   BMI 25.47 kg/m    Well nourished, well developed male in no  acute distress.   ASSESSMENT & PLAN:    1.  Shortness of breath/dyspnea on exertion -no shortness of breath today.  Lower extremity swelling is described as decreased but still present below knee.  Wife describes patient's feet as was swollen. Heart healthy low-sodium diet-salty 6 given Increase physical activity as tolerated Lower extremity support stockings Continue to elevate extremities when not active-to chest height Continue to monitor  Coronary artery disease-no chest pain today.  Status post CABG x3 2003.  LVEF 45 to 50% on last echocardiogram mild hypokinesis, mild pulmonic regurgitation. Continue aspirin 81 mg tablet daily Continue Entresto 49-50 twice daily Continue nitroglycerin 0.4 mg sublingual tablet as needed Heart healthy low-sodium diet-salty 6 Increase physical activity as tolerated  Essential hypertension-BP today 134/54.  Well-controlled Heart healthy low-sodium diet Increase physical activity as tolerated   Hyperlipidemia-.LDL 65 01/06/2016 Continue Zetia 5 mg daily Followed by PCP    COVID-19 Education: The signs and symptoms of COVID-19 were discussed with the patient and how to seek care for testing (follow up with PCP or arrange E-visit).  The importance of social distancing was discussed today.  Patient Risk:   After full review of this patients clinical status, I feel that they are at least moderate risk at this time.  Time:   Today, I have spent 15 minutes with the patient  with telehealth technology discussing lower extremity edema, low-salt diet, heart failure, and treatment.     Medication Adjustments/Labs and Tests Ordered: Current medicines are reviewed at length with the patient today.  Concerns regarding medicines are outlined above.   Tests Ordered: No orders of the defined types were placed in this encounter.  Medication Changes: No orders of the defined types were placed in this  encounter.   Disposition: Disposition: Follow-up virtually in 1 week.  Jossie Ng. [facility] [place] [address] Office [phone] Fax [phone]

## 2019-11-26 ENCOUNTER — Telehealth (INDEPENDENT_AMBULATORY_CARE_PROVIDER_SITE_OTHER): Payer: Medicare HMO | Admitting: General Practice

## 2019-11-26 VITALS — BP 134/54 | HR 77 | Ht 64.0 in | Wt 148.4 lb

## 2019-11-26 DIAGNOSIS — I10 Essential (primary) hypertension: Secondary | ICD-10-CM

## 2019-11-26 DIAGNOSIS — R0600 Dyspnea, unspecified: Secondary | ICD-10-CM | POA: Diagnosis not present

## 2019-11-26 DIAGNOSIS — E782 Mixed hyperlipidemia: Secondary | ICD-10-CM

## 2019-11-26 DIAGNOSIS — R0609 Other forms of dyspnea: Secondary | ICD-10-CM

## 2019-11-26 DIAGNOSIS — I2510 Atherosclerotic heart disease of native coronary artery without angina pectoris: Secondary | ICD-10-CM

## 2019-11-26 NOTE — Telephone Encounter (Signed)
Spoke with pt's wife and after review pt had virtual visit with Coletta Memos NP this AM see note .Adonis Housekeeper

## 2019-11-26 NOTE — Patient Instructions (Signed)
Medication Instructions:  CONTINUE ENTRESTO If you need a refill on your cardiac medications before your next appointment, please call your pharmacy.  Special Instructions: PLEASE READ AN FOLLOW SALTY 6-ATTACHED  PLEASE WEAR COMPRESSION STOCKINGS DAILY AND OFF AT BEDTIME. Compression stockings are elastic socks that squeeze the legs. They help to increase blood flow to the legs and to decrease swelling in the legs from fluid retention, and reduce the chance of developing blood clots in the lower legs. PLEASE ELEVATE EXTREMITIES WHEN SITTING  PLEASE READ AND FOLLOW LOW SALT DIET-ATTACHED  Follow-Up: IN IN 1 WEEK WITH JESSE CLEAVER, FNPVirtual Visit  You may see Minus Breeding, MD or one of the following Advanced Practice Providers on your designated Care Team:  Rosaria Ferries, PA-C  Jory Sims, DNP, ANP Cadence Kathlen Mody, NP.    At [facility], you and your health needs are our priority.  As part of our continuing mission to provide you with exceptional heart care, we have created designated Provider Care Teams.  These Care Teams include your primary Cardiologist (physician) and Advanced Practice Providers (APPs -  Physician Assistants and Nurse Practitioners) who all work together to provide you with the care you need, when you need it.  Thank you for choosing [facility] at [facility]!!      Happy Holidays!!  Low-Sodium Eating Plan Sodium, which is an element that makes up salt, helps you maintain a healthy balance of fluids in your body. Too much sodium can increase your blood pressure and cause fluid and waste to be held in your body. Your health care provider or dietitian may recommend following this plan if you have high blood pressure (hypertension), kidney disease, liver disease, or heart failure. Eating less sodium can help lower your blood pressure, reduce swelling, and protect your heart, liver, and kidneys. What are tips for following this plan? General guidelines  Most  people on this plan should limit their sodium intake to 1,500-2,000 mg (milligrams) of sodium each day. Reading food labels   The Nutrition Facts label lists the amount of sodium in one serving of the food. If you eat more than one serving, you must multiply the listed amount of sodium by the number of servings.  Choose foods with less than 140 mg of sodium per serving.  Avoid foods with 300 mg of sodium or more per serving. Shopping  Look for lower-sodium products, often labeled as "low-sodium" or "no salt added."  Always check the sodium content even if foods are labeled as "unsalted" or "no salt added".  Buy fresh foods. ? Avoid canned foods and premade or frozen meals. ? Avoid canned, cured, or processed meats  Buy breads that have less than 80 mg of sodium per slice. Cooking  Eat more home-cooked food and less restaurant, buffet, and fast food.  Avoid adding salt when cooking. Use salt-free seasonings or herbs instead of table salt or sea salt. Check with your health care provider or pharmacist before using salt substitutes.  Cook with plant-based oils, such as canola, sunflower, or olive oil. Meal planning  When eating at a restaurant, ask that your food be prepared with less salt or no salt, if possible.  Avoid foods that contain MSG (monosodium glutamate). MSG is sometimes added to [place] food, bouillon, and some canned foods. What foods are recommended? The items listed may not be a complete list. Talk with your dietitian about what dietary choices are best for you. Grains Low-sodium cereals, including oats, puffed wheat and rice, and  shredded wheat. Low-sodium crackers. Unsalted rice. Unsalted pasta. Low-sodium bread. Whole-grain breads and whole-grain pasta. Vegetables Fresh or frozen vegetables. "No salt added" canned vegetables. "No salt added" tomato sauce and paste. Low-sodium or reduced-sodium tomato and vegetable juice. Fruits Fresh, frozen, or canned fruit.  Fruit juice. Meats and other protein foods Fresh or frozen (no salt added) meat, poultry, seafood, and fish. Low-sodium canned tuna and salmon. Unsalted nuts. Dried peas, beans, and lentils without added salt. Unsalted canned beans. Eggs. Unsalted nut butters. Dairy Milk. Soy milk. Cheese that is naturally low in sodium, such as ricotta cheese, fresh mozzarella, or Swiss cheese Low-sodium or reduced-sodium cheese. Cream cheese. Yogurt. Fats and oils Unsalted butter. Unsalted margarine with no trans fat. Vegetable oils such as canola or olive oils. Seasonings and other foods Fresh and dried herbs and spices. Salt-free seasonings. Low-sodium mustard and ketchup. Sodium-free salad dressing. Sodium-free light mayonnaise. Fresh or refrigerated horseradish. Lemon juice. Vinegar. Homemade, reduced-sodium, or low-sodium soups. Unsalted popcorn and pretzels. Low-salt or salt-free chips. What foods are not recommended? The items listed may not be a complete list. Talk with your dietitian about what dietary choices are best for you. Grains Instant hot cereals. Bread stuffing, pancake, and biscuit mixes. Croutons. Seasoned rice or pasta mixes. Noodle soup cups. Boxed or frozen macaroni and cheese. Regular salted crackers. Self-rising flour. Vegetables Sauerkraut, pickled vegetables, and relishes. Olives. [place] fries. Onion rings. Regular canned vegetables (not low-sodium or reduced-sodium). Regular canned tomato sauce and paste (not low-sodium or reduced-sodium). Regular tomato and vegetable juice (not low-sodium or reduced-sodium). Frozen vegetables in sauces. Meats and other protein foods Meat or fish that is salted, canned, smoked, spiced, or pickled. Bacon, ham, sausage, hotdogs, corned beef, chipped beef, packaged lunch meats, salt pork, jerky, pickled herring, anchovies, regular canned tuna, sardines, salted nuts. Dairy Processed cheese and cheese spreads. Cheese curds. Blue cheese. Feta cheese. String  cheese. Regular cottage cheese. Buttermilk. Canned milk. Fats and oils Salted butter. Regular margarine. Ghee. Bacon fat. Seasonings and other foods Onion salt, garlic salt, seasoned salt, table salt, and sea salt. Canned and packaged gravies. Worcestershire sauce. Tartar sauce. Barbecue sauce. Teriyaki sauce. Soy sauce, including reduced-sodium. Steak sauce. Fish sauce. Oyster sauce. Cocktail sauce. Horseradish that you find on the shelf. Regular ketchup and mustard. Meat flavorings and tenderizers. Bouillon cubes. Hot sauce and Tabasco sauce. Premade or packaged marinades. Premade or packaged taco seasonings. Relishes. Regular salad dressings. Salsa. Potato and tortilla chips. Corn chips and puffs. Salted popcorn and pretzels. Canned or dried soups. Pizza. Frozen entrees and pot pies. Summary  Eating less sodium can help lower your blood pressure, reduce swelling, and protect your heart, liver, and kidneys.  Most people on this plan should limit their sodium intake to 1,500-2,000 mg (milligrams) of sodium each day.  Canned, boxed, and frozen foods are high in sodium. Restaurant foods, fast foods, and pizza are also very high in sodium. You also get sodium by adding salt to food.  Try to cook at home, eat more fresh fruits and vegetables, and eat less fast food, canned, processed, or prepared foods. This information is not intended to replace advice given to you by your health care provider. Make sure you discuss any questions you have with your health care provider. Document Released: 05/28/2002 Document Revised: 11/18/2017 Document Reviewed: 11/29/2016 Elsevier Patient Education  2020 [employer].

## 2019-12-03 DIAGNOSIS — C61 Malignant neoplasm of prostate: Secondary | ICD-10-CM | POA: Diagnosis not present

## 2019-12-03 NOTE — Progress Notes (Signed)
Virtual Visit via Telephone Note   This visit type was conducted due to national recommendations for restrictions regarding the COVID-19 Pandemic (e.g. social distancing) in an effort to limit this patient's exposure and mitigate transmission in our community.  Due to his co-morbid illnesses, this patient is at least at moderate risk for complications without adequate follow up.  This format is felt to be most appropriate for this patient at this time.  The patient did not have access to video technology/had technical difficulties with video requiring transitioning to audio format only (telephone).  All issues noted in this document were discussed and addressed.  No physical exam could be performed with this format.  Please refer to the patient's chart for his  consent to telehealth for [facility].  Evaluation Performed:  Follow-up visit  This visit type was conducted due to national recommendations for restrictions regarding the COVID-19 Pandemic (e.g. social distancing).  This format is felt to be most appropriate for this patient at this time.  All issues noted in this document were discussed and addressed.  No physical exam was performed (except for noted visual exam findings with Video Visits).  Please refer to the patient's chart (MyChart message for video visits and phone note for telephone visits) for the patient's consent to telehealth for [facility]  Date:  12/04/2019   ID:  Victor Waters, DOB 1926-04-11, MRN [id number]  Patient Location:  [place] [address]   Provider location:      [address] Office [phone] Fax [phone]   PCP:  Darreld Mclean, MD  Cardiologist:  Minus Breeding, MD  Electrophysiologist:  None   Chief Complaint:  Follow up-increased work of breathing and lower extremity edema  History of Present Illness:    Victor Waters is a 85 y.o. male who presents  via audio/video conferencing for a telehealth visit today.  Patient verified DOB and address.  The patient does not symptoms concerning for COVID-19 infection (fever, chills, cough, or new SHORTNESS OF BREATH).   Victor Waters 85 year old male seen today via virtual platform has a past medical history of essential hypertension, coronary atherosclerosis, hypertension, syncope and collapse, chronic systolic heart failure, hyperlipidemia, anxiety, bradycardia, dyspnea, and TIA.  He also has CAD/CABGx3 2003 (LIMA to LAD, SVG to diagonal, SVG to RCA, and SVG to circumflex).  His most recent echocardiogram from 01/20/2019 showed an LVEF of 45 to 50% mild LVH, mild global hypokinesis pseudonormal diastolic filling, and mild pulmonic valve regurgitation.  He was last seen by Dr. Percival Spanish on 07/10/2019.  During that time he was doing well.  He called the cardiology triage nurse on 11/23/2019 and indicated that he had noticed some increased shortness of breath and lower extremity edema.  I contacted him and his wife via virtual platform on 11/26/2019.  During that time his wife indicated that he continued to be very active and was getting up around 6 AM in the morning doing chores around their house.  She indicated he was not having any shortness of breath however, he did have some lower extremity swelling.  She stated that the swelling had gotten somewhat better since I contacted the appointment on 11/23/2019.  She also indicated that they were trying to follow a low-sodium diet.  She did indicate that he did have some increased work of breathing with more vigorous activities but normal breathing with regular daily activities.  I recommended  continuing to follow a low-sodium diet (salty 6 sheet given), lower extremity support stockings, elevating extremities when not active, and weighing daily.  He is again seen virtually today with his wife and states he feels well today.  He states he was up at 6 AM this morning  and walked her family dog.  During the contact he was helping his wife in the kitchen peel potatoes.  They continue to try to eat a low-sodium diet.  They have begun to weigh daily.  His weight today is 142.4 down from 148.4 on 11/26/2019.  I will send in the salty 6, a weight log, and have him follow-up in 3 months.  He denies chest pain, shortness of breath, lower extremity edema, fatigue, palpitations, melena, hematuria, hemoptysis, diaphoresis, weakness, presyncope, syncope, orthopnea, and PND.   Prior CV studies:   The following studies were reviewed today:  Echocardiogram 01/2019 IMPRESSIONS  1. The left ventricle has mildly reduced systolic function of 88-50%. The cavity size is mildly increased. There is mild concentric left ventricular wall thickness. Echo evidence of pseudonormal diastolic filling patterns. 2. Mild global hypokinesis. 3. Normal left atrial size. 4. Normal right atrial size. 5. The mitral valve normal in structure. There is mild mitral annular calcification present. Regurgitation is trivial by color flow Doppler. 6. Normal tricuspid valve. 7. The aortic valve tricuspid. There is severe thickening and severe calcification of the aortic valve. 8. Pulmonic valve regurgitation is mild by color flow Doppler. 9. The inferior vena cava was normal in size <50% respiratory variablity. 10. No atrial level shunt detected by color flow Doppler.  Past Medical History:  Diagnosis Date  . Anxiety   . Coronary artery disease    a. s/p CABG;  b. LHC (2/11):  LM 30-40, pLAD 95-99 then 80 and 90, mCFX 90, pRCA occluded, S-dRCA ok, S-D1 ok, S-OM patent with 50, L-LAD ok.  Med Rx recommended.  c.  Myoview (5/13):  Low risk with small fixed inf defect c/w scar, no ischemia, EF 48%.  d.  Echo (6/13):  EF 55-60%, inf HK, Gr 1 DD, MAC;  e. Lexiscan Myoview (10/14):  Low risk, EF 38%, inferobasal infarct, no ischemia.  . Fatigue   . Hx of echocardiogram    Echo (10/14):  EF  55-60%, Gr 1DD, MAC, mild MR, mild LAE.  Marland Kitchen Hyperlipidemia   . Hypertension   . Hyponatremia 09/2012   Associated with postural hypotension and confusion  . Insomnia   . Myocardial infarction ([place]) 2003   hx of   . Renal artery stenosis (HCC)    bilateral;  s/p R RA stent 2011  . Seizure ([place]) 2015   unknow etiology and year of seizure???  . Syncope and collapse 08/31/2016  . Systolic heart failure (HCC)    CHRONIC  . TIA (transient ischemic attack)    Past Surgical History:  Procedure Laterality Date  . CARDIAC CATHETERIZATION  2003   revdeling severe three-vessel disease  . CORONARY ARTERY BYPASS GRAFT     LIMA to LAD, SVG to diagonal, SVG to RCA, and SVG to circumflex  . POLYPECTOMY     colon  . RENAL ARTERY STENT  01-28-10     Current Meds  Medication Sig  . aspirin 81 MG tablet Take 81 mg by mouth at bedtime.   Marland Kitchen CALCIUM-MAGNESIUM-ZINC PO Take 1 tablet by mouth daily.  . cholecalciferol (VITAMIN D) 1000 UNITS tablet Take 2,000 Units by mouth daily.  . clotrimazole (LOTRIMIN) 1 % cream  Apply 1 application topically 2 (two) times daily. Use for about 2 weeks (Patient taking differently: Apply 1 application topically 2 (two) times daily as needed (rash). Use for about 2 weeks)  . ENTRESTO 49-51 MG TAKE 1 TABLET BY MOUTH TWICE A DAY  . ezetimibe (ZETIA) 10 MG tablet TAKE 1/2 TABLET BY MOUTH ONCE DAILY  . Melatonin 3 MG TBDP Take 1 tablet by mouth at bedtime as needed (sleep). Reported on 04/29/2016  . nitroGLYCERIN (NITROSTAT) 0.4 MG SL tablet DISSOLVE 1 TABLET UNDER THE TONGUE AS NEEDED FOR CHEST PAIN. MAY REPEAT IN 5 MINUTES AS DIRECTED. ER IF NO BETTER.  . Omega-3 Fatty Acids (FISH OIL) 1200 MG CPDR Take 1 capsule by mouth daily.  . vitamin C (ASCORBIC ACID) 500 MG tablet Take 500 mg by mouth daily.     Allergies:   Lisinopril, Zithromax [azithromycin], and Niacin   Social History   Tobacco Use  . Smoking status: Never Smoker  . Smokeless tobacco: Never Used   Substance Use Topics  . Alcohol use: No  . Drug use: No     Family Hx: The patient's family history includes Colon cancer in an other family member; Diabetes in his mother; Heart attack (age of onset: 42) in his father; Stroke in his mother.  ROS:   Please see the history of present illness.     All other systems reviewed and are negative.   Labs/Other Tests and Data Reviewed:    Recent Labs: 01/20/2019: TSH 2.258 01/21/2019: BUN 20; Creatinine, Ser 1.02; Hemoglobin 13.1; Platelets 131; Potassium 4.0; Sodium 135   Recent Lipid Panel Lab Results  Component Value Date/Time   CHOL 224 (H) 04/05/2017 05:43 AM   TRIG 88 04/05/2017 05:43 AM   HDL 38 (L) 04/05/2017 05:43 AM   CHOLHDL 5.9 04/05/2017 05:43 AM   LDLCALC 168 (H) 04/05/2017 05:43 AM    Wt Readings from Last 3 Encounters:  12/04/19 142 lb 6.4 oz (64.6 kg)  11/26/19 148 lb 6.4 oz (67.3 kg)  07/10/19 150 lb 6.4 oz (68.2 kg)     Exam:    Vital Signs:  BP 135/81   Pulse 94   Ht _0  (1.626 m)   Wt 142 lb 6.4 oz (64.6 kg)   BMI 24.44 kg/m    Well nourished, well developed male in no  acute distress.   ASSESSMENT & PLAN:    1.  Shortness of breath/dyspnea on exertion -no shortness of breath today.  No lower extremity swelling today. Wife describes patient as active and stated he was walking the dog this morning at 6 AM.  Heart healthy low-sodium diet-salty 6 given Increase physical activity as tolerated Lower extremity support stockings Continue to elevate extremities when not active-to chest height Daily weights.  Coronary artery disease-no chest pain today.  Status post CABG x3 2003.  LVEF 45 to 50% on last echocardiogram mild hypokinesis, mild pulmonic regurgitation. Continue aspirin 81 mg tablet daily Continue Entresto 49-50 twice daily Continue nitroglycerin 0.4 mg sublingual tablet as needed Heart healthy low-sodium diet-salty 6 Increase physical activity as tolerated  Essential hypertension-BP today  135/81.  Well-controlled at home Heart healthy low-sodium-diet salty 6 Increase physical activity as tolerated   Hyperlipidemia-.LDL 65 01/06/2016 Continue Zetia 5 mg daily Followed by PCP   COVID-19 Education: The signs and symptoms of COVID-19 were discussed with the patient and how to seek care for testing (follow up with PCP or arrange E-visit).  The importance of social distancing was discussed  today.  Patient Risk:   After full review of this patients clinical status, I feel that they are at least moderate risk at this time.  Time:   Today, I have spent 15 minutes with the patient with telehealth technology discussing weight, blood pressure, heart failure symptoms, and medication.     Medication Adjustments/Labs and Tests Ordered: Current medicines are reviewed at length with the patient today.  Concerns regarding medicines are outlined above.   Tests Ordered: No orders of the defined types were placed in this encounter.  Medication Changes: No orders of the defined types were placed in this encounter.   Disposition:  in 3 month(s) with Dr. Percival Spanish.  Signed, Jossie Ng. [facility] [address] Office [phone] Fax [phone]

## 2019-12-04 ENCOUNTER — Telehealth (INDEPENDENT_AMBULATORY_CARE_PROVIDER_SITE_OTHER): Payer: Medicare HMO | Admitting: General Practice

## 2019-12-04 VITALS — BP 135/81 | HR 94 | Ht 64.0 in | Wt 142.4 lb

## 2019-12-04 DIAGNOSIS — I110 Hypertensive heart disease with heart failure: Secondary | ICD-10-CM

## 2019-12-04 DIAGNOSIS — E785 Hyperlipidemia, unspecified: Secondary | ICD-10-CM

## 2019-12-04 DIAGNOSIS — R0609 Other forms of dyspnea: Secondary | ICD-10-CM | POA: Diagnosis not present

## 2019-12-04 DIAGNOSIS — I2510 Atherosclerotic heart disease of native coronary artery without angina pectoris: Secondary | ICD-10-CM | POA: Diagnosis not present

## 2019-12-04 DIAGNOSIS — E782 Mixed hyperlipidemia: Secondary | ICD-10-CM

## 2019-12-04 DIAGNOSIS — I10 Essential (primary) hypertension: Secondary | ICD-10-CM

## 2019-12-04 NOTE — Patient Instructions (Addendum)
Special Instructions: PLEASE CONTINUE TO FOLLOW SALTY 6 HANDOUT GIVEN TO YOU AT YOUR LAST VISIT  CONTINUE TO LOG DAILY WEIGHTS-LOG ATTACHED  Reduce your risk of getting COVID-19 With your heart disease it is especially important for people at increased risk of severe illness from COVID-19, and those who live with them, to protect themselves from getting COVID-19. The best way to protect yourself and to help reduce the spread of the virus that causes COVID-19 is to: Marland Kitchen Limit your interactions with other people as much as possible. . Take precautions to prevent getting COVID-19 when you do interact with others. .  If you start feeling sick and think you may have COVID-19, get in touch with your healthcare provider within 24 hours.  Follow-Up: IN 3 months (MARCH 9th @120PM ) In Person with Minus Breeding, MD.    At [facility], you and your health needs are our priority.  As part of our continuing mission to provide you with exceptional heart care, we have created designated Provider Care Teams.  These Care Teams include your primary Cardiologist (physician) and Advanced Practice Providers (APPs -  Physician Assistants and Nurse Practitioners) who all work together to provide you with the care you need, when you need it.  Thank you for choosing [facility] at [facility]!!     Happy Holidays!!   DATE TIME WT DATE TIME WT DATE TIME  WT

## 2019-12-27 ENCOUNTER — Ambulatory Visit: Payer: Medicare HMO | Admitting: Cardiology

## 2020-01-14 DIAGNOSIS — C61 Malignant neoplasm of prostate: Secondary | ICD-10-CM | POA: Diagnosis not present

## 2020-01-21 ENCOUNTER — Other Ambulatory Visit: Payer: Self-pay

## 2020-01-21 ENCOUNTER — Encounter: Payer: Self-pay | Admitting: Podiatry

## 2020-01-21 ENCOUNTER — Telehealth: Payer: Self-pay | Admitting: Family Medicine

## 2020-01-21 ENCOUNTER — Ambulatory Visit: Payer: Medicare HMO | Admitting: Podiatry

## 2020-01-21 DIAGNOSIS — M79674 Pain in right toe(s): Secondary | ICD-10-CM | POA: Diagnosis not present

## 2020-01-21 DIAGNOSIS — B351 Tinea unguium: Secondary | ICD-10-CM | POA: Diagnosis not present

## 2020-01-21 DIAGNOSIS — M79675 Pain in left toe(s): Secondary | ICD-10-CM | POA: Diagnosis not present

## 2020-01-21 DIAGNOSIS — R002 Palpitations: Secondary | ICD-10-CM | POA: Diagnosis not present

## 2020-01-21 DIAGNOSIS — L608 Other nail disorders: Secondary | ICD-10-CM

## 2020-01-21 NOTE — Telephone Encounter (Signed)
Okay, done

## 2020-01-21 NOTE — Telephone Encounter (Signed)
Pt has appt today to get toenails cut down at [place]. Wife is requesting for referral.

## 2020-01-21 NOTE — Progress Notes (Signed)
Complaint:  Visit Type: Patient presents  to my office for  preventative foot care services. Patient states" my nails have grown long and thick and become painful to walk and wear shoes" . The patient presents for preventative foot care services.  Podiatric Exam: Vascular: dorsalis pedis and posterior tibial pulses are palpable bilateral. Capillary return is immediate. Temperature gradient is WNL. Skin turgor WNL  Sensorium: Normal Semmes Weinstein monofilament test. Normal tactile sensation bilaterally. Nail Exam: Pt has thick disfigured discolored nails with subungual debris noted bilateral entire nail hallux through fifth toenails Ulcer Exam: There is no evidence of ulcer or pre-ulcerative changes or infection. Orthopedic Exam: Muscle tone and strength are WNL. No limitations in general ROM. No crepitus or effusions noted. Foot type and digits show no abnormalities. Bony prominences are unremarkable. Skin: No Porokeratosis. No infection or ulcers  Diagnosis:  Onychomycosis, , Pain in right toe, pain in left toes  Treatment & Plan Procedures and Treatment: Consent by patient was obtained for treatment procedures.   Debridement of mycotic and hypertrophic toenails, 1 through 5 bilateral and clearing of subungual debris. No ulceration, no infection noted.  Return Visit-Office Procedure: Patient instructed to return to the office for a follow up visit 3 months for continued evaluation and treatment.    Gardiner Barefoot DPM

## 2020-02-24 ENCOUNTER — Encounter: Payer: Self-pay | Admitting: Cardiology

## 2020-02-24 DIAGNOSIS — Z7189 Other specified counseling: Secondary | ICD-10-CM | POA: Insufficient documentation

## 2020-02-24 NOTE — Progress Notes (Addendum)
Cardiology Office Note   Date:  02/26/2020   ID:  TRACI PLEMONS, DOB 1926-10-04, MRN [id number]  PCP:  Darreld Mclean, MD  Cardiologist:   Minus Breeding, MD   Chief Complaint  Patient presents with  . Coronary Artery Disease      History of Present Illness: Victor Waters is a 86 y.o. male who presents for follow up of  CAD/CABGx3 2003 (LIMA to LAD, SVG to diagonal, SVG to RCA, and SVG to circumflex).  His most recent echocardiogram from 01/20/2019 showed an LVEF of 45 to 50% mild LVH, mild global hypokinesis pseudonormal diastolic filling, and mild pulmonic valve regurgitation.   Since I last saw him he has had no acute problems.  He has had dyspnea on some leg swelling in the past but is not really experiencing this.  He denies any chest pressure, neck or arm discomfort.  He is not noticing any palpitations, presyncope or syncope.  His wife says he had some falls because he turns sometimes too quickly and he is wearing socks.  He is not having any loss of consciousness however.    Past Medical History:  Diagnosis Date  . Anxiety   . Coronary artery disease    a. s/p CABG;  b. LHC (2/11):  LM 30-40, pLAD 95-99 then 80 and 90, mCFX 90, pRCA occluded, S-dRCA ok, S-D1 ok, S-OM patent with 50, L-LAD ok.  Med Rx recommended.  c.  Myoview (5/13):  Low risk with small fixed inf defect c/w scar, no ischemia, EF 48%.  d.  Echo (6/13):  EF 55-60%, inf HK, Gr 1 DD, MAC;  e. Lexiscan Myoview (10/14):  Low risk, EF 38%, inferobasal infarct, no ischemia.  . Hyperlipidemia   . Hypertension   . Hyponatremia 09/2012   Associated with postural hypotension and confusion  . Insomnia   . Myocardial infarction ([place]) 2003   hx of   . Renal artery stenosis (HCC)    bilateral;  s/p R RA stent 2011  . Seizure ([place]) 2015   unknow etiology and year of seizure???  . Syncope and collapse 08/31/2016  . Systolic heart failure (HCC)    CHRONIC  . TIA (transient ischemic attack)     Past  Surgical History:  Procedure Laterality Date  . CARDIAC CATHETERIZATION  2003   revdeling severe three-vessel disease  . CORONARY ARTERY BYPASS GRAFT     LIMA to LAD, SVG to diagonal, SVG to RCA, and SVG to circumflex  . POLYPECTOMY     colon  . RENAL ARTERY STENT  01-28-10     Current Outpatient Medications  Medication Sig Dispense Refill  . aspirin 81 MG tablet Take 81 mg by mouth at bedtime.     Marland Kitchen CALCIUM-MAGNESIUM-ZINC PO Take 1 tablet by mouth daily.    . calcium-vitamin D (OSCAL WITH D) 500-200 MG-UNIT TABS tablet Take by mouth.    . cholecalciferol (VITAMIN D) 1000 UNITS tablet Take 2,000 Units by mouth daily.    . clotrimazole (LOTRIMIN) 1 % cream Apply 1 application topically 2 (two) times daily. Use for about 2 weeks (Patient taking differently: Apply 1 application topically 2 (two) times daily as needed (rash). Use for about 2 weeks) 30 g 0  . ENTRESTO 49-51 MG TAKE 1 TABLET BY MOUTH TWICE A DAY 60 tablet 5  . ezetimibe (ZETIA) 10 MG tablet TAKE 1/2 TABLET BY MOUTH ONCE DAILY 45 tablet 3  . FLUZONE HIGH-DOSE QUADRIVALENT 0.7 ML SUSY     .  Melatonin 3 MG TBDP Take 1 tablet by mouth at bedtime as needed (sleep). Reported on 04/29/2016    . nitroGLYCERIN (NITROSTAT) 0.4 MG SL tablet DISSOLVE 1 TABLET UNDER THE TONGUE AS NEEDED FOR CHEST PAIN. MAY REPEAT IN 5 MINUTES AS DIRECTED. ER IF NO BETTER. 25 tablet 1  . Omega-3 Fatty Acids (FISH OIL) 1200 MG CPDR Take 1 capsule by mouth daily.    Marland Kitchen SHINGRIX injection     . tamsulosin (FLOMAX) 0.4 MG CAPS capsule Take by mouth.    . vitamin C (ASCORBIC ACID) 500 MG tablet Take 500 mg by mouth daily.     No current facility-administered medications for this visit.    Allergies:   Lisinopril, Zithromax [azithromycin], and Niacin    ROS:  Please see the history of present illness.   Otherwise, review of systems are positive for none.   All other systems are reviewed and negative.    PHYSICAL EXAM: VS:  BP 122/62   Pulse (!) 51   Temp  97.6 F (36.4 C)   Resp (!) 21   Ht _0  (1.626 m)   Wt 148 lb 12.8 oz (67.5 kg)   SpO2 97%   BMI 25.54 kg/m  , BMI Body mass index is 25.54 kg/m. GENERAL:  Well appearing NECK:  No jugular venous distention, waveform within normal limits, carotid upstroke brisk and symmetric, no bruits, no thyromegaly LUNGS:  Clear to auscultation bilaterally CHEST:  Well healed sternotomy scar. HEART:  PMI not displaced or sustained,S1 and S2 within normal limits, no S3, no S4, no clicks, no rubs, 3 out of 6 apical systolic murmur radiating slightly at the outflow tract, no diastolic murmurs ABD:  Flat, positive bowel sounds normal in frequency in pitch, no bruits, no rebound, no guarding, no midline pulsatile mass, no hepatomegaly, no splenomegaly EXT:  2 plus pulses throughout, no edema, no cyanosis no clubbing    EKG:  EKG is ordered today. The ekg ordered today demonstrates normal sinus rhythm, rate 83, axis within normal limits, first-degree AV block, intervals within normal limits, premature ventricular complexes in a bigeminal pattern.   Recent Labs: No results found for requested labs within last 8760 hours.    Lipid Panel    Component Value Date/Time   CHOL 224 (H) 04/05/2017 0543   TRIG 88 04/05/2017 0543   HDL 38 (L) 04/05/2017 0543   CHOLHDL 5.9 04/05/2017 0543   VLDL 18 04/05/2017 0543   LDLCALC 168 (H) 04/05/2017 0543      Wt Readings from Last 3 Encounters:  02/26/20 148 lb 12.8 oz (67.5 kg)  12/04/19 142 lb 6.4 oz (64.6 kg)  11/26/19 148 lb 6.4 oz (67.3 kg)      Other studies Reviewed: Additional studies/ records that were reviewed today include: None. Review of the above records demonstrates:  Please see elsewhere in the note.     ASSESSMENT AND PLAN:  Shortness of breath/dyspnea on exertion:   He seems to be not complaining of this today.  He gets around slowly at home with his advanced age and he is in no distress.  No further cardiovascular testing.    Coronary artery disease: The patient has no chest discomfort.  No change in therapy.  He does need some routine blood work to include chemistry and CBC.  Though is not fasting I will check a lipid profile.  Essential hypertension: Blood pressure is well controlled as above.  No change in therapy.   Hyperlipidemia: We will  check a lipid profile today.  For now he will continue the meds as listed.   Murmur: I suspect this is related to the significant aortic valve calcification he has.  There was no significant gradient noted last year on echo.  No further work-up.  PVCs: He has bigeminy but is not noticing these.  No change in therapy.  Covid education: They have not yet gotten the vaccination.  We talked about this and they will sign up today through [facility].   Current medicines are reviewed at length with the patient today.  The patient does not have concerns regarding medicines.  The following changes have been made:  no change  Labs/ tests ordered today include:   Orders Placed This Encounter  Procedures  . CBC  . Comprehensive metabolic panel  . Lipid panel  . TSH  . EKG 12-Lead     Disposition:   FU with me in one year.     Signed, Minus Breeding, MD  02/26/2020 2:03 PM    [place]

## 2020-02-26 ENCOUNTER — Ambulatory Visit: Payer: Medicare HMO | Admitting: Cardiology

## 2020-02-26 ENCOUNTER — Other Ambulatory Visit: Payer: Self-pay

## 2020-02-26 ENCOUNTER — Encounter: Payer: Self-pay | Admitting: Cardiology

## 2020-02-26 VITALS — BP 122/62 | HR 51 | Temp 97.6°F | Resp 21 | Ht 64.0 in | Wt 148.8 lb

## 2020-02-26 DIAGNOSIS — I10 Essential (primary) hypertension: Secondary | ICD-10-CM

## 2020-02-26 DIAGNOSIS — Z7189 Other specified counseling: Secondary | ICD-10-CM | POA: Diagnosis not present

## 2020-02-26 DIAGNOSIS — I2510 Atherosclerotic heart disease of native coronary artery without angina pectoris: Secondary | ICD-10-CM

## 2020-02-26 DIAGNOSIS — E785 Hyperlipidemia, unspecified: Secondary | ICD-10-CM | POA: Diagnosis not present

## 2020-02-26 DIAGNOSIS — I493 Ventricular premature depolarization: Secondary | ICD-10-CM

## 2020-02-26 DIAGNOSIS — R011 Cardiac murmur, unspecified: Secondary | ICD-10-CM | POA: Diagnosis not present

## 2020-02-26 DIAGNOSIS — R0602 Shortness of breath: Secondary | ICD-10-CM | POA: Diagnosis not present

## 2020-02-26 NOTE — Patient Instructions (Signed)
Medication Instructions:  No Changes *If you need a refill on your cardiac medications before your next appointment, please call your pharmacy*  Lab Work: Your physician recommends that you return for lab work today (CBC, CMP, TSH, Lipids) If you have labs (blood work) drawn today and your tests are completely normal, you will receive your results only by: Marland Kitchen MyChart Message (if you have MyChart) OR . A paper copy in the mail If you have any lab test that is abnormal or we need to change your treatment, we will call you to review the results.  Testing/Procedures: None  Follow-Up: At [facility], you and your health needs are our priority.  As part of our continuing mission to provide you with exceptional heart care, we have created designated Provider Care Teams.  These Care Teams include your primary Cardiologist (physician) and Advanced Practice Providers (APPs -  Physician Assistants and Nurse Practitioners) who all work together to provide you with the care you need, when you need it.  We recommend signing up for the patient portal called "MyChart".  Sign up information is provided on this After Visit Summary.  MyChart is used to connect with patients for Virtual Visits (Telemedicine).  Patients are able to view lab/test results, encounter notes, upcoming appointments, etc.  Non-urgent messages can be sent to your provider as well.   To learn more about what you can do with MyChart, go to [email or web address].    Your next appointment:   1 year(s)  You will receive a reminder letter in the mail two months in advance. If you don't receive a letter, please call our office to schedule the follow-up appointment.  The format for your next appointment:   In Person  Provider:   Minus Breeding, MD

## 2020-02-27 LAB — COMPREHENSIVE METABOLIC PANEL
ALT: 12 IU/L (ref 0–44)
AST: 16 IU/L (ref 0–40)
Albumin/Globulin Ratio: 2.4 — ABNORMAL HIGH (ref 1.2–2.2)
Albumin: 3.9 g/dL (ref 3.5–4.6)
Alkaline Phosphatase: 59 IU/L (ref 39–117)
BUN/Creatinine Ratio: 23 (ref 10–24)
BUN: 27 mg/dL (ref 10–36)
Bilirubin Total: 0.6 mg/dL (ref 0.0–1.2)
CO2: 23 mmol/L (ref 20–29)
Calcium: 9.1 mg/dL (ref 8.6–10.2)
Chloride: 98 mmol/L (ref 96–106)
Creatinine, Ser: 1.17 mg/dL (ref 0.76–1.27)
GFR calc Af Amer: 63 mL/min/{1.73_m2} (ref >59)
GFR calc non Af Amer: 54 mL/min/{1.73_m2} — ABNORMAL LOW (ref >59)
Globulin, Total: 1.6 g/dL (ref 1.5–4.5)
Glucose: 133 mg/dL — ABNORMAL HIGH (ref 65–99)
Potassium: 4.7 mmol/L (ref 3.5–5.2)
Sodium: 134 mmol/L (ref 134–144)
Total Protein: 5.5 g/dL — ABNORMAL LOW (ref 6.0–8.5)

## 2020-02-27 LAB — CBC
Hematocrit: 37.6 % (ref 37.5–51.0)
Hemoglobin: 12.5 g/dL — ABNORMAL LOW (ref 13.0–17.7)
MCH: 30.3 pg (ref 26.6–33.0)
MCHC: 33.2 g/dL (ref 31.5–35.7)
MCV: 91 fL (ref 79–97)
Platelets: 147 10*3/uL — ABNORMAL LOW (ref 150–450)
RBC: 4.13 x10E6/uL — ABNORMAL LOW (ref 4.14–5.80)
RDW: 14.1 % (ref 11.6–15.4)
WBC: 4.8 10*3/uL (ref 3.4–10.8)

## 2020-02-27 LAB — TSH: TSH: 2.42 u[IU]/mL (ref 0.450–4.500)

## 2020-02-27 LAB — LIPID PANEL
Chol/HDL Ratio: 3.7 ratio (ref 0.0–5.0)
Cholesterol, Total: 168 mg/dL (ref 100–199)
HDL: 46 mg/dL (ref >39)
LDL Chol Calc (NIH): 108 mg/dL — ABNORMAL HIGH (ref 0–99)
Triglycerides: 76 mg/dL (ref 0–149)
VLDL Cholesterol Cal: 14 mg/dL (ref 5–40)

## 2020-03-13 ENCOUNTER — Ambulatory Visit: Payer: Medicare HMO | Attending: Internal Medicine

## 2020-03-13 DIAGNOSIS — Z23 Encounter for immunization: Secondary | ICD-10-CM

## 2020-03-13 NOTE — Progress Notes (Signed)
   Covid-19 Vaccination Clinic  Name:  Victor Waters    MRN: [id number] DOB: 03-Oct-1926  03/13/2020  [name] was observed post Covid-19 immunization for 15 minutes without incident. He was provided with Vaccine Information Sheet and instruction to access the V-Safe system.   [name] was instructed to call 911 with any severe reactions post vaccine: Marland Kitchen Difficulty breathing  . Swelling of face and throat  . A fast heartbeat  . A bad rash all over body  . Dizziness and weakness   Immunizations Administered    Name Date Dose VIS Date Route   Pfizer COVID-19 Vaccine 03/13/2020  3:17 PM 0.3 mL 11/30/2019 Intramuscular   Manufacturer: [employer]   Lot: [id number]   [place]: [id number]

## 2020-04-07 ENCOUNTER — Ambulatory Visit: Payer: Medicare HMO | Attending: Internal Medicine

## 2020-04-07 DIAGNOSIS — Z23 Encounter for immunization: Secondary | ICD-10-CM

## 2020-04-07 NOTE — Progress Notes (Signed)
   Covid-19 Vaccination Clinic  Name:  Victor Waters    MRN: [id number] DOB: 02-05-1926  04/07/2020  [name] was observed post Covid-19 immunization for 15 minutes without incident. He was provided with Vaccine Information Sheet and instruction to access the V-Safe system.   [name] was instructed to call 911 with any severe reactions post vaccine: Marland Kitchen Difficulty breathing  . Swelling of face and throat  . A fast heartbeat  . A bad rash all over body  . Dizziness and weakness   Immunizations Administered    Name Date Dose VIS Date Route   Pfizer COVID-19 Vaccine 04/07/2020  1:12 PM 0.3 mL 02/13/2019 Intramuscular   Manufacturer: [place]   Lot: [id number]   [place]: [id number]

## 2020-04-10 ENCOUNTER — Other Ambulatory Visit: Payer: Self-pay

## 2020-04-10 MED ORDER — ENTRESTO 49-51 MG PO TABS: 1.0000 | ORAL_TABLET | Freq: Two times a day (BID) | ORAL | 11 refills | Status: DC

## 2020-04-15 ENCOUNTER — Telehealth: Payer: Self-pay | Admitting: Cardiology

## 2020-04-15 NOTE — Telephone Encounter (Signed)
° °  Pt c/o medication issue:  1. Name of Medication:   sacubitril-valsartan (ENTRESTO) 49-51 MG    2. How are you currently taking this medication (dosage and times per day)? Take 1 tablet by mouth 2 (two) times daily.  3. Are you having a reaction (difficulty breathing--STAT)?   4. What is your medication issue? Pt's wife calling, she said she picked up this medication today and copay went up from $6 to $45. She said that's too much and wanted to make sure it is still generic type of drug and wondering if Dr. Percival Spanish can prescribed a cheaper alternative  Please call

## 2020-04-15 NOTE — Telephone Encounter (Signed)
Spoke with patient's spouse. Spouse is going to call pharmacy and ask for generic for [facility] and not name brand. She is also on the phone with her insurance company to determine price increase reason. Will call back if medication continues to be too expensive.

## 2020-04-23 NOTE — Telephone Encounter (Signed)
Patient's wife is returning call. 

## 2020-04-23 NOTE — Telephone Encounter (Signed)
The patient's wife stated that she would drop the paperwork off tomorrow 5/6 to [facility].

## 2020-04-23 NOTE — Telephone Encounter (Signed)
Attempted to reach the patient. There was no answer no voicemail option.

## 2020-04-23 NOTE — Telephone Encounter (Signed)
Patient's wife calling back stating they have a form to drop off at the office to get his entresto price decreased. She would like to know if they can drop it off.

## 2020-04-24 ENCOUNTER — Telehealth: Payer: Self-pay | Admitting: Cardiology

## 2020-04-24 NOTE — Telephone Encounter (Signed)
Patient's wife calling stating she is returning a call from today. I did not see a note.

## 2020-04-24 NOTE — Telephone Encounter (Signed)
Spoke with patient's spouse. She thought I Lonn Georgia, RN) may be calling about the [employer] assistance program. Informed spouse application will be faxed tomorrow. No other documentation of a call has occurred. Spouse verbalized understanding.

## 2020-05-06 ENCOUNTER — Telehealth: Payer: Self-pay

## 2020-05-06 NOTE — Telephone Encounter (Signed)
Patient has been approved by [employer] Patient [employer] for [place] assistance through the calendar year.

## 2020-05-07 DIAGNOSIS — C61 Malignant neoplasm of prostate: Secondary | ICD-10-CM | POA: Diagnosis not present

## 2020-05-14 DIAGNOSIS — C61 Malignant neoplasm of prostate: Secondary | ICD-10-CM | POA: Diagnosis not present

## 2020-05-30 DIAGNOSIS — H353131 Nonexudative age-related macular degeneration, bilateral, early dry stage: Secondary | ICD-10-CM | POA: Diagnosis not present

## 2020-05-30 DIAGNOSIS — Z961 Presence of intraocular lens: Secondary | ICD-10-CM | POA: Diagnosis not present

## 2020-05-30 DIAGNOSIS — D3132 Benign neoplasm of left choroid: Secondary | ICD-10-CM | POA: Diagnosis not present

## 2020-05-30 DIAGNOSIS — H2511 Age-related nuclear cataract, right eye: Secondary | ICD-10-CM | POA: Diagnosis not present

## 2020-06-10 ENCOUNTER — Telehealth: Payer: Self-pay | Admitting: Family Medicine

## 2020-06-10 NOTE — Telephone Encounter (Signed)
Medication:ezetimibe (ZETIA) 10 MG tablet  nitroGLYCERIN (NITROSTAT) 0.4 MG SL tablet   Has the patient contacted their pharmacy? No. (If no, request that the patient contact the pharmacy for the refill.) (If yes, when and what did the pharmacy advise?)  Preferred Pharmacy (with phone number or street name):  [id number] [address], [address] AT [address]  [place], [place] [address]  Phone:  [phone] Fax:  [phone]   Agent: Please be advised that RX refills may take up to 3 business days. We ask that you follow-up with your pharmacy.

## 2020-06-10 NOTE — Telephone Encounter (Signed)
Pt is overdue for visit w/ Dr. Lorelei Pont.

## 2020-06-11 NOTE — Telephone Encounter (Signed)
Called patient yesterday and today to inform about appt. But his memory on machine was full

## 2020-06-12 ENCOUNTER — Telehealth: Payer: Self-pay | Admitting: Family Medicine

## 2020-06-12 MED ORDER — EZETIMIBE 10 MG PO TABS: 5.0000 mg | ORAL_TABLET | Freq: Every day | ORAL | 0 refills | Status: DC

## 2020-06-12 NOTE — Telephone Encounter (Signed)
CallerMariann Laster Call Back # [phone]  Patient's spouse called , she would like to speak with a nurse of Dr.Copland in regards to referring her husband to a kidney specialist.

## 2020-06-12 NOTE — Telephone Encounter (Signed)
No answer / memory full  Need to be seen. Last seen 04/2019

## 2020-06-13 DIAGNOSIS — R1084 Generalized abdominal pain: Secondary | ICD-10-CM | POA: Diagnosis not present

## 2020-06-13 DIAGNOSIS — R109 Unspecified abdominal pain: Secondary | ICD-10-CM | POA: Diagnosis not present

## 2020-06-13 DIAGNOSIS — C61 Malignant neoplasm of prostate: Secondary | ICD-10-CM | POA: Diagnosis not present

## 2020-08-06 ENCOUNTER — Other Ambulatory Visit: Payer: Self-pay

## 2020-08-06 MED ORDER — EZETIMIBE 10 MG PO TABS: 5.0000 mg | ORAL_TABLET | Freq: Every day | ORAL | 1 refills | Status: DC

## 2020-09-27 ENCOUNTER — Telehealth: Payer: Self-pay | Admitting: Physician Assistant

## 2020-09-27 NOTE — Telephone Encounter (Signed)
I was finally able to reach the patient's wife.  Patient fell this morning around 11 AM.  She does not know if he had a mechanical fall or he fell because of dizziness.  She says the patient did not mention any dizziness to her.  There is no syncope and unfortunately he did not hit his head either.  Initially he was quite weak, however now he is stronger.  Dr. Percival Spanish previously gave them a blood pressure cuff, however she has misplaced it and unable to find a blood pressure cuff to check his blood pressure.  She did not want to go to the emergency room.  And since he is doing better, I recommended continue observation in the car office if anything changes such as altered mental status, chest pain or worsening shortness of breath.  Otherwise she is planning to call our office on Monday morning to schedule earlier appointment.    They do have a son who lives in [place], however it does not sound like they receive a whole lot to help.  May need to consider to arrange transportation for the couple has she has a bad foot and unable to get around very well either.  She does drive rarely, however it is likely not very safe.

## 2020-09-27 NOTE — Telephone Encounter (Signed)
Paged by the patient's wife regarding "tingle in the heart" after the patient fell and became very weak. So far I have called the patient 3 times in the last hour and left 3 messages. Will make 1 more attempt at [facility]

## 2020-09-29 ENCOUNTER — Telehealth: Payer: Self-pay | Admitting: Family Medicine

## 2020-09-29 DIAGNOSIS — R54 Age-related physical debility: Secondary | ICD-10-CM

## 2020-09-29 DIAGNOSIS — F0390 Unspecified dementia without behavioral disturbance: Secondary | ICD-10-CM

## 2020-09-29 DIAGNOSIS — F03A0 Unspecified dementia, mild, without behavioral disturbance, psychotic disturbance, mood disturbance, and anxiety: Secondary | ICD-10-CM

## 2020-09-29 NOTE — Telephone Encounter (Signed)
Caller : Mariann Laster  Call [facility] [address] [phone]  Patient's spouse is requesting a referral to in home health service for patient to help with bathing and daily activities within the home. Patient would like to sent the [facility] to [facility]   Please advise

## 2020-09-30 NOTE — Telephone Encounter (Signed)
Patient wife is calling back to check the status of referral

## 2020-10-02 ENCOUNTER — Telehealth: Payer: Self-pay | Admitting: Family Medicine

## 2020-10-02 DIAGNOSIS — R296 Repeated falls: Secondary | ICD-10-CM

## 2020-10-02 NOTE — Telephone Encounter (Signed)
Spoke to wife Mariann Laster, in addition to home health nursing she would like home health physical therapy for patient, he has had several falls  I also do have a call in to their son, patient and his wife need more help and support from family members.  They are very elderly and living on their own with little help
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
## 2020-10-13 ENCOUNTER — Ambulatory Visit (INDEPENDENT_AMBULATORY_CARE_PROVIDER_SITE_OTHER): Payer: Medicare HMO | Admitting: Family Medicine

## 2020-10-13 ENCOUNTER — Encounter: Payer: Self-pay | Admitting: Family Medicine

## 2020-10-13 ENCOUNTER — Other Ambulatory Visit: Payer: Self-pay

## 2020-10-13 VITALS — BP 128/70 | HR 70 | Resp 17 | Ht 64.0 in | Wt 140.0 lb

## 2020-10-13 DIAGNOSIS — I10 Essential (primary) hypertension: Secondary | ICD-10-CM

## 2020-10-13 DIAGNOSIS — R296 Repeated falls: Secondary | ICD-10-CM

## 2020-10-13 DIAGNOSIS — E782 Mixed hyperlipidemia: Secondary | ICD-10-CM | POA: Diagnosis not present

## 2020-10-13 DIAGNOSIS — E349 Endocrine disorder, unspecified: Secondary | ICD-10-CM

## 2020-10-13 DIAGNOSIS — IMO0002 Reserved for concepts with insufficient information to code with codable children: Secondary | ICD-10-CM

## 2020-10-13 DIAGNOSIS — E348 Other specified endocrine disorders: Secondary | ICD-10-CM | POA: Diagnosis not present

## 2020-10-13 LAB — CBC
MCH: 30.1 pg (ref 27.0–33.0)
MCHC: 32.4 g/dL (ref 32.0–36.0)

## 2020-10-13 NOTE — Patient Instructions (Addendum)
It was good to see you again today We will set up home physical therapy to work on strength and balance I ordered a bone density scan for you- please stop by imaging on the ground floor after you labs and ask about getting this done today  I would recommend a covid 19 booster dose for you

## 2020-10-14 ENCOUNTER — Encounter: Payer: Self-pay | Admitting: Family Medicine

## 2020-10-14 LAB — COMPREHENSIVE METABOLIC PANEL
AG Ratio: 1.8 (calc) (ref 1.0–2.5)
ALT: 13 U/L (ref 9–46)
AST: 14 U/L (ref 10–35)
Albumin: 4 g/dL (ref 3.6–5.1)
Alkaline phosphatase (APISO): 78 U/L (ref 35–144)
BUN/Creatinine Ratio: 25 (calc) — ABNORMAL HIGH (ref 6–22)
BUN: 26 mg/dL — ABNORMAL HIGH (ref 7–25)
CO2: 25 mmol/L (ref 20–32)
Calcium: 9.5 mg/dL (ref 8.6–10.3)
Chloride: 101 mmol/L (ref 98–110)
Creat: 1.05 mg/dL (ref 0.70–1.11)
Globulin: 2.2 g/dL (calc) (ref 1.9–3.7)
Glucose, Bld: 102 mg/dL — ABNORMAL HIGH (ref 65–99)
Potassium: 4.5 mmol/L (ref 3.5–5.3)
Sodium: 134 mmol/L — ABNORMAL LOW (ref 135–146)
Total Bilirubin: 0.8 mg/dL (ref 0.2–1.2)
Total Protein: 6.2 g/dL (ref 6.1–8.1)

## 2020-10-14 LAB — CBC
HCT: 40.8 % (ref 38.5–50.0)
Hemoglobin: 13.2 g/dL (ref 13.2–17.1)
MCV: 92.9 fL (ref 80.0–100.0)
MPV: 10.7 fL (ref 7.5–12.5)
Platelets: 168 10*3/uL (ref 140–400)
RBC: 4.39 10*6/uL (ref 4.20–5.80)
RDW: 13.5 % (ref 11.0–15.0)
WBC: 5.1 10*3/uL (ref 3.8–10.8)

## 2020-10-14 LAB — LIPID PANEL
Cholesterol: 190 mg/dL (ref <200)
HDL: 42 mg/dL (ref >=40)
LDL Cholesterol (Calc): 131 mg/dL (calc) — ABNORMAL HIGH
Non-HDL Cholesterol (Calc): 148 mg/dL (calc) — ABNORMAL HIGH (ref <130)
Total CHOL/HDL Ratio: 4.5 (calc) (ref <5.0)
Triglycerides: 78 mg/dL (ref <150)

## 2020-10-15 ENCOUNTER — Telehealth: Payer: Self-pay | Admitting: Family Medicine

## 2020-10-15 DIAGNOSIS — F32A Depression, unspecified: Secondary | ICD-10-CM | POA: Diagnosis not present

## 2020-10-15 DIAGNOSIS — I69392 Facial weakness following cerebral infarction: Secondary | ICD-10-CM | POA: Diagnosis not present

## 2020-10-15 DIAGNOSIS — F419 Anxiety disorder, unspecified: Secondary | ICD-10-CM | POA: Diagnosis not present

## 2020-10-15 DIAGNOSIS — R569 Unspecified convulsions: Secondary | ICD-10-CM | POA: Diagnosis not present

## 2020-10-15 DIAGNOSIS — I2510 Atherosclerotic heart disease of native coronary artery without angina pectoris: Secondary | ICD-10-CM | POA: Diagnosis not present

## 2020-10-15 DIAGNOSIS — I5022 Chronic systolic (congestive) heart failure: Secondary | ICD-10-CM | POA: Diagnosis not present

## 2020-10-15 DIAGNOSIS — S0180XD Unspecified open wound of other part of head, subsequent encounter: Secondary | ICD-10-CM | POA: Diagnosis not present

## 2020-10-15 DIAGNOSIS — I110 Hypertensive heart disease with heart failure: Secondary | ICD-10-CM | POA: Diagnosis not present

## 2020-10-15 DIAGNOSIS — F0390 Unspecified dementia without behavioral disturbance: Secondary | ICD-10-CM | POA: Diagnosis not present

## 2020-10-15 NOTE — Telephone Encounter (Signed)
Verbal orders given  

## 2020-10-15 NOTE — Telephone Encounter (Signed)
Caller name: Elmo Putt ([place]) Call back number: [phone]  [facility] to leave message  Need verbal for: PT- evaluation OT-evaluation Medical Social worker _ evaluation  Nursing 2 times a week for 2 weeks 1 time a week for 7 weeks  Home Health Aid 2 times a week for 2 weeks

## 2020-10-18 DIAGNOSIS — F32A Depression, unspecified: Secondary | ICD-10-CM | POA: Diagnosis not present

## 2020-10-18 DIAGNOSIS — R569 Unspecified convulsions: Secondary | ICD-10-CM | POA: Diagnosis not present

## 2020-10-18 DIAGNOSIS — F0390 Unspecified dementia without behavioral disturbance: Secondary | ICD-10-CM | POA: Diagnosis not present

## 2020-10-18 DIAGNOSIS — I110 Hypertensive heart disease with heart failure: Secondary | ICD-10-CM | POA: Diagnosis not present

## 2020-10-18 DIAGNOSIS — I69392 Facial weakness following cerebral infarction: Secondary | ICD-10-CM | POA: Diagnosis not present

## 2020-10-18 DIAGNOSIS — S0180XD Unspecified open wound of other part of head, subsequent encounter: Secondary | ICD-10-CM | POA: Diagnosis not present

## 2020-10-18 DIAGNOSIS — I2510 Atherosclerotic heart disease of native coronary artery without angina pectoris: Secondary | ICD-10-CM | POA: Diagnosis not present

## 2020-10-18 DIAGNOSIS — F419 Anxiety disorder, unspecified: Secondary | ICD-10-CM | POA: Diagnosis not present

## 2020-10-18 DIAGNOSIS — I5022 Chronic systolic (congestive) heart failure: Secondary | ICD-10-CM | POA: Diagnosis not present

## 2020-10-20 ENCOUNTER — Inpatient Hospital Stay (HOSPITAL_BASED_OUTPATIENT_CLINIC_OR_DEPARTMENT_OTHER): Admission: RE | Admit: 2020-10-20 | Payer: Medicare HMO | Source: Ambulatory Visit

## 2020-10-20 DIAGNOSIS — I2510 Atherosclerotic heart disease of native coronary artery without angina pectoris: Secondary | ICD-10-CM | POA: Diagnosis not present

## 2020-10-20 DIAGNOSIS — F419 Anxiety disorder, unspecified: Secondary | ICD-10-CM | POA: Diagnosis not present

## 2020-10-20 DIAGNOSIS — F0390 Unspecified dementia without behavioral disturbance: Secondary | ICD-10-CM | POA: Diagnosis not present

## 2020-10-20 DIAGNOSIS — R569 Unspecified convulsions: Secondary | ICD-10-CM | POA: Diagnosis not present

## 2020-10-20 DIAGNOSIS — S0180XD Unspecified open wound of other part of head, subsequent encounter: Secondary | ICD-10-CM | POA: Diagnosis not present

## 2020-10-20 DIAGNOSIS — I69392 Facial weakness following cerebral infarction: Secondary | ICD-10-CM | POA: Diagnosis not present

## 2020-10-20 DIAGNOSIS — I5022 Chronic systolic (congestive) heart failure: Secondary | ICD-10-CM | POA: Diagnosis not present

## 2020-10-20 DIAGNOSIS — I110 Hypertensive heart disease with heart failure: Secondary | ICD-10-CM | POA: Diagnosis not present

## 2020-10-20 DIAGNOSIS — F32A Depression, unspecified: Secondary | ICD-10-CM | POA: Diagnosis not present

## 2020-10-21 DIAGNOSIS — I69392 Facial weakness following cerebral infarction: Secondary | ICD-10-CM | POA: Diagnosis not present

## 2020-10-21 DIAGNOSIS — F419 Anxiety disorder, unspecified: Secondary | ICD-10-CM | POA: Diagnosis not present

## 2020-10-21 DIAGNOSIS — I5022 Chronic systolic (congestive) heart failure: Secondary | ICD-10-CM | POA: Diagnosis not present

## 2020-10-21 DIAGNOSIS — R569 Unspecified convulsions: Secondary | ICD-10-CM | POA: Diagnosis not present

## 2020-10-21 DIAGNOSIS — I2510 Atherosclerotic heart disease of native coronary artery without angina pectoris: Secondary | ICD-10-CM | POA: Diagnosis not present

## 2020-10-21 DIAGNOSIS — S0180XD Unspecified open wound of other part of head, subsequent encounter: Secondary | ICD-10-CM | POA: Diagnosis not present

## 2020-10-21 DIAGNOSIS — F0390 Unspecified dementia without behavioral disturbance: Secondary | ICD-10-CM | POA: Diagnosis not present

## 2020-10-21 DIAGNOSIS — F32A Depression, unspecified: Secondary | ICD-10-CM | POA: Diagnosis not present

## 2020-10-21 DIAGNOSIS — I110 Hypertensive heart disease with heart failure: Secondary | ICD-10-CM | POA: Diagnosis not present

## 2020-10-27 DIAGNOSIS — I2510 Atherosclerotic heart disease of native coronary artery without angina pectoris: Secondary | ICD-10-CM | POA: Diagnosis not present

## 2020-10-27 DIAGNOSIS — S0180XD Unspecified open wound of other part of head, subsequent encounter: Secondary | ICD-10-CM | POA: Diagnosis not present

## 2020-10-27 DIAGNOSIS — F32A Depression, unspecified: Secondary | ICD-10-CM | POA: Diagnosis not present

## 2020-10-27 DIAGNOSIS — F419 Anxiety disorder, unspecified: Secondary | ICD-10-CM | POA: Diagnosis not present

## 2020-10-27 DIAGNOSIS — R569 Unspecified convulsions: Secondary | ICD-10-CM | POA: Diagnosis not present

## 2020-10-27 DIAGNOSIS — I69392 Facial weakness following cerebral infarction: Secondary | ICD-10-CM | POA: Diagnosis not present

## 2020-10-27 DIAGNOSIS — F0390 Unspecified dementia without behavioral disturbance: Secondary | ICD-10-CM | POA: Diagnosis not present

## 2020-10-27 DIAGNOSIS — I5022 Chronic systolic (congestive) heart failure: Secondary | ICD-10-CM | POA: Diagnosis not present

## 2020-10-27 DIAGNOSIS — I110 Hypertensive heart disease with heart failure: Secondary | ICD-10-CM | POA: Diagnosis not present

## 2020-10-28 DIAGNOSIS — I110 Hypertensive heart disease with heart failure: Secondary | ICD-10-CM | POA: Diagnosis not present

## 2020-10-28 DIAGNOSIS — I5022 Chronic systolic (congestive) heart failure: Secondary | ICD-10-CM | POA: Diagnosis not present

## 2020-10-28 DIAGNOSIS — F0390 Unspecified dementia without behavioral disturbance: Secondary | ICD-10-CM | POA: Diagnosis not present

## 2020-10-28 DIAGNOSIS — S0180XD Unspecified open wound of other part of head, subsequent encounter: Secondary | ICD-10-CM | POA: Diagnosis not present

## 2020-10-28 DIAGNOSIS — R569 Unspecified convulsions: Secondary | ICD-10-CM | POA: Diagnosis not present

## 2020-10-28 DIAGNOSIS — F419 Anxiety disorder, unspecified: Secondary | ICD-10-CM | POA: Diagnosis not present

## 2020-10-28 DIAGNOSIS — I69392 Facial weakness following cerebral infarction: Secondary | ICD-10-CM | POA: Diagnosis not present

## 2020-10-28 DIAGNOSIS — F32A Depression, unspecified: Secondary | ICD-10-CM | POA: Diagnosis not present

## 2020-10-28 DIAGNOSIS — I2510 Atherosclerotic heart disease of native coronary artery without angina pectoris: Secondary | ICD-10-CM | POA: Diagnosis not present

## 2020-10-31 DIAGNOSIS — S0180XD Unspecified open wound of other part of head, subsequent encounter: Secondary | ICD-10-CM | POA: Diagnosis not present

## 2020-10-31 DIAGNOSIS — F32A Depression, unspecified: Secondary | ICD-10-CM | POA: Diagnosis not present

## 2020-10-31 DIAGNOSIS — R569 Unspecified convulsions: Secondary | ICD-10-CM | POA: Diagnosis not present

## 2020-10-31 DIAGNOSIS — I110 Hypertensive heart disease with heart failure: Secondary | ICD-10-CM | POA: Diagnosis not present

## 2020-10-31 DIAGNOSIS — I69392 Facial weakness following cerebral infarction: Secondary | ICD-10-CM | POA: Diagnosis not present

## 2020-10-31 DIAGNOSIS — F0390 Unspecified dementia without behavioral disturbance: Secondary | ICD-10-CM | POA: Diagnosis not present

## 2020-10-31 DIAGNOSIS — F419 Anxiety disorder, unspecified: Secondary | ICD-10-CM | POA: Diagnosis not present

## 2020-10-31 DIAGNOSIS — I5022 Chronic systolic (congestive) heart failure: Secondary | ICD-10-CM | POA: Diagnosis not present

## 2020-10-31 DIAGNOSIS — I2510 Atherosclerotic heart disease of native coronary artery without angina pectoris: Secondary | ICD-10-CM | POA: Diagnosis not present

## 2020-11-03 DIAGNOSIS — I69392 Facial weakness following cerebral infarction: Secondary | ICD-10-CM | POA: Diagnosis not present

## 2020-11-03 DIAGNOSIS — I110 Hypertensive heart disease with heart failure: Secondary | ICD-10-CM | POA: Diagnosis not present

## 2020-11-03 DIAGNOSIS — R569 Unspecified convulsions: Secondary | ICD-10-CM | POA: Diagnosis not present

## 2020-11-03 DIAGNOSIS — I2510 Atherosclerotic heart disease of native coronary artery without angina pectoris: Secondary | ICD-10-CM | POA: Diagnosis not present

## 2020-11-03 DIAGNOSIS — F32A Depression, unspecified: Secondary | ICD-10-CM | POA: Diagnosis not present

## 2020-11-03 DIAGNOSIS — F0390 Unspecified dementia without behavioral disturbance: Secondary | ICD-10-CM | POA: Diagnosis not present

## 2020-11-03 DIAGNOSIS — F419 Anxiety disorder, unspecified: Secondary | ICD-10-CM | POA: Diagnosis not present

## 2020-11-03 DIAGNOSIS — I5022 Chronic systolic (congestive) heart failure: Secondary | ICD-10-CM | POA: Diagnosis not present

## 2020-11-03 DIAGNOSIS — S0180XD Unspecified open wound of other part of head, subsequent encounter: Secondary | ICD-10-CM | POA: Diagnosis not present

## 2020-11-05 DIAGNOSIS — F419 Anxiety disorder, unspecified: Secondary | ICD-10-CM | POA: Diagnosis not present

## 2020-11-05 DIAGNOSIS — R569 Unspecified convulsions: Secondary | ICD-10-CM | POA: Diagnosis not present

## 2020-11-05 DIAGNOSIS — I110 Hypertensive heart disease with heart failure: Secondary | ICD-10-CM | POA: Diagnosis not present

## 2020-11-05 DIAGNOSIS — I2510 Atherosclerotic heart disease of native coronary artery without angina pectoris: Secondary | ICD-10-CM | POA: Diagnosis not present

## 2020-11-05 DIAGNOSIS — I69392 Facial weakness following cerebral infarction: Secondary | ICD-10-CM | POA: Diagnosis not present

## 2020-11-05 DIAGNOSIS — F0390 Unspecified dementia without behavioral disturbance: Secondary | ICD-10-CM | POA: Diagnosis not present

## 2020-11-05 DIAGNOSIS — S0180XD Unspecified open wound of other part of head, subsequent encounter: Secondary | ICD-10-CM | POA: Diagnosis not present

## 2020-11-05 DIAGNOSIS — I5022 Chronic systolic (congestive) heart failure: Secondary | ICD-10-CM | POA: Diagnosis not present

## 2020-11-05 DIAGNOSIS — F32A Depression, unspecified: Secondary | ICD-10-CM | POA: Diagnosis not present

## 2020-11-11 ENCOUNTER — Telehealth: Payer: Self-pay | Admitting: Family Medicine

## 2020-11-11 DIAGNOSIS — F0390 Unspecified dementia without behavioral disturbance: Secondary | ICD-10-CM | POA: Diagnosis not present

## 2020-11-11 DIAGNOSIS — R569 Unspecified convulsions: Secondary | ICD-10-CM | POA: Diagnosis not present

## 2020-11-11 DIAGNOSIS — I2510 Atherosclerotic heart disease of native coronary artery without angina pectoris: Secondary | ICD-10-CM | POA: Diagnosis not present

## 2020-11-11 DIAGNOSIS — F32A Depression, unspecified: Secondary | ICD-10-CM | POA: Diagnosis not present

## 2020-11-11 DIAGNOSIS — F419 Anxiety disorder, unspecified: Secondary | ICD-10-CM | POA: Diagnosis not present

## 2020-11-11 DIAGNOSIS — I69392 Facial weakness following cerebral infarction: Secondary | ICD-10-CM | POA: Diagnosis not present

## 2020-11-11 DIAGNOSIS — I110 Hypertensive heart disease with heart failure: Secondary | ICD-10-CM | POA: Diagnosis not present

## 2020-11-11 DIAGNOSIS — I5022 Chronic systolic (congestive) heart failure: Secondary | ICD-10-CM | POA: Diagnosis not present

## 2020-11-11 DIAGNOSIS — S0180XD Unspecified open wound of other part of head, subsequent encounter: Secondary | ICD-10-CM | POA: Diagnosis not present

## 2020-11-11 NOTE — Telephone Encounter (Signed)
[place] Call Back [phone]   Lennette Bihari with [facility] is requesting verbal order to extended PT  : 1X4 starting next week.

## 2020-11-11 NOTE — Telephone Encounter (Signed)
Verbal orders given via detailed message on voicemail.

## 2020-11-12 DIAGNOSIS — S0180XD Unspecified open wound of other part of head, subsequent encounter: Secondary | ICD-10-CM | POA: Diagnosis not present

## 2020-11-12 DIAGNOSIS — F32A Depression, unspecified: Secondary | ICD-10-CM | POA: Diagnosis not present

## 2020-11-12 DIAGNOSIS — I110 Hypertensive heart disease with heart failure: Secondary | ICD-10-CM | POA: Diagnosis not present

## 2020-11-12 DIAGNOSIS — I5022 Chronic systolic (congestive) heart failure: Secondary | ICD-10-CM | POA: Diagnosis not present

## 2020-11-12 DIAGNOSIS — I2510 Atherosclerotic heart disease of native coronary artery without angina pectoris: Secondary | ICD-10-CM | POA: Diagnosis not present

## 2020-11-12 DIAGNOSIS — F419 Anxiety disorder, unspecified: Secondary | ICD-10-CM | POA: Diagnosis not present

## 2020-11-12 DIAGNOSIS — R569 Unspecified convulsions: Secondary | ICD-10-CM | POA: Diagnosis not present

## 2020-11-12 DIAGNOSIS — I69392 Facial weakness following cerebral infarction: Secondary | ICD-10-CM | POA: Diagnosis not present

## 2020-11-12 DIAGNOSIS — F0390 Unspecified dementia without behavioral disturbance: Secondary | ICD-10-CM | POA: Diagnosis not present

## 2020-11-19 DIAGNOSIS — I5022 Chronic systolic (congestive) heart failure: Secondary | ICD-10-CM | POA: Diagnosis not present

## 2020-11-19 DIAGNOSIS — F32A Depression, unspecified: Secondary | ICD-10-CM | POA: Diagnosis not present

## 2020-11-19 DIAGNOSIS — R569 Unspecified convulsions: Secondary | ICD-10-CM | POA: Diagnosis not present

## 2020-11-19 DIAGNOSIS — I69392 Facial weakness following cerebral infarction: Secondary | ICD-10-CM | POA: Diagnosis not present

## 2020-11-19 DIAGNOSIS — F419 Anxiety disorder, unspecified: Secondary | ICD-10-CM | POA: Diagnosis not present

## 2020-11-19 DIAGNOSIS — F0390 Unspecified dementia without behavioral disturbance: Secondary | ICD-10-CM | POA: Diagnosis not present

## 2020-11-19 DIAGNOSIS — I110 Hypertensive heart disease with heart failure: Secondary | ICD-10-CM | POA: Diagnosis not present

## 2020-11-19 DIAGNOSIS — I2510 Atherosclerotic heart disease of native coronary artery without angina pectoris: Secondary | ICD-10-CM | POA: Diagnosis not present

## 2020-11-19 DIAGNOSIS — S0180XD Unspecified open wound of other part of head, subsequent encounter: Secondary | ICD-10-CM | POA: Diagnosis not present

## 2020-11-25 DIAGNOSIS — R6889 Other general symptoms and signs: Secondary | ICD-10-CM | POA: Diagnosis not present

## 2020-11-26 DIAGNOSIS — F0390 Unspecified dementia without behavioral disturbance: Secondary | ICD-10-CM | POA: Diagnosis not present

## 2020-11-26 DIAGNOSIS — S0180XD Unspecified open wound of other part of head, subsequent encounter: Secondary | ICD-10-CM | POA: Diagnosis not present

## 2020-11-26 DIAGNOSIS — I5022 Chronic systolic (congestive) heart failure: Secondary | ICD-10-CM | POA: Diagnosis not present

## 2020-11-26 DIAGNOSIS — R569 Unspecified convulsions: Secondary | ICD-10-CM | POA: Diagnosis not present

## 2020-11-26 DIAGNOSIS — I2510 Atherosclerotic heart disease of native coronary artery without angina pectoris: Secondary | ICD-10-CM | POA: Diagnosis not present

## 2020-11-26 DIAGNOSIS — I69392 Facial weakness following cerebral infarction: Secondary | ICD-10-CM | POA: Diagnosis not present

## 2020-11-26 DIAGNOSIS — F419 Anxiety disorder, unspecified: Secondary | ICD-10-CM | POA: Diagnosis not present

## 2020-11-26 DIAGNOSIS — F32A Depression, unspecified: Secondary | ICD-10-CM | POA: Diagnosis not present

## 2020-11-26 DIAGNOSIS — I110 Hypertensive heart disease with heart failure: Secondary | ICD-10-CM | POA: Diagnosis not present

## 2020-12-06 DIAGNOSIS — F0390 Unspecified dementia without behavioral disturbance: Secondary | ICD-10-CM | POA: Diagnosis not present

## 2020-12-06 DIAGNOSIS — I2510 Atherosclerotic heart disease of native coronary artery without angina pectoris: Secondary | ICD-10-CM | POA: Diagnosis not present

## 2020-12-06 DIAGNOSIS — I5022 Chronic systolic (congestive) heart failure: Secondary | ICD-10-CM | POA: Diagnosis not present

## 2020-12-06 DIAGNOSIS — F32A Depression, unspecified: Secondary | ICD-10-CM | POA: Diagnosis not present

## 2020-12-06 DIAGNOSIS — F419 Anxiety disorder, unspecified: Secondary | ICD-10-CM | POA: Diagnosis not present

## 2020-12-06 DIAGNOSIS — I69392 Facial weakness following cerebral infarction: Secondary | ICD-10-CM | POA: Diagnosis not present

## 2020-12-06 DIAGNOSIS — I110 Hypertensive heart disease with heart failure: Secondary | ICD-10-CM | POA: Diagnosis not present

## 2020-12-06 DIAGNOSIS — R569 Unspecified convulsions: Secondary | ICD-10-CM | POA: Diagnosis not present

## 2020-12-06 DIAGNOSIS — S0180XD Unspecified open wound of other part of head, subsequent encounter: Secondary | ICD-10-CM | POA: Diagnosis not present

## 2020-12-08 DIAGNOSIS — F0390 Unspecified dementia without behavioral disturbance: Secondary | ICD-10-CM | POA: Diagnosis not present

## 2020-12-08 DIAGNOSIS — I5022 Chronic systolic (congestive) heart failure: Secondary | ICD-10-CM | POA: Diagnosis not present

## 2020-12-08 DIAGNOSIS — I110 Hypertensive heart disease with heart failure: Secondary | ICD-10-CM | POA: Diagnosis not present

## 2020-12-08 DIAGNOSIS — I69392 Facial weakness following cerebral infarction: Secondary | ICD-10-CM | POA: Diagnosis not present

## 2020-12-08 DIAGNOSIS — F32A Depression, unspecified: Secondary | ICD-10-CM | POA: Diagnosis not present

## 2020-12-08 DIAGNOSIS — S0180XD Unspecified open wound of other part of head, subsequent encounter: Secondary | ICD-10-CM | POA: Diagnosis not present

## 2020-12-08 DIAGNOSIS — R569 Unspecified convulsions: Secondary | ICD-10-CM | POA: Diagnosis not present

## 2020-12-08 DIAGNOSIS — F419 Anxiety disorder, unspecified: Secondary | ICD-10-CM | POA: Diagnosis not present

## 2020-12-08 DIAGNOSIS — I2510 Atherosclerotic heart disease of native coronary artery without angina pectoris: Secondary | ICD-10-CM | POA: Diagnosis not present

## 2020-12-31 ENCOUNTER — Telehealth: Payer: Self-pay | Admitting: Family Medicine

## 2020-12-31 MED ORDER — EZETIMIBE 10 MG PO TABS: 5.0000 mg | ORAL_TABLET | Freq: Every day | ORAL | 1 refills | Status: DC

## 2020-12-31 NOTE — Telephone Encounter (Signed)
Medication sent to pharmacy  

## 2020-12-31 NOTE — Telephone Encounter (Signed)
Medication:ezetimibe (ZETIA) 10 MG tablet [[id number]]       Has the patient contacted their pharmacy?  (If no, request that the patient contact the pharmacy for the refill.) (If yes, when and what did the pharmacy advise?)     Preferred Pharmacy (with phone number or street name):  [place], [place] Phone:  [phone]  Fax:  [phone]          Agent: Please be advised that RX refills may take up to 3 business days. We ask that you follow-up with your pharmacy.

## 2021-02-25 DIAGNOSIS — R6889 Other general symptoms and signs: Secondary | ICD-10-CM | POA: Diagnosis not present

## 2021-02-27 ENCOUNTER — Other Ambulatory Visit: Payer: Self-pay

## 2021-02-27 MED ORDER — ENTRESTO 49-51 MG PO TABS: 1.0000 | ORAL_TABLET | Freq: Two times a day (BID) | ORAL | 3 refills | Status: DC

## 2021-03-11 NOTE — Progress Notes (Deleted)
Cardiology Office Note   Date:  03/11/2021   ID:  Victor Waters, DOB 06/29/1926, MRN [id number]  PCP:  Darreld Mclean, MD  Cardiologist:   Minus Breeding, MD   No chief complaint on file.     History of Present Illness: Victor Waters is a 87 y.o. male who presents for follow up of  CAD/CABGx3 2003 (LIMA to LAD, SVG to diagonal, SVG to RCA, and SVG to circumflex).  His most recent echocardiogram from 01/20/2019 showed an LVEF of 45 to 50% mild LVH, mild global hypokinesis pseudonormal diastolic filling, and mild pulmonic valve regurgitation.   Since I last saw him ***   ***  he has had no acute problems.  He has had dyspnea on some leg swelling in the past but is not really experiencing this.  He denies any chest pressure, neck or arm discomfort.  He is not noticing any palpitations, presyncope or syncope.  His wife says he had some falls because he turns sometimes too quickly and he is wearing socks.  He is not having any loss of consciousness however.    Past Medical History:  Diagnosis Date  . Anxiety   . Coronary artery disease    a. s/p CABG;  b. LHC (2/11):  LM 30-40, pLAD 95-99 then 80 and 90, mCFX 90, pRCA occluded, S-dRCA ok, S-D1 ok, S-OM patent with 50, L-LAD ok.  Med Rx recommended.  c.  Myoview (5/13):  Low risk with small fixed inf defect c/w scar, no ischemia, EF 48%.  d.  Echo (6/13):  EF 55-60%, inf HK, Gr 1 DD, MAC;  e. Lexiscan Myoview (10/14):  Low risk, EF 38%, inferobasal infarct, no ischemia.  . Hyperlipidemia   . Hypertension   . Hyponatremia 09/2012   Associated with postural hypotension and confusion  . Insomnia   . Myocardial infarction ([place]) 2003   hx of   . Renal artery stenosis (HCC)    bilateral;  s/p R RA stent 2011  . Seizure ([place]) 2015   unknow etiology and year of seizure???  . Syncope and collapse 08/31/2016  . Systolic heart failure (HCC)    CHRONIC  . TIA (transient ischemic attack)     Past Surgical History:  Procedure  Laterality Date  . CARDIAC CATHETERIZATION  2003   revdeling severe three-vessel disease  . CORONARY ARTERY BYPASS GRAFT     LIMA to LAD, SVG to diagonal, SVG to RCA, and SVG to circumflex  . POLYPECTOMY     colon  . RENAL ARTERY STENT  01-28-10     Current Outpatient Medications  Medication Sig Dispense Refill  . aspirin 81 MG tablet Take 81 mg by mouth at bedtime.     Marland Kitchen CALCIUM-MAGNESIUM-ZINC PO Take 1 tablet by mouth daily.    . calcium-vitamin D (OSCAL WITH D) 500-200 MG-UNIT TABS tablet Take by mouth.    . cholecalciferol (VITAMIN D) 1000 UNITS tablet Take 2,000 Units by mouth daily.    Marland Kitchen ezetimibe (ZETIA) 10 MG tablet Take 0.5 tablets (5 mg total) by mouth daily. 90 tablet 1  . Melatonin 3 MG TBDP Take 1 tablet by mouth at bedtime as needed (sleep). Reported on 04/29/2016    . nitroGLYCERIN (NITROSTAT) 0.4 MG SL tablet DISSOLVE 1 TABLET UNDER THE TONGUE AS NEEDED FOR CHEST PAIN. MAY REPEAT IN 5 MINUTES AS DIRECTED. ER IF NO BETTER. 25 tablet 1  . Omega-3 Fatty Acids (FISH OIL) 1200 MG CPDR Take 1 capsule  by mouth daily.    . sacubitril-valsartan (ENTRESTO) 49-51 MG Take 1 tablet by mouth 2 (two) times daily. 60 tablet 3  . vitamin C (ASCORBIC ACID) 500 MG tablet Take 500 mg by mouth daily.     No current facility-administered medications for this visit.    Allergies:   Lisinopril, Zithromax [azithromycin], and Niacin    ROS:  Please see the history of present illness.   Otherwise, review of systems are positive for ***.   All other systems are reviewed and negative.    PHYSICAL EXAM: VS:  There were no vitals taken for this visit. , BMI There is no height or weight on file to calculate BMI. GENERAL:  Well appearing NECK:  No jugular venous distention, waveform within normal limits, carotid upstroke brisk and symmetric, no bruits, no thyromegaly LUNGS:  Clear to auscultation bilaterally CHEST:  Unremarkable HEART:  PMI not displaced or sustained,S1 and S2 within normal  limits, no S3, no S4, no clicks, no rubs, *** murmurs ABD:  Flat, positive bowel sounds normal in frequency in pitch, no bruits, no rebound, no guarding, no midline pulsatile mass, no hepatomegaly, no splenomegaly EXT:  2 plus pulses throughout, no edema, no cyanosis no clubbing    ***GENERAL:  Well appearing NECK:  No jugular venous distention, waveform within normal limits, carotid upstroke brisk and symmetric, no bruits, no thyromegaly LUNGS:  Clear to auscultation bilaterally CHEST:  Well healed sternotomy scar. HEART:  PMI not displaced or sustained,S1 and S2 within normal limits, no S3, no S4, no clicks, no rubs, 3 out of 6 apical systolic murmur radiating slightly at the outflow tract, no diastolic murmurs ABD:  Flat, positive bowel sounds normal in frequency in pitch, no bruits, no rebound, no guarding, no midline pulsatile mass, no hepatomegaly, no splenomegaly EXT:  2 plus pulses throughout, no edema, no cyanosis no clubbing    EKG:  EKG is *** ordered today. The ekg ordered today demonstrates normal sinus rhythm, rate ***, axis within normal limits, first-degree AV block, intervals within normal limits, premature ventricular complexes in a bigeminal pattern.   Recent Labs: 10/13/2020: ALT 13; BUN 26; Creat 1.05; Hemoglobin 13.2; Platelets 168; Potassium 4.5; Sodium 134    Lipid Panel    Component Value Date/Time   CHOL 190 10/13/2020 1410   CHOL 168 02/26/2020 1412   TRIG 78 10/13/2020 1410   HDL 42 10/13/2020 1410   HDL 46 02/26/2020 1412   CHOLHDL 4.5 10/13/2020 1410   VLDL 18 04/05/2017 0543   LDLCALC 131 (H) 10/13/2020 1410      Wt Readings from Last 3 Encounters:  10/13/20 140 lb (63.5 kg)  02/26/20 148 lb 12.8 oz (67.5 kg)  12/04/19 142 lb 6.4 oz (64.6 kg)      Other studies Reviewed: Additional studies/ records that were reviewed today include: ***. Review of the above records demonstrates:  Please see elsewhere in the note.     ASSESSMENT AND  PLAN:  Shortness of breath/dyspnea on exertion:   ***He seems to be not complaining of this today.  He gets around slowly at home with his advanced age and he is in no distress.  No further cardiovascular testing.   Coronary artery disease: *** The patient has no chest discomfort.  No change in therapy.  He does need some routine blood work to include chemistry and CBC.  Though is not fasting I will check a lipid profile.  Essential hypertension:  *** Blood pressure is well controlled  as above.  No change in therapy.   Hyperlipidemia:  ***  We will check a lipid profile today.  For now he will continue the meds as listed.   Murmur:  ***  I suspect this is related to the significant aortic valve calcification he has.  There was no significant gradient noted last year on echo.  No further work-up.  PVCs:   *** He has bigeminy but is not noticing these.  No change in therapy.   Current medicines are reviewed at length with the patient today.  The patient does not have concerns regarding medicines.  The following changes have been made:  ***  Labs/ tests ordered today include: ***  No orders of the defined types were placed in this encounter.    Disposition:   FU with me in ***    Signed, Minus Breeding, MD  03/11/2021 8:48 PM    [place]

## 2021-03-12 ENCOUNTER — Ambulatory Visit: Payer: Medicare HMO | Admitting: Cardiology

## 2021-03-12 DIAGNOSIS — I10 Essential (primary) hypertension: Secondary | ICD-10-CM

## 2021-03-12 DIAGNOSIS — I2510 Atherosclerotic heart disease of native coronary artery without angina pectoris: Secondary | ICD-10-CM

## 2021-03-12 DIAGNOSIS — E785 Hyperlipidemia, unspecified: Secondary | ICD-10-CM

## 2021-03-12 DIAGNOSIS — I493 Ventricular premature depolarization: Secondary | ICD-10-CM

## 2021-04-19 NOTE — Progress Notes (Signed)
Cardiology Office Note   Date:  04/20/2021   ID:  Victor Waters, DOB Sep 29, 1926, MRN [id number]  PCP:  Victor Mclean, MD  Cardiologist:   Victor Breeding, MD   Chief Complaint  Patient presents with  . Follow-up    12 months.      History of Present Illness: Victor Waters is a 87 y.o. male who presents for follow up of  CAD/CABGx3 2003 (LIMA to LAD, SVG to diagonal, SVG to RCA, and SVG to circumflex).  His most recent echocardiogram from 01/20/2019 showed an LVEF of 45 to 50% mild LVH, mild global hypokinesis pseudonormal diastolic filling, and mild pulmonic valve regurgitation.   Since I last saw him he has done relatively well.  He is getting a little more frail as he gets older but he still gets around and walks with a cane.  He has had some falls but they try to be more careful about this.  He has no shortness of breath which was a previous complaint.  He had PVCs but he says he really does not feel these.  Past Medical History:  Diagnosis Date  . Anxiety   . Coronary artery disease    a. s/p CABG;  b. LHC (2/11):  LM 30-40, pLAD 95-99 then 80 and 90, mCFX 90, pRCA occluded, S-dRCA ok, S-D1 ok, S-OM patent with 50, L-LAD ok.  Med Rx recommended.  c.  Myoview (5/13):  Low risk with small fixed inf defect c/w scar, no ischemia, EF 48%.  d.  Echo (6/13):  EF 55-60%, inf HK, Gr 1 DD, MAC;  e. Lexiscan Myoview (10/14):  Low risk, EF 38%, inferobasal infarct, no ischemia.  . Hyperlipidemia   . Hypertension   . Hyponatremia 09/2012   Associated with postural hypotension and confusion  . Insomnia   . Myocardial infarction ([place]) 2003   hx of   . Renal artery stenosis (HCC)    bilateral;  s/p R RA stent 2011  . Seizure ([place]) 2015   unknow etiology and year of seizure???  . Syncope and collapse 08/31/2016  . Systolic heart failure (HCC)    CHRONIC  . TIA (transient ischemic attack)     Past Surgical History:  Procedure Laterality Date  . CARDIAC CATHETERIZATION  2003    revdeling severe three-vessel disease  . CORONARY ARTERY BYPASS GRAFT     LIMA to LAD, SVG to diagonal, SVG to RCA, and SVG to circumflex  . POLYPECTOMY     colon  . RENAL ARTERY STENT  01-28-10     Current Outpatient Medications  Medication Sig Dispense Refill  . aspirin 81 MG tablet Take 81 mg by mouth at bedtime.    Marland Kitchen CALCIUM-MAGNESIUM-ZINC PO Take 1 tablet by mouth daily.    . calcium-vitamin D (OSCAL WITH D) 500-200 MG-UNIT TABS tablet Take by mouth.    . cholecalciferol (VITAMIN D) 1000 UNITS tablet Take 2,000 Units by mouth daily.    Marland Kitchen ezetimibe (ZETIA) 10 MG tablet Take 0.5 tablets (5 mg total) by mouth daily. 90 tablet 1  . Melatonin 3 MG TBDP Take 1 tablet by mouth at bedtime as needed (sleep). Reported on 04/29/2016    . nitroGLYCERIN (NITROSTAT) 0.4 MG SL tablet DISSOLVE 1 TABLET UNDER THE TONGUE AS NEEDED FOR CHEST PAIN. MAY REPEAT IN 5 MINUTES AS DIRECTED. ER IF NO BETTER. 25 tablet 1  . Omega-3 Fatty Acids (FISH OIL) 1200 MG CPDR Take 1 capsule by mouth daily.    Marland Kitchen  sacubitril-valsartan (ENTRESTO) 49-51 MG Take 1 tablet by mouth 2 (two) times daily. 60 tablet 3  . vitamin C (ASCORBIC ACID) 500 MG tablet Take 500 mg by mouth daily.     No current facility-administered medications for this visit.    Allergies:   Lisinopril, Zithromax [azithromycin], and Niacin    ROS:  Please see the history of present illness.   Otherwise, review of systems are positive for none.   All other systems are reviewed and negative.    PHYSICAL EXAM: VS:  BP (!) 102/52 (BP Location: Left Arm, Patient Position: Sitting, Cuff Size: Normal)   Pulse 95   Ht _0  (1.626 m)   BMI 24.03 kg/m  , BMI Body mass index is 24.03 kg/m. GEN:  No distress, frail NECK:  No jugular venous distention at 90 degrees, waveform within normal limits, carotid upstroke brisk and symmetric, no bruits, no thyromegaly LYMPHATICS:  No cervical adenopathy LUNGS:  Clear to auscultation bilaterally BACK:  No CVA  tenderness CHEST:  Unremarkable HEART:  S1 and S2 within normal limits, no S3, no S4, no clicks, no rubs, soft brief apical systolic murmur, nonradiating, no diastolic murmurs ABD:  Positive bowel sounds normal in frequency in pitch, no bruits, no rebound, no guarding, unable to assess midline mass or bruit with the patient seated. EXT:  2 plus pulses throughout, mild edema, no cyanosis no clubbing    EKG:  EKG is  ordered today. The ekg ordered today demonstrates normal sinus rhythm, rate 95, axis within normal limits, first-degree AV block, intervals within normal limits, premature ventricular complexes in a bigeminal pattern.   Recent Labs: 10/13/2020: ALT 13; BUN 26; Creat 1.05; Hemoglobin 13.2; Platelets 168; Potassium 4.5; Sodium 134    Lipid Panel    Component Value Date/Time   CHOL 190 10/13/2020 1410   CHOL 168 02/26/2020 1412   TRIG 78 10/13/2020 1410   HDL 42 10/13/2020 1410   HDL 46 02/26/2020 1412   CHOLHDL 4.5 10/13/2020 1410   VLDL 18 04/05/2017 0543   LDLCALC 131 (H) 10/13/2020 1410      Wt Readings from Last 3 Encounters:  10/13/20 140 lb (63.5 kg)  02/26/20 148 lb 12.8 oz (67.5 kg)  12/04/19 142 lb 6.4 oz (64.6 kg)      Other studies Reviewed: Additional studies/ records that were reviewed today include: Labs. Review of the above records demonstrates:  Please see elsewhere in the note.     ASSESSMENT AND PLAN:  Shortness of breath/dyspnea on exertion:   He has no resting shortness of breath.  He has no new acute cardiovascular complaints.  No change in therapy.   Coronary artery disease:   The patient has no new sypmtoms.  No further cardiovascular testing is indicated.  We will continue with aggressive risk reduction and meds as listed.  Essential hypertension: Blood pressure is well controlled.  No change in therapy.   Hyperlipidemia:   LDL was 131 with an HDL of 42.  He would like to stop his Zetia and I think this is fine.  I think it is fine  at his age to minimize his meds.  Murmur: I suspect this is related to aortic valve sclerosis.  No change in therapy.   PVCs:    He does not feel these.  No change in therapy.   Current medicines are reviewed at length with the patient today.  The patient does not have concerns regarding medicines.  The following changes have been  made: As above  Labs/ tests ordered today include:   Orders Placed This Encounter  Procedures  . EKG 12-Lead     Disposition:   FU with me in 1 year.     Signed, Victor Breeding, MD  04/20/2021 12:46 PM    [facility]

## 2021-04-20 ENCOUNTER — Ambulatory Visit: Payer: Medicare HMO | Admitting: Cardiology

## 2021-04-20 ENCOUNTER — Other Ambulatory Visit: Payer: Self-pay

## 2021-04-20 ENCOUNTER — Encounter: Payer: Self-pay | Admitting: Cardiology

## 2021-04-20 VITALS — BP 102/52 | HR 95 | Ht 64.0 in

## 2021-04-20 DIAGNOSIS — I2510 Atherosclerotic heart disease of native coronary artery without angina pectoris: Secondary | ICD-10-CM | POA: Diagnosis not present

## 2021-04-20 DIAGNOSIS — I493 Ventricular premature depolarization: Secondary | ICD-10-CM

## 2021-04-20 DIAGNOSIS — R011 Cardiac murmur, unspecified: Secondary | ICD-10-CM

## 2021-04-20 DIAGNOSIS — R0602 Shortness of breath: Secondary | ICD-10-CM

## 2021-04-20 DIAGNOSIS — I10 Essential (primary) hypertension: Secondary | ICD-10-CM | POA: Diagnosis not present

## 2021-04-20 DIAGNOSIS — E785 Hyperlipidemia, unspecified: Secondary | ICD-10-CM | POA: Diagnosis not present

## 2021-04-20 NOTE — Patient Instructions (Signed)
Medication Instructions:  Your physician recommends that you continue on your current medications as directed. Please refer to the Current Medication list given to you today.  *If you need a refill on your cardiac medications before your next appointment, please call your pharmacy*   Lab Work: None ordered.    Testing/Procedures: None ordered.    Follow-Up: At [facility], you and your health needs are our priority.  As part of our continuing mission to provide you with exceptional heart care, we have created designated Provider Care Teams.  These Care Teams include your primary Cardiologist (physician) and Advanced Practice Providers (APPs -  Physician Assistants and Nurse Practitioners) who all work together to provide you with the care you need, when you need it.  We recommend signing up for the patient portal called "MyChart".  Sign up information is provided on this After Visit Summary.  MyChart is used to connect with patients for Virtual Visits (Telemedicine).  Patients are able to view lab/test results, encounter notes, upcoming appointments, etc.  Non-urgent messages can be sent to your provider as well.   To learn more about what you can do with MyChart, go to [email or web address].    Your next appointment:   12 month(s)  The format for your next appointment:   In Person  Provider:   James Hochrein, MD     

## 2021-05-20 ENCOUNTER — Ambulatory Visit (INDEPENDENT_AMBULATORY_CARE_PROVIDER_SITE_OTHER): Payer: Medicare HMO | Admitting: Podiatry

## 2021-05-20 ENCOUNTER — Other Ambulatory Visit: Payer: Self-pay

## 2021-05-20 ENCOUNTER — Encounter: Payer: Self-pay | Admitting: Podiatry

## 2021-05-20 DIAGNOSIS — M79675 Pain in left toe(s): Secondary | ICD-10-CM | POA: Diagnosis not present

## 2021-05-20 DIAGNOSIS — M79674 Pain in right toe(s): Secondary | ICD-10-CM

## 2021-05-20 DIAGNOSIS — B351 Tinea unguium: Secondary | ICD-10-CM

## 2021-05-20 NOTE — Progress Notes (Signed)
Complaint:  Visit Type: Patient presents  to my office for  preventative foot care services. Patient states" my nails have grown long and thick and become painful to walk and wear shoes" . The patient presents for preventative foot care services.  This patient presents to the office with his wife.  Patient has not seen seen in over 14 months. Patient has renal insufficiency and thrombocytopenia.  Podiatric Exam: Vascular: dorsalis pedis and posterior tibial pulses are weakly palpable bilateral. Capillary return is immediate. Cold feet.. Skin turgor WNL  Absent digital hair  B/L. Sensorium: Normal Semmes Weinstein monofilament test. Normal tactile sensation bilaterally. Nail Exam: Pt has thick disfigured discolored nails with subungual debris noted bilateral entire nail hallux through fifth toenails Ulcer Exam: There is no evidence of ulcer or pre-ulcerative changes or infection. Orthopedic Exam: Muscle tone and strength are WNL. No limitations in general ROM. No crepitus or effusions noted. Foot type and digits show no abnormalities. Bony prominences are unremarkable. Skin: No Porokeratosis. No infection or ulcers  Diagnosis:  Onychomycosis, , Pain in right toe, pain in left toes  Treatment & Plan Procedures and Treatment: Consent by patient was obtained for treatment procedures.   Debridement of mycotic and hypertrophic toenails, 1 through 5 bilateral and clearing of subungual debris. No ulceration, no infection noted.  Iatrogenic lesion fifth toenail was cauterized. Return Visit-Office Procedure: Patient instructed to return to the office for a follow up visit 3 months for continued evaluation and treatment.    Gardiner Barefoot DPM

## 2021-07-06 DIAGNOSIS — R3912 Poor urinary stream: Secondary | ICD-10-CM | POA: Diagnosis not present

## 2021-07-06 DIAGNOSIS — R351 Nocturia: Secondary | ICD-10-CM | POA: Diagnosis not present

## 2021-07-06 DIAGNOSIS — N401 Enlarged prostate with lower urinary tract symptoms: Secondary | ICD-10-CM | POA: Diagnosis not present

## 2021-07-06 DIAGNOSIS — C61 Malignant neoplasm of prostate: Secondary | ICD-10-CM | POA: Diagnosis not present

## 2021-07-06 DIAGNOSIS — R350 Frequency of micturition: Secondary | ICD-10-CM | POA: Diagnosis not present

## 2021-07-06 DIAGNOSIS — R3914 Feeling of incomplete bladder emptying: Secondary | ICD-10-CM | POA: Diagnosis not present

## 2021-08-13 ENCOUNTER — Other Ambulatory Visit: Payer: Self-pay | Admitting: Cardiology

## 2021-08-26 ENCOUNTER — Ambulatory Visit: Payer: Medicare HMO | Admitting: Podiatry

## 2021-08-27 DIAGNOSIS — C61 Malignant neoplasm of prostate: Secondary | ICD-10-CM | POA: Diagnosis not present

## 2021-08-28 ENCOUNTER — Telehealth: Payer: Self-pay | Admitting: Cardiology

## 2021-08-28 ENCOUNTER — Other Ambulatory Visit: Payer: Self-pay | Admitting: Cardiology

## 2021-08-28 NOTE — Telephone Encounter (Signed)
Pt c/o medication issue:  1. Name of Medication: ENTRESTO 49-51 MG  2. How are you currently taking this medication (dosage and times per day)? 1 tablet by mouth daily   3. Are you having a reaction (difficulty breathing--STAT)? no  4. What is your medication issue? Patient's wife is trying to get financially assistant in pay for medication but she needs to know exactly issues with his heart. Please advise

## 2021-09-09 ENCOUNTER — Other Ambulatory Visit: Payer: Self-pay

## 2021-09-09 ENCOUNTER — Encounter: Payer: Self-pay | Admitting: Podiatry

## 2021-09-09 ENCOUNTER — Ambulatory Visit (INDEPENDENT_AMBULATORY_CARE_PROVIDER_SITE_OTHER): Payer: Medicare HMO | Admitting: Podiatry

## 2021-09-09 ENCOUNTER — Telehealth: Payer: Self-pay | Admitting: Cardiology

## 2021-09-09 DIAGNOSIS — B351 Tinea unguium: Secondary | ICD-10-CM

## 2021-09-09 DIAGNOSIS — D696 Thrombocytopenia, unspecified: Secondary | ICD-10-CM | POA: Diagnosis not present

## 2021-09-09 DIAGNOSIS — M79674 Pain in right toe(s): Secondary | ICD-10-CM | POA: Diagnosis not present

## 2021-09-09 DIAGNOSIS — M79675 Pain in left toe(s): Secondary | ICD-10-CM

## 2021-09-09 MED ORDER — BLOOD PRESSURE CUFF MISC: 1.0000 | Freq: Every day | 0 refills | Status: AC | PRN

## 2021-09-09 NOTE — Telephone Encounter (Signed)
Follow up:   Patient returning a call back.  

## 2021-09-09 NOTE — Telephone Encounter (Signed)
Pt wife is calling for an RX for a blood pressure monitor rx so it can be covered by insurance. Rx sent to [facility] as requested with note to call pt with $$

## 2021-09-09 NOTE — Telephone Encounter (Signed)
Left message to call back  

## 2021-09-09 NOTE — Progress Notes (Signed)
Complaint:  Visit Type: Patient presents  to my office for  preventative foot care services. Patient states" my nails have grown long and thick and become painful to walk and wear shoes" . The patient presents for preventative foot care services.  This patient presents to the office with his wife.  Patient has not seen seen in over 14 months. Patient has renal insufficiency and thrombocytopenia.  Podiatric Exam: Vascular: dorsalis pedis and posterior tibial pulses are weakly palpable bilateral. Capillary return is immediate. Cold feet.. Skin turgor WNL  Absent digital hair  B/L. Sensorium: Normal Semmes Weinstein monofilament test. Normal tactile sensation bilaterally. Nail Exam: Pt has thick disfigured discolored nails with subungual debris noted bilateral entire nail hallux through fifth toenails.  Fifth toenail left foot is unattached. Ulcer Exam: There is no evidence of ulcer or pre-ulcerative changes or infection. Orthopedic Exam: Muscle tone and strength are WNL. No limitations in general ROM. No crepitus or effusions noted. Foot type and digits show no abnormalities. Bony prominences are unremarkable. Skin: No Porokeratosis. No infection or ulcers  Diagnosis:  Onychomycosis, , Pain in right toe, pain in left toes  Treatment & Plan Procedures and Treatment: Consent by patient was obtained for treatment procedures.   Debridement of mycotic and hypertrophic toenails, 1 through 5 bilateral and clearing of subungual debris. No ulceration, no infection noted.   Return Visit-Office Procedure: Patient instructed to return to the office for a follow up visit 3 months for continued evaluation and treatment.    Gardiner Barefoot DPM

## 2021-09-09 NOTE — Telephone Encounter (Signed)
New Message:     Patient wife called and said patient needs a new blood pressure cuff. She said Dr Percival Spanish  got him his last one.

## 2021-09-25 DIAGNOSIS — I10 Essential (primary) hypertension: Secondary | ICD-10-CM | POA: Diagnosis not present

## 2021-09-25 DIAGNOSIS — I5022 Chronic systolic (congestive) heart failure: Secondary | ICD-10-CM | POA: Diagnosis not present

## 2021-09-25 DIAGNOSIS — D649 Anemia, unspecified: Secondary | ICD-10-CM | POA: Diagnosis not present

## 2021-09-25 DIAGNOSIS — D696 Thrombocytopenia, unspecified: Secondary | ICD-10-CM | POA: Diagnosis not present

## 2021-09-25 DIAGNOSIS — R5383 Other fatigue: Secondary | ICD-10-CM | POA: Diagnosis not present

## 2021-09-25 DIAGNOSIS — R7989 Other specified abnormal findings of blood chemistry: Secondary | ICD-10-CM | POA: Diagnosis not present

## 2021-09-25 DIAGNOSIS — Z8546 Personal history of malignant neoplasm of prostate: Secondary | ICD-10-CM | POA: Diagnosis not present

## 2021-09-25 DIAGNOSIS — R0600 Dyspnea, unspecified: Secondary | ICD-10-CM | POA: Diagnosis not present

## 2021-09-25 DIAGNOSIS — I700 Atherosclerosis of aorta: Secondary | ICD-10-CM | POA: Diagnosis not present

## 2021-09-25 DIAGNOSIS — R053 Chronic cough: Secondary | ICD-10-CM | POA: Diagnosis not present

## 2021-09-25 DIAGNOSIS — Z1331 Encounter for screening for depression: Secondary | ICD-10-CM | POA: Diagnosis not present

## 2021-10-12 ENCOUNTER — Telehealth: Payer: Self-pay | Admitting: Cardiology

## 2021-10-12 NOTE — Telephone Encounter (Signed)
Returned call to patient's wife. She reports shortness of breath, "breathing hard".   He had pneumonia recently - still not feeling well   He had reported SOB at visit in May 2022  Last echo was 2020  Denies weight gain (maybe lost some) and "not much" swelling   Advised hard to tell if shortness of breath is from lungs or cardiac cause  Offered appt with PA on 10/27 but wife declined, only wanting to see MD  There is a 20 min DOD appt on 11/2 (but was a slot created off scheduling error in the length of appt time) -- was not sure if this can be used  Will routed to Dr. Percival Spanish to review

## 2021-10-12 NOTE — Telephone Encounter (Signed)
Pt c/o Shortness Of Breath: STAT if SOB developed within the last 24 hours or pt is noticeably SOB on the phone  1. Are you currently SOB (can you hear that pt is SOB on the phone)? Spoke with wife  2. How long have you been experiencing SOB? Two months  3. Are you SOB when sitting or when up moving around? both  4. Are you currently experiencing any other symptoms? No other symptoms

## 2021-10-13 NOTE — Telephone Encounter (Signed)
Left message for patient/wife that I scheduled visit on 11/2 @ 1:30pm with Dr. Percival Spanish

## 2021-10-16 NOTE — Telephone Encounter (Signed)
Spoke with wife and advised of appointment date/time. She verbalized understanding and said this would be OK for them.

## 2021-10-21 ENCOUNTER — Ambulatory Visit: Payer: Medicare HMO | Admitting: Cardiology

## 2021-10-29 DIAGNOSIS — R5381 Other malaise: Secondary | ICD-10-CM | POA: Diagnosis not present

## 2021-10-29 DIAGNOSIS — I5022 Chronic systolic (congestive) heart failure: Secondary | ICD-10-CM | POA: Diagnosis not present

## 2021-10-29 DIAGNOSIS — Z8546 Personal history of malignant neoplasm of prostate: Secondary | ICD-10-CM | POA: Diagnosis not present

## 2021-10-29 DIAGNOSIS — D649 Anemia, unspecified: Secondary | ICD-10-CM | POA: Diagnosis not present

## 2021-10-29 DIAGNOSIS — I10 Essential (primary) hypertension: Secondary | ICD-10-CM | POA: Diagnosis not present

## 2021-10-29 DIAGNOSIS — Z7409 Other reduced mobility: Secondary | ICD-10-CM | POA: Diagnosis not present

## 2021-10-29 DIAGNOSIS — D696 Thrombocytopenia, unspecified: Secondary | ICD-10-CM | POA: Diagnosis not present

## 2021-10-29 DIAGNOSIS — I700 Atherosclerosis of aorta: Secondary | ICD-10-CM | POA: Diagnosis not present

## 2021-10-29 DIAGNOSIS — R0600 Dyspnea, unspecified: Secondary | ICD-10-CM | POA: Diagnosis not present

## 2021-11-03 DIAGNOSIS — R053 Chronic cough: Secondary | ICD-10-CM | POA: Diagnosis not present

## 2021-11-03 DIAGNOSIS — E871 Hypo-osmolality and hyponatremia: Secondary | ICD-10-CM | POA: Diagnosis not present

## 2021-11-03 DIAGNOSIS — I5022 Chronic systolic (congestive) heart failure: Secondary | ICD-10-CM | POA: Diagnosis not present

## 2021-11-03 DIAGNOSIS — R0600 Dyspnea, unspecified: Secondary | ICD-10-CM | POA: Diagnosis not present

## 2021-11-03 DIAGNOSIS — I5043 Acute on chronic combined systolic (congestive) and diastolic (congestive) heart failure: Secondary | ICD-10-CM | POA: Diagnosis not present

## 2021-11-03 DIAGNOSIS — I10 Essential (primary) hypertension: Secondary | ICD-10-CM | POA: Diagnosis not present

## 2021-11-03 DIAGNOSIS — R600 Localized edema: Secondary | ICD-10-CM | POA: Diagnosis not present

## 2021-11-10 DIAGNOSIS — R053 Chronic cough: Secondary | ICD-10-CM | POA: Diagnosis not present

## 2021-11-10 DIAGNOSIS — Z7409 Other reduced mobility: Secondary | ICD-10-CM | POA: Diagnosis not present

## 2021-11-10 DIAGNOSIS — J90 Pleural effusion, not elsewhere classified: Secondary | ICD-10-CM | POA: Diagnosis not present

## 2021-11-10 DIAGNOSIS — E871 Hypo-osmolality and hyponatremia: Secondary | ICD-10-CM | POA: Diagnosis not present

## 2021-11-10 DIAGNOSIS — I5022 Chronic systolic (congestive) heart failure: Secondary | ICD-10-CM | POA: Diagnosis not present

## 2021-11-10 DIAGNOSIS — I5043 Acute on chronic combined systolic (congestive) and diastolic (congestive) heart failure: Secondary | ICD-10-CM | POA: Diagnosis not present

## 2021-11-10 DIAGNOSIS — R0600 Dyspnea, unspecified: Secondary | ICD-10-CM | POA: Diagnosis not present

## 2021-11-10 DIAGNOSIS — I10 Essential (primary) hypertension: Secondary | ICD-10-CM | POA: Diagnosis not present

## 2021-11-10 DIAGNOSIS — R600 Localized edema: Secondary | ICD-10-CM | POA: Diagnosis not present

## 2021-11-21 DIAGNOSIS — J90 Pleural effusion, not elsewhere classified: Secondary | ICD-10-CM | POA: Diagnosis not present

## 2021-11-21 DIAGNOSIS — E871 Hypo-osmolality and hyponatremia: Secondary | ICD-10-CM | POA: Diagnosis not present

## 2021-11-21 DIAGNOSIS — I2510 Atherosclerotic heart disease of native coronary artery without angina pectoris: Secondary | ICD-10-CM | POA: Diagnosis not present

## 2021-11-21 DIAGNOSIS — E785 Hyperlipidemia, unspecified: Secondary | ICD-10-CM | POA: Diagnosis not present

## 2021-11-21 DIAGNOSIS — I499 Cardiac arrhythmia, unspecified: Secondary | ICD-10-CM | POA: Diagnosis not present

## 2021-11-21 DIAGNOSIS — G4700 Insomnia, unspecified: Secondary | ICD-10-CM | POA: Diagnosis not present

## 2021-11-21 DIAGNOSIS — F419 Anxiety disorder, unspecified: Secondary | ICD-10-CM | POA: Diagnosis not present

## 2021-11-21 DIAGNOSIS — I5043 Acute on chronic combined systolic (congestive) and diastolic (congestive) heart failure: Secondary | ICD-10-CM | POA: Diagnosis not present

## 2021-11-21 DIAGNOSIS — I110 Hypertensive heart disease with heart failure: Secondary | ICD-10-CM | POA: Diagnosis not present

## 2021-11-25 ENCOUNTER — Telehealth: Payer: Self-pay | Admitting: Cardiology

## 2021-11-25 DIAGNOSIS — R053 Chronic cough: Secondary | ICD-10-CM | POA: Diagnosis not present

## 2021-11-25 DIAGNOSIS — I5043 Acute on chronic combined systolic (congestive) and diastolic (congestive) heart failure: Secondary | ICD-10-CM | POA: Diagnosis not present

## 2021-11-25 DIAGNOSIS — J189 Pneumonia, unspecified organism: Secondary | ICD-10-CM | POA: Diagnosis not present

## 2021-11-25 DIAGNOSIS — E871 Hypo-osmolality and hyponatremia: Secondary | ICD-10-CM | POA: Diagnosis not present

## 2021-11-25 DIAGNOSIS — R600 Localized edema: Secondary | ICD-10-CM | POA: Diagnosis not present

## 2021-11-25 DIAGNOSIS — I5022 Chronic systolic (congestive) heart failure: Secondary | ICD-10-CM | POA: Diagnosis not present

## 2021-11-25 DIAGNOSIS — R0600 Dyspnea, unspecified: Secondary | ICD-10-CM | POA: Diagnosis not present

## 2021-11-25 DIAGNOSIS — I10 Essential (primary) hypertension: Secondary | ICD-10-CM | POA: Diagnosis not present

## 2021-11-25 DIAGNOSIS — J90 Pleural effusion, not elsewhere classified: Secondary | ICD-10-CM | POA: Diagnosis not present

## 2021-11-25 NOTE — Telephone Encounter (Signed)
Spoke with patient's wife who is concerned about husband's condition. She reports he has been SOB, fluid on his lungs, HF symptoms that have been going on 2 months. She reports he is sleeping propped up on 4 pillows. He has had pneumonia recently also. She reports he saw his PCP today who suggested he call our office for an appointment.   She reports has has taken some antibiotics for pneumonia and improved and also some lasix and improved. PCP put him on lasix 20mg  yesterday. Wife said CXR showed pneumonia and fluid in lungs.   Advised Dr. Percival Spanish is in our [place] office today but will see if we can get him something scheduled    Checked with [facility] APP but no availability and none at [facility] either.   Of note, this Probation officer spoke with wife about 1 month ago and scheduled MD visit early Nov which wife cancelled b/c she said he was doing better at that time  Added on for visit with O'Neal MD (DOD) on 11/27/21

## 2021-11-25 NOTE — Telephone Encounter (Signed)
Pt c/o Shortness Of Breath: STAT if SOB developed within the last 24 hours or pt is noticeably SOB on the phone  1. Are you currently SOB (can you hear that pt is SOB on the phone)? Wife says he is short of breath at this time  2. How long have you been experiencing SOB?  About 2 months  3. Are you SOB when sitting or when up moving around? both  4. Are you currently experiencing any other symptoms? Coughing, wife wants pt to be seen asap by somebody

## 2021-11-27 ENCOUNTER — Ambulatory Visit: Payer: Medicare HMO | Admitting: Cardiovascular Disease

## 2021-11-27 NOTE — Telephone Encounter (Signed)
Patient's wife called states she couldn't drive him into the office.  She had to give him ready, and it was too much for her.  I scheduled him for 1/17, she would like for him to be seen sooner.

## 2021-11-27 NOTE — Progress Notes (Deleted)
Cardiology Office Note:   Date:  11/27/2021  NAME:  Victor Waters    MRN: [id number] DOB:  07-Apr-1926   PCP:  Sueanne Margarita, DO  Cardiologist:  Minus Breeding, MD  Electrophysiologist:  None   Referring MD: Sueanne Margarita, DO   No chief complaint on file. ***  History of Present Illness:   Victor Waters is a 87 y.o. male with a hx of CAD status post CABG, systolic heart failure, hypertension, hyperlipidemia who presents for follow-up. Problem List CAD/CABG -2003  -LIMA-LAD, SVG-Diag, SVG-RCA, SVG-LCX 2. Systolic HF, 95-18% 3. HTN 4. HLD  Past Medical History: Past Medical History:  Diagnosis Date   Anxiety    Coronary artery disease    a. s/p CABG;  b. LHC (2/11):  LM 30-40, pLAD 95-99 then 80 and 90, mCFX 90, pRCA occluded, S-dRCA ok, S-D1 ok, S-OM patent with 50, L-LAD ok.  Med Rx recommended.  c.  Myoview (5/13):  Low risk with small fixed inf defect c/w scar, no ischemia, EF 48%.  d.  Echo (6/13):  EF 55-60%, inf HK, Gr 1 DD, MAC;  e. Lexiscan Myoview (10/14):  Low risk, EF 38%, inferobasal infarct, no ischemia.   Hyperlipidemia    Hypertension    Hyponatremia 09/2012   Associated with postural hypotension and confusion   Insomnia    Myocardial infarction [facility]) 2003   hx of    Renal artery stenosis (HCC)    bilateral;  s/p R RA stent 2011   Seizure ([place]) 2015   unknow etiology and year of seizure???   Syncope and collapse 84/16/6063   Systolic heart failure (HCC)    CHRONIC   TIA (transient ischemic attack)     Past Surgical History: Past Surgical History:  Procedure Laterality Date   CARDIAC CATHETERIZATION  2003   revdeling severe three-vessel disease   CORONARY ARTERY BYPASS GRAFT     LIMA to LAD, SVG to diagonal, SVG to RCA, and SVG to circumflex   POLYPECTOMY     colon   RENAL ARTERY STENT  01-28-10    Current Medications: No outpatient medications have been marked as taking for the 11/27/21 encounter (Appointment) with Geralynn Rile, MD.      Allergies:    Lisinopril, Zithromax [azithromycin], and Niacin   Social History: Social History   Socioeconomic History   Marital status: Married    Spouse name: Not on file   Number of children: Not on file   Years of education: Not on file   Highest education level: Not on file  Occupational History   Occupation: retired  Tobacco Use   Smoking status: Never   Smokeless tobacco: Never  Substance and Sexual Activity   Alcohol use: No   Drug use: No   Sexual activity: Not Currently  Other Topics Concern   Not on file  Social History Narrative   Married   Lives with wife and 86year old ggd   Social Determinants of Health   Financial Resource Strain: Not on file  Food Insecurity: Not on file  Transportation Needs: Not on file  Physical Activity: Not on file  Stress: Not on file  Social Connections: Not on file     Family History: The patient's ***family history includes Colon cancer in an other family member; Diabetes in his mother; Heart attack (age of onset: 32) in his father; Stroke in his mother.  ROS:   All other ROS reviewed and negative. Pertinent positives noted in the HPI.  EKGs/Labs/Other Studies Reviewed:   The following studies were personally reviewed by me today:  EKG:  EKG is *** ordered today.  The ekg ordered today demonstrates ***, and was personally reviewed by me.   TTE 01/20/2019  1. The left ventricle has mildly reduced systolic function of 94-85%. The  cavity size is mildly increased. There is mild concentric left ventricular  wall thickness. Echo evidence of pseudonormal diastolic filling patterns.   2. Mild global hypokinesis.   3. Normal left atrial size.   4. Normal right atrial size.   5. The mitral valve normal in structure. There is mild mitral annular  calcification present. Regurgitation is trivial by color flow Doppler.   6. Normal tricuspid valve.   7. The aortic valve tricuspid. There is severe thickening and severe   calcification of the aortic valve.   8. Pulmonic valve regurgitation is mild by color flow Doppler.   9. The inferior vena cava was normal in size <50% respiratory variablity.  10. No atrial level shunt detected by color flow Doppler.   Recent Labs: No results found for requested labs within last 8760 hours.   Recent Lipid Panel    Component Value Date/Time   CHOL 190 10/13/2020 1410   CHOL 168 02/26/2020 1412   TRIG 78 10/13/2020 1410   HDL 42 10/13/2020 1410   HDL 46 02/26/2020 1412   CHOLHDL 4.5 10/13/2020 1410   VLDL 18 04/05/2017 0543   LDLCALC 131 (H) 10/13/2020 1410    Physical Exam:   VS:  There were no vitals taken for this visit.   Wt Readings from Last 3 Encounters:  10/13/20 140 lb (63.5 kg)  02/26/20 148 lb 12.8 oz (67.5 kg)  12/04/19 142 lb 6.4 oz (64.6 kg)    General: Well nourished, well developed, in no acute distress Head: Atraumatic, normal size  Eyes: PEERLA, EOMI  Neck: Supple, no JVD Endocrine: No thryomegaly Cardiac: Normal S1, S2; RRR; no murmurs, rubs, or gallops Lungs: Clear to auscultation bilaterally, no wheezing, rhonchi or rales  Abd: Soft, nontender, no hepatomegaly  Ext: No edema, pulses 2+ Musculoskeletal: No deformities, BUE and BLE strength normal and equal Skin: Warm and dry, no rashes   Neuro: Alert and oriented to person, place, time, and situation, CNII-XII grossly intact, no focal deficits  Psych: Normal mood and affect   ASSESSMENT:   Victor Waters is a 87 y.o. male who presents for the following: No diagnosis found.  PLAN:   There are no diagnoses linked to this encounter.  {Are you ordering a CV Procedure (e.g. stress test, cath, DCCV, TEE, etc)?   Press F2        :[id number]}  Disposition: No follow-ups on file.  Medication Adjustments/Labs and Tests Ordered: Current medicines are reviewed at length with the patient today.  Concerns regarding medicines are outlined above.  No orders of the defined types were placed  in this encounter.  No orders of the defined types were placed in this encounter.   There are no Patient Instructions on file for this visit.   Time Spent with Patient: I have spent a total of *** minutes with patient reviewing hospital notes, telemetry, EKGs, labs and examining the patient as well as establishing an assessment and plan that was discussed with the patient.  > 50% of time was spent in direct patient care.  Signed, Addison Naegeli. Audie Box, MD, [facility]  [address] [place], [place] [address] [phone]  [phone]  11/27/2021 12:03 PM

## 2021-11-27 NOTE — Telephone Encounter (Signed)
Spoke with wife Mariann Laster. She reported that Mr. Combes was recently diagnosed with PNA and was put on Levaquin. Dr. Francesco Sor also ordered isosorbide and furosemide due to "fluid on the lungs." Wife wanted to know if patient could take an extra dose of furosemide. I explained that we do not have the medications on the patient's chart and for her to call Dr. Luanna Salk office for further direction on the medications. Also advised wife that if Mr. Nephew breathing got any worse, for her to take him to the emergency room. The patient has a scheduled appointment with Dr. Percival Spanish in January and was placed on the wait list. Wife voiced understanding of this conversation. Sending this note to Dr. Percival Spanish.

## 2021-11-28 ENCOUNTER — Telehealth: Payer: Self-pay | Admitting: Cardiology

## 2021-11-28 NOTE — Telephone Encounter (Signed)
Ms. Hilleary called that her husband was not doing well.  Very weak and she does not have strength to bring him in for appt.  She requests 2 things   1 she would like Hospice to come to home and help, Dr. Percival Spanish agreed to order this for her on Monday 11/30/21 - so will send to his nurse to arrange.  2 on appt 12/15 she would prefer this to be virtual due to difficulty bringing him to office.

## 2021-11-30 ENCOUNTER — Telehealth: Payer: Self-pay

## 2021-11-30 NOTE — Telephone Encounter (Deleted)
No answer. Wanted let advise wife that she will not need to bring patient to appointment on 12/15  with dr. Percival Spanish since he is getting care needed under hospice.

## 2021-11-30 NOTE — Telephone Encounter (Signed)
Spoke with wife and let her know that she will not need to bring patient to appointment on 12/15 with Dr. Percival Spanish since he is getting care needed under hospice. She agreed.

## 2021-11-30 NOTE — Telephone Encounter (Signed)
Opened in error

## 2021-11-30 NOTE — Telephone Encounter (Signed)
No answer. Wanted let advise wife that she will not need to bring patient to appointment on 12/15  with dr. Percival Spanish since he is getting care needed under hospice

## 2021-11-30 NOTE — Telephone Encounter (Addendum)
Attempted to call daughter Carmel [place]. No answer

## 2021-11-30 NOTE — Telephone Encounter (Signed)
Already returned call and spoke with wife.

## 2021-11-30 NOTE — Telephone Encounter (Signed)
Spoke with wife Victor Waters. She stated hospice is now taking care of Victor Waters. I let her know I scheduled a virtual visit for 12/15. She doesn't have Internet and cannot bring patient in. I told her I would ask Dr. Percival Spanish and ask if he could speak to her over the phone. She asked if she should worry about doctor appointments now that patient in under hospice care. Please advise.

## 2021-12-01 ENCOUNTER — Emergency Department (HOSPITAL_COMMUNITY): Payer: Medicare HMO

## 2021-12-01 ENCOUNTER — Inpatient Hospital Stay (HOSPITAL_COMMUNITY)
Admission: EM | Admit: 2021-12-01 | Discharge: 2021-12-07 | DRG: 291 | Disposition: A | Discharge status: Skilled Nursing Facility | Payer: Medicare HMO | Attending: Internal Medicine | Admitting: Internal Medicine

## 2021-12-01 ENCOUNTER — Other Ambulatory Visit: Payer: Self-pay

## 2021-12-01 ENCOUNTER — Encounter (HOSPITAL_COMMUNITY): Payer: Self-pay

## 2021-12-01 DIAGNOSIS — E876 Hypokalemia: Secondary | ICD-10-CM | POA: Diagnosis present

## 2021-12-01 DIAGNOSIS — I252 Old myocardial infarction: Secondary | ICD-10-CM | POA: Diagnosis not present

## 2021-12-01 DIAGNOSIS — I5023 Acute on chronic systolic (congestive) heart failure: Secondary | ICD-10-CM | POA: Diagnosis not present

## 2021-12-01 DIAGNOSIS — Z79899 Other long term (current) drug therapy: Secondary | ICD-10-CM | POA: Diagnosis not present

## 2021-12-01 DIAGNOSIS — R4182 Altered mental status, unspecified: Secondary | ICD-10-CM

## 2021-12-01 DIAGNOSIS — Z823 Family history of stroke: Secondary | ICD-10-CM

## 2021-12-01 DIAGNOSIS — I110 Hypertensive heart disease with heart failure: Principal | ICD-10-CM | POA: Diagnosis present

## 2021-12-01 DIAGNOSIS — I499 Cardiac arrhythmia, unspecified: Secondary | ICD-10-CM | POA: Diagnosis not present

## 2021-12-01 DIAGNOSIS — I509 Heart failure, unspecified: Secondary | ICD-10-CM

## 2021-12-01 DIAGNOSIS — E785 Hyperlipidemia, unspecified: Secondary | ICD-10-CM | POA: Diagnosis present

## 2021-12-01 DIAGNOSIS — I2510 Atherosclerotic heart disease of native coronary artery without angina pectoris: Secondary | ICD-10-CM | POA: Diagnosis present

## 2021-12-01 DIAGNOSIS — Z888 Allergy status to other drugs, medicaments and biological substances status: Secondary | ICD-10-CM

## 2021-12-01 DIAGNOSIS — G4700 Insomnia, unspecified: Secondary | ICD-10-CM | POA: Diagnosis present

## 2021-12-01 DIAGNOSIS — R531 Weakness: Secondary | ICD-10-CM | POA: Diagnosis not present

## 2021-12-01 DIAGNOSIS — J90 Pleural effusion, not elsewhere classified: Secondary | ICD-10-CM | POA: Diagnosis not present

## 2021-12-01 DIAGNOSIS — R0600 Dyspnea, unspecified: Secondary | ICD-10-CM

## 2021-12-01 DIAGNOSIS — Z7982 Long term (current) use of aspirin: Secondary | ICD-10-CM

## 2021-12-01 DIAGNOSIS — Z8673 Personal history of transient ischemic attack (TIA), and cerebral infarction without residual deficits: Secondary | ICD-10-CM

## 2021-12-01 DIAGNOSIS — I5022 Chronic systolic (congestive) heart failure: Secondary | ICD-10-CM | POA: Diagnosis not present

## 2021-12-01 DIAGNOSIS — R5381 Other malaise: Secondary | ICD-10-CM | POA: Diagnosis not present

## 2021-12-01 DIAGNOSIS — Z20822 Contact with and (suspected) exposure to covid-19: Secondary | ICD-10-CM | POA: Diagnosis present

## 2021-12-01 DIAGNOSIS — Z951 Presence of aortocoronary bypass graft: Secondary | ICD-10-CM | POA: Diagnosis not present

## 2021-12-01 DIAGNOSIS — I5021 Acute systolic (congestive) heart failure: Secondary | ICD-10-CM | POA: Diagnosis not present

## 2021-12-01 DIAGNOSIS — Z7401 Bed confinement status: Secondary | ICD-10-CM | POA: Diagnosis not present

## 2021-12-01 DIAGNOSIS — E871 Hypo-osmolality and hyponatremia: Secondary | ICD-10-CM

## 2021-12-01 DIAGNOSIS — R1084 Generalized abdominal pain: Secondary | ICD-10-CM | POA: Diagnosis not present

## 2021-12-01 DIAGNOSIS — Z8249 Family history of ischemic heart disease and other diseases of the circulatory system: Secondary | ICD-10-CM | POA: Diagnosis not present

## 2021-12-01 DIAGNOSIS — R079 Chest pain, unspecified: Secondary | ICD-10-CM | POA: Diagnosis not present

## 2021-12-01 DIAGNOSIS — J9811 Atelectasis: Secondary | ICD-10-CM | POA: Diagnosis present

## 2021-12-01 DIAGNOSIS — R404 Transient alteration of awareness: Secondary | ICD-10-CM | POA: Diagnosis not present

## 2021-12-01 DIAGNOSIS — Z7189 Other specified counseling: Secondary | ICD-10-CM | POA: Diagnosis not present

## 2021-12-01 DIAGNOSIS — Z515 Encounter for palliative care: Secondary | ICD-10-CM | POA: Diagnosis not present

## 2021-12-01 DIAGNOSIS — Z833 Family history of diabetes mellitus: Secondary | ICD-10-CM

## 2021-12-01 DIAGNOSIS — Z66 Do not resuscitate: Secondary | ICD-10-CM | POA: Diagnosis not present

## 2021-12-01 DIAGNOSIS — D649 Anemia, unspecified: Secondary | ICD-10-CM | POA: Diagnosis not present

## 2021-12-01 DIAGNOSIS — G4489 Other headache syndrome: Secondary | ICD-10-CM | POA: Diagnosis not present

## 2021-12-01 DIAGNOSIS — I10 Essential (primary) hypertension: Secondary | ICD-10-CM | POA: Diagnosis not present

## 2021-12-01 DIAGNOSIS — R410 Disorientation, unspecified: Secondary | ICD-10-CM | POA: Diagnosis not present

## 2021-12-01 DIAGNOSIS — R0789 Other chest pain: Secondary | ICD-10-CM | POA: Diagnosis not present

## 2021-12-01 LAB — CBC
HCT: 36.6 % — ABNORMAL LOW (ref 39.0–52.0)
Hemoglobin: 12.8 g/dL — ABNORMAL LOW (ref 13.0–17.0)
MCH: 30.1 pg (ref 26.0–34.0)
MCHC: 35 g/dL (ref 30.0–36.0)
MCV: 86.1 fL (ref 80.0–100.0)
Platelets: 150 10*3/uL (ref 150–400)
RBC: 4.25 MIL/uL (ref 4.22–5.81)
RDW: 14.3 % (ref 11.5–15.5)
WBC: 4.7 10*3/uL (ref 4.0–10.5)
nRBC: 0 % (ref 0.0–0.2)

## 2021-12-01 LAB — CBC WITH DIFFERENTIAL/PLATELET
Abs Immature Granulocytes: 0.02 10*3/uL (ref 0.00–0.07)
Basophils Absolute: 0 10*3/uL (ref 0.0–0.1)
Basophils Relative: 0 %
Eosinophils Absolute: 0.1 10*3/uL (ref 0.0–0.5)
Eosinophils Relative: 2 %
HCT: 35.5 % — ABNORMAL LOW (ref 39.0–52.0)
Hemoglobin: 12.4 g/dL — ABNORMAL LOW (ref 13.0–17.0)
Immature Granulocytes: 1 %
Lymphocytes Relative: 9 %
Lymphs Abs: 0.3 10*3/uL — ABNORMAL LOW (ref 0.7–4.0)
MCH: 30.5 pg (ref 26.0–34.0)
MCHC: 34.9 g/dL (ref 30.0–36.0)
MCV: 87.4 fL (ref 80.0–100.0)
Monocytes Absolute: 0.7 10*3/uL (ref 0.1–1.0)
Monocytes Relative: 18 %
Neutro Abs: 2.8 10*3/uL (ref 1.7–7.7)
Neutrophils Relative %: 70 %
Platelets: 145 10*3/uL — ABNORMAL LOW (ref 150–400)
RBC: 4.06 MIL/uL — ABNORMAL LOW (ref 4.22–5.81)
RDW: 14.5 % (ref 11.5–15.5)
WBC: 3.9 10*3/uL — ABNORMAL LOW (ref 4.0–10.5)
nRBC: 0 % (ref 0.0–0.2)

## 2021-12-01 LAB — URINALYSIS, ROUTINE W REFLEX MICROSCOPIC
Bilirubin Urine: NEGATIVE
Glucose, UA: NEGATIVE mg/dL
Hgb urine dipstick: NEGATIVE
Ketones, ur: 5 mg/dL — AB
Leukocytes,Ua: NEGATIVE
Nitrite: NEGATIVE
Protein, ur: NEGATIVE mg/dL
Specific Gravity, Urine: 1.027 (ref 1.005–1.030)
pH: 6 (ref 5.0–8.0)

## 2021-12-01 LAB — CREATININE, SERUM
Creatinine, Ser: 0.94 mg/dL (ref 0.61–1.24)
GFR, Estimated: >60 mL/min (ref >60)

## 2021-12-01 LAB — COMPREHENSIVE METABOLIC PANEL
ALT: 18 U/L (ref 0–44)
AST: 25 U/L (ref 15–41)
Albumin: 3.5 g/dL (ref 3.5–5.0)
Alkaline Phosphatase: 47 U/L (ref 38–126)
Anion gap: 10 (ref 5–15)
BUN: 18 mg/dL (ref 8–23)
CO2: 25 mmol/L (ref 22–32)
Calcium: 8.7 mg/dL — ABNORMAL LOW (ref 8.9–10.3)
Chloride: 88 mmol/L — ABNORMAL LOW (ref 98–111)
Creatinine, Ser: 0.99 mg/dL (ref 0.61–1.24)
GFR, Estimated: >60 mL/min (ref >60)
Glucose, Bld: 117 mg/dL — ABNORMAL HIGH (ref 70–99)
Potassium: 3.6 mmol/L (ref 3.5–5.1)
Sodium: 123 mmol/L — ABNORMAL LOW (ref 135–145)
Total Bilirubin: 0.7 mg/dL (ref 0.3–1.2)
Total Protein: 5.8 g/dL — ABNORMAL LOW (ref 6.5–8.1)

## 2021-12-01 LAB — NA AND K (SODIUM & POTASSIUM), RAND UR
Potassium Urine: 27 mmol/L
Sodium, Ur: 92 mmol/L

## 2021-12-01 LAB — PROCALCITONIN: Procalcitonin: <0.1 ng/mL

## 2021-12-01 LAB — AMMONIA: Ammonia: <10 umol/L (ref 9–35)

## 2021-12-01 LAB — TROPONIN I (HIGH SENSITIVITY)
Troponin I (High Sensitivity): 26 ng/L — ABNORMAL HIGH (ref <18)
Troponin I (High Sensitivity): 24 ng/L — ABNORMAL HIGH (ref <18)

## 2021-12-01 LAB — RESP PANEL BY RT-PCR (FLU A&B, COVID) ARPGX2
Influenza A by PCR: NEGATIVE
Influenza B by PCR: NEGATIVE
SARS Coronavirus 2 by RT PCR: NEGATIVE

## 2021-12-01 LAB — LACTIC ACID, PLASMA: Lactic Acid, Venous: 1 mmol/L (ref 0.5–1.9)

## 2021-12-01 LAB — BRAIN NATRIURETIC PEPTIDE: B Natriuretic Peptide: 1433.5 pg/mL — ABNORMAL HIGH (ref 0.0–100.0)

## 2021-12-01 MED ORDER — IOHEXOL 350 MG/ML SOLN
80.0000 mL | Freq: Once | INTRAVENOUS | Status: AC | PRN
  Administered 2021-12-01: 80 mL via INTRAVENOUS

## 2021-12-01 MED ORDER — ALBUTEROL SULFATE (2.5 MG/3ML) 0.083% IN NEBU
2.5000 mg | INHALATION_SOLUTION | Freq: Four times a day (QID) | RESPIRATORY_TRACT | Status: DC | PRN
  Administered 2021-12-01 – 2021-12-04 (×5): 2.5 mg via RESPIRATORY_TRACT
  Filled 2021-12-01 (×5): qty 3

## 2021-12-01 MED ORDER — ENOXAPARIN SODIUM 30 MG/0.3ML IJ SOSY
30.0000 mg | PREFILLED_SYRINGE | INTRAMUSCULAR | Status: DC
  Administered 2021-12-01: 30 mg via SUBCUTANEOUS
  Filled 2021-12-01: qty 0.3

## 2021-12-01 MED ORDER — FUROSEMIDE 10 MG/ML IJ SOLN
40.0000 mg | Freq: Two times a day (BID) | INTRAMUSCULAR | Status: DC
  Administered 2021-12-01 – 2021-12-04 (×6): 40 mg via INTRAVENOUS
  Filled 2021-12-01 (×7): qty 4

## 2021-12-01 MED ORDER — FUROSEMIDE 10 MG/ML IJ SOLN
40.0000 mg | Freq: Once | INTRAMUSCULAR | Status: AC
  Administered 2021-12-01: 40 mg via INTRAVENOUS
  Filled 2021-12-01: qty 4

## 2021-12-01 MED ORDER — ACETAMINOPHEN 650 MG RE SUPP: 650.0000 mg | Freq: Four times a day (QID) | RECTAL | Status: DC | PRN

## 2021-12-01 MED ORDER — ACETAMINOPHEN 325 MG PO TABS
650.0000 mg | ORAL_TABLET | Freq: Four times a day (QID) | ORAL | Status: DC | PRN
  Administered 2021-12-03 – 2021-12-07 (×5): 650 mg via ORAL
  Filled 2021-12-01 (×5): qty 2

## 2021-12-01 NOTE — ED Provider Notes (Signed)
[facility] DEPT Provider Note   CSN: [id number] Arrival date & time: 12/01/21  0544     History No chief complaint on file.   Victor Waters is a 87 y.o. male.  Patient presents to the emergency department for evaluation of mental status changes.  Wife called EMS when she noticed he was confused early this morning.  Wife reports that he is very weak.  Patient denies any pain or complaints at this time.      Past Medical History:  Diagnosis Date   Anxiety    Coronary artery disease    a. s/p CABG;  b. LHC (2/11):  LM 30-40, pLAD 95-99 then 80 and 90, mCFX 90, pRCA occluded, S-dRCA ok, S-D1 ok, S-OM patent with 50, L-LAD ok.  Med Rx recommended.  c.  Myoview (5/13):  Low risk with small fixed inf defect c/w scar, no ischemia, EF 48%.  d.  Echo (6/13):  EF 55-60%, inf HK, Gr 1 DD, MAC;  e. Lexiscan Myoview (10/14):  Low risk, EF 38%, inferobasal infarct, no ischemia.   Hyperlipidemia    Hypertension    Hyponatremia 09/2012   Associated with postural hypotension and confusion   Insomnia    Myocardial infarction [facility]) 2003   hx of    Renal artery stenosis (HCC)    bilateral;  s/p R RA stent 2011   Seizure ([place]) 2015   unknow etiology and year of seizure???   Syncope and collapse 40/98/1191   Systolic heart failure ([place])    CHRONIC   TIA (transient ischemic attack)     Patient Active Problem List   Diagnosis Date Noted   Murmur 02/26/2020   Educated about COVID-19 virus infection 02/24/2020   Pain due to onychomycosis of toenails of both feet 01/21/2020   Coronary artery disease involving native coronary artery of native heart without angina pectoris 07/10/2019   Near syncope 01/20/2019   Prostate cancer ([place]) 01/20/2019   Hyperglycemia 01/20/2019   TIA (transient ischemic attack) 04/04/2017   Facial droop 04/04/2017   Localized swelling of lower extremity 04/04/2017   BPH (benign prostatic hyperplasia) 04/04/2017   Stroke-like symptoms  04/04/2017   Syncope and collapse 47/82/9562   Chronic systolic heart failure ([place])    Cerebral infarction, remote, resolved    Mild dementia 08/10/2016   Sleep disturbance 01/06/2016   Ventricular ectopy 12/31/2014   Acute confusional state 12/31/2014   Palpitations    Nocturia 09/20/2013   Hyponatremia 09/29/2012   Fatigue 05/09/2012   PERSONAL HISTORY OTHER DISORDER URINARY SYSTEM 12/22/2010   Thrombocytopenia ([place]) 08/04/2010   LEUKOCYTOPENIA UNSPECIFIED 08/04/2010   TINNITUS, CHRONIC 07/06/2010   RENAL ARTERY STENOSIS 11/25/2009   RENAL INSUFFICIENCY 11/25/2009   DYSPNEA 09/24/2009   BRADYCARDIA 09/04/2009   HYPOTENSION-ORTHOSTATIC 09/04/2009   DIZZINESS 09/04/2009   HYPERLIPIDEMIA 01/31/2008   DEPRESSIVE DISORDER 01/23/2008   Essential hypertension 01/23/2008   ANXIETY 06/02/2007   Coronary atherosclerosis 06/02/2007   Insomnia 06/02/2007    Past Surgical History:  Procedure Laterality Date   CARDIAC CATHETERIZATION  2003   revdeling severe three-vessel disease   CORONARY ARTERY BYPASS GRAFT     LIMA to LAD, SVG to diagonal, SVG to RCA, and SVG to circumflex   POLYPECTOMY     colon   RENAL ARTERY STENT  01-28-10       Family History  Problem Relation Age of Onset   Diabetes Mother    Stroke Mother    Heart attack Father 47  died   Colon cancer Other        paternal grandfather    Social History   Tobacco Use   Smoking status: Never   Smokeless tobacco: Never  Substance Use Topics   Alcohol use: No   Drug use: No    Home Medications Prior to Admission medications   Medication Sig Start Date End Date Taking? Authorizing Provider  aspirin 81 MG tablet Take 81 mg by mouth at bedtime.    [provider]  Blood Pressure Monitoring (BLOOD PRESSURE CUFF) MISC 1 each by Does not apply route daily as needed. 09/09/21   Minus Breeding, MD  CALCIUM-MAGNESIUM-ZINC PO Take 1 tablet by mouth daily.    [provider]  calcium-vitamin D  (OSCAL WITH D) 500-200 MG-UNIT TABS tablet Take by mouth.    [provider]  cholecalciferol (VITAMIN D) 1000 UNITS tablet Take 2,000 Units by mouth daily.    [provider]  ENTRESTO 49-51 MG TAKE 1 TABLET BY MOUTH TWICE DAILY 08/28/21   Minus Breeding, MD  ezetimibe (ZETIA) 10 MG tablet Take 0.5 tablets (5 mg total) by mouth daily. 12/31/20   Copland, Gay Filler, MD  Melatonin 3 MG TBDP Take 1 tablet by mouth at bedtime as needed (sleep). Reported on 04/29/2016    [provider]  nitroGLYCERIN (NITROSTAT) 0.4 MG SL tablet DISSOLVE 1 TABLET UNDER THE TONGUE AS NEEDED FOR CHEST PAIN. MAY REPEAT IN 5 MINUTES AS DIRECTED. ER IF NO BETTER. 08/15/19   Minus Breeding, MD  Omega-3 Fatty Acids (FISH OIL) 1200 MG CPDR Take 1 capsule by mouth daily.    [provider]  vitamin C (ASCORBIC ACID) 500 MG tablet Take 500 mg by mouth daily.    [provider]    Allergies    Lisinopril, Zithromax [azithromycin], and Niacin  Review of Systems   Review of Systems  Constitutional:  Positive for fatigue.  Psychiatric/Behavioral:  Positive for confusion.   All other systems reviewed and are negative.  Physical Exam Updated Vital Signs BP (!) 142/78   Pulse 91   Temp 97.6 F (36.4 C) (Oral)   Resp 15   SpO2 99%   Physical Exam Vitals and nursing note reviewed.  Constitutional:      General: He is not in acute distress.    Appearance: Normal appearance. He is well-developed.  HENT:     Head: Normocephalic and atraumatic.     Right Ear: Hearing normal.     Left Ear: Hearing normal.     Nose: Nose normal.  Eyes:     Conjunctiva/sclera: Conjunctivae normal.     Pupils: Pupils are equal, round, and reactive to light.  Cardiovascular:     Rate and Rhythm: Regular rhythm.     Heart sounds: S1 normal and S2 normal. No murmur heard.   No friction rub. No gallop.  Pulmonary:     Effort: Pulmonary effort is normal. No respiratory distress.     Breath  sounds: Normal breath sounds.  Chest:     Chest wall: No tenderness.  Abdominal:     General: Bowel sounds are normal.     Palpations: Abdomen is soft.     Tenderness: There is no abdominal tenderness. There is no guarding or rebound. Negative signs include Murphy's sign and McBurney's sign.     Hernia: No hernia is present.  Musculoskeletal:        General: Normal range of motion.     Cervical back: Normal  range of motion and neck supple.     Right lower leg: 2+ Pitting Edema present.     Left lower leg: 2+ Pitting Edema present.  Skin:    General: Skin is warm and dry.     Findings: Abrasion (right frontal scalp) present. No rash.  Neurological:     Mental Status: He is alert and oriented to person, place, and time.     GCS: GCS eye subscore is 4. GCS verbal subscore is 5. GCS motor subscore is 6.     Cranial Nerves: No cranial nerve deficit.     Sensory: No sensory deficit.     Coordination: Coordination normal.  Psychiatric:        Speech: Speech normal.        Behavior: Behavior normal.        Thought Content: Thought content normal.    ED Results / Procedures / Treatments   Labs (all labs ordered are listed, but only abnormal results are displayed) Labs Reviewed  CBC WITH DIFFERENTIAL/PLATELET - Abnormal; Notable for the following components:      Result Value   RBC 4.06 (*)    Hemoglobin 12.4 (*)    HCT 35.5 (*)    Platelets 145 (*)    All other components within normal limits  COMPREHENSIVE METABOLIC PANEL  LACTIC ACID, PLASMA  URINALYSIS, ROUTINE W REFLEX MICROSCOPIC  AMMONIA  BRAIN NATRIURETIC PEPTIDE  TROPONIN I (HIGH SENSITIVITY)    EKG None  Radiology DG Chest Port 1 View  Result Date: 12/01/2021 CLINICAL DATA:  edema EXAM: PORTABLE CHEST 1 VIEW COMPARISON:  January 20, 2019. FINDINGS: Mild enlargement of the cardiopericardial silhouette. CABG and median sternotomy. The calcific atherosclerosis of the aorta. Small bilateral pleural effusions. No  visible pneumothorax. Right-sided skin fold. New masslike right hilar opacity. Mild interstitial opacities, including at the lateral lung bases. Streaky retrocardiac opacities. IMPRESSION: 1. New masslike right hilar opacity. Recommend CT chest (preferably with contrast) to exclude a mass/adenopathy. The 2. Mild cardiomegaly with small bilateral pleural effusions and possible mild interstitial edema. 3. Streaky retrocardiac opacities, favor atelectasis. Infection is not excluded. Electronically Signed   By: Margaretha Sheffield M.D.   On: 12/01/2021 07:22    Procedures Procedures   Medications Ordered in ED Medications - No data to display  ED Course  I have reviewed the triage vital signs and the nursing notes.  Pertinent labs & imaging results that were available during my care of the patient were reviewed by me and considered in my medical decision making (see chart for details).  Clinical Course as of 12/01/21 0745  Tue Dec 01, 2021  0703 AMS, pending labs and imaging [MK]    Clinical Course User Index [MK] Kommor, Debe Coder, MD   MDM Rules/Calculators/A&P                           Patient brought to the emergency department for evaluation of altered mental status.  Wife called EMS because he seemed weaker and more confused than usual.  He is in no distress at arrival.  Work-up for altered mental status initiated, signed out to oncoming ER physician to follow-up on results.  Final Clinical Impression(s) / ED Diagnoses Final diagnoses:  Altered mental status, unspecified altered mental status type    Rx / DC Orders ED Discharge Orders     None        Syncere Eble, Gwenyth Allegra, MD 12/01/21 [phone]

## 2021-12-01 NOTE — ED Provider Notes (Signed)
  Physical Exam  BP (!) 142/78   Pulse 91   Temp 97.6 F (36.4 C) (Oral)   Resp 15   SpO2 99%   Physical Exam Vitals and nursing note reviewed.  Constitutional:      General: He is not in acute distress.    Appearance: He is well-developed.  HENT:     Head: Normocephalic and atraumatic.  Eyes:     Conjunctiva/sclera: Conjunctivae normal.  Cardiovascular:     Rate and Rhythm: Normal rate and regular rhythm.     Heart sounds: No murmur heard. Pulmonary:     Effort: Pulmonary effort is normal. No respiratory distress.     Breath sounds: Normal breath sounds.  Abdominal:     Palpations: Abdomen is soft.     Tenderness: There is no abdominal tenderness.  Musculoskeletal:        General: No swelling.     Cervical back: Neck supple.  Skin:    General: Skin is warm and dry.     Capillary Refill: Capillary refill takes less than 2 seconds.  Neurological:     Mental Status: He is alert.  Psychiatric:        Mood and Affect: Mood normal.    ED Course/Procedures   Clinical Course as of 12/01/21 0915  Tue Dec 01, 2021  0703 AMS, pending labs and imaging [MK]    Clinical Course User Index [MK] Ahri Olson, Debe Coder, MD    Procedures  MDM  Patient received in handoff.  Patient presenting with worsening weakness, dyspnea on exertion.  Laboratory evaluation pending at time of signout.  Laboratory evaluation ultimately showing significant hyponatremia 133, hypochloremia to 88, low normal potassium 3.6.  Mild pancytopenia with leukopenia to 3.9, hemoglobin 12.4, thrombocytopenia to 145.  Mild troponin elevation to 26 and delta is pending.  Lactate normal.  Ammonia normal.  BNP significantly elevated to 1433.5.  Initial chest x-ray with a masslike hilar opacity that was evaluated with CT PE in the setting of his shortness of breath on exertion.  The lesion in question is likely a prominent pulmonary artery with no evidence of a mass, but he does have significant bilateral pleural effusions.   Patient generalized weakness and shortness of breath likely secondary to a combination of CHF and hyponatremia either secondary to his diuretic use or SIADH.  Patient is significantly off of his baseline and will require admission for correction of electrolytes and gentle diuresis.       Teressa Lower, MD 12/01/21 [phone]

## 2021-12-01 NOTE — ED Triage Notes (Signed)
Pt is coming from home, wife called EMS because he was more confused than normal, pt is on lasix b/c of CHF, pt has a skin tear on his face for a few months that won't heal Pts wife states that he's more weak then usual also

## 2021-12-01 NOTE — H&P (Signed)
History and Physical    Victor Waters QIO:[id number] DOB: 09-07-1926 DOA: 12/01/2021  PCP: Sueanne Margarita, DO  Patient coming from: Home  Chief Complaint: Difficulty breathing  HPI: Victor Waters is a 87 y.o. male with medical history significant of CAD s/p CABG, HLD, HTN, chronic systolic HF. Presenting with  shortness of breath. History from wife. He has had dyspnea for the last 2 months. He was seen his PCP and was told that he had "fluid on the lungs" and pneumonia. He was given HF meds for several days and antibiotics. He completed that regimen and had some improved. However, about a week ago, he had a return of his symptoms. He was again told he had "fluid on the lungs" and he was given 10 days of diuretic. He had taking that regimen, but not improved. When he remained short of breath this morning, she decided to bring him to the ED today.    ED Course: BNP was 1400. CTA chest show b/l pleural effusion and atelectasis. Na+ was 123. He was started on lasix. TRH was called for admission.   Review of Systems:  Denies CP, palpitations abdominal pain, N/V/D, fevers, sick contacts.  Review of systems is otherwise negative for all not mentioned in HPI.   PMHx Past Medical History:  Diagnosis Date   Anxiety    Coronary artery disease    a. s/p CABG;  b. LHC (2/11):  LM 30-40, pLAD 95-99 then 80 and 90, mCFX 90, pRCA occluded, S-dRCA ok, S-D1 ok, S-OM patent with 50, L-LAD ok.  Med Rx recommended.  c.  Myoview (5/13):  Low risk with small fixed inf defect c/w scar, no ischemia, EF 48%.  d.  Echo (6/13):  EF 55-60%, inf HK, Gr 1 DD, MAC;  e. Lexiscan Myoview (10/14):  Low risk, EF 38%, inferobasal infarct, no ischemia.   Hyperlipidemia    Hypertension    Hyponatremia 09/2012   Associated with postural hypotension and confusion   Insomnia    Myocardial infarction [facility]) 2003   hx of    Renal artery stenosis (HCC)    bilateral;  s/p R RA stent 2011   Seizure ([place]) 2015   unknow etiology  and year of seizure???   Syncope and collapse 32/44/0102   Systolic heart failure ([place])    CHRONIC   TIA (transient ischemic attack)     PSHx Past Surgical History:  Procedure Laterality Date   CARDIAC CATHETERIZATION  2003   revdeling severe three-vessel disease   CORONARY ARTERY BYPASS GRAFT     LIMA to LAD, SVG to diagonal, SVG to RCA, and SVG to circumflex   POLYPECTOMY     colon   RENAL ARTERY STENT  01-28-10    SocHx  reports that he has never smoked. He has never used smokeless tobacco. He reports that he does not drink alcohol and does not use drugs.  Allergies  Allergen Reactions   Lisinopril Cough    Renal artery stenosis by history; ACE inhibitor  would be relatively contraindicated   Zithromax [Azithromycin]     02/15/14 N&V   Niacin Other (See Comments)    Burning sensations in head    FamHx Family History  Problem Relation Age of Onset   Diabetes Mother    Stroke Mother    Heart attack Father 85       died   Colon cancer Other        paternal grandfather    Prior to Admission  medications   Medication Sig Start Date End Date Taking? Authorizing Provider  albuterol (VENTOLIN HFA) 108 (90 Base) MCG/ACT inhaler Inhale 2 puffs into the lungs every 4 (four) hours as needed. 11/18/21   [provider]  aspirin 81 MG tablet Take 81 mg by mouth at bedtime.    [provider]  Blood Pressure Monitoring (BLOOD PRESSURE CUFF) MISC 1 each by Does not apply route daily as needed. 09/09/21   Minus Breeding, MD  CALCIUM-MAGNESIUM-ZINC PO Take 1 tablet by mouth daily.    [provider]  calcium-vitamin D (OSCAL WITH D) 500-200 MG-UNIT TABS tablet Take by mouth.    [provider]  cholecalciferol (VITAMIN D) 1000 UNITS tablet Take 2,000 Units by mouth daily.    [provider]  ENTRESTO 49-51 MG TAKE 1 TABLET BY MOUTH TWICE DAILY 08/28/21   Minus Breeding, MD  ezetimibe (ZETIA) 10 MG tablet Take 0.5 tablets (5 mg total) by  mouth daily. 12/31/20   Copland, Gay Filler, MD  furosemide (LASIX) 20 MG tablet Take 20 mg by mouth daily. 11/27/21   [provider]  isosorbide mononitrate (ISMO) 10 MG tablet Take 10 mg by mouth 2 (two) times daily. 11/04/21   [provider]  levofloxacin (LEVAQUIN) 500 MG tablet Take 500 mg by mouth daily. 11/25/21   [provider]  Melatonin 3 MG TBDP Take 1 tablet by mouth at bedtime as needed (sleep). Reported on 04/29/2016    [provider]  nitroGLYCERIN (NITROSTAT) 0.4 MG SL tablet DISSOLVE 1 TABLET UNDER THE TONGUE AS NEEDED FOR CHEST PAIN. MAY REPEAT IN 5 MINUTES AS DIRECTED. ER IF NO BETTER. 08/15/19   Minus Breeding, MD  Omega-3 Fatty Acids (FISH OIL) 1200 MG CPDR Take 1 capsule by mouth daily.    [provider]  vitamin C (ASCORBIC ACID) 500 MG tablet Take 500 mg by mouth daily.    [provider]    Physical Exam: Vitals:   12/01/21 0630 12/01/21 0645 12/01/21 0700 12/01/21 1000  BP: (!) 143/93 122/70 (!) 142/78 137/78  Pulse: 88 (!) 45 91 89  Resp: (!) _0 Temp:      TempSrc:      SpO2: 99% 96% 99% 99%    General: 87 y.o. male resting in bed in NAD Eyes: PERRL, normal sclera ENMT: Nares patent w/o discharge, orophaynx clear, dentition normal, ears w/o discharge/lesions/ulcers Neck: Supple, trachea midline Cardiovascular: RRR, +S1, S2, no m/g/r, equal pulses throughout Respiratory: decreased at bases, upper airway transmission, normal WOB GI: BS+, NDNT, no masses noted, no organomegaly noted MSK: No e/c/c Neuro: A&O x 2, hard of hearing, otherwise no focal deficits Psyc: Appropriate interaction and affect, calm/cooperative  Labs on Admission: I have personally reviewed following labs and imaging studies  CBC: Recent Labs  Lab 12/01/21 0722  WBC 3.9*  NEUTROABS 2.8  HGB 12.4*  HCT 35.5*  MCV 87.4  PLT 256*   Basic Metabolic Panel: Recent Labs  Lab 12/01/21 0722  NA 123*  K 3.6  CL 88*   CO2 25  GLUCOSE 117*  BUN 18  CREATININE 0.99  CALCIUM 8.7*   GFR: CrCl cannot be calculated (Unknown ideal weight.). Liver Function Tests: Recent Labs  Lab 12/01/21 0722  AST 25  ALT 18  ALKPHOS 47  BILITOT 0.7  PROT 5.8*  ALBUMIN 3.5   No results for input(s): LIPASE, AMYLASE in the last 168 hours. Recent Labs  Lab 12/01/21 0722  AMMONIA <  10   Coagulation Profile: No results for input(s): INR, PROTIME in the last 168 hours. Cardiac Enzymes: No results for input(s): CKTOTAL, CKMB, CKMBINDEX, TROPONINI in the last 168 hours. BNP (last 3 results) No results for input(s): PROBNP in the last 8760 hours. HbA1C: No results for input(s): HGBA1C in the last 72 hours. CBG: No results for input(s): GLUCAP in the last 168 hours. Lipid Profile: No results for input(s): CHOL, HDL, LDLCALC, TRIG, CHOLHDL, LDLDIRECT in the last 72 hours. Thyroid Function Tests: No results for input(s): TSH, T4TOTAL, FREET4, T3FREE, THYROIDAB in the last 72 hours. Anemia Panel: No results for input(s): VITAMINB12, FOLATE, FERRITIN, TIBC, IRON, RETICCTPCT in the last 72 hours. Urine analysis:    Component Value Date/Time   COLORURINE YELLOW 01/20/2019 [place] 01/20/2019 0937   LABSPEC 1.008 01/20/2019 0937   PHURINE 6.0 01/20/2019 0937   GLUCOSEU NEGATIVE 01/20/2019 0937   GLUCOSEU NEGATIVE 03/29/2017 1404   HGBUR NEGATIVE 01/20/2019 0937   HGBUR negative 07/06/2010 1402   BILIRUBINUR NEGATIVE 01/20/2019 0937   BILIRUBINUR negative 05/29/2018 1252   KETONESUR NEGATIVE 01/20/2019 0937   PROTEINUR NEGATIVE 01/20/2019 0937   UROBILINOGEN 0.2 05/29/2018 1252   UROBILINOGEN 0.2 03/29/2017 1404   NITRITE NEGATIVE 01/20/2019 0937   LEUKOCYTESUR NEGATIVE 01/20/2019 0272    Radiological Exams on Admission: CT HEAD WO CONTRAST (5MM)  Result Date: 12/01/2021 CLINICAL DATA:  Mental status change of unknown cause Increasing confusion EXAM: CT HEAD WITHOUT CONTRAST TECHNIQUE:  Contiguous axial images were obtained from the base of the skull through the vertex without intravenous contrast. COMPARISON:  01/20/2019 FINDINGS: Brain: No evidence of acute infarction, hemorrhage, hydrocephalus, extra-axial collection or mass lesion/mass effect. Periventricular white matter hypodensity is a nonspecific finding, but most commonly relates to chronic ischemic small vessel disease. Diffuse cerebral atrophy again seen. Vascular: No hyperdense vessel or unexpected calcification. Skull: Normal. Negative for fracture or focal lesion. Sinuses/Orbits: Partial opacification of the right posterior ethmoid air cells. Visualized paranasal sinuses and mastoid air cells otherwise well aerated. Other: None. IMPRESSION: No acute intracranial abnormality. Electronically Signed   By: Miachel Roux M.D.   On: 12/01/2021 08:41   CT Angio Chest PE W and/or Wo Contrast  Result Date: 12/01/2021 CLINICAL DATA:  Abnormal chest x-ray EXAM: CT ANGIOGRAPHY CHEST WITH CONTRAST TECHNIQUE: Multidetector CT imaging of the chest was performed using the standard protocol during bolus administration of intravenous contrast. Multiplanar CT image reconstructions and MIPs were obtained to evaluate the vascular anatomy. CONTRAST:  9m OMNIPAQUE IOHEXOL 350 MG/ML SOLN COMPARISON:  Previous studies including the chest radiograph done on 12/01/2021 FINDINGS: Cardiovascular: Heart is enlarged in size. There is reflux of contrast into hepatic veins. Coronary artery calcifications are seen. There is evidence of previous coronary bypass surgery. There are no intraluminal filling defects in the major pulmonary artery branches. Evaluation of small subsegmental peripheral branches in the lower lung fields is limited by infiltrates. Contrast density in thoracic aorta is less than adequate to evaluate the lumen. Mediastinum/Nodes: No significant lymphadenopathy seen in the mediastinum. Right hilar prominence seen in the chest radiograph most  likely is due to prominent central pulmonary arteries. Lungs/Pleura: Moderate bilateral pleural effusions are seen. Infiltrates seen in both lower lobes there is 14 mm smooth marginated lesion in the medial left upper lung fields, most likely fluid in the interlobar fissure. Upper Abdomen: Arterial calcifications are seen. Musculoskeletal: There is proximally 40% decrease in height of upper endplate of body of [id number] which was not  seen in the previous CT done in 2007. Review of the MIP images confirms the above findings. IMPRESSION: There is no evidence of pulmonary artery embolism. Moderate bilateral pleural effusions, possibly related to CHF. Infiltrates are seen in the both lower lung fields suggesting compression atelectasis or pneumonia. There is approximately 40% decrease in height of upper endplate of body of G66 vertebra which may be recent or old. There is reflux of contrast into hepatic veins suggesting tricuspid incompetence. Coronary artery calcifications are seen. There is evidence of previous coronary bypass surgery. Electronically Signed   By: Elmer Picker M.D.   On: 12/01/2021 08:58   DG Chest Port 1 View  Result Date: 12/01/2021 CLINICAL DATA:  edema EXAM: PORTABLE CHEST 1 VIEW COMPARISON:  January 20, 2019. FINDINGS: Mild enlargement of the cardiopericardial silhouette. CABG and median sternotomy. The calcific atherosclerosis of the aorta. Small bilateral pleural effusions. No visible pneumothorax. Right-sided skin fold. New masslike right hilar opacity. Mild interstitial opacities, including at the lateral lung bases. Streaky retrocardiac opacities. IMPRESSION: 1. New masslike right hilar opacity. Recommend CT chest (preferably with contrast) to exclude a mass/adenopathy. The 2. Mild cardiomegaly with small bilateral pleural effusions and possible mild interstitial edema. 3. Streaky retrocardiac opacities, favor atelectasis. Infection is not excluded. Electronically Signed   By:  Margaretha Sheffield M.D.   On: 12/01/2021 07:22    EKG: None obtained in ED  Assessment/Plan Acute on chronic HFrEF     - admit to inpt, progressive     - continue lasix, fluid restriction     - check echo, EKG     - trp is mildly elevated, but flat; no chest pain     - continue home entresto when confirmed  Hyponatremia     - he has volume overload     - will continue lasix and fluid restrictions     - got lasix before Urine studies could be obtained, but clinically fits w/ volume overload picture     - follow q6h renal function panel; limit increase to 8pt/day  B/l pleural effusions     - let's start with diuresis; if resp status does not improve here, consider sending for thoracentesis      - CT also show possible atelectasis vs PNA; no fever, his on RA, no cough, WBC is mildly low; let's check procal; holding abx for now     - add IS  Normocytic anemia     - no evidence of bleed; follow  Generalized weakness     - likely secondary to HF exacerbation     - will get PT/OT to review  CAD s/p CABG     - continue ASA, zetia when confirmed  Goals of care     - DNR     - wife is interested in outpt hospice vs palliative care services; will ask palliative care to see   DVT prophylaxis: SCDs  Code Status: DNR  Family Communication: w/ family at bedside Consults called: None   Status is: Inpatient  Remains inpatient appropriate because: severity of illness   Jonnie Finner DO [facility]  If 7PM-7AM, please contact night-coverage [email or web address]  12/01/2021, 10:20 AM

## 2021-12-02 ENCOUNTER — Other Ambulatory Visit (HOSPITAL_COMMUNITY): Payer: Medicare HMO

## 2021-12-02 DIAGNOSIS — Z7189 Other specified counseling: Secondary | ICD-10-CM

## 2021-12-02 DIAGNOSIS — Z515 Encounter for palliative care: Secondary | ICD-10-CM

## 2021-12-02 DIAGNOSIS — R531 Weakness: Secondary | ICD-10-CM

## 2021-12-02 DIAGNOSIS — I10 Essential (primary) hypertension: Secondary | ICD-10-CM

## 2021-12-02 LAB — CBC
HCT: 38.4 % — ABNORMAL LOW (ref 39.0–52.0)
Hemoglobin: 13.3 g/dL (ref 13.0–17.0)
MCH: 30.1 pg (ref 26.0–34.0)
MCHC: 34.6 g/dL (ref 30.0–36.0)
MCV: 86.9 fL (ref 80.0–100.0)
Platelets: 154 10*3/uL (ref 150–400)
RBC: 4.42 MIL/uL (ref 4.22–5.81)
RDW: 14.6 % (ref 11.5–15.5)
WBC: 5.6 10*3/uL (ref 4.0–10.5)
nRBC: 0 % (ref 0.0–0.2)

## 2021-12-02 LAB — RENAL FUNCTION PANEL
Albumin: 3.6 g/dL (ref 3.5–5.0)
Anion gap: 11 (ref 5–15)
BUN: 17 mg/dL (ref 8–23)
CO2: 27 mmol/L (ref 22–32)
Calcium: 8.8 mg/dL — ABNORMAL LOW (ref 8.9–10.3)
Chloride: 87 mmol/L — ABNORMAL LOW (ref 98–111)
Creatinine, Ser: 0.95 mg/dL (ref 0.61–1.24)
GFR, Estimated: >60 mL/min (ref >60)
Glucose, Bld: 114 mg/dL — ABNORMAL HIGH (ref 70–99)
Phosphorus: 3.5 mg/dL (ref 2.5–4.6)
Potassium: 3.3 mmol/L — ABNORMAL LOW (ref 3.5–5.1)
Sodium: 125 mmol/L — ABNORMAL LOW (ref 135–145)

## 2021-12-02 MED ORDER — POTASSIUM CHLORIDE CRYS ER 20 MEQ PO TBCR
40.0000 meq | EXTENDED_RELEASE_TABLET | Freq: Once | ORAL | Status: AC
  Administered 2021-12-02: 11:00:00 40 meq via ORAL
  Filled 2021-12-02: qty 2

## 2021-12-02 MED ORDER — ALBUTEROL SULFATE HFA 108 (90 BASE) MCG/ACT IN AERS: 2.0000 | INHALATION_SPRAY | RESPIRATORY_TRACT | Status: DC | PRN

## 2021-12-02 MED ORDER — ENOXAPARIN SODIUM 40 MG/0.4ML IJ SOSY
40.0000 mg | PREFILLED_SYRINGE | INTRAMUSCULAR | Status: DC
  Administered 2021-12-02 – 2021-12-07 (×6): 40 mg via SUBCUTANEOUS
  Filled 2021-12-02 (×6): qty 0.4

## 2021-12-02 MED ORDER — SACUBITRIL-VALSARTAN 49-51 MG PO TABS
1.0000 | ORAL_TABLET | Freq: Two times a day (BID) | ORAL | Status: DC
  Administered 2021-12-02 – 2021-12-07 (×11): 1 via ORAL
  Filled 2021-12-02 (×14): qty 1

## 2021-12-02 MED ORDER — ONDANSETRON HCL 4 MG/2ML IJ SOLN: 4.0000 mg | Freq: Four times a day (QID) | INTRAMUSCULAR | Status: DC | PRN

## 2021-12-02 MED ORDER — POTASSIUM CHLORIDE CRYS ER 20 MEQ PO TBCR
40.0000 meq | EXTENDED_RELEASE_TABLET | Freq: Once | ORAL | Status: AC
  Administered 2021-12-02: 22:00:00 40 meq via ORAL
  Filled 2021-12-02: qty 2

## 2021-12-02 MED ORDER — ASPIRIN EC 81 MG PO TBEC
81.0000 mg | DELAYED_RELEASE_TABLET | Freq: Every day | ORAL | Status: DC
  Administered 2021-12-02 – 2021-12-07 (×6): 81 mg via ORAL
  Filled 2021-12-02 (×6): qty 1

## 2021-12-02 NOTE — Progress Notes (Addendum)
PROGRESS NOTE  Victor Waters FIE:[id number] DOB: [birth year] DOA: 12/01/2021 PCP: Sueanne Margarita, DO   LOS: 1 day   Brief narrative:  Victor Waters is a 87 y.o. male with past medical history of coronary artery disease status post CABG, hyperlipidemia, hypertension, chronic systolic heart failure presented to hospital with shortness of breath and dyspnea ongoing for the last 2 months.  He was last seen by his primary care physician who told that he had fluid in his lungs and pneumonia and was given some antibiotic and fluid.will improve but then started having increasing shortness of breath for 1 day so he was brought into the hospital.  In the ED, patient was noted to have BNP of 1400.  Sodium was low.  CT chest showed bilateral pleural effusion and atelectasis.  Patient was started on Lasix and hospitalist team was consulted for further evaluation and treatment.   Assessment/Plan:  Principal Problem:   Acute CHF (congestive heart failure) (HCC)   Acute on chronic HFrEF Continue CHF protocol.  Strict intake and output charting.  Daily weights.   Check 2D echocardiogram.   COVID and influenza was negative.continue home entresto.  He takes Lasix 20 mg oral daily at home.  Continue with IV diuretics while in hospital.  Mild hypokalemia.  We will replenish orally.  Check levels in a.m. we will replace orally again in p.m.   Hyponatremia Likely secondary to hypervolemic hyponatremia.  Continue fluid restriction IV Lasix.  Continue to monitor closely.  Urinary sodium was 92.  Check BMP in AM.  B/l pleural effusions CT scan of the chest showing bilateral pleural effusion with atelectasis.  Continue with IV diuresis for now.  If not improving could consider thoracocentesis.  Continue incentive spirometry.  Procalcitonin was less than 0.10.  No leukocytosis or fever.  Normocytic anemia Latest hemoglobin of 12.8.   Generalized weakness Likely secondary to congestive heart failure  exacerbation.  No evidence of infection.  UA was negative.  Procalcitonin less than 0.10.  Physical therapy Dr Learta Codding therapy has been consulted.  CAD s/p CABG Will resume aspirin.  Debility, deconditioning. Will get PT evaluation.  Patient's wife states that she is unable to take care of him at home.  Might need skilled nursing facility placement.  Patient does have a walker at home for ambulation.  DVT prophylaxis: enoxaparin (LOVENOX) injection 30 mg Start: 12/01/21 2200  Code Status: DNR.  Wife is interested in outpatient hospice versus palliative care services.  Palliative care has been consulted.  Might need skilled nursing facility placement.  Family Communication: Spoke with the patient's wife at bedside.  Status is: Inpatient  Remains inpatient appropriate because: Congestive heart failure, IV diuresis,  Consultants: None  Procedures: None  Anti-infectives:  None  Anti-infectives (From admission, onward)    None      Subjective: Today, patient was seen and examined at bedside.  Patient still complains of mild shortness of breath and congestion but better than yesterday, patient's wife at bedside, has been urinating okay.   Objective: Vitals:   12/02/21 0745 12/02/21 0800  BP: 116/63 124/75  Pulse: 95 93  Resp: 18 18  Temp:    SpO2: 90% 96%   No intake or output data in the 24 hours ending 12/02/21 0902 Filed Weights   12/02/21 0350  Weight: 65.3 kg   Body mass index is 24.72 kg/m.   Physical Exam:  GENERAL: Patient is alert awake and oriented. Not in obvious distress.  Hard of hearing, elderly  male lying propped up in bed HENT: No scleral pallor or icterus. Pupils equally reactive to light. Oral mucosa is moist NECK: is supple, no gross swelling noted. CHEST:  Diminished breath sounds bilaterally.  Coarse breath sounds noted bilaterally with crackles. CVS: S1 and S2 heard, no murmur. Regular rate and rhythm.  ABDOMEN: Soft, non-tender, bowel  sounds are present. EXTREMITIES: Trace lower extremity edema noted. CNS: Cranial nerves are intact. No focal motor deficits. SKIN: warm and dry without rashes.  Data Review: I have personally reviewed the following laboratory data and studies,  CBC: Recent Labs  Lab 12/01/21 0722 12/01/21 2231 12/02/21 0500  WBC 3.9* 4.7 5.6  NEUTROABS 2.8  --   --   HGB 12.4* 12.8* 13.3  HCT 35.5* 36.6* 38.4*  MCV 87.4 86.1 86.9  PLT 145* 150 952   Basic Metabolic Panel: Recent Labs  Lab 12/01/21 0722 12/01/21 2231  NA 123*  --   K 3.6  --   CL 88*  --   CO2 25  --   GLUCOSE 117*  --   BUN 18  --   CREATININE 0.99 0.94  CALCIUM 8.7*  --    Liver Function Tests: Recent Labs  Lab 12/01/21 0722  AST 25  ALT 18  ALKPHOS 47  BILITOT 0.7  PROT 5.8*  ALBUMIN 3.5   No results for input(s): LIPASE, AMYLASE in the last 168 hours. Recent Labs  Lab 12/01/21 0722  AMMONIA <10   Cardiac Enzymes: No results for input(s): CKTOTAL, CKMB, CKMBINDEX, TROPONINI in the last 168 hours. BNP (last 3 results) Recent Labs    12/01/21 0722  BNP 1,433.5*    ProBNP (last 3 results) No results for input(s): PROBNP in the last 8760 hours.  CBG: No results for input(s): GLUCAP in the last 168 hours. Recent Results (from the past 240 hour(s))  Resp Panel by RT-PCR (Flu A&B, Covid) Nasopharyngeal Swab     Status: None   Collection Time: 12/01/21  9:37 AM   Specimen: Nasopharyngeal Swab; Nasopharyngeal(NP) swabs in vial transport medium  Result Value Ref Range Status   SARS Coronavirus 2 by RT PCR NEGATIVE NEGATIVE Final    Comment: (NOTE) SARS-CoV-2 target nucleic acids are NOT DETECTED.  The SARS-CoV-2 RNA is generally detectable in upper respiratory specimens during the acute phase of infection. The lowest concentration of SARS-CoV-2 viral copies this assay can detect is 138 copies/mL. A negative result does not preclude SARS-Cov-2 infection and should not be used as the sole basis for  treatment or other patient management decisions. A negative result may occur with  improper specimen collection/handling, submission of specimen other than nasopharyngeal swab, presence of viral mutation(s) within the areas targeted by this assay, and inadequate number of viral copies(<138 copies/mL). A negative result must be combined with clinical observations, patient history, and epidemiological information. The expected result is Negative.  Fact Sheet for Patients:  EntrepreneurPulse.com.au  Fact Sheet for Healthcare Providers:  IncredibleEmployment.be  This test is no t yet approved or cleared by the [place] FDA and  has been authorized for detection and/or diagnosis of SARS-CoV-2 by FDA under an Emergency Use Authorization (EUA). This EUA will remain  in effect (meaning this test can be used) for the duration of the COVID-19 declaration under Section 564(b)(1) of the Act, 21 U.S.C.section 360bbb-3(b)(1), unless the authorization is terminated  or revoked sooner.       Influenza A by PCR NEGATIVE NEGATIVE Final   Influenza B by PCR NEGATIVE  NEGATIVE Final    Comment: (NOTE) The Xpert Xpress SARS-CoV-2/FLU/RSV plus assay is intended as an aid in the diagnosis of influenza from Nasopharyngeal swab specimens and should not be used as a sole basis for treatment. Nasal washings and aspirates are unacceptable for Xpert Xpress SARS-CoV-2/FLU/RSV testing.  Fact Sheet for Patients: EntrepreneurPulse.com.au  Fact Sheet for Healthcare Providers: IncredibleEmployment.be  This test is not yet approved or cleared by the [place] FDA and has been authorized for detection and/or diagnosis of SARS-CoV-2 by FDA under an Emergency Use Authorization (EUA). This EUA will remain in effect (meaning this test can be used) for the duration of the COVID-19 declaration under Section 564(b)(1) of the Act, 21  U.S.C. section 360bbb-3(b)(1), unless the authorization is terminated or revoked.  Performed at [facility], [place] [address], [place], [place] [address]      Studies: CT HEAD WO CONTRAST (5MM)  Result Date: 12/01/2021 CLINICAL DATA:  Mental status change of unknown cause Increasing confusion EXAM: CT HEAD WITHOUT CONTRAST TECHNIQUE: Contiguous axial images were obtained from the base of the skull through the vertex without intravenous contrast. COMPARISON:  01/20/2019 FINDINGS: Brain: No evidence of acute infarction, hemorrhage, hydrocephalus, extra-axial collection or mass lesion/mass effect. Periventricular white matter hypodensity is a nonspecific finding, but most commonly relates to chronic ischemic small vessel disease. Diffuse cerebral atrophy again seen. Vascular: No hyperdense vessel or unexpected calcification. Skull: Normal. Negative for fracture or focal lesion. Sinuses/Orbits: Partial opacification of the right posterior ethmoid air cells. Visualized paranasal sinuses and mastoid air cells otherwise well aerated. Other: None. IMPRESSION: No acute intracranial abnormality. Electronically Signed   By: Miachel Roux M.D.   On: 12/01/2021 08:41   CT Angio Chest PE W and/or Wo Contrast  Result Date: 12/01/2021 CLINICAL DATA:  Abnormal chest x-ray EXAM: CT ANGIOGRAPHY CHEST WITH CONTRAST TECHNIQUE: Multidetector CT imaging of the chest was performed using the standard protocol during bolus administration of intravenous contrast. Multiplanar CT image reconstructions and MIPs were obtained to evaluate the vascular anatomy. CONTRAST:  70mL OMNIPAQUE IOHEXOL 350 MG/ML SOLN COMPARISON:  Previous studies including the chest radiograph done on 12/01/2021 FINDINGS: Cardiovascular: Heart is enlarged in size. There is reflux of contrast into hepatic veins. Coronary artery calcifications are seen. There is evidence of previous coronary bypass surgery. There are no intraluminal filling  defects in the major pulmonary artery branches. Evaluation of small subsegmental peripheral branches in the lower lung fields is limited by infiltrates. Contrast density in thoracic aorta is less than adequate to evaluate the lumen. Mediastinum/Nodes: No significant lymphadenopathy seen in the mediastinum. Right hilar prominence seen in the chest radiograph most likely is due to prominent central pulmonary arteries. Lungs/Pleura: Moderate bilateral pleural effusions are seen. Infiltrates seen in both lower lobes there is 14 mm smooth marginated lesion in the medial left upper lung fields, most likely fluid in the interlobar fissure. Upper Abdomen: Arterial calcifications are seen. Musculoskeletal: There is proximally 40% decrease in height of upper endplate of body of P61 vertebra which was not seen in the previous CT done in 2007. Review of the MIP images confirms the above findings. IMPRESSION: There is no evidence of pulmonary artery embolism. Moderate bilateral pleural effusions, possibly related to CHF. Infiltrates are seen in the both lower lung fields suggesting compression atelectasis or pneumonia. There is approximately 40% decrease in height of upper endplate of body of P50 vertebra which may be recent or old. There is reflux of contrast into hepatic veins suggesting tricuspid  incompetence. Coronary artery calcifications are seen. There is evidence of previous coronary bypass surgery. Electronically Signed   By: Elmer Picker M.D.   On: 12/01/2021 08:58   DG Chest Port 1 View  Result Date: 12/01/2021 CLINICAL DATA:  edema EXAM: PORTABLE CHEST 1 VIEW COMPARISON:  January 20, 2019. FINDINGS: Mild enlargement of the cardiopericardial silhouette. CABG and median sternotomy. The calcific atherosclerosis of the aorta. Small bilateral pleural effusions. No visible pneumothorax. Right-sided skin fold. New masslike right hilar opacity. Mild interstitial opacities, including at the lateral lung bases.  Streaky retrocardiac opacities. IMPRESSION: 1. New masslike right hilar opacity. Recommend CT chest (preferably with contrast) to exclude a mass/adenopathy. The 2. Mild cardiomegaly with small bilateral pleural effusions and possible mild interstitial edema. 3. Streaky retrocardiac opacities, favor atelectasis. Infection is not excluded. Electronically Signed   By: Margaretha Sheffield M.D.   On: 12/01/2021 07:22      Flora Lipps, MD  [facility] 12/02/2021  If 7PM-7AM, please contact night-coverage

## 2021-12-02 NOTE — ED Notes (Addendum)
Pt trying to get get out of bed, looking for his wife and kids. RN asked pt how old are his kids and patient states 4 and 9. Reoriented patient and repositioned up on the bed.

## 2021-12-02 NOTE — ED Notes (Signed)
Pt's wife going home. Message relayed to wife, states she'll call granddaughter when she gets home.

## 2021-12-02 NOTE — ED Notes (Signed)
Pt's granddaughter Sharyn Lull [phone] is requesting for pt's wife to call her.

## 2021-12-03 ENCOUNTER — Telehealth: Payer: Medicare HMO | Admitting: Cardiology

## 2021-12-03 ENCOUNTER — Other Ambulatory Visit (HOSPITAL_COMMUNITY): Payer: Medicare HMO

## 2021-12-03 DIAGNOSIS — E871 Hypo-osmolality and hyponatremia: Secondary | ICD-10-CM

## 2021-12-03 DIAGNOSIS — R4182 Altered mental status, unspecified: Secondary | ICD-10-CM

## 2021-12-03 DIAGNOSIS — R0600 Dyspnea, unspecified: Secondary | ICD-10-CM

## 2021-12-03 LAB — BASIC METABOLIC PANEL WITH GFR
Anion gap: 13 (ref 5–15)
BUN: 23 mg/dL (ref 8–23)
CO2: 24 mmol/L (ref 22–32)
Calcium: 9 mg/dL (ref 8.9–10.3)
Chloride: 85 mmol/L — ABNORMAL LOW (ref 98–111)
Creatinine, Ser: 1 mg/dL (ref 0.61–1.24)
GFR, Estimated: >60 mL/min
Glucose, Bld: 108 mg/dL — ABNORMAL HIGH (ref 70–99)
Potassium: 3.8 mmol/L (ref 3.5–5.1)
Sodium: 122 mmol/L — ABNORMAL LOW (ref 135–145)

## 2021-12-03 LAB — CBC
HCT: 38.2 % — ABNORMAL LOW (ref 39.0–52.0)
Hemoglobin: 13.1 g/dL (ref 13.0–17.0)
MCH: 30.3 pg (ref 26.0–34.0)
MCHC: 34.3 g/dL (ref 30.0–36.0)
MCV: 88.4 fL (ref 80.0–100.0)
Platelets: 148 10*3/uL — ABNORMAL LOW (ref 150–400)
RBC: 4.32 MIL/uL (ref 4.22–5.81)
RDW: 14.4 % (ref 11.5–15.5)
WBC: 6.8 10*3/uL (ref 4.0–10.5)
nRBC: 0 % (ref 0.0–0.2)

## 2021-12-03 LAB — MAGNESIUM: Magnesium: 1.9 mg/dL (ref 1.7–2.4)

## 2021-12-03 LAB — PHOSPHORUS: Phosphorus: 3.2 mg/dL (ref 2.5–4.6)

## 2021-12-03 LAB — BRAIN NATRIURETIC PEPTIDE: B Natriuretic Peptide: 2022 pg/mL — ABNORMAL HIGH (ref 0.0–100.0)

## 2021-12-03 MED ORDER — POTASSIUM CHLORIDE CRYS ER 20 MEQ PO TBCR
20.0000 meq | EXTENDED_RELEASE_TABLET | Freq: Every day | ORAL | Status: DC
  Administered 2021-12-03 – 2021-12-07 (×5): 20 meq via ORAL
  Filled 2021-12-03 (×5): qty 1

## 2021-12-03 NOTE — Plan of Care (Signed)
  Problem: Education: Goal: Knowledge of General Education information will improve Description Including pain rating scale, medication(s)/side effects and non-pharmacologic comfort measures Outcome: Progressing   Problem: Clinical Measurements: Goal: Ability to maintain clinical measurements within normal limits will improve Outcome: Progressing   Problem: Activity: Goal: Risk for activity intolerance will decrease Outcome: Progressing   

## 2021-12-03 NOTE — Progress Notes (Signed)
PROGRESS NOTE  Victor Waters HYW:[id number] DOB: 01-07-1926 DOA: 12/01/2021 PCP: Sueanne Margarita, DO   LOS: 2 days   Brief narrative:  Victor Waters is a 87 y.o. male with past medical history of coronary artery disease status post CABG, hyperlipidemia, hypertension, chronic systolic heart failure presented to the hospital with shortness of breath and dyspnea ongoing for the last 2 months.  He was last seen by his primary care physician who told that he had fluid in his lungs and pneumonia and was given some antibiotic and fluid pills.Patient however had increasing shortness of breath for 1 day so he was brought into the hospital.  In the ED, patient was noted to have BNP of 1400.  Sodium was low.  CT chest showed bilateral pleural effusion and atelectasis.  Patient was started on Lasix and hospitalist team was consulted for further evaluation and treatment.   Assessment/Plan:  Principal Problem:   Acute CHF (congestive heart failure) (HCC)  Acute on chronic HFrEF Continue CHF protocol.  Strict intake and output charting.  Daily weights.   Pending 2D echocardiogram.   COVID and influenza was negative.  Patient takes oral Lasix at home.   Delene Loll has been resumed.  Continue IV Lasix while in the hospital.  1 KG weight loss documented in the computer.  As per nurse charting negative balance for 300 mL.  Mild hypokalemia.  Improved after replacement.  Potassium 3.8 today.  Continue to replenish orally   Hyponatremia Thought to be secondary to hypervolemic hyponatremia.  Continue fluid restriction, IV Lasix.  Continue to monitor closely.  Urinary sodium was 92.  We will continue to monitor sodium levels in a.m.  We will check TSH  B/l pleural effusions CT scan of the chest showing bilateral pleural effusion with atelectasis.  Continue with IV diuresis for now.  If not improving, could consider thoracocentesis.  Continue incentive spirometry.  Procalcitonin was less than 0.10.  No  leukocytosis or fever.  No indication for antibiotic at this time.  Normocytic anemia Latest hemoglobin of 13.1.   Generalized weakness/Debility, deconditioning. Likely secondary to congestive heart failure exacerbation.  No evidence of infection.  UA was negative.  Procalcitonin less than 0.10.  Physical therapy has been consulted for evaluation.  Patient wife stating that she is unable to take care of him at home and requesting additional help/palliative care/skilled nursing facility   CAD s/p CABG Continue aspirin  Disposition likely to skilled nursing facility.  PT evaluation pending.  Spoke at the progression rounds.  DVT prophylaxis: enoxaparin (LOVENOX) injection 40 mg Start: 12/02/21 2200  Code Status: DNR.  Wife is interested in outpatient hospice versus palliative care services.  Palliative care has been consulted.  Likely will need skilled nursing facility placement.  Family Communication:  Spoke with the patient's wife at bedside on 12/02/21.  Status is: Inpatient  Remains inpatient appropriate because: Congestive heart failure, IV diuresis, hyponatremia  Consultants: None  Procedures: None  Anti-infectives:  None  Anti-infectives (From admission, onward)    None      Subjective: Today, patient was seen and examined at bedside.  States that he feels a little better with breathing.  Was eating food at the time of my evaluation.  Denies any chest pain or wheezing or cough today  Objective: Vitals:   12/03/21 0545 12/03/21 0612  BP: (!) 114/49   Pulse: (!) 102   Resp:    Temp: 98.2 F (36.8 C)   SpO2: 92% 96%  Intake/Output Summary (Last 24 hours) at 12/03/2021 0816 Last data filed at 12/03/2021 0600 Gross per 24 hour  Intake 300 ml  Output 600 ml  Net -300 ml   Filed Weights   12/02/21 0350 12/03/21 0349  Weight: 65.3 kg 64.1 kg   Body mass index is 24.26 kg/m.   Physical Exam:  GENERAL: Patient is alert awake and oriented. Not in  obvious distress.  Mildly hard of hearing, eating food at the time of exam.  Not in obvious distress. HENT: No scleral pallor or icterus. Pupils equally reactive to light. Oral mucosa is moist NECK: is supple, no gross swelling noted. CHEST: Diminished breath sounds bilaterally with mild basal crackles CVS: S1 and S2 heard, no murmur. Regular rate and rhythm.  ABDOMEN: Soft, non-tender, bowel sounds are present. EXTREMITIES: Trace bilateral lower extremity edema noted.  Improved CNS: Cranial nerves are intact. No focal motor deficits. SKIN: warm and dry without rashes.  Data Review: I have personally reviewed the following laboratory data and studies,  CBC: Recent Labs  Lab 12/01/21 0722 12/01/21 2231 12/02/21 0500 12/03/21 0439  WBC 3.9* 4.7 5.6 6.8  NEUTROABS 2.8  --   --   --   HGB 12.4* 12.8* 13.3 13.1  HCT 35.5* 36.6* 38.4* 38.2*  MCV 87.4 86.1 86.9 88.4  PLT 145* 150 154 148*    Basic Metabolic Panel: Recent Labs  Lab 12/01/21 0722 12/01/21 2231 12/02/21 0830 12/03/21 0439  NA 123*  --  125* 122*  K 3.6  --  3.3* 3.8  CL 88*  --  87* 85*  CO2 25  --  27 24  GLUCOSE 117*  --  114* 108*  BUN 18  --  17 23  CREATININE 0.99 0.94 0.95 1.00  CALCIUM 8.7*  --  8.8* 9.0  MG  --   --   --  1.9  PHOS  --   --  3.5 3.2    Liver Function Tests: Recent Labs  Lab 12/01/21 0722 12/02/21 0830  AST 25  --   ALT 18  --   ALKPHOS 47  --   BILITOT 0.7  --   PROT 5.8*  --   ALBUMIN 3.5 3.6    No results for input(s): LIPASE, AMYLASE in the last 168 hours. Recent Labs  Lab 12/01/21 0722  AMMONIA <10    Cardiac Enzymes: No results for input(s): CKTOTAL, CKMB, CKMBINDEX, TROPONINI in the last 168 hours. BNP (last 3 results) Recent Labs    12/01/21 0722  BNP 1,433.5*     ProBNP (last 3 results) No results for input(s): PROBNP in the last 8760 hours.  CBG: No results for input(s): GLUCAP in the last 168 hours. Recent Results (from the past 240 hour(s))   Resp Panel by RT-PCR (Flu A&B, Covid) Nasopharyngeal Swab     Status: None   Collection Time: 12/01/21  9:37 AM   Specimen: Nasopharyngeal Swab; Nasopharyngeal(NP) swabs in vial transport medium  Result Value Ref Range Status   SARS Coronavirus 2 by RT PCR NEGATIVE NEGATIVE Final    Comment: (NOTE) SARS-CoV-2 target nucleic acids are NOT DETECTED.  The SARS-CoV-2 RNA is generally detectable in upper respiratory specimens during the acute phase of infection. The lowest concentration of SARS-CoV-2 viral copies this assay can detect is 138 copies/mL. A negative result does not preclude SARS-Cov-2 infection and should not be used as the sole basis for treatment or other patient management decisions. A negative result may occur with  improper specimen collection/handling, submission of specimen other than nasopharyngeal swab, presence of viral mutation(s) within the areas targeted by this assay, and inadequate number of viral copies(<138 copies/mL). A negative result must be combined with clinical observations, patient history, and epidemiological information. The expected result is Negative.  Fact Sheet for Patients:  EntrepreneurPulse.com.au  Fact Sheet for Healthcare Providers:  IncredibleEmployment.be  This test is no t yet approved or cleared by the [place] FDA and  has been authorized for detection and/or diagnosis of SARS-CoV-2 by FDA under an Emergency Use Authorization (EUA). This EUA will remain  in effect (meaning this test can be used) for the duration of the COVID-19 declaration under Section 564(b)(1) of the Act, 21 U.S.C.section 360bbb-3(b)(1), unless the authorization is terminated  or revoked sooner.       Influenza A by PCR NEGATIVE NEGATIVE Final   Influenza B by PCR NEGATIVE NEGATIVE Final    Comment: (NOTE) The Xpert Xpress SARS-CoV-2/FLU/RSV plus assay is intended as an aid in the diagnosis of influenza from  Nasopharyngeal swab specimens and should not be used as a sole basis for treatment. Nasal washings and aspirates are unacceptable for Xpert Xpress SARS-CoV-2/FLU/RSV testing.  Fact Sheet for Patients: EntrepreneurPulse.com.au  Fact Sheet for Healthcare Providers: IncredibleEmployment.be  This test is not yet approved or cleared by the [place] FDA and has been authorized for detection and/or diagnosis of SARS-CoV-2 by FDA under an Emergency Use Authorization (EUA). This EUA will remain in effect (meaning this test can be used) for the duration of the COVID-19 declaration under Section 564(b)(1) of the Act, 21 U.S.C. section 360bbb-3(b)(1), unless the authorization is terminated or revoked.  Performed at [facility], [place] [address], [place], [place] [address]       Studies: CT HEAD WO CONTRAST (5MM)  Result Date: 12/01/2021 CLINICAL DATA:  Mental status change of unknown cause Increasing confusion EXAM: CT HEAD WITHOUT CONTRAST TECHNIQUE: Contiguous axial images were obtained from the base of the skull through the vertex without intravenous contrast. COMPARISON:  01/20/2019 FINDINGS: Brain: No evidence of acute infarction, hemorrhage, hydrocephalus, extra-axial collection or mass lesion/mass effect. Periventricular white matter hypodensity is a nonspecific finding, but most commonly relates to chronic ischemic small vessel disease. Diffuse cerebral atrophy again seen. Vascular: No hyperdense vessel or unexpected calcification. Skull: Normal. Negative for fracture or focal lesion. Sinuses/Orbits: Partial opacification of the right posterior ethmoid air cells. Visualized paranasal sinuses and mastoid air cells otherwise well aerated. Other: None. IMPRESSION: No acute intracranial abnormality. Electronically Signed   By: Miachel Roux M.D.   On: 12/01/2021 08:41   CT Angio Chest PE W and/or Wo Contrast  Result Date:  12/01/2021 CLINICAL DATA:  Abnormal chest x-ray EXAM: CT ANGIOGRAPHY CHEST WITH CONTRAST TECHNIQUE: Multidetector CT imaging of the chest was performed using the standard protocol during bolus administration of intravenous contrast. Multiplanar CT image reconstructions and MIPs were obtained to evaluate the vascular anatomy. CONTRAST:  77mL OMNIPAQUE IOHEXOL 350 MG/ML SOLN COMPARISON:  Previous studies including the chest radiograph done on 12/01/2021 FINDINGS: Cardiovascular: Heart is enlarged in size. There is reflux of contrast into hepatic veins. Coronary artery calcifications are seen. There is evidence of previous coronary bypass surgery. There are no intraluminal filling defects in the major pulmonary artery branches. Evaluation of small subsegmental peripheral branches in the lower lung fields is limited by infiltrates. Contrast density in thoracic aorta is less than adequate to evaluate the lumen. Mediastinum/Nodes: No significant lymphadenopathy seen in the mediastinum.  Right hilar prominence seen in the chest radiograph most likely is due to prominent central pulmonary arteries. Lungs/Pleura: Moderate bilateral pleural effusions are seen. Infiltrates seen in both lower lobes there is 14 mm smooth marginated lesion in the medial left upper lung fields, most likely fluid in the interlobar fissure. Upper Abdomen: Arterial calcifications are seen. Musculoskeletal: There is proximally 40% decrease in height of upper endplate of body of T88 vertebra which was not seen in the previous CT done in 2007. Review of the MIP images confirms the above findings. IMPRESSION: There is no evidence of pulmonary artery embolism. Moderate bilateral pleural effusions, possibly related to CHF. Infiltrates are seen in the both lower lung fields suggesting compression atelectasis or pneumonia. There is approximately 40% decrease in height of upper endplate of body of E28 vertebra which may be recent or old. There is reflux of  contrast into hepatic veins suggesting tricuspid incompetence. Coronary artery calcifications are seen. There is evidence of previous coronary bypass surgery. Electronically Signed   By: Elmer Picker M.D.   On: 12/01/2021 08:58      Flora Lipps, MD  [facility] 12/03/2021  If 7PM-7AM, please contact night-coverage

## 2021-12-03 NOTE — Progress Notes (Signed)
Daily Progress Note   Patient Name: Victor Waters       Date: 12/03/2021 DOB: Jul 02, 1926  Age: 87 y.o. MRN#: [id number] Attending Physician: Flora Lipps, MD Primary Care Physician: Sueanne Margarita, DO Admit Date: 12/01/2021  Reason for Consultation/Follow-up: Establishing goals of care  Subjective: I saw and examined Victor Waters and his wife.  I introduced palliative care as specialized medical care for people living with serious illness. It focuses on providing relief from the symptoms and stress of a serious illness. The goal is to improve quality of life for both the patient and the family.  We discussed clinical course as well as wishes moving forward in regard to advanced directives.  Concepts specific to code status and rehospitalization discussed.  We discussed difference between a aggressive medical intervention path and a palliative, comfort focused care path.  Values and goals of care important to patient and family were attempted to be elicited.   His wife reports that Victor Waters has been declining in his functional status and they have been in discussion with a hospice representative Company secretary) about starting home hospice services next week.  He got acutely worse and therefore was brought to the ED.  She shares that they had discussed previously about potential discharge to SNF for rehab, but she now thinks that he would do better to go home.  Encouraged that if her goal is to take him home, he would need to have home hospice arranged at time of discharge.  Concept of Hospice and Palliative Care were discussed   Questions and concerns addressed.   PMT will continue to support holistically.  Length of Stay: 2  Current Medications: Scheduled Meds:   aspirin EC  81 mg Oral QHS    enoxaparin (LOVENOX) injection  40 mg Subcutaneous Q24H   furosemide  40 mg Intravenous Q12H   potassium chloride  20 mEq Oral Daily   sacubitril-valsartan  1 tablet Oral BID    Continuous Infusions:   PRN Meds: acetaminophen **OR** acetaminophen, albuterol, ondansetron (ZOFRAN) IV  Physical Exam     General: Alert, awake, in no acute distress.   HEENT: No bruits, no goiter, no JVD. Bandage on head Heart: Regular rate and rhythm. No murmur appreciated. Lungs: Good air movement, clear Abdomen: Soft, nontender, nondistended, positive bowel sounds.   Ext:  No significant edema Skin: Warm and dry      Vital Signs: BP (!) 100/47 (BP Location: Left Arm)    Pulse 65    Temp 99.6 F (37.6 C) (Oral)    Resp 18    Ht 5\' 4"  (1.626 m)    Wt 64.1 kg    SpO2 (!) 84%    BMI 24.26 kg/m  SpO2: SpO2: (!) 84 % O2 Device: O2 Device: Room Air O2 Flow Rate:    Intake/output summary:  Intake/Output Summary (Last 24 hours) at 12/03/2021 2058 Last data filed at 12/03/2021 1827 Gross per 24 hour  Intake 1160 ml  Output 1550 ml  Net -390 ml   LBM: Last BM Date:  (pt cannot recall) Baseline Weight: Weight: 65.3 kg Most recent weight: Weight: 64.1 kg       Palliative Assessment/Data:    Flowsheet Rows    Flowsheet Row Most Recent Value  Intake Tab   Referral Department Hospitalist  Unit at Time of Referral ER  Palliative Care Primary Diagnosis Cardiac  Date Notified 12/02/21  Palliative Care Type New Palliative care  Reason for referral Clarify Goals of Care  Date of Admission 12/01/21  Date first seen by Palliative Care 12/02/21  # of days Palliative referral response time 0 Day(s)  # of days IP prior to Palliative referral 1  Clinical Assessment   Palliative Performance Scale Score 30%  Psychosocial & Spiritual Assessment   Palliative Care Outcomes        Patient Active Problem List   Diagnosis Date Noted   Acute CHF (congestive heart failure) ([facility]) 12/01/2021   Murmur  02/26/2020   Educated about COVID-19 virus infection 02/24/2020   Pain due to onychomycosis of toenails of both feet 01/21/2020   Coronary artery disease involving native coronary artery of native heart without angina pectoris 07/10/2019   Near syncope 01/20/2019   Prostate cancer ([place]) 01/20/2019   Hyperglycemia 01/20/2019   TIA (transient ischemic attack) 04/04/2017   Facial droop 04/04/2017   Localized swelling of lower extremity 04/04/2017   BPH (benign prostatic hyperplasia) 04/04/2017   Stroke-like symptoms 04/04/2017   Syncope and collapse 34/19/3790   Chronic systolic heart failure (HCC)    Cerebral infarction, remote, resolved    Mild dementia 08/10/2016   Sleep disturbance 01/06/2016   Ventricular ectopy 12/31/2014   Acute confusional state 12/31/2014   Palpitations    Nocturia 09/20/2013   Hyponatremia 09/29/2012   Fatigue 05/09/2012   PERSONAL HISTORY OTHER DISORDER URINARY SYSTEM 12/22/2010   Thrombocytopenia ([place]) 08/04/2010   LEUKOCYTOPENIA UNSPECIFIED 08/04/2010   TINNITUS, CHRONIC 07/06/2010   RENAL ARTERY STENOSIS 11/25/2009   RENAL INSUFFICIENCY 11/25/2009   DYSPNEA 09/24/2009   BRADYCARDIA 09/04/2009   HYPOTENSION-ORTHOSTATIC 09/04/2009   DIZZINESS 09/04/2009   HYPERLIPIDEMIA 01/31/2008   DEPRESSIVE DISORDER 01/23/2008   Essential hypertension 01/23/2008   ANXIETY 06/02/2007   Coronary atherosclerosis 06/02/2007   Insomnia 06/02/2007    Palliative Care Assessment & Plan   Patient Profile: 87 y.o. male  with past medical history of CAD status post CABG, HLD, HTN, chronic systolic heart failure admitted on 12/01/2021 with shortness of breath.  Noted to have dyspnea for the last couple of months "fluid on his lungs" and pneumonia.  Attempted diuresis at home and given antibiotics.  He had had return of his symptoms and was started on further diuretics.  He did not improve and caregiving needs escalated so his wife brought to the  ED.  Recommendations/Plan: DNR/DNI Wife now  report goal is to take him home and we discussed home hospice support.  She has been talking with a representative from an agency named [employer] but she is unsure which agency he works for.  I gave her the number to the case manager working with him at the hospital for her to give to [facility] to call to coordinate home hospice services.  Goals of Care and Additional Recommendations: Limitations on Scope of Treatment: Avoid Hospitalization  Code Status:    Code Status Orders  (From admission, onward)           Start     Ordered   12/01/21 1235  Do not attempt resuscitation (DNR)  Continuous       Question Answer Comment  In the event of cardiac or respiratory ARREST Do not call a code blue   In the event of cardiac or respiratory ARREST Do not perform Intubation, CPR, defibrillation or ACLS   In the event of cardiac or respiratory ARREST Use medication by any route, position, wound care, and other measures to relive pain and suffering. May use oxygen, suction and manual treatment of airway obstruction as needed for comfort.   Comments Confirmed w/ wife at bedside      12/01/21 1234           Code Status History     Date Active Date Inactive Code Status Order ID Comments User Context   01/20/2019 1213 01/23/2019 1435 DNR [id number]  Karmen Bongo, MD ED   04/04/2017 1520 04/05/2017 2122 Full Code [id number]  Waldemar Dickens, MD Inpatient   08/31/2016 1309 09/01/2016 1942 Full Code [id number]  Smiley Houseman, MD Inpatient   12/31/2014 0319 12/31/2014 1907 Full Code [id number]  Cletus Gash, MD Inpatient   12/23/2014 1623 12/25/2014 1514 Full Code [id number]  Theodis Blaze, MD Inpatient      Advance Directive Documentation    Flowsheet Row Most Recent Value  Type of Advance Directive Healthcare Power of Attorney, Living will  Pre-existing out of facility DNR order (yellow form or pink MOST form) --  "MOST" Form in Place? --        Prognosis:  < 6 months  Discharge Planning: Home with Hospice  Care plan was discussed with wife, case management  Thank you for allowing the Palliative Medicine Team to assist in the care of this patient.  Start time:1250 End time: 1345 Total time: 55 minutes     Greater than 50%  of this time was spent counseling and coordinating care related to the above assessment and plan.  Micheline Rough, MD  Please contact Palliative Medicine Team phone at [phone] for questions and concerns.

## 2021-12-03 NOTE — Evaluation (Signed)
Occupational Therapy Evaluation Patient Details Name: Victor Waters MRN: [id number] DOB: Jul 15, 1926 Today's Date: 12/03/2021   History of Present Illness 87 y.o. male with past medical history of CAD s/p CABG, hyperlipidemia, hypertension, chronic systolic heart failure, TIA, seizure and presented to the hospital with shortness of breath and dyspnea ongoing for the last 2 months.  Pt admitted 12/01/21 for Acute on chronic HFrEF.   Clinical Impression   Chart reviewed, pt greeted in bedside chair with wife present. Pt is asleep, requires significant multi modal cueing to orient to OT evaluation. When awake, pt is oriented to person, place, grossly to situation, grossly to time ("it is 2022 almost before Christmas"). Pt wife reports increased assistance required for all ADL/IADL in the past few months in which she is not always able to physically assist. On this date, pt required MAX A for STS using RW, step by step vcs, tactile cues. Pt with fair trunk control noted seated in bedside chair as evidenced by pt significant L lateral lean at start of evaluation. Pt is performing below PLOF, pt wife reports discussion with palliative care/consideration of hospice care. Education provided re: if discharging home recommended equipment (wheelchair, hospital bed).  At this time, recommend discharge to STR to address functional deficits pending decision on hospice. If pt does discharge home, will require assistance with ADLs. Pt is left as received, NAD, all needs met. OT will continue to follow while admitted.      Recommendations for follow up therapy are one component of a multi-disciplinary discharge planning process, led by the attending physician.  Recommendations may be updated based on patient status, additional functional criteria and insurance authorization.   Follow Up Recommendations  Skilled nursing-short term rehab (<3 hours/day)    Assistance Recommended at Discharge Frequent or constant  Supervision/Assistance  Functional Status Assessment  Patient has had a recent decline in their functional status and demonstrates the ability to make significant improvements in function in a reasonable and predictable amount of time.  Equipment Recommendations  Tub/shower bench;Wheelchair (measurements OT) (pt wife reports they have a wheelchair available to use)    Recommendations for Other Services       Precautions / Restrictions Precautions Precautions: Fall Restrictions Weight Bearing Restrictions: No      Mobility Bed Mobility Overal bed mobility: Needs Assistance Bed Mobility: Supine to Sit     Supine to sit: Mod assist;HOB elevated     General bed mobility comments: pt recieved in chair    Transfers Overall transfer level: Needs assistance Equipment used: Rolling walker (2 wheels) Transfers: Sit to/from Stand Sit to Stand: Max assist Stand pivot transfers: Mod assist         General transfer comment: step by step vcs for technique      Balance Overall balance assessment: Needs assistance Sitting-balance support: Feet supported;Bilateral upper extremity supported Sitting balance-Leahy Scale: Fair     Standing balance support: Bilateral upper extremity supported;Reliant on assistive device for balance;During functional activity Standing balance-Leahy Scale: Zero                             ADL either performed or assessed with clinical judgement   ADL Overall ADL's : Needs assistance/impaired Eating/Feeding: Minimal assistance Eating/Feeding Details (indicate cue type and reason): cutting food, loading food on fork/spoon Grooming: Wash/dry face;Wash/dry hands;Sitting;Supervision/safety;Cueing for sequencing Grooming Details (indicate cue type and reason): seated in bedside chair  Lower Body Dressing: Maximal assistance;Sitting/lateral leans Lower Body Dressing Details (indicate cue type and reason): socks      Toileting- Clothing Manipulation and Hygiene: Maximal assistance       Functional mobility during ADLs: Maximal assistance;Rolling walker (2 wheels) General ADL Comments: MAX A STS from bedside chair with step by step vcs for activation of poterior chain, body mechanics     Vision Patient Visual Report: No change from baseline              Pertinent Vitals/Pain Pain Assessment: No/denies pain     Hand Dominance     Extremity/Trunk Assessment Upper Extremity Assessment Upper Extremity Assessment: Generalized weakness   Lower Extremity Assessment Lower Extremity Assessment: Generalized weakness       Communication Communication Communication: HOH   Cognition Arousal/Alertness: Awake/alert Behavior During Therapy: WFL for tasks assessed/performed Overall Cognitive Status: Impaired/Different from baseline Area of Impairment: Memory;Attention;Following commands;Problem solving                   Current Attention Level: Focused Memory: Decreased short-term memory Following Commands: Follows one step commands with increased time     Problem Solving: Slow processing;Decreased initiation;Difficulty sequencing;Requires verbal cues;Requires tactile cues General Comments: pt wife reports poor STM, however current level of cognition is below typical level of functioning     General Comments  HR to 96 with activity    Exercises Other Exercises Other Exercises: education to pt and wife re: role of OT/role of further rehab, safe ADL completion, safe mobility techniques, cognition   Shoulder Instructions      Home Living Family/patient expects to be discharged to:: Private residence Living Arrangements: Spouse/significant other Available Help at Discharge: Family Type of Home: House Home Access: Stairs to enter Technical brewer of Steps: 5 Entrance Stairs-Rails: Right;Left Home Layout: One level               Home Equipment: Rollator (4  wheels);Rolling Walker (2 wheels)          Prior Functioning/Environment Prior Level of Function : Needs assist       Physical Assist : ADLs (physical)   ADLs (physical): Bathing;Dressing;IADLs Mobility Comments: wife reports use of RW for home distances ADLs Comments: Wife reports prior to illness in 9/22 pt was MOD I in ADL- now requires assist with ADL/IADL; She reports difficulty performing assistance with bathing/dressing        OT Problem List: Decreased strength;Decreased activity tolerance;Decreased cognition;Decreased knowledge of use of DME or AE;Impaired balance (sitting and/or standing);Decreased safety awareness      OT Treatment/Interventions: Self-care/ADL training;Cognitive remediation/compensation;Therapeutic exercise;Balance training;Therapeutic activities;Patient/family education    OT Goals(Current goals can be found in the care plan section) Acute Rehab OT Goals Patient Stated Goal: to stand up OT Goal Formulation: With patient Time For Goal Achievement: 12/17/21 Potential to Achieve Goals: Fair ADL Goals Pt Will Perform Grooming: sitting;with supervision Pt Will Perform Upper Body Dressing: sitting;with supervision Pt Will Perform Lower Body Dressing: sitting/lateral leans;with supervision Pt Will Transfer to Toilet: with min guard assist Pt Will Perform Toileting - Clothing Manipulation and hygiene: with min assist  OT Frequency: Min 2X/week   Barriers to D/C:            Co-evaluation              AM-PAC OT "6 Clicks" Daily Activity     Outcome Measure Help from another person eating meals?: A Little Help from another person taking care of personal grooming?: A  Little Help from another person toileting, which includes using toliet, bedpan, or urinal?: A Lot Help from another person bathing (including washing, rinsing, drying)?: A Lot Help from another person to put on and taking off regular upper body clothing?: A Lot Help from another  person to put on and taking off regular lower body clothing?: A Lot 6 Click Score: 14   End of Session Equipment Utilized During Treatment: Gait belt;Rolling walker (2 wheels) Nurse Communication: Mobility status  Activity Tolerance: Patient tolerated treatment well Patient left: in chair;with call bell/phone within reach;with chair alarm set  OT Visit Diagnosis: Unsteadiness on feet (R26.81)                Time: 1210-1230 OT Time Calculation (min): 20 min Charges:  OT General Charges $OT Visit: 1 Visit OT Evaluation $OT Eval Low Complexity: 1 Low Shanon Payor, OTD OTR/L  12/03/21, 1:06 PM

## 2021-12-03 NOTE — Consult Note (Signed)
Consultation Note Date: 12/03/2021   Patient Name: Victor Waters  DOB: 10-28-1926  MRN: [id number]  Age / Sex: 87 y.o., male  PCP: Sueanne Margarita, DO Referring Physician: Flora Lipps, MD  Reason for Consultation: Establishing goals of care  HPI/Patient Profile: 87 y.o. male  with past medical history of CAD status post CABG, HLD, HTN, chronic systolic heart failure admitted on 12/01/2021 with shortness of breath.  Noted to have dyspnea for the last couple of months "fluid on his lungs" and pneumonia.  Attempted diuresis at home and given antibiotics.  He had had return of his symptoms and was started on further diuretics.  He did not improve and caregiving needs escalated so his wife brought to the ED.  In reviewing the chart, there is mention of home hospice services, but I am not sure if he is active with any hospice organization.  Clinical Assessment and Goals of Care: Palliative care consult received.  Chart reviewed including personal review of pertinent labs and imaging.  I saw and examined [name] this evening.  He was in bed in no distress.  No family present at time of my encounter but noted that his wife was here earlier today.  At time that I saw him, he was sitting up in bed with meal tray from which he had eaten part of a [place] dinner.  He reports he was feeling overly full and did not want anymore.  I attempted to talk with him about his situation and he knows that he is at the hospital but cannot give me any further details.  He has no insight into why he is in the hospital and cannot give me any history of how things have been going at home.  I attempted to call his wife to discuss further but she did not answer and no option to leave voicemail.   SUMMARY OF RECOMMENDATIONS   -DNR/DNI -Continue current care.  It is noted in notes from Dr. Rosezella [place] office that he is on hospice  services.  I am not sure if this is accurate for which of agency he is on service with.  I was unable to reach any family today to confirm. -Also noted his wife is unable to care for him due to worsening debility and he may need placement. -We will plan to reach out again to family tomorrow to determine if he is on hospice services and also to discuss options moving forward for his care.  Code Status/Advance Care Planning: DNR  Symptom Management:  Currently denies any symptoms.  Palliative Prophylaxis:  Delirium Protocol  Psycho-social/Spiritual:  Desire for further Chaplaincy support: Did not address today Additional Recommendations: Caregiving  Support/Resources  Prognosis:  < 6 months  Discharge Planning: To Be Determined      Primary Diagnoses: Present on Admission: **None**   I have reviewed the medical record, interviewed the patient and family, and examined the patient. The following aspects are pertinent.  Past Medical History:  Diagnosis Date   Anxiety  Coronary artery disease    a. s/p CABG;  b. LHC (2/11):  LM 30-40, pLAD 95-99 then 80 and 90, mCFX 90, pRCA occluded, S-dRCA ok, S-D1 ok, S-OM patent with 50, L-LAD ok.  Med Rx recommended.  c.  Myoview (5/13):  Low risk with small fixed inf defect c/w scar, no ischemia, EF 48%.  d.  Echo (6/13):  EF 55-60%, inf HK, Gr 1 DD, MAC;  e. Lexiscan Myoview (10/14):  Low risk, EF 38%, inferobasal infarct, no ischemia.   Hyperlipidemia    Hypertension    Hyponatremia 09/2012   Associated with postural hypotension and confusion   Insomnia    Myocardial infarction [facility]) 2003   hx of    Renal artery stenosis (HCC)    bilateral;  s/p R RA stent 2011   Seizure ([place]) 2015   unknow etiology and year of seizure???   Syncope and collapse 58/52/7782   Systolic heart failure (HCC)    CHRONIC   TIA (transient ischemic attack)    Social History   Socioeconomic History   Marital status: Married    Spouse name: Not on file    Number of children: Not on file   Years of education: Not on file   Highest education level: Not on file  Occupational History   Occupation: retired  Tobacco Use   Smoking status: Never   Smokeless tobacco: Never  Substance and Sexual Activity   Alcohol use: No   Drug use: No   Sexual activity: Not Currently  Other Topics Concern   Not on file  Social History Narrative   Married   Lives with wife and 27year old ggd   Social Determinants of Health   Financial Resource Strain: Not on file  Food Insecurity: Not on file  Transportation Needs: Not on file  Physical Activity: Not on file  Stress: Not on file  Social Connections: Not on file   Family History  Problem Relation Age of Onset   Diabetes Mother    Stroke Mother    Heart attack Father 5       died   Colon cancer Other        paternal grandfather   Scheduled Meds:  aspirin EC  81 mg Oral QHS   enoxaparin (LOVENOX) injection  40 mg Subcutaneous Q24H   furosemide  40 mg Intravenous Q12H   potassium chloride  20 mEq Oral Daily   sacubitril-valsartan  1 tablet Oral BID   Continuous Infusions: PRN Meds:.acetaminophen **OR** acetaminophen, albuterol, ondansetron (ZOFRAN) IV Medications Prior to Admission:  Prior to Admission medications   Medication Sig Start Date End Date Taking? Authorizing Provider  Ascorbic Acid (VITAMIN C PO) Take 1 tablet by mouth daily.   Yes [provider]  Ascorbic Acid (VITAMIN C/NATURAL ROSE HIPS PO) Take 1 tablet by mouth daily.   Yes [provider]  aspirin 81 MG tablet Take 81 mg by mouth at bedtime.   Yes [provider]  B Complex Vitamins (B-COMPLEX/B-12) LIQD Take 1 tbsp by mouth daily   Yes [provider]  CALCIUM-MAGNESIUM-ZINC PO Take 1 tablet by mouth daily.   Yes [provider]  calcium-vitamin D (OSCAL WITH D) 500-200 MG-UNIT TABS tablet Take by mouth.   Yes [provider]  Cholecalciferol (VITAMIN D3) 1.25 MG  (50000 UT) TABS Take 2,000 Units by mouth once a week. Saturday/Sunday   Yes [provider]  ENTRESTO 49-51 MG TAKE 1 TABLET BY MOUTH TWICE DAILY Patient  taking differently: 1 tablet 2 (two) times daily. 08/28/21  Yes Minus Breeding, MD  furosemide (LASIX) 20 MG tablet Take 20 mg by mouth daily. 11/27/21  Yes [provider]  levofloxacin (LEVAQUIN) 500 MG tablet Take 500 mg by mouth daily. 11/25/21  Yes [provider]  Melatonin 3 MG TBDP Take 1 tablet by mouth at bedtime as needed (sleep). Reported on 04/29/2016   Yes [provider]  Omega-3 Fatty Acids (FISH OIL) 1200 MG CPDR Take 1 capsule by mouth daily.   Yes [provider]  Potassium 99 MG TABS Take 1 tablet by mouth daily as needed (for cramps).   Yes [provider]  albuterol (VENTOLIN HFA) 108 (90 Base) MCG/ACT inhaler Inhale 2 puffs into the lungs every 4 (four) hours as needed. Patient not taking: Reported on 12/01/2021 11/18/21   [provider]  Blood Pressure Monitoring (BLOOD PRESSURE CUFF) MISC 1 each by Does not apply route daily as needed. 09/09/21   Minus Breeding, MD  nitroGLYCERIN (NITROSTAT) 0.4 MG SL tablet DISSOLVE 1 TABLET UNDER THE TONGUE AS NEEDED FOR CHEST PAIN. MAY REPEAT IN 5 MINUTES AS DIRECTED. ER IF NO BETTER. Patient not taking: Reported on 12/01/2021 08/15/19   Minus Breeding, MD   Allergies  Allergen Reactions   Lisinopril Cough    Renal artery stenosis by history; ACE inhibitor  would be relatively contraindicated   Zithromax [Azithromycin] Nausea And Vomiting    02/15/14   Niacin Other (See Comments)    Burning sensations in head   Simvastatin Other (See Comments)    Unknown reaction per [facility]   Terazosin Other (See Comments)    Caused hypotension per [facility]   Review of Systems Poor historian.  Denies all complaints other than feeling full.  Physical Exam General: Alert, awake, in no acute distress.  No insight into his condition HEENT:  No bruits, no goiter, no JVD Heart: Regular rate and rhythm. No murmur appreciated. Lungs: Decreased with scattered crackles Abdomen: Soft, nontender, nondistended, positive bowel sounds.   Ext: No significant edema Skin: Warm and dry   Vital Signs: BP (!) 114/49    Pulse (!) 102    Temp 98.2 F (36.8 C) (Oral)    Resp 19    Ht _0  (1.626 m)    Wt 64.1 kg    SpO2 96%    BMI 24.26 kg/m  Pain Scale: 0-10   Pain Score: 0-No pain   SpO2: SpO2: 96 % O2 Device:SpO2: 96 % O2 Flow Rate: .   IO: Intake/output summary:  Intake/Output Summary (Last 24 hours) at 12/03/2021 1002 Last data filed at 12/03/2021 0600 Gross per 24 hour  Intake 300 ml  Output 600 ml  Net -300 ml    LBM: Last BM Date:  (pt cannot recall) Baseline Weight: Weight: 65.3 kg Most recent weight: Weight: 64.1 kg     Palliative Assessment/Data:   Flowsheet Rows    Flowsheet Row Most Recent Value  Intake Tab   Referral Department Hospitalist  Unit at Time of Referral ER  Palliative Care Primary Diagnosis Cardiac  Date Notified 12/02/21  Palliative Care Type New Palliative care  Reason for referral Clarify Goals of Care  Date of Admission 12/01/21  Date first seen by Palliative Care 12/02/21  # of days Palliative referral response time 0 Day(s)  # of days IP prior to Palliative referral 1  Clinical Assessment   Palliative Performance Scale Score 30%  Psychosocial & Spiritual Assessment  Palliative Care Outcomes        Time In: 1800 Time Out: 1845 Time Total: 45 Greater than 50%  of this time was spent counseling and coordinating care related to the above assessment and plan.  Signed by: Micheline Rough, MD   Please contact Palliative Medicine Team phone at [phone] for questions and concerns.  For individual provider: See Shea Evans

## 2021-12-03 NOTE — Evaluation (Signed)
Physical Therapy Evaluation Patient Details Name: Victor Waters MRN: [id number] DOB: 1926-08-23 Today's Date: 12/03/2021  History of Present Illness  88 y.o. male with past medical history of CAD s/p CABG, hyperlipidemia, hypertension, chronic systolic heart failure, TIA, seizure and presented to the hospital with shortness of breath and dyspnea ongoing for the last 2 months.  Pt admitted 12/01/21 for Acute on chronic HFrEF.  Clinical Impression  Pt admitted with above diagnosis. Pt currently with functional limitations due to the deficits listed below (see PT Problem List). Pt will benefit from skilled PT to increase their independence and safety with mobility to allow discharge to the venue listed below.  Pt assisted with bed mobility and transfer to recliner.  Pt requiring at least mod assist due to weakness at this time.  Spouse would not be able to provide this level of assist at home.  Pt typically able to ambulate short distances within home with walker.  Pt may need SNF upon d/c.  Palliative care plan also pending.      Recommendations for follow up therapy are one component of a multi-disciplinary discharge planning process, led by the attending physician.  Recommendations may be updated based on patient status, additional functional criteria and insurance authorization.  Follow Up Recommendations Skilled nursing-short term rehab (<3 hours/day)    Assistance Recommended at Discharge Frequent or constant Supervision/Assistance  Functional Status Assessment Patient has had a recent decline in their functional status and demonstrates the ability to make significant improvements in function in a reasonable and predictable amount of time.  Equipment Recommendations  None recommended by PT    Recommendations for Other Services       Precautions / Restrictions Precautions Precautions: Fall      Mobility  Bed Mobility Overal bed mobility: Needs Assistance Bed Mobility: Supine to  Sit     Supine to sit: Mod assist;HOB elevated     General bed mobility comments: pt initiating movement, required assist to complete transition to EOB    Transfers Overall transfer level: Needs assistance Equipment used: Rolling walker (2 wheels) Transfers: Sit to/from Stand;Bed to chair/wheelchair/BSC Sit to Stand: Mod assist;From elevated surface Stand pivot transfers: Mod assist         General transfer comment: multimodal cues for technique, pt with difficulty moving feet, required assist at hips to guide into recliner, assist due to weakness    Ambulation/Gait                  Stairs            Wheelchair Mobility    Modified Rankin (Stroke Patients Only)       Balance Overall balance assessment: Needs assistance Sitting-balance support: No upper extremity supported;Feet supported Sitting balance-Leahy Scale: Fair     Standing balance support: Bilateral upper extremity supported;Reliant on assistive device for balance;During functional activity Standing balance-Leahy Scale: Zero                               Pertinent Vitals/Pain Pain Assessment: No/denies pain    Home Living Family/patient expects to be discharged to:: Private residence Living Arrangements: Spouse/significant other Available Help at Discharge: Family Type of Home: House Home Access: Stairs to enter Entrance Stairs-Rails: Psychiatric nurse of Steps: 5   Home Layout: One level Home Equipment: Rollator (4 wheels);Rolling Walker (2 wheels)      Prior Function Prior Level of Function : Independent/Modified Independent  Mobility Comments: pt typically able to ambulate short distances in home with walker       Hand Dominance        Extremity/Trunk Assessment        Lower Extremity Assessment Lower Extremity Assessment: Generalized weakness       Communication   Communication: HOH  Cognition Arousal/Alertness:  Awake/alert Behavior During Therapy: WFL for tasks assessed/performed   Area of Impairment: Following commands;Problem solving                       Following Commands: Follows one step commands with increased time     Problem Solving: Slow processing;Difficulty sequencing;Requires verbal cues          General Comments      Exercises     Assessment/Plan    PT Assessment Patient needs continued PT services  PT Problem List Decreased mobility;Decreased balance;Decreased strength;Decreased knowledge of use of DME;Decreased safety awareness       PT Treatment Interventions Gait training;DME instruction;Therapeutic exercise;Balance training;Functional mobility training;Therapeutic activities;Patient/family education    PT Goals (Current goals can be found in the Care Plan section)  Acute Rehab PT Goals PT Goal Formulation: With patient/family Time For Goal Achievement: 12/17/21 Potential to Achieve Goals: Good    Frequency Min 2X/week   Barriers to discharge        Co-evaluation               AM-PAC PT "6 Clicks" Mobility  Outcome Measure Help needed turning from your back to your side while in a flat bed without using bedrails?: A Lot Help needed moving from lying on your back to sitting on the side of a flat bed without using bedrails?: A Lot Help needed moving to and from a bed to a chair (including a wheelchair)?: A Lot Help needed standing up from a chair using your arms (e.g., wheelchair or bedside chair)?: A Lot Help needed to walk in hospital room?: Total Help needed climbing 3-5 steps with a railing? : Total 6 Click Score: 10    End of Session Equipment Utilized During Treatment: Gait belt Activity Tolerance: Patient tolerated treatment well Patient left: in chair;with call bell/phone within reach;with chair alarm set;with family/visitor present Nurse Communication: Mobility status PT Visit Diagnosis: Difficulty in walking, not elsewhere  classified (R26.2);Muscle weakness (generalized) (M62.81)    Time: 6606-3016 PT Time Calculation (min) (ACUTE ONLY): 16 min   Charges:   PT Evaluation $PT Eval Moderate Complexity: 1 Mod        Kati PT, DPT Acute Rehabilitation Services Pager: [phone] Office: [phone]   Myrtis Hopping [name] 12/03/2021, 12:17 PM

## 2021-12-04 ENCOUNTER — Inpatient Hospital Stay (HOSPITAL_COMMUNITY): Payer: Medicare HMO

## 2021-12-04 DIAGNOSIS — I5023 Acute on chronic systolic (congestive) heart failure: Secondary | ICD-10-CM

## 2021-12-04 LAB — TSH: TSH: 2.952 u[IU]/mL (ref 0.350–4.500)

## 2021-12-04 LAB — ECHOCARDIOGRAM COMPLETE
AR max vel: 1.1 cm2
AV Area VTI: 0.86 cm2
AV Area mean vel: 0.99 cm2
AV Mean grad: 8 mmHg
AV Peak grad: 11.8 mmHg
Ao pk vel: 1.72 m/s
Area-P 1/2: 5.16 cm2
Calc EF: 22 %
Height: 64 in
MV M vel: 4.73 m/s
MV Peak grad: 89.5 mmHg
P 1/2 time: 394 msec
S' Lateral: 4.7 cm
Single Plane A2C EF: 22.1 %
Single Plane A4C EF: 20.6 %
Weight: 2261.04 oz

## 2021-12-04 LAB — BASIC METABOLIC PANEL
Anion gap: 14 (ref 5–15)
BUN: 30 mg/dL — ABNORMAL HIGH (ref 8–23)
CO2: 25 mmol/L (ref 22–32)
Calcium: 8.8 mg/dL — ABNORMAL LOW (ref 8.9–10.3)
Chloride: 86 mmol/L — ABNORMAL LOW (ref 98–111)
Creatinine, Ser: 0.98 mg/dL (ref 0.61–1.24)
GFR, Estimated: >60 mL/min (ref >60)
Glucose, Bld: 98 mg/dL (ref 70–99)
Potassium: 3.7 mmol/L (ref 3.5–5.1)
Sodium: 125 mmol/L — ABNORMAL LOW (ref 135–145)

## 2021-12-04 LAB — CBC
HCT: 35.8 % — ABNORMAL LOW (ref 39.0–52.0)
Hemoglobin: 12.7 g/dL — ABNORMAL LOW (ref 13.0–17.0)
MCH: 30.1 pg (ref 26.0–34.0)
MCHC: 35.5 g/dL (ref 30.0–36.0)
MCV: 84.8 fL (ref 80.0–100.0)
Platelets: 139 10*3/uL — ABNORMAL LOW (ref 150–400)
RBC: 4.22 MIL/uL (ref 4.22–5.81)
RDW: 14.2 % (ref 11.5–15.5)
WBC: 5.1 10*3/uL (ref 4.0–10.5)
nRBC: 0 % (ref 0.0–0.2)

## 2021-12-04 LAB — PHOSPHORUS: Phosphorus: 3.7 mg/dL (ref 2.5–4.6)

## 2021-12-04 LAB — MAGNESIUM: Magnesium: 1.9 mg/dL (ref 1.7–2.4)

## 2021-12-04 MED ORDER — PERFLUTREN LIPID MICROSPHERE
1.0000 mL | INTRAVENOUS | Status: AC | PRN
Start: 2021-12-04 — End: 2021-12-04
  Administered 2021-12-04: 2 mL via INTRAVENOUS

## 2021-12-04 MED ORDER — FUROSEMIDE 10 MG/ML IJ SOLN
40.0000 mg | Freq: Two times a day (BID) | INTRAMUSCULAR | Status: DC
  Administered 2021-12-05 (×2): 40 mg via INTRAVENOUS
  Filled 2021-12-04 (×2): qty 4

## 2021-12-04 NOTE — Plan of Care (Signed)
  Problem: Education: Goal: Knowledge of General Education information will improve Description: Including pain rating scale, medication(s)/side effects and non-pharmacologic comfort measures Outcome: Progressing   Problem: Clinical Measurements: Goal: Respiratory complications will improve Outcome: Progressing   Problem: Nutrition: Goal: Adequate nutrition will be maintained Outcome: Progressing   

## 2021-12-04 NOTE — Progress Notes (Addendum)
Pt had 6 beats of Vtach Non sustained asymptomatic This occurred with activity and surprise returned visit from wife. MD Pokhrel notified.

## 2021-12-04 NOTE — NC FL2 (Signed)
[place] LEVEL OF CARE SCREENING TOOL     IDENTIFICATION  Patient Name: Victor Waters Birthdate: [birth year] Sex: male Admission Date (Current Location): 12/01/2021  [facility] and [place] Number:  Herbalist and Address:  [facility],  [place] [address] [place]      Provider Number:    Attending Physician Name and Address:  Flora Lipps, MD  Relative Name and Phone Number:       Current Level of Care: Hospital Recommended Level of Care: [place] Prior Approval Number:    Date Approved/Denied:   PASRR Number: [id number]  Discharge Plan: SNF    Current Diagnoses: Patient Active Problem List   Diagnosis Date Noted   Acute CHF (congestive heart failure) ([place]) 12/01/2021   Murmur 02/26/2020   Educated about COVID-19 virus infection 02/24/2020   Pain due to onychomycosis of toenails of both feet 01/21/2020   Coronary artery disease involving native coronary artery of native heart without angina pectoris 07/10/2019   Near syncope 01/20/2019   Prostate cancer ([place]) 01/20/2019   Hyperglycemia 01/20/2019   TIA (transient ischemic attack) 04/04/2017   Facial droop 04/04/2017   Localized swelling of lower extremity 04/04/2017   BPH (benign prostatic hyperplasia) 04/04/2017   Stroke-like symptoms 04/04/2017   Syncope and collapse 47/08/6282   Chronic systolic heart failure (HCC)    Cerebral infarction, remote, resolved    Mild dementia 08/10/2016   Sleep disturbance 01/06/2016   Ventricular ectopy 12/31/2014   Acute confusional state 12/31/2014   Palpitations    Nocturia 09/20/2013   Hyponatremia 09/29/2012   Fatigue 05/09/2012   PERSONAL HISTORY OTHER DISORDER URINARY SYSTEM 12/22/2010   Thrombocytopenia ([place]) 08/04/2010   LEUKOCYTOPENIA UNSPECIFIED 08/04/2010   TINNITUS, CHRONIC 07/06/2010   RENAL ARTERY STENOSIS 11/25/2009   RENAL INSUFFICIENCY 11/25/2009   DYSPNEA 09/24/2009   BRADYCARDIA  09/04/2009   HYPOTENSION-ORTHOSTATIC 09/04/2009   DIZZINESS 09/04/2009   HYPERLIPIDEMIA 01/31/2008   DEPRESSIVE DISORDER 01/23/2008   Essential hypertension 01/23/2008   ANXIETY 06/02/2007   Coronary atherosclerosis 06/02/2007   Insomnia 06/02/2007    Orientation RESPIRATION BLADDER Height & Weight     Self, Time, Situation, Place  Normal Continent Weight: 64.1 kg Height:  5\' 4"  (162.6 cm)  BEHAVIORAL SYMPTOMS/MOOD NEUROLOGICAL BOWEL NUTRITION STATUS      Continent Diet (regular)  AMBULATORY STATUS COMMUNICATION OF NEEDS Skin   Extensive Assist Verbally Normal                       Personal Care Assistance Level of Assistance  Bathing, Feeding, Dressing Bathing Assistance: Limited assistance Feeding assistance: Limited assistance Dressing Assistance: Limited assistance     Functional Limitations Info  Sight, Hearing, Speech Sight Info: Adequate Hearing Info: Adequate Speech Info: Adequate    SPECIAL CARE FACTORS FREQUENCY                       Contractures Contractures Info: Not present    Additional Factors Info  Code Status Code Status Info: DNR             Current Medications (12/04/2021):  This is the current hospital active medication list Current Facility-Administered Medications  Medication Dose Route Frequency Provider Last Rate Last Admin   acetaminophen (TYLENOL) tablet 650 mg  650 mg Oral Q6H PRN Marylyn Ishihara, Tyrone A, DO   650 mg at 12/03/21 0547   Or   acetaminophen (TYLENOL) suppository 650 mg  650  mg Rectal Q6H PRN Marylyn Ishihara, Tyrone A, DO       albuterol (PROVENTIL) (2.5 MG/3ML) 0.083% nebulizer solution 2.5 mg  2.5 mg Nebulization Q6H PRN Marylyn Ishihara, Tyrone A, DO   2.5 mg at 12/04/21 0321   aspirin EC tablet 81 mg  81 mg Oral QHS Pokhrel, Laxman, MD   81 mg at 12/03/21 2333   enoxaparin (LOVENOX) injection 40 mg  40 mg Subcutaneous Q24H Pokhrel, Laxman, MD   40 mg at 12/03/21 2333   furosemide (LASIX) injection 40 mg  40 mg Intravenous Q12H Kyle,  Tyrone A, DO   40 mg at 12/04/21 0939   ondansetron (ZOFRAN) injection 4 mg  4 mg Intravenous Q6H PRN Pokhrel, Laxman, MD       potassium chloride SA (KLOR-CON M) CR tablet 20 mEq  20 mEq Oral Daily Pokhrel, Laxman, MD   20 mEq at 12/04/21 2248   sacubitril-valsartan (ENTRESTO) 49-51 mg per tablet  1 tablet Oral BID Flora Lipps, MD   1 tablet at 12/04/21 2500     Discharge Medications: Please see discharge summary for a list of discharge medications.  Relevant Imaging Results:  Relevant Lab Results:   Additional Information ssn:[id number]  Leeroy Cha, RN

## 2021-12-04 NOTE — Plan of Care (Signed)
°  Problem: Education: Goal: Knowledge of General Education information will improve Description: Including pain rating scale, medication(s)/side effects and non-pharmacologic comfort measures Outcome: Progressing   Problem: Clinical Measurements: Goal: Will remain free from infection Outcome: Progressing Goal: Diagnostic test results will improve Outcome: Progressing   Problem: Nutrition: Goal: Adequate nutrition will be maintained Outcome: Progressing

## 2021-12-04 NOTE — Progress Notes (Signed)
PROGRESS NOTE  [name] FAO:[id number] DOB: [birth year] DOA: 12/01/2021 PCP: Sueanne Margarita, DO   LOS: 3 days   Brief narrative:  Victor Waters is a 87 y.o. male with past medical history of coronary artery disease status post CABG, hyperlipidemia, hypertension, chronic systolic heart failure presented to the hospital with shortness of breath and dyspnea ongoing for the last 2 months.  He was last seen by his primary care physician who told that he had fluid in his lungs and pneumonia and was given some antibiotic and fluid pills.Patient however had increasing shortness of breath for 1 day so he was brought into the hospital.  In the ED, patient was noted to have BNP of 1400.  Sodium was low.  CT chest showed bilateral pleural effusion and atelectasis.  Patient was started on Lasix and hospitalist team was consulted for further evaluation and treatment.   Assessment/Plan:  Principal Problem:   Acute CHF (congestive heart failure) (HCC)  Acute on chronic HFrEF Continue CHF protocol.  Strict intake and output charting.  Daily weights.   Pending 2D echocardiogram.   COVID and influenza was negative.  Patient takes oral Lasix at home.  Continue IV Lasix and Entresto.  Negative balance of 1390 ml, 3 pound weight loss documented  Mild hypokalemia.  Improved after replacement.  Potassium was 3.5 today.  Hyponatremia Thought to be secondary to hypervolemic hyponatremia.  Continue fluid restriction, IV Lasix.  Continue to monitor closely.  Urinary sodium was 92.  TSH within normal limits.  Sodium of 125 today from 122  B/l pleural effusions CT scan of the chest showing bilateral pleural effusion with atelectasis.  Continue with IV diuresis for now. Continue incentive spirometry.  Procalcitonin was less than 0.10.  No leukocytosis or fever.  No indication for antibiotic at this time.  Normocytic anemia Latest hemoglobin of 12.7, will continue to monitor.   Generalized weakness/Debility,  deconditioning. Likely secondary to congestive heart failure exacerbation.  No evidence of infection.  UA was negative.  Procalcitonin less than 0.10.   Patient wife stating that she is unable to take care of him at home by herself until he is able to ambulate some.  Now she feels like that she wishes to take him home if he is able to do more by himself.  CAD s/p CABG Continue aspirin  Disposition  Likely hospice services.  Palliative care on board.  DVT prophylaxis: enoxaparin (LOVENOX) injection 40 mg Start: 12/02/21 2200  Code Status: DNR.   Family Communication:  I again spoke with the patient's wife at bedside on 12/04/21.  Status is: Inpatient  Remains inpatient appropriate because: Congestive heart failure, IV diuresis, hyponatremia, disposition possibly to hospice but patient does not have good support at home.  Consultants: None  Procedures: None  Anti-infectives:  None  Anti-infectives (From admission, onward)    None      Subjective: Today, patient was seen and examined at bedside.  States that he feels a little better with breathing with less shortness of breath.  Patient's wife at bedside.  She wishes her husband to be able to walk some before going home.    Objective: Vitals:   12/03/21 2300 12/04/21 0530  BP: 103/76 118/81  Pulse: (!) 103 82  Resp:    Temp:  98.1 F (36.7 C)  SpO2:  96%    Intake/Output Summary (Last 24 hours) at 12/04/2021 0804 Last data filed at 12/04/2021 0500 Gross per 24 hour  Intake 860 ml  Output 1950 ml  Net -1090 ml    Filed Weights   12/02/21 0350 12/03/21 0349  Weight: 65.3 kg 64.1 kg   Body mass index is 24.26 kg/m.   Physical Exam:  General:  Average built, not in obvious distress, hard of hearing HENT:   No scleral pallor or icterus noted. Oral mucosa is moist.  Chest:.  Diminished breath sounds bilaterally.  Occasional rales noted. CVS: S1 &S2 heard. No murmur.  Regular rate and rhythm. Abdomen:  Soft, nontender, nondistended.  Bowel sounds are heard.   Extremities: No cyanosis, clubbing trace lower extremity edema-improved, peripheral pulses are palpable. Psych: Alert, awake and communicative, normal mood CNS:  No cranial nerve deficits.  Power equal in all extremities.   Skin: Warm and dry.  No rashes noted.  Data Review: I have personally reviewed the following laboratory data and studies,  CBC: Recent Labs  Lab 12/01/21 0722 12/01/21 2231 12/02/21 0500 12/03/21 0439 12/04/21 0445  WBC 3.9* 4.7 5.6 6.8 5.1  NEUTROABS 2.8  --   --   --   --   HGB 12.4* 12.8* 13.3 13.1 12.7*  HCT 35.5* 36.6* 38.4* 38.2* 35.8*  MCV 87.4 86.1 86.9 88.4 84.8  PLT 145* 150 154 148* 139*    Basic Metabolic Panel: Recent Labs  Lab 12/01/21 0722 12/01/21 2231 12/02/21 0830 12/03/21 0439 12/04/21 0445  NA 123*  --  125* 122* 125*  K 3.6  --  3.3* 3.8 3.7  CL 88*  --  87* 85* 86*  CO2 25  --  27 24 25   GLUCOSE 117*  --  114* 108* 98  BUN 18  --  17 23 30*  CREATININE 0.99 0.94 0.95 1.00 0.98  CALCIUM 8.7*  --  8.8* 9.0 8.8*  MG  --   --   --  1.9 1.9  PHOS  --   --  3.5 3.2 3.7    Liver Function Tests: Recent Labs  Lab 12/01/21 0722 12/02/21 0830  AST 25  --   ALT 18  --   ALKPHOS 47  --   BILITOT 0.7  --   PROT 5.8*  --   ALBUMIN 3.5 3.6    No results for input(s): LIPASE, AMYLASE in the last 168 hours. Recent Labs  Lab 12/01/21 0722  AMMONIA <10    Cardiac Enzymes: No results for input(s): CKTOTAL, CKMB, CKMBINDEX, TROPONINI in the last 168 hours. BNP (last 3 results) Recent Labs    12/01/21 0722 12/03/21 1245  BNP 1,433.5* 2,022.0*     ProBNP (last 3 results) No results for input(s): PROBNP in the last 8760 hours.  CBG: No results for input(s): GLUCAP in the last 168 hours. Recent Results (from the past 240 hour(s))  Resp Panel by RT-PCR (Flu A&B, Covid) Nasopharyngeal Swab     Status: None   Collection Time: 12/01/21  9:37 AM   Specimen:  Nasopharyngeal Swab; Nasopharyngeal(NP) swabs in vial transport medium  Result Value Ref Range Status   SARS Coronavirus 2 by RT PCR NEGATIVE NEGATIVE Final    Comment: (NOTE) SARS-CoV-2 target nucleic acids are NOT DETECTED.  The SARS-CoV-2 RNA is generally detectable in upper respiratory specimens during the acute phase of infection. The lowest concentration of SARS-CoV-2 viral copies this assay can detect is 138 copies/mL. A negative result does not preclude SARS-Cov-2 infection and should not be used as the sole basis for treatment or other patient management decisions. A negative result may occur with  improper  specimen collection/handling, submission of specimen other than nasopharyngeal swab, presence of viral mutation(s) within the areas targeted by this assay, and inadequate number of viral copies(<138 copies/mL). A negative result must be combined with clinical observations, patient history, and epidemiological information. The expected result is Negative.  Fact Sheet for Patients:  EntrepreneurPulse.com.au  Fact Sheet for Healthcare Providers:  IncredibleEmployment.be  This test is no t yet approved or cleared by the [place] FDA and  has been authorized for detection and/or diagnosis of SARS-CoV-2 by FDA under an Emergency Use Authorization (EUA). This EUA will remain  in effect (meaning this test can be used) for the duration of the COVID-19 declaration under Section 564(b)(1) of the Act, 21 U.S.C.section 360bbb-3(b)(1), unless the authorization is terminated  or revoked sooner.       Influenza A by PCR NEGATIVE NEGATIVE Final   Influenza B by PCR NEGATIVE NEGATIVE Final    Comment: (NOTE) The Xpert Xpress SARS-CoV-2/FLU/RSV plus assay is intended as an aid in the diagnosis of influenza from Nasopharyngeal swab specimens and should not be used as a sole basis for treatment. Nasal washings and aspirates are unacceptable for  Xpert Xpress SARS-CoV-2/FLU/RSV testing.  Fact Sheet for Patients: EntrepreneurPulse.com.au  Fact Sheet for Healthcare Providers: IncredibleEmployment.be  This test is not yet approved or cleared by the [place] FDA and has been authorized for detection and/or diagnosis of SARS-CoV-2 by FDA under an Emergency Use Authorization (EUA). This EUA will remain in effect (meaning this test can be used) for the duration of the COVID-19 declaration under Section 564(b)(1) of the Act, 21 U.S.C. section 360bbb-3(b)(1), unless the authorization is terminated or revoked.  Performed at [facility], [place] [address], [place], [place] [address]       Studies: No results found.    Flora Lipps, MD  [facility] 12/04/2021  If 7PM-7AM, please contact night-coverage

## 2021-12-04 NOTE — Care Management Important Message (Signed)
Important Message  Patient Details IM Letter placed in Patients room. Name: Victor Waters MRN: [id number] Date of Birth: 05/10/1926   Medicare Important Message Given:  Yes     Kerin Salen 12/04/2021, 12:27 PM

## 2021-12-04 NOTE — TOC Progression Note (Addendum)
Transition of Care [facility]) - Progression Note    Patient Details  Name: Victor Waters MRN: [id number] Date of Birth: 04/22/1926  Transition of Care [facility]) CM/SW Contact  Leeroy Cha, RN Phone Number: 12/04/2021, 3:04 PM  Clinical Narrative:    Spoke with wife now wants patient to go to [facility].  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 Fl2 sent to [facility].  Blaine Hamper started. Tct-[facility]-lynn-will have to have elig. Checked may be Saturday before can be confirmed.      Expected Discharge Plan and Services                                                 Social Determinants of Health (SDOH) Interventions    Readmission Risk Interventions No flowsheet data found.

## 2021-12-04 NOTE — Progress Notes (Signed)
Pt had a 6beat run of Vtach, asymptomatic. Pt resting comfortably in bed.   Pt also had a 5beat run of Vtach around 2330 last night, also asymptomatic, VSS

## 2021-12-05 LAB — BASIC METABOLIC PANEL
Anion gap: 10 (ref 5–15)
BUN: 36 mg/dL — ABNORMAL HIGH (ref 8–23)
CO2: 27 mmol/L (ref 22–32)
Calcium: 8.7 mg/dL — ABNORMAL LOW (ref 8.9–10.3)
Chloride: 87 mmol/L — ABNORMAL LOW (ref 98–111)
Creatinine, Ser: 1.12 mg/dL (ref 0.61–1.24)
GFR, Estimated: >60 mL/min (ref >60)
Glucose, Bld: 92 mg/dL (ref 70–99)
Potassium: 3.8 mmol/L (ref 3.5–5.1)
Sodium: 124 mmol/L — ABNORMAL LOW (ref 135–145)

## 2021-12-05 LAB — MAGNESIUM: Magnesium: 2 mg/dL (ref 1.7–2.4)

## 2021-12-05 LAB — CBC
HCT: 34.4 % — ABNORMAL LOW (ref 39.0–52.0)
Hemoglobin: 12 g/dL — ABNORMAL LOW (ref 13.0–17.0)
MCH: 29.9 pg (ref 26.0–34.0)
MCHC: 34.9 g/dL (ref 30.0–36.0)
MCV: 85.6 fL (ref 80.0–100.0)
Platelets: 139 10*3/uL — ABNORMAL LOW (ref 150–400)
RBC: 4.02 MIL/uL — ABNORMAL LOW (ref 4.22–5.81)
RDW: 14.2 % (ref 11.5–15.5)
WBC: 4.3 10*3/uL (ref 4.0–10.5)
nRBC: 0 % (ref 0.0–0.2)

## 2021-12-05 NOTE — TOC Progression Note (Signed)
Transition of Care [facility]) - Progression Note    Patient Details  Name: Victor Waters MRN: [id number] Date of Birth: 03-Aug-1926  Transition of Care [facility]) CM/SW Contact  Purcell Mouton, RN Phone Number: 12/05/2021, 11:38 AM  Clinical Narrative:    [facility] in house rep called to check on pt's discharge. Would like to know if pt is being discharged today.         Expected Discharge Plan and Services                                                 Social Determinants of Health (SDOH) Interventions    Readmission Risk Interventions No flowsheet data found.

## 2021-12-05 NOTE — Plan of Care (Signed)
  Problem: Education: Goal: Knowledge of General Education information will improve Description: Including pain rating scale, medication(s)/side effects and non-pharmacologic comfort measures Outcome: Progressing   Problem: Safety: Goal: Ability to remain free from injury will improve Outcome: Progressing   

## 2021-12-05 NOTE — TOC Progression Note (Addendum)
Transition of Care [facility]) - Progression Note    Patient Details  Name: Victor Waters MRN: [id number] Date of Birth: Oct 18, 1926  Transition of Care [facility]) CM/SW Contact  Purcell Mouton, RN Phone Number: 12/05/2021, 11:46 AM  Clinical Narrative:    Spoke with pt's wife who states that she is waiting on Hospice to see if they can provide more help. If not then SNF. So at present time Hospice cannot provide 24/7 care at pt's home.         Expected Discharge Plan and Services                                                 Social Determinants of Health (SDOH) Interventions    Readmission Risk Interventions No flowsheet data found.

## 2021-12-05 NOTE — Progress Notes (Signed)
PROGRESS NOTE  Victor Waters GHW:[id number] DOB: 1926/06/27 DOA: 12/01/2021 PCP: Sueanne Margarita, DO   LOS: 4 days   Brief narrative:  Victor Waters is a 87 y.o. male with past medical history of coronary artery disease status post CABG, hyperlipidemia, hypertension, chronic systolic heart failure presented to the hospital with shortness of breath and dyspnea ongoing for the last 2 months.  He was last seen by his primary care physician who told that he had fluid in his lungs and pneumonia and was given some antibiotic and fluid pills.Patient however had increasing shortness of breath for 1 day so he was brought into the hospital.  In the ED, patient was noted to have BNP of 1400.  Sodium was low.  CT chest showed bilateral pleural effusion and atelectasis.  Patient was started on Lasix and hospitalist team was consulted for further evaluation and treatment.   Assessment/Plan:  Principal Problem:   Acute CHF (congestive heart failure) (HCC)  Acute on chronic HFrEF Continue CHF protocol.  Strict intake and output charting.  Daily weights. 2D echocardiogram from 12/04/2021 shows LV ejection fraction of 20 to 25% with global hypokinesis.  2D echocardiogram from 2020 shows LV ejection fraction of 45 to 50%.  Worsening systolic function.  COVID and influenza was negative.  Patient takes oral Lasix at home.  Continue IV Lasix and Entresto.  Negative balance of 2360 ml, 2 kg weight loss documented.  Spoke with the patient's wife at bedside.  Overall poor prognosis with advanced heart failure hyponatremia.  Family interested in hospice.  Mild hypokalemia.  Improved after replacement.  Potassium was 3.8 today.  We will continue to monitor while on diuretics.  Hyponatremia Thought to be secondary to hypervolemic hyponatremia.  Continue fluid restriction, IV Lasix.  Continue to monitor closely.  Urinary sodium was 92.  TSH within normal limits.  Sodium of 124 from 125< 122.  Poor prognosis in the  context of heart failure  B/l pleural effusions Secondary to congestive heart failure.  CT scan of the chest showing bilateral pleural effusion with atelectasis.  Continue with IV diuresis for now. Continue incentive spirometry.   Normocytic anemia Latest hemoglobin of 12.0, will continue to monitor.   Generalized weakness/Debility, deconditioning. Likely secondary to congestive heart failure exacerbation.  No evidence of infection.  UA was negative.  Procalcitonin less than 0.10.   Patient's wife wishes to take him home if he is able to ambulate more.  TOC involved for skilled nursing facility.  CAD s/p CABG Continue aspirin  Disposition  Likely home with hospice services.versus skilled nursing facility.  Palliative care on board.  DVT prophylaxis: enoxaparin (LOVENOX) injection 40 mg Start: 12/02/21 2200  Code Status: DNR.   Family Communication:  I again spoke with the patient's wife at bedside today.  Status is: Inpatient  Remains inpatient appropriate because: Congestive heart failure, IV diuresis, hyponatremia, disposition possibly to hospice at home versus SNF  Consultants: None  Procedures: None  Anti-infectives:  None  Anti-infectives (From admission, onward)    None      Subjective: Today, patient was seen and examined at bedside.  Patient denies any chest pain, dizziness lightheadedness feels little better with breathing.  Has not had any chest pain  Objective: Vitals:   12/05/21 0204 12/05/21 0527  BP: (!) 119/50 (!) 119/44  Pulse:  (!) 49  Resp:  18  Temp:  (!) 97.5 F (36.4 C)  SpO2:  93%    Intake/Output Summary (Last 24 hours) at  12/05/2021 0839 Last data filed at 12/05/2021 5364 Gross per 24 hour  Intake 780 ml  Output 1750 ml  Net -970 ml    Filed Weights   12/02/21 0350 12/03/21 0349 12/05/21 0527  Weight: 65.3 kg 64.1 kg 63.9 kg   Body mass index is 24.19 kg/m.   Physical Exam: General:  Average built, not in obvious  distress, mildly hard of hearing HENT:   No scleral pallor or icterus noted. Oral mucosa is moist.  Chest:.  Diminished breath sounds noted. CVS: S1 &S2 heard. No murmur.  Regular rate and rhythm. Abdomen: Soft, nontender, nondistended.  Bowel sounds are heard.   Extremities: No cyanosis, clubbing, trace peripheral edema which has improved Psych: Alert, awake and communicative, normal mood CNS:  No cranial nerve deficits.  Power equal in all extremities.   Skin: Warm and dry.  No rashes noted.  Data Review: I have personally reviewed the following laboratory data and studies,  CBC: Recent Labs  Lab 12/01/21 0722 12/01/21 2231 12/02/21 0500 12/03/21 0439 12/04/21 0445 12/05/21 0541  WBC 3.9* 4.7 5.6 6.8 5.1 4.3  NEUTROABS 2.8  --   --   --   --   --   HGB 12.4* 12.8* 13.3 13.1 12.7* 12.0*  HCT 35.5* 36.6* 38.4* 38.2* 35.8* 34.4*  MCV 87.4 86.1 86.9 88.4 84.8 85.6  PLT 145* 150 154 148* 139* 139*    Basic Metabolic Panel: Recent Labs  Lab 12/01/21 0722 12/01/21 2231 12/02/21 0830 12/03/21 0439 12/04/21 0445 12/05/21 0541  NA 123*  --  125* 122* 125* 124*  K 3.6  --  3.3* 3.8 3.7 3.8  CL 88*  --  87* 85* 86* 87*  CO2 25  --  27 24 25 27   GLUCOSE 117*  --  114* 108* 98 92  BUN 18  --  17 23 30* 36*  CREATININE 0.99 0.94 0.95 1.00 0.98 1.12  CALCIUM 8.7*  --  8.8* 9.0 8.8* 8.7*  MG  --   --   --  1.9 1.9 2.0  PHOS  --   --  3.5 3.2 3.7  --     Liver Function Tests: Recent Labs  Lab 12/01/21 0722 12/02/21 0830  AST 25  --   ALT 18  --   ALKPHOS 47  --   BILITOT 0.7  --   PROT 5.8*  --   ALBUMIN 3.5 3.6    No results for input(s): LIPASE, AMYLASE in the last 168 hours. Recent Labs  Lab 12/01/21 0722  AMMONIA <10    Cardiac Enzymes: No results for input(s): CKTOTAL, CKMB, CKMBINDEX, TROPONINI in the last 168 hours. BNP (last 3 results) Recent Labs    12/01/21 0722 12/03/21 1245  BNP 1,433.5* 2,022.0*     ProBNP (last 3 results) No results for  input(s): PROBNP in the last 8760 hours.  CBG: No results for input(s): GLUCAP in the last 168 hours. Recent Results (from the past 240 hour(s))  Resp Panel by RT-PCR (Flu A&B, Covid) Nasopharyngeal Swab     Status: None   Collection Time: 12/01/21  9:37 AM   Specimen: Nasopharyngeal Swab; Nasopharyngeal(NP) swabs in vial transport medium  Result Value Ref Range Status   SARS Coronavirus 2 by RT PCR NEGATIVE NEGATIVE Final    Comment: (NOTE) SARS-CoV-2 target nucleic acids are NOT DETECTED.  The SARS-CoV-2 RNA is generally detectable in upper respiratory specimens during the acute phase of infection. The lowest concentration of SARS-CoV-2 viral copies  this assay can detect is 138 copies/mL. A negative result does not preclude SARS-Cov-2 infection and should not be used as the sole basis for treatment or other patient management decisions. A negative result may occur with  improper specimen collection/handling, submission of specimen other than nasopharyngeal swab, presence of viral mutation(s) within the areas targeted by this assay, and inadequate number of viral copies(<138 copies/mL). A negative result must be combined with clinical observations, patient history, and epidemiological information. The expected result is Negative.  Fact Sheet for Patients:  EntrepreneurPulse.com.au  Fact Sheet for Healthcare Providers:  IncredibleEmployment.be  This test is no t yet approved or cleared by the [place] FDA and  has been authorized for detection and/or diagnosis of SARS-CoV-2 by FDA under an Emergency Use Authorization (EUA). This EUA will remain  in effect (meaning this test can be used) for the duration of the COVID-19 declaration under Section 564(b)(1) of the Act, 21 U.S.C.section 360bbb-3(b)(1), unless the authorization is terminated  or revoked sooner.       Influenza A by PCR NEGATIVE NEGATIVE Final   Influenza B by PCR NEGATIVE  NEGATIVE Final    Comment: (NOTE) The Xpert Xpress SARS-CoV-2/FLU/RSV plus assay is intended as an aid in the diagnosis of influenza from Nasopharyngeal swab specimens and should not be used as a sole basis for treatment. Nasal washings and aspirates are unacceptable for Xpert Xpress SARS-CoV-2/FLU/RSV testing.  Fact Sheet for Patients: EntrepreneurPulse.com.au  Fact Sheet for Healthcare Providers: IncredibleEmployment.be  This test is not yet approved or cleared by the [place] FDA and has been authorized for detection and/or diagnosis of SARS-CoV-2 by FDA under an Emergency Use Authorization (EUA). This EUA will remain in effect (meaning this test can be used) for the duration of the COVID-19 declaration under Section 564(b)(1) of the Act, 21 U.S.C. section 360bbb-3(b)(1), unless the authorization is terminated or revoked.  Performed at [facility], [address], [place], [place] [address]       Studies: ECHOCARDIOGRAM COMPLETE  Result Date: 12/04/2021    ECHOCARDIOGRAM REPORT   Patient Name:   Victor Waters Date of Exam: 12/04/2021 Medical Rec #:  [id number]       Height:       64.0 in Accession #:    [id number]      Weight:       141.3 lb Date of Birth:  1926-08-04       BSA:          1.688 m Patient Age:    25 years        BP:           118/81 mmHg Patient Gender: M               HR:           69 bpm. Exam Location:  Inpatient Procedure: 2D Echo, Cardiac Doppler, Color Doppler and Intracardiac            Opacification Agent Indications:    CHF  History:        Patient has prior history of Echocardiogram examinations. CAD;                 Risk Factors:Hypertension.  Sonographer:    Jyl Heinz Referring Phys: [id number] [place]  1. Left ventricular ejection fraction, by estimation, is 20 to 25%. The left ventricle has severely decreased function. The left ventricle demonstrates global hypokinesis.  2.  Right ventricular systolic function  is normal. The right ventricular size is normal.  3. Left atrial size was mildly dilated.  4. Mild mitral valve regurgitation.  5. The aortic valve is calcified. Aortic valve regurgitation is not visualized.  6. Not well visualized. Conclusion(s)/Recommendation(s): Compared to prior echo 01/20/2019 , EF more reduced. FINDINGS  Left Ventricle: Left ventricular ejection fraction, by estimation, is 20 to 25%. The left ventricle has severely decreased function. The left ventricle demonstrates global hypokinesis. The left ventricular internal cavity size was normal in size. There is no left ventricular hypertrophy. Right Ventricle: The right ventricular size is normal. No increase in right ventricular wall thickness. Right ventricular systolic function is normal. Left Atrium: Left atrial size was mildly dilated. Right Atrium: Right atrial size was not assessed. Mitral Valve: There is moderate calcification of the mitral valve leaflet(s). Mild mitral valve regurgitation. Tricuspid Valve: The tricuspid valve is grossly normal. Tricuspid valve regurgitation is not demonstrated. Aortic Valve: The aortic valve is calcified. Aortic valve regurgitation is not visualized. Aortic regurgitation PHT measures 394 msec. Aortic valve mean gradient measures 8.0 mmHg. Aortic valve peak gradient measures 11.8 mmHg. Aortic valve area, by VTI measures 0.86 cm. Pulmonic Valve: Pulmonic valve regurgitation is trivial. Aorta: The aortic root and ascending aorta are structurally normal, with no evidence of dilitation. Venous: Not well visualized.  LEFT VENTRICLE PLAX 2D LVIDd:         5.40 cm      Diastology LVIDs:         4.70 cm      LV e' medial:    5.68 cm/s LV PW:         1.20 cm      LV E/e' medial:  12.0 LV IVS:        1.20 cm      LV e' lateral:   8.59 cm/s LVOT diam:     2.10 cm      LV E/e' lateral: 7.9 LV SV:         28 LV SV Index:   17 LVOT Area:     3.46 cm  LV Volumes (MOD) LV vol d, MOD A2C:  181.0 ml LV vol d, MOD A4C: 111.0 ml LV vol s, MOD A2C: 141.0 ml LV vol s, MOD A4C: 88.1 ml LV SV MOD A2C:     40.0 ml LV SV MOD A4C:     111.0 ml LV SV MOD BP:      31.7 ml RIGHT VENTRICLE            IVC RV Basal diam:  3.60 cm    IVC diam: 1.80 cm RV Mid diam:    3.10 cm RV S prime:     5.51 cm/s TAPSE (M-mode): 1.3 cm LEFT ATRIUM             Index        RIGHT ATRIUM           Index LA diam:        3.70 cm 2.19 cm/m   RA Area:     15.20 cm LA Vol (A2C):   93.8 ml 55.57 ml/m  RA Volume:   41.60 ml  24.65 ml/m LA Vol (A4C):   72.1 ml 42.72 ml/m LA Biplane Vol: 86.1 ml 51.01 ml/m  AORTIC VALVE AV Area (Vmax):    1.10 cm AV Area (Vmean):   0.99 cm AV Area (VTI):     0.86 cm AV Vmax:  172.00 cm/s AV Vmean:          135.000 cm/s AV VTI:            0.326 m AV Peak Grad:      11.8 mmHg AV Mean Grad:      8.0 mmHg LVOT Vmax:         54.80 cm/s LVOT Vmean:        38.500 cm/s LVOT VTI:          0.081 m LVOT/AV VTI ratio: 0.25 AI PHT:            394 msec  AORTA Ao Root diam: 3.20 cm Ao Asc diam:  3.00 cm MITRAL VALVE MV Area (PHT): 5.16 cm    SHUNTS MV Decel Time: 147 msec    Systemic VTI:  0.08 m MR Peak grad: 89.5 mmHg    Systemic Diam: 2.10 cm MR Mean grad: 62.5 mmHg MR Vmax:      473.00 cm/s MR Vmean:     383.0 cm/s MV E velocity: 68.10 cm/s MV A velocity: 36.80 cm/s MV E/A ratio:  1.85 Phineas Inches Electronically signed by Phineas Inches Signature Date/Time: 12/04/2021/6:28:22 PM    Final       Flora Lipps, MD  [facility] 12/05/2021  If 7PM-7AM, please contact night-coverage

## 2021-12-06 MED ORDER — FUROSEMIDE 10 MG/ML IJ SOLN
40.0000 mg | Freq: Every day | INTRAMUSCULAR | Status: DC
  Filled 2021-12-06: qty 4

## 2021-12-06 NOTE — TOC Progression Note (Signed)
Transition of Care [facility]) - Progression Note    Patient Details  Name: Victor Waters MRN: [id number] Date of Birth: 08/30/1926  [facility] CM/SW Contact  Cecil Cobbs Phone Number: 12/06/2021, 10:23 AM  Clinical Narrative:     Clinical updates sent to SNFs awaiting bed offers.        Expected Discharge Plan and Services                                                 Social Determinants of Health (SDOH) Interventions    Readmission Risk Interventions No flowsheet data found.

## 2021-12-06 NOTE — Plan of Care (Signed)
°  Problem: Nutrition: °Goal: Adequate nutrition will be maintained °Outcome: Progressing °  °Problem: Coping: °Goal: Level of anxiety will decrease °Outcome: Progressing °  °Problem: Safety: °Goal: Ability to remain free from injury will improve °Outcome: Progressing °  °

## 2021-12-06 NOTE — Progress Notes (Signed)
PROGRESS NOTE  Victor Waters CBJ:[id number] DOB: December 11, 1926 DOA: 12/01/2021 PCP: Sueanne Margarita, DO   LOS: 5 days   Brief narrative:  Victor Waters is a 87 y.o. male with past medical history of coronary artery disease status post CABG, hyperlipidemia, hypertension, chronic systolic heart failure presented to the hospital with shortness of breath and dyspnea ongoing for the last 2 months.  He was last seen by his primary care physician who told that he had fluid in his lungs and pneumonia and was given some antibiotic and fluid pills.Patient however had increasing shortness of breath for 1 day so he was brought into the hospital.  In the ED, patient was noted to have BNP of 1400.  Sodium was low.  CT chest showed bilateral pleural effusion and atelectasis.  Patient was started on Lasix and hospitalist team was consulted for further evaluation and treatment.   Assessment/Plan:  Principal Problem:   Acute CHF (congestive heart failure) (HCC)  Acute on chronic HFrEF Continue CHF protocol.  Strict intake and output charting.  Daily weights. 2D echocardiogram from 12/04/2021 shows LV ejection fraction of 20 to 25% with global hypokinesis.  2D echocardiogram from 2022 shows LV ejection fraction of 45 to 50%.  Worsening systolic function.  COVID and influenza was negative.  Patient takes oral Lasix at home.  Continue IV Lasix and Entresto.  Negative balance of 3490 ml, 2 kg weight loss documented.  Spoke with the patient's wife at bedside.  Overall poor prognosis with advanced heart failure, hyponatremia.  Wife interested in hospice but wishes to have him come home but he is debilitated and weak and she is unable to help him so plan is likely skilled nursing facility at this time with hospice transition as outpatient  Mild hypokalemia.  Improved after replacement.  Latest potassium of 3.8.    Hyponatremia Thought to be secondary to hypervolemic hyponatremia.  Continue fluid restriction, IV Lasix.   Lasix dose has been decreased.  Continue to monitor closely.  Urinary sodium was 92.  TSH within normal limits.  Sodium of 124 from 125< 122.  Poor prognosis in the context of heart failure  B/l pleural effusions Secondary to congestive heart failure.  CT scan of the chest showing bilateral pleural effusion with atelectasis.  Continue with IV diuresis for now. Continue incentive spirometry.  Patient is more comfortable lying down at this time  Normocytic anemia Latest hemoglobin of 12.0, will continue to monitor.   Generalized weakness/Debility, deconditioning. Likely secondary to congestive heart failure exacerbation.  No evidence of infection.  UA was negative.  Procalcitonin less than 0.10.  TOC on board for disposition plan.   CAD s/p CABG Continue aspirin  Disposition  Likely home with hospice services.versus skilled nursing facility.  Palliative care on board.  Patient's wife is interested in [facility]  DVT prophylaxis: enoxaparin (LOVENOX) injection 40 mg Start: 12/02/21 2200  Code Status: DNR.   Family Communication:  I again spoke with the patient's wife at bedside today.  Status is: Inpatient  Remains inpatient appropriate because: Congestive heart failure, IV diuresis, hyponatremia, disposition possibly to hospice at home versus SNF  Consultants: None  Procedures: None  Anti-infectives:  None  Anti-infectives (From admission, onward)    None      Subjective: Today, patient was seen and examined at bedside.  Feels a little better with breathing.  Lying more flat.  Denies any pain, nausea, vomiting.  Patient's wife at bedside  Objective: Vitals:   12/05/21  2242 12/06/21 0515  BP: 104/73 (!) 103/57  Pulse: 90 89  Resp: 18 18  Temp:  97.9 F (36.6 C)  SpO2: 97% 97%    Intake/Output Summary (Last 24 hours) at 12/06/2021 0931 Last data filed at 12/06/2021 0525 Gross per 24 hour  Intake 720 ml  Output 1850 ml  Net -1130 ml    Filed  Weights   12/03/21 0349 12/05/21 0527 12/06/21 0525  Weight: 64.1 kg 63.9 kg 63.5 kg   Body mass index is 24.03 kg/m.   Physical Exam: General:  Average built, not in obvious distress, mildly  hard of hearing HENT:   No scleral pallor or icterus noted. Oral mucosa is moist.  Chest:   Diminished breath sounds bilaterally.  CVS: S1 &S2 heard. No murmur.  Regular rate and rhythm. Abdomen: Soft, nontender, nondistended.  Bowel sounds are heard.   Extremities: No cyanosis, clubbing with trace peripheral edema, peripheral pulses are palpable. Psych: Alert, awake and oriented, normal mood CNS:  No cranial nerve deficits.  Power equal in all extremities.   Skin: Warm and dry.  No rashes noted.   Data Review: I have personally reviewed the following laboratory data and studies,  CBC: Recent Labs  Lab 12/01/21 0722 12/01/21 2231 12/02/21 0500 12/03/21 0439 12/04/21 0445 12/05/21 0541  WBC 3.9* 4.7 5.6 6.8 5.1 4.3  NEUTROABS 2.8  --   --   --   --   --   HGB 12.4* 12.8* 13.3 13.1 12.7* 12.0*  HCT 35.5* 36.6* 38.4* 38.2* 35.8* 34.4*  MCV 87.4 86.1 86.9 88.4 84.8 85.6  PLT 145* 150 154 148* 139* 139*    Basic Metabolic Panel: Recent Labs  Lab 12/01/21 0722 12/01/21 2231 12/02/21 0830 12/03/21 0439 12/04/21 0445 12/05/21 0541  NA 123*  --  125* 122* 125* 124*  K 3.6  --  3.3* 3.8 3.7 3.8  CL 88*  --  87* 85* 86* 87*  CO2 25  --  27 24 25 27   GLUCOSE 117*  --  114* 108* 98 92  BUN 18  --  17 23 30* 36*  CREATININE 0.99 0.94 0.95 1.00 0.98 1.12  CALCIUM 8.7*  --  8.8* 9.0 8.8* 8.7*  MG  --   --   --  1.9 1.9 2.0  PHOS  --   --  3.5 3.2 3.7  --     Liver Function Tests: Recent Labs  Lab 12/01/21 0722 12/02/21 0830  AST 25  --   ALT 18  --   ALKPHOS 47  --   BILITOT 0.7  --   PROT 5.8*  --   ALBUMIN 3.5 3.6    No results for input(s): LIPASE, AMYLASE in the last 168 hours. Recent Labs  Lab 12/01/21 0722  AMMONIA <10    Cardiac Enzymes: No results for  input(s): CKTOTAL, CKMB, CKMBINDEX, TROPONINI in the last 168 hours. BNP (last 3 results) Recent Labs    12/01/21 0722 12/03/21 1245  BNP 1,433.5* 2,022.0*     ProBNP (last 3 results) No results for input(s): PROBNP in the last 8760 hours.  CBG: No results for input(s): GLUCAP in the last 168 hours. Recent Results (from the past 240 hour(s))  Resp Panel by RT-PCR (Flu A&B, Covid) Nasopharyngeal Swab     Status: None   Collection Time: 12/01/21  9:37 AM   Specimen: Nasopharyngeal Swab; Nasopharyngeal(NP) swabs in vial transport medium  Result Value Ref Range Status   SARS Coronavirus 2  by RT PCR NEGATIVE NEGATIVE Final    Comment: (NOTE) SARS-CoV-2 target nucleic acids are NOT DETECTED.  The SARS-CoV-2 RNA is generally detectable in upper respiratory specimens during the acute phase of infection. The lowest concentration of SARS-CoV-2 viral copies this assay can detect is 138 copies/mL. A negative result does not preclude SARS-Cov-2 infection and should not be used as the sole basis for treatment or other patient management decisions. A negative result may occur with  improper specimen collection/handling, submission of specimen other than nasopharyngeal swab, presence of viral mutation(s) within the areas targeted by this assay, and inadequate number of viral copies(<138 copies/mL). A negative result must be combined with clinical observations, patient history, and epidemiological information. The expected result is Negative.  Fact Sheet for Patients:  EntrepreneurPulse.com.au  Fact Sheet for Healthcare Providers:  IncredibleEmployment.be  This test is no t yet approved or cleared by the [place] FDA and  has been authorized for detection and/or diagnosis of SARS-CoV-2 by FDA under an Emergency Use Authorization (EUA). This EUA will remain  in effect (meaning this test can be used) for the duration of the COVID-19 declaration  under Section 564(b)(1) of the Act, 21 U.S.C.section 360bbb-3(b)(1), unless the authorization is terminated  or revoked sooner.       Influenza A by PCR NEGATIVE NEGATIVE Final   Influenza B by PCR NEGATIVE NEGATIVE Final    Comment: (NOTE) The Xpert Xpress SARS-CoV-2/FLU/RSV plus assay is intended as an aid in the diagnosis of influenza from Nasopharyngeal swab specimens and should not be used as a sole basis for treatment. Nasal washings and aspirates are unacceptable for Xpert Xpress SARS-CoV-2/FLU/RSV testing.  Fact Sheet for Patients: EntrepreneurPulse.com.au  Fact Sheet for Healthcare Providers: IncredibleEmployment.be  This test is not yet approved or cleared by the [place] FDA and has been authorized for detection and/or diagnosis of SARS-CoV-2 by FDA under an Emergency Use Authorization (EUA). This EUA will remain in effect (meaning this test can be used) for the duration of the COVID-19 declaration under Section 564(b)(1) of the Act, 21 U.S.C. section 360bbb-3(b)(1), unless the authorization is terminated or revoked.  Performed at [facility], [place] [address], [place], [place] [address]       Studies: ECHOCARDIOGRAM COMPLETE  Result Date: 12/04/2021    ECHOCARDIOGRAM REPORT   Patient Name:   IREN WHIPP Date of Exam: 12/04/2021 Medical Rec #:  [id number]       Height:       64.0 in Accession #:    [id number]      Weight:       141.3 lb Date of Birth:  10-Aug-1926       BSA:          1.688 m Patient Age:    34 years        BP:           118/81 mmHg Patient Gender: M               HR:           69 bpm. Exam Location:  Inpatient Procedure: 2D Echo, Cardiac Doppler, Color Doppler and Intracardiac            Opacification Agent Indications:    CHF  History:        Patient has prior history of Echocardiogram examinations. CAD;                 Risk Factors:Hypertension.  Sonographer:  Jyl Heinz Referring  Phys: [id number] [place]  1. Left ventricular ejection fraction, by estimation, is 20 to 25%. The left ventricle has severely decreased function. The left ventricle demonstrates global hypokinesis.  2. Right ventricular systolic function is normal. The right ventricular size is normal.  3. Left atrial size was mildly dilated.  4. Mild mitral valve regurgitation.  5. The aortic valve is calcified. Aortic valve regurgitation is not visualized.  6. Not well visualized. Conclusion(s)/Recommendation(s): Compared to prior echo 01/20/2019 , EF more reduced. FINDINGS  Left Ventricle: Left ventricular ejection fraction, by estimation, is 20 to 25%. The left ventricle has severely decreased function. The left ventricle demonstrates global hypokinesis. The left ventricular internal cavity size was normal in size. There is no left ventricular hypertrophy. Right Ventricle: The right ventricular size is normal. No increase in right ventricular wall thickness. Right ventricular systolic function is normal. Left Atrium: Left atrial size was mildly dilated. Right Atrium: Right atrial size was not assessed. Mitral Valve: There is moderate calcification of the mitral valve leaflet(s). Mild mitral valve regurgitation. Tricuspid Valve: The tricuspid valve is grossly normal. Tricuspid valve regurgitation is not demonstrated. Aortic Valve: The aortic valve is calcified. Aortic valve regurgitation is not visualized. Aortic regurgitation PHT measures 394 msec. Aortic valve mean gradient measures 8.0 mmHg. Aortic valve peak gradient measures 11.8 mmHg. Aortic valve area, by VTI measures 0.86 cm. Pulmonic Valve: Pulmonic valve regurgitation is trivial. Aorta: The aortic root and ascending aorta are structurally normal, with no evidence of dilitation. Venous: Not well visualized.  LEFT VENTRICLE PLAX 2D LVIDd:         5.40 cm      Diastology LVIDs:         4.70 cm      LV e' medial:    5.68 cm/s LV PW:         1.20 cm      LV  E/e' medial:  12.0 LV IVS:        1.20 cm      LV e' lateral:   8.59 cm/s LVOT diam:     2.10 cm      LV E/e' lateral: 7.9 LV SV:         28 LV SV Index:   17 LVOT Area:     3.46 cm  LV Volumes (MOD) LV vol d, MOD A2C: 181.0 ml LV vol d, MOD A4C: 111.0 ml LV vol s, MOD A2C: 141.0 ml LV vol s, MOD A4C: 88.1 ml LV SV MOD A2C:     40.0 ml LV SV MOD A4C:     111.0 ml LV SV MOD BP:      31.7 ml RIGHT VENTRICLE            IVC RV Basal diam:  3.60 cm    IVC diam: 1.80 cm RV Mid diam:    3.10 cm RV S prime:     5.51 cm/s TAPSE (M-mode): 1.3 cm LEFT ATRIUM             Index        RIGHT ATRIUM           Index LA diam:        3.70 cm 2.19 cm/m   RA Area:     15.20 cm LA Vol (A2C):   93.8 ml 55.57 ml/m  RA Volume:   41.60 ml  24.65 ml/m LA Vol (A4C):   72.1 ml 42.72 ml/m LA Biplane  Vol: 86.1 ml 51.01 ml/m  AORTIC VALVE AV Area (Vmax):    1.10 cm AV Area (Vmean):   0.99 cm AV Area (VTI):     0.86 cm AV Vmax:           172.00 cm/s AV Vmean:          135.000 cm/s AV VTI:            0.326 m AV Peak Grad:      11.8 mmHg AV Mean Grad:      8.0 mmHg LVOT Vmax:         54.80 cm/s LVOT Vmean:        38.500 cm/s LVOT VTI:          0.081 m LVOT/AV VTI ratio: 0.25 AI PHT:            394 msec  AORTA Ao Root diam: 3.20 cm Ao Asc diam:  3.00 cm MITRAL VALVE MV Area (PHT): 5.16 cm    SHUNTS MV Decel Time: 147 msec    Systemic VTI:  0.08 m MR Peak grad: 89.5 mmHg    Systemic Diam: 2.10 cm MR Mean grad: 62.5 mmHg MR Vmax:      473.00 cm/s MR Vmean:     383.0 cm/s MV E velocity: 68.10 cm/s MV A velocity: 36.80 cm/s MV E/A ratio:  1.85 Placido Sou signed by Phineas Inches Signature Date/Time: 12/04/2021/6:28:22 PM    Final       Flora Lipps, MD  [facility] 12/06/2021  If 7PM-7AM, please contact night-coverage

## 2021-12-07 DIAGNOSIS — Z515 Encounter for palliative care: Secondary | ICD-10-CM | POA: Diagnosis not present

## 2021-12-07 DIAGNOSIS — S0003XA Contusion of scalp, initial encounter: Secondary | ICD-10-CM | POA: Diagnosis not present

## 2021-12-07 DIAGNOSIS — I2510 Atherosclerotic heart disease of native coronary artery without angina pectoris: Secondary | ICD-10-CM | POA: Diagnosis not present

## 2021-12-07 DIAGNOSIS — E785 Hyperlipidemia, unspecified: Secondary | ICD-10-CM | POA: Diagnosis not present

## 2021-12-07 DIAGNOSIS — R531 Weakness: Secondary | ICD-10-CM | POA: Diagnosis not present

## 2021-12-07 DIAGNOSIS — I491 Atrial premature depolarization: Secondary | ICD-10-CM | POA: Diagnosis not present

## 2021-12-07 DIAGNOSIS — Z7409 Other reduced mobility: Secondary | ICD-10-CM | POA: Diagnosis not present

## 2021-12-07 DIAGNOSIS — J90 Pleural effusion, not elsewhere classified: Secondary | ICD-10-CM | POA: Diagnosis not present

## 2021-12-07 DIAGNOSIS — I5022 Chronic systolic (congestive) heart failure: Secondary | ICD-10-CM | POA: Diagnosis not present

## 2021-12-07 DIAGNOSIS — E876 Hypokalemia: Secondary | ICD-10-CM

## 2021-12-07 DIAGNOSIS — Z743 Need for continuous supervision: Secondary | ICD-10-CM | POA: Diagnosis not present

## 2021-12-07 DIAGNOSIS — R296 Repeated falls: Secondary | ICD-10-CM | POA: Diagnosis not present

## 2021-12-07 DIAGNOSIS — E871 Hypo-osmolality and hyponatremia: Secondary | ICD-10-CM | POA: Diagnosis not present

## 2021-12-07 DIAGNOSIS — I5084 End stage heart failure: Secondary | ICD-10-CM | POA: Diagnosis not present

## 2021-12-07 DIAGNOSIS — I10 Essential (primary) hypertension: Secondary | ICD-10-CM | POA: Diagnosis not present

## 2021-12-07 DIAGNOSIS — R5381 Other malaise: Secondary | ICD-10-CM | POA: Diagnosis not present

## 2021-12-07 DIAGNOSIS — F0390 Unspecified dementia without behavioral disturbance: Secondary | ICD-10-CM | POA: Diagnosis not present

## 2021-12-07 DIAGNOSIS — R4182 Altered mental status, unspecified: Secondary | ICD-10-CM | POA: Diagnosis not present

## 2021-12-07 DIAGNOSIS — W050XXA Fall from non-moving wheelchair, initial encounter: Secondary | ICD-10-CM | POA: Diagnosis not present

## 2021-12-07 DIAGNOSIS — S0990XA Unspecified injury of head, initial encounter: Secondary | ICD-10-CM | POA: Diagnosis not present

## 2021-12-07 DIAGNOSIS — D649 Anemia, unspecified: Secondary | ICD-10-CM | POA: Diagnosis not present

## 2021-12-07 DIAGNOSIS — S0081XA Abrasion of other part of head, initial encounter: Secondary | ICD-10-CM | POA: Diagnosis not present

## 2021-12-07 DIAGNOSIS — R0989 Other specified symptoms and signs involving the circulatory and respiratory systems: Secondary | ICD-10-CM | POA: Diagnosis not present

## 2021-12-07 DIAGNOSIS — I5021 Acute systolic (congestive) heart failure: Secondary | ICD-10-CM | POA: Diagnosis not present

## 2021-12-07 DIAGNOSIS — R0600 Dyspnea, unspecified: Secondary | ICD-10-CM | POA: Diagnosis not present

## 2021-12-07 DIAGNOSIS — R102 Pelvic and perineal pain: Secondary | ICD-10-CM | POA: Diagnosis not present

## 2021-12-07 DIAGNOSIS — S199XXA Unspecified injury of neck, initial encounter: Secondary | ICD-10-CM | POA: Diagnosis not present

## 2021-12-07 DIAGNOSIS — I110 Hypertensive heart disease with heart failure: Secondary | ICD-10-CM | POA: Diagnosis not present

## 2021-12-07 DIAGNOSIS — M25551 Pain in right hip: Secondary | ICD-10-CM | POA: Diagnosis not present

## 2021-12-07 DIAGNOSIS — R0902 Hypoxemia: Secondary | ICD-10-CM | POA: Diagnosis not present

## 2021-12-07 DIAGNOSIS — I959 Hypotension, unspecified: Secondary | ICD-10-CM | POA: Diagnosis not present

## 2021-12-07 DIAGNOSIS — R404 Transient alteration of awareness: Secondary | ICD-10-CM | POA: Diagnosis not present

## 2021-12-07 DIAGNOSIS — Z7401 Bed confinement status: Secondary | ICD-10-CM | POA: Diagnosis not present

## 2021-12-07 DIAGNOSIS — Z79899 Other long term (current) drug therapy: Secondary | ICD-10-CM | POA: Diagnosis not present

## 2021-12-07 DIAGNOSIS — J9811 Atelectasis: Secondary | ICD-10-CM | POA: Diagnosis not present

## 2021-12-07 DIAGNOSIS — Z951 Presence of aortocoronary bypass graft: Secondary | ICD-10-CM | POA: Diagnosis not present

## 2021-12-07 DIAGNOSIS — Z8546 Personal history of malignant neoplasm of prostate: Secondary | ICD-10-CM | POA: Diagnosis not present

## 2021-12-07 DIAGNOSIS — Z8616 Personal history of COVID-19: Secondary | ICD-10-CM | POA: Diagnosis not present

## 2021-12-07 DIAGNOSIS — Z7982 Long term (current) use of aspirin: Secondary | ICD-10-CM | POA: Diagnosis not present

## 2021-12-07 DIAGNOSIS — R0602 Shortness of breath: Secondary | ICD-10-CM | POA: Diagnosis not present

## 2021-12-07 DIAGNOSIS — Z7189 Other specified counseling: Secondary | ICD-10-CM | POA: Diagnosis not present

## 2021-12-07 DIAGNOSIS — Z955 Presence of coronary angioplasty implant and graft: Secondary | ICD-10-CM | POA: Diagnosis not present

## 2021-12-07 LAB — CBC
HCT: 33.6 % — ABNORMAL LOW (ref 39.0–52.0)
Hemoglobin: 11.9 g/dL — ABNORMAL LOW (ref 13.0–17.0)
MCH: 30.1 pg (ref 26.0–34.0)
MCHC: 35.4 g/dL (ref 30.0–36.0)
MCV: 84.8 fL (ref 80.0–100.0)
Platelets: 143 10*3/uL — ABNORMAL LOW (ref 150–400)
RBC: 3.96 MIL/uL — ABNORMAL LOW (ref 4.22–5.81)
RDW: 13.5 % (ref 11.5–15.5)
WBC: 3 10*3/uL — ABNORMAL LOW (ref 4.0–10.5)
nRBC: 0 % (ref 0.0–0.2)

## 2021-12-07 LAB — MAGNESIUM: Magnesium: 2.2 mg/dL (ref 1.7–2.4)

## 2021-12-07 LAB — BASIC METABOLIC PANEL
Anion gap: 9 (ref 5–15)
BUN: 37 mg/dL — ABNORMAL HIGH (ref 8–23)
CO2: 24 mmol/L (ref 22–32)
Calcium: 8.5 mg/dL — ABNORMAL LOW (ref 8.9–10.3)
Chloride: 87 mmol/L — ABNORMAL LOW (ref 98–111)
Creatinine, Ser: 0.79 mg/dL (ref 0.61–1.24)
GFR, Estimated: >60 mL/min (ref >60)
Glucose, Bld: 103 mg/dL — ABNORMAL HIGH (ref 70–99)
Potassium: 4.2 mmol/L (ref 3.5–5.1)
Sodium: 120 mmol/L — ABNORMAL LOW (ref 135–145)

## 2021-12-07 MED ORDER — OYSTER SHELL CALCIUM/D3 500-5 MG-MCG PO TABS
1.0000 | ORAL_TABLET | Freq: Every day | ORAL | Status: DC
  Administered 2021-12-07: 10:00:00 1 via ORAL
  Filled 2021-12-07: qty 1

## 2021-12-07 MED ORDER — FUROSEMIDE 20 MG PO TABS
20.0000 mg | ORAL_TABLET | Freq: Every day | ORAL | Status: DC
  Administered 2021-12-07: 11:00:00 20 mg via ORAL
  Filled 2021-12-07: qty 1

## 2021-12-07 NOTE — Discharge Summary (Signed)
Physician Discharge Summary  Victor Waters:[id number] DOB: Aug 07, 1926 DOA: 12/01/2021  PCP: Sueanne Margarita, DO  Admit date: 12/01/2021 Discharge date: 12/07/2021  Admitted From: Home  Discharge disposition: Skilled nursing facility   Recommendations for Outpatient Follow-Up:   Follow up with your primary care provider in one week.  Check CBC, BMP, magnesium in the next visit Please consider palliative care involvement at the skilled nursing facility   Discharge Diagnosis:   Principal Problem:   Acute CHF (congestive heart failure) (HCC) Hyponatremia,  hypertension,  hyperlipidemia,  Discharge Condition: Improved.  Diet recommendation: Low sodium, heart healthy.  Fluid restriction 1500 MLS per day.  Wound care: None.  Code status: DNR  History of Present Illness:   Victor Waters is a 87 y.o. male with past medical history of coronary artery disease status post CABG, hyperlipidemia, hypertension, chronic systolic heart failure presented to the hospital with shortness of breath and dyspnea ongoing for the last 2 months.  He was last seen by his primary care physician who told that he had fluid in his lungs and pneumonia and was given some antibiotic and fluid pills.Patient however had increasing shortness of breath for 1 day so he was brought into the hospital.  In the ED, patient was noted to have BNP of 1400.  Sodium was low.  CT chest showed bilateral pleural effusion and atelectasis.  Patient was started on Lasix and hospitalist team was consulted for further evaluation and treatment.    Hospital Course:   Following conditions were addressed during hospitalization as listed below,  Acute on chronic HFrEF Has significantly improved with IV diuresis during hospitalization. 2D echocardiogram from 12/04/2021 showed LV ejection fraction of 20 to 25% with global hypokinesis.  Previous 2D echocardiogram from 2022 showed LV ejection fraction of 45 to 50%.  Worsening  systolic function.  COVID and influenza was negative.  Patient takes oral Lasix at home.  Continue  Lasix and Entresto on discharge..  Negative balance of 2810 ml, approximately 2 kg weight loss documented.  Overall poor prognosis with advanced heart failure, hyponatremia.  Wife interested in hospice at home but unable to help him at home so plan is likely skilled nursing facility at this time with palliative care transition.  Palliative care has also seen the patient during hospitalization.  Mild leg cramps.  Potassium and magnesium within normal limits.  Supportive care.  Diuretics have been changed to oral from today   Mild hypokalemia.  Improved after replacement.  Latest potassium of 4.2.  Patient is on potassium supplements and Entresto as outpatient.   Hyponatremia Not improved.  Thought to be secondary to hypervolemic hyponatremia but has hyponatremia persisting despite diuretics.  Changed to oral diuretic to match his home regimen starting today.  Continue to monitor closely.  Urinary sodium was 92.  TSH within normal limits.  Sodium of 120 from 124<125< 122.  Poor prognosis in the context of heart failure   B/l pleural effusions Secondary to congestive heart failure.  CT scan of the chest showing bilateral pleural effusion with atelectasis.  Changed to oral diuretic at this time.  Appears to be more comfortable at this time.   Normocytic anemia Latest hemoglobin of 11.9, will continue to monitor.   Generalized weakness/Debility, deconditioning. Likely secondary to congestive heart failure exacerbation.  No evidence of infection.  UA was negative.  Procalcitonin less than 0.10.     CAD s/p CABG Continue aspirin   Disposition  At this time patient is stable  for disposition to skilled nursing facility. Would would benefit from palliative care transition/hospice involvement at skilled nursing facility after discharge.   Medical Consultants:   Palliative care  Procedures:     None Subjective:   Today, patient was seen and examined with complains of mild leg cramps.  Denies any nausea vomiting shortness of breath cough fever chills.  Discharge Exam:   Vitals:   12/07/21 0525 12/07/21 0953  BP: 110/62 131/86  Pulse: 81 94  Resp: 18 18  Temp: 97.8 F (36.6 C) 97.8 F (36.6 C)  SpO2: 96% 100%   Vitals:   12/06/21 2041 12/07/21 0525 12/07/21 0530 12/07/21 0953  BP: 110/67 110/62  131/86  Pulse: 87 81  94  Resp: 18 18  18   Temp: 98 F (36.7 C) 97.8 F (36.6 C)  97.8 F (36.6 C)  TempSrc: Oral Oral    SpO2: 97% 96%  100%  Weight:   62.9 kg   Height:        General:  Average built, not in obvious distress, mildly hard of hearing, on room air HENT:   No scleral pallor or icterus noted. Oral mucosa is moist.  Chest:   Diminished breath sound bilaterally.  No wheezes or crackles noted. CVS: S1 &S2 heard. No murmur.  Regular rate and rhythm. Abdomen: Soft, nontender, nondistended.  Bowel sounds are heard.   Extremities: No cyanosis, clubbing with no edema, peripheral pulses are palpable. Psych: Alert, awake and oriented, normal mood CNS:  No cranial nerve deficits.  Power equal in all extremities.   Skin: Warm and dry.  No rashes noted.   The results of significant diagnostics from this hospitalization (including imaging, microbiology, ancillary and laboratory) are listed below for reference.     Diagnostic Studies:   CT HEAD WO CONTRAST (5MM)  Result Date: 12/01/2021 CLINICAL DATA:  Mental status change of unknown cause Increasing confusion EXAM: CT HEAD WITHOUT CONTRAST TECHNIQUE: Contiguous axial images were obtained from the base of the skull through the vertex without intravenous contrast. COMPARISON:  01/20/2019 FINDINGS: Brain: No evidence of acute infarction, hemorrhage, hydrocephalus, extra-axial collection or mass lesion/mass effect. Periventricular white matter hypodensity is a nonspecific finding, but most commonly relates to chronic  ischemic small vessel disease. Diffuse cerebral atrophy again seen. Vascular: No hyperdense vessel or unexpected calcification. Skull: Normal. Negative for fracture or focal lesion. Sinuses/Orbits: Partial opacification of the right posterior ethmoid air cells. Visualized paranasal sinuses and mastoid air cells otherwise well aerated. Other: None. IMPRESSION: No acute intracranial abnormality. Electronically Signed   By: Miachel Roux M.D.   On: 12/01/2021 08:41   CT Angio Chest PE W and/or Wo Contrast  Result Date: 12/01/2021 CLINICAL DATA:  Abnormal chest x-ray EXAM: CT ANGIOGRAPHY CHEST WITH CONTRAST TECHNIQUE: Multidetector CT imaging of the chest was performed using the standard protocol during bolus administration of intravenous contrast. Multiplanar CT image reconstructions and MIPs were obtained to evaluate the vascular anatomy. CONTRAST:  5mL OMNIPAQUE IOHEXOL 350 MG/ML SOLN COMPARISON:  Previous studies including the chest radiograph done on 12/01/2021 FINDINGS: Cardiovascular: Heart is enlarged in size. There is reflux of contrast into hepatic veins. Coronary artery calcifications are seen. There is evidence of previous coronary bypass surgery. There are no intraluminal filling defects in the major pulmonary artery branches. Evaluation of small subsegmental peripheral branches in the lower lung fields is limited by infiltrates. Contrast density in thoracic aorta is less than adequate to evaluate the lumen. Mediastinum/Nodes: No significant lymphadenopathy seen in the mediastinum.  Right hilar prominence seen in the chest radiograph most likely is due to prominent central pulmonary arteries. Lungs/Pleura: Moderate bilateral pleural effusions are seen. Infiltrates seen in both lower lobes there is 14 mm smooth marginated lesion in the medial left upper lung fields, most likely fluid in the interlobar fissure. Upper Abdomen: Arterial calcifications are seen. Musculoskeletal: There is proximally 40%  decrease in height of upper endplate of body of U82 vertebra which was not seen in the previous CT done in 2007. Review of the MIP images confirms the above findings. IMPRESSION: There is no evidence of pulmonary artery embolism. Moderate bilateral pleural effusions, possibly related to CHF. Infiltrates are seen in the both lower lung fields suggesting compression atelectasis or pneumonia. There is approximately 40% decrease in height of upper endplate of body of C00 vertebra which may be recent or old. There is reflux of contrast into hepatic veins suggesting tricuspid incompetence. Coronary artery calcifications are seen. There is evidence of previous coronary bypass surgery. Electronically Signed   By: Elmer Picker M.D.   On: 12/01/2021 08:58   DG Chest Port 1 View  Result Date: 12/01/2021 CLINICAL DATA:  edema EXAM: PORTABLE CHEST 1 VIEW COMPARISON:  January 20, 2019. FINDINGS: Mild enlargement of the cardiopericardial silhouette. CABG and median sternotomy. The calcific atherosclerosis of the aorta. Small bilateral pleural effusions. No visible pneumothorax. Right-sided skin fold. New masslike right hilar opacity. Mild interstitial opacities, including at the lateral lung bases. Streaky retrocardiac opacities. IMPRESSION: 1. New masslike right hilar opacity. Recommend CT chest (preferably with contrast) to exclude a mass/adenopathy. The 2. Mild cardiomegaly with small bilateral pleural effusions and possible mild interstitial edema. 3. Streaky retrocardiac opacities, favor atelectasis. Infection is not excluded. Electronically Signed   By: Margaretha Sheffield M.D.   On: 12/01/2021 07:22     Labs:   Basic Metabolic Panel: Recent Labs  Lab 12/02/21 0830 12/03/21 0439 12/04/21 0445 12/05/21 0541 12/07/21 0428  NA 125* 122* 125* 124* 120*  K 3.3* 3.8 3.7 3.8 4.2  CL 87* 85* 86* 87* 87*  CO2 27 24 25 27 24   GLUCOSE 114* 108* 98 92 103*  BUN 17 23 30* 36* 37*  CREATININE 0.95 1.00 0.98  1.12 0.79  CALCIUM 8.8* 9.0 8.8* 8.7* 8.5*  MG  --  1.9 1.9 2.0 2.2  PHOS 3.5 3.2 3.7  --   --    GFR Estimated Creatinine Clearance: 48.3 mL/min (by C-G formula based on SCr of 0.79 mg/dL). Liver Function Tests: Recent Labs  Lab 12/01/21 0722 12/02/21 0830  AST 25  --   ALT 18  --   ALKPHOS 47  --   BILITOT 0.7  --   PROT 5.8*  --   ALBUMIN 3.5 3.6   No results for input(s): LIPASE, AMYLASE in the last 168 hours. Recent Labs  Lab 12/01/21 0722  AMMONIA <10   Coagulation profile No results for input(s): INR, PROTIME in the last 168 hours.  CBC: Recent Labs  Lab 12/01/21 0722 12/01/21 2231 12/02/21 0500 12/03/21 0439 12/04/21 0445 12/05/21 0541 12/07/21 0428  WBC 3.9*   < > 5.6 6.8 5.1 4.3 3.0*  NEUTROABS 2.8  --   --   --   --   --   --   HGB 12.4*   < > 13.3 13.1 12.7* 12.0* 11.9*  HCT 35.5*   < > 38.4* 38.2* 35.8* 34.4* 33.6*  MCV 87.4   < > 86.9 88.4 84.8 85.6 84.8  PLT 145*   < >  154 148* 139* 139* 143*   < > = values in this interval not displayed.   Cardiac Enzymes: No results for input(s): CKTOTAL, CKMB, CKMBINDEX, TROPONINI in the last 168 hours. BNP: Invalid input(s): POCBNP CBG: No results for input(s): GLUCAP in the last 168 hours. D-Dimer No results for input(s): DDIMER in the last 72 hours. Hgb A1c No results for input(s): HGBA1C in the last 72 hours. Lipid Profile No results for input(s): CHOL, HDL, LDLCALC, TRIG, CHOLHDL, LDLDIRECT in the last 72 hours. Thyroid function studies No results for input(s): TSH, T4TOTAL, T3FREE, THYROIDAB in the last 72 hours.  Invalid input(s): FREET3 Anemia work up No results for input(s): VITAMINB12, FOLATE, FERRITIN, TIBC, IRON, RETICCTPCT in the last 72 hours. Microbiology Recent Results (from the past 240 hour(s))  Resp Panel by RT-PCR (Flu A&B, Covid) Nasopharyngeal Swab     Status: None   Collection Time: 12/01/21  9:37 AM   Specimen: Nasopharyngeal Swab; Nasopharyngeal(NP) swabs in vial transport  medium  Result Value Ref Range Status   SARS Coronavirus 2 by RT PCR NEGATIVE NEGATIVE Final    Comment: (NOTE) SARS-CoV-2 target nucleic acids are NOT DETECTED.  The SARS-CoV-2 RNA is generally detectable in upper respiratory specimens during the acute phase of infection. The lowest concentration of SARS-CoV-2 viral copies this assay can detect is 138 copies/mL. A negative result does not preclude SARS-Cov-2 infection and should not be used as the sole basis for treatment or other patient management decisions. A negative result may occur with  improper specimen collection/handling, submission of specimen other than nasopharyngeal swab, presence of viral mutation(s) within the areas targeted by this assay, and inadequate number of viral copies(<138 copies/mL). A negative result must be combined with clinical observations, patient history, and epidemiological information. The expected result is Negative.  Fact Sheet for Patients:  EntrepreneurPulse.com.au  Fact Sheet for Healthcare Providers:  IncredibleEmployment.be  This test is no t yet approved or cleared by the [place] FDA and  has been authorized for detection and/or diagnosis of SARS-CoV-2 by FDA under an Emergency Use Authorization (EUA). This EUA will remain  in effect (meaning this test can be used) for the duration of the COVID-19 declaration under Section 564(b)(1) of the Act, 21 U.S.C.section 360bbb-3(b)(1), unless the authorization is terminated  or revoked sooner.       Influenza A by PCR NEGATIVE NEGATIVE Final   Influenza B by PCR NEGATIVE NEGATIVE Final    Comment: (NOTE) The Xpert Xpress SARS-CoV-2/FLU/RSV plus assay is intended as an aid in the diagnosis of influenza from Nasopharyngeal swab specimens and should not be used as a sole basis for treatment. Nasal washings and aspirates are unacceptable for Xpert Xpress SARS-CoV-2/FLU/RSV testing.  Fact Sheet for  Patients: EntrepreneurPulse.com.au  Fact Sheet for Healthcare Providers: IncredibleEmployment.be  This test is not yet approved or cleared by the [place] FDA and has been authorized for detection and/or diagnosis of SARS-CoV-2 by FDA under an Emergency Use Authorization (EUA). This EUA will remain in effect (meaning this test can be used) for the duration of the COVID-19 declaration under Section 564(b)(1) of the Act, 21 U.S.C. section 360bbb-3(b)(1), unless the authorization is terminated or revoked.  Performed at [facility], [place] [address] [place], [place] [address]      Discharge Instructions:   Discharge Instructions     Avoid straining   Complete by: As directed    Diet - low sodium heart healthy   Complete by: As directed  Discharge instructions   Complete by: As directed    Follow-up with your primary care provider at the skilled nursing facility in 3 to 5 days.  Check blood work at that time.  Consider palliative care/hospice at the facility   Heart Failure patients record your daily weight using the same scale at the same time of day   Complete by: As directed    Increase activity slowly   Complete by: As directed    STOP any activity that causes chest pain, shortness of breath, dizziness, sweating, or exessive weakness   Complete by: As directed       Allergies as of 12/07/2021       Reactions   Lisinopril Cough   Renal artery stenosis by history; ACE inhibitor  would be relatively contraindicated   Zithromax [azithromycin] Nausea And Vomiting   02/15/14   Niacin Other (See Comments)   Burning sensations in head   Simvastatin Other (See Comments)   Unknown reaction per [facility]   Terazosin Other (See Comments)   Caused hypotension per [facility]        Medication List     STOP taking these medications    levofloxacin 500 MG tablet Commonly known as: LEVAQUIN       TAKE these medications     albuterol 108 (90 Base) MCG/ACT inhaler Commonly known as: VENTOLIN HFA Inhale 2 puffs into the lungs every 4 (four) hours as needed.   aspirin 81 MG tablet Take 81 mg by mouth at bedtime.   B-Complex/B-12 Liqd Take 1 tbsp by mouth daily   Blood Pressure Cuff Misc 1 each by Does not apply route daily as needed.   CALCIUM-MAGNESIUM-ZINC PO Take 1 tablet by mouth daily.   calcium-vitamin D 500-200 MG-UNIT Tabs tablet Commonly known as: OSCAL WITH D Take by mouth.   Entresto 49-51 MG Generic drug: sacubitril-valsartan TAKE 1 TABLET BY MOUTH TWICE DAILY What changed: how to take this   Fish Oil 1200 MG Cpdr Take 1 capsule by mouth daily.   furosemide 20 MG tablet Commonly known as: LASIX Take 20 mg by mouth daily.   Melatonin 3 MG Tbdp Take 1 tablet by mouth at bedtime as needed (sleep). Reported on 04/29/2016   nitroGLYCERIN 0.4 MG SL tablet Commonly known as: NITROSTAT DISSOLVE 1 TABLET UNDER THE TONGUE AS NEEDED FOR CHEST PAIN. MAY REPEAT IN 5 MINUTES AS DIRECTED. ER IF NO BETTER.   Potassium 99 MG Tabs Take 1 tablet by mouth daily as needed (for cramps).   VITAMIN C PO Take 1 tablet by mouth daily.   VITAMIN C/NATURAL ROSE HIPS PO Take 1 tablet by mouth daily.   Vitamin D3 1.25 MG (50000 UT) Tabs Take 2,000 Units by mouth once a week. Saturday/Sunday         Time coordinating discharge: 39 minutes  Signed:  Octivia Canion  [facility] 12/07/2021, 2:07 PM

## 2021-12-07 NOTE — TOC Transition Note (Addendum)
Transition of Care [facility]) - CM/SW Discharge Note   Patient Details  Name: Victor Waters MRN: [id number] Date of Birth: 1926/12/19  Transition of Care [facility] CM/SW Contact:  Leeroy Cha, RN Phone Number: 12/07/2021, 2:06 PM   Clinical Narrative:    T[employer] please call [employer] to see if precert required. Tct-[employer] [facility], no precert reuired for this patient. Tct-[facility] pt will go to room110-b call report to [phone] Tct-[facility]-placed request for transport.   Final next level of care: Skilled Nursing Facility Barriers to Discharge: No Barriers Identified   Patient Goals and CMS Choice Patient states their goals for this hospitalization and ongoing recovery are:: not given CMS Medicare.gov Compare Post Acute Care list provided to:: Patient Represenative (must comment) (wife) Choice offered to / list presented to : Spouse  Discharge Placement                       Discharge Plan and Services   Discharge Planning Services: CM Consult Post Acute Care Choice: Skilled Nursing Facility                               Social Determinants of Health (SDOH) Interventions     Readmission Risk Interventions No flowsheet data found.

## 2021-12-07 NOTE — Progress Notes (Signed)
Report given to [facility] at [facility], all questions answered. Wife made aware of DC plans. Also made aware that [facility] does not allow for passengers to ride. Will prepare patient for DC.

## 2021-12-07 NOTE — Progress Notes (Signed)
Daily Progress Note   Patient Name: Victor Waters       Date: 12/07/2021 DOB: 10/22/1926  Age: 87 y.o. MRN#: [id number] Attending Physician: Flora Lipps, MD Primary Care Physician: Sueanne Margarita, DO Admit Date: 12/01/2021  Reason for Consultation/Follow-up: Establishing goals of care  Subjective: I saw and examined Victor Waters.  He was sitting in bed eating dinner in no acute distress at time of my encounter.  No family at the bedside.  We reviewed plan to go to [facility] burn for trial of rehab.  We also discussed outpatient palliative care following him in order to continue conversation based upon his clinical course at skilled facility.  Length of Stay: 6  Current Medications: Scheduled Meds:   aspirin EC  81 mg Oral QHS   calcium-vitamin D  1 tablet Oral Daily   enoxaparin (LOVENOX) injection  40 mg Subcutaneous Q24H   furosemide  20 mg Oral Daily   potassium chloride  20 mEq Oral Daily   sacubitril-valsartan  1 tablet Oral BID    Continuous Infusions:   PRN Meds: acetaminophen **OR** acetaminophen, albuterol, ondansetron (ZOFRAN) IV  Physical Exam     General: Alert, awake, in no acute distress.  HEENT: No bruits, no goiter, no JVD Heart: Regular rate and rhythm. No murmur appreciated. Lungs: Decreased with scattered crackles Abdomen: Soft, nontender, nondistended, positive bowel sounds.   Ext: No significant edema Skin: Warm and dry  Vital Signs: BP (!) 122/94 (BP Location: Left Arm)    Pulse 94    Temp 98.2 F (36.8 C) (Oral)    Resp 18    Ht 5\' 4"  (1.626 m)    Wt 62.9 kg    SpO2 96%    BMI 23.80 kg/m  SpO2: SpO2: 96 % O2 Device: O2 Device: Room Air O2 Flow Rate:    Intake/output summary:  Intake/Output Summary (Last 24 hours) at 12/07/2021 2054 Last data  filed at 12/07/2021 2025 Gross per 24 hour  Intake 480 ml  Output 400 ml  Net 80 ml   LBM: Last BM Date: 12/06/21 Baseline Weight: Weight: 65.3 kg Most recent weight: Weight: 62.9 kg       Palliative Assessment/Data:    Flowsheet Rows    Flowsheet Row Most Recent Value  Intake Tab   Referral Department Hospitalist  Unit  at Time of Referral ER  Palliative Care Primary Diagnosis Cardiac  Date Notified 12/02/21  Palliative Care Type New Palliative care  Reason for referral Clarify Goals of Care  Date of Admission 12/01/21  Date first seen by Palliative Care 12/02/21  # of days Palliative referral response time 0 Day(s)  # of days IP prior to Palliative referral 1  Clinical Assessment   Palliative Performance Scale Score 30%  Psychosocial & Spiritual Assessment   Palliative Care Outcomes        Patient Active Problem List   Diagnosis Date Noted   Acute CHF (congestive heart failure) ([place]) 12/01/2021   Murmur 02/26/2020   Educated about COVID-19 virus infection 02/24/2020   Pain due to onychomycosis of toenails of both feet 01/21/2020   Coronary artery disease involving native coronary artery of native heart without angina pectoris 07/10/2019   Near syncope 01/20/2019   Prostate cancer ([place]) 01/20/2019   Hyperglycemia 01/20/2019   TIA (transient ischemic attack) 04/04/2017   Facial droop 04/04/2017   Localized swelling of lower extremity 04/04/2017   BPH (benign prostatic hyperplasia) 04/04/2017   Stroke-like symptoms 04/04/2017   Syncope and collapse 64/33/2951   Chronic systolic heart failure (HCC)    Cerebral infarction, remote, resolved    Mild dementia 08/10/2016   Sleep disturbance 01/06/2016   Ventricular ectopy 12/31/2014   Acute confusional state 12/31/2014   Palpitations    Nocturia 09/20/2013   Hyponatremia 09/29/2012   Fatigue 05/09/2012   PERSONAL HISTORY OTHER DISORDER URINARY SYSTEM 12/22/2010   Thrombocytopenia ([place]) 08/04/2010    LEUKOCYTOPENIA UNSPECIFIED 08/04/2010   TINNITUS, CHRONIC 07/06/2010   RENAL ARTERY STENOSIS 11/25/2009   RENAL INSUFFICIENCY 11/25/2009   DYSPNEA 09/24/2009   BRADYCARDIA 09/04/2009   HYPOTENSION-ORTHOSTATIC 09/04/2009   DIZZINESS 09/04/2009   HYPERLIPIDEMIA 01/31/2008   DEPRESSIVE DISORDER 01/23/2008   Essential hypertension 01/23/2008   ANXIETY 06/02/2007   Coronary atherosclerosis 06/02/2007   Insomnia 06/02/2007    Palliative Care Assessment & Plan   Patient Profile: 87 y.o. male  with past medical history of CAD status post CABG, HLD, HTN, chronic systolic heart failure admitted on 12/01/2021 with shortness of breath.  Noted to have dyspnea for the last couple of months "fluid on his lungs" and pneumonia.  Attempted diuresis at home and given antibiotics.  He had had return of his symptoms and was started on further diuretics.  He did not improve and caregiving needs escalated so his wife brought to the ED.   Recommendations/Plan: Plan for trial of rehab at [facility]. Recommend outpatient palliative care to follow at SNF.  Code Status:    Code Status Orders  (From admission, onward)           Start     Ordered   12/01/21 1235  Do not attempt resuscitation (DNR)  Continuous       Question Answer Comment  In the event of cardiac or respiratory ARREST Do not call a code blue   In the event of cardiac or respiratory ARREST Do not perform Intubation, CPR, defibrillation or ACLS   In the event of cardiac or respiratory ARREST Use medication by any route, position, wound care, and other measures to relive pain and suffering. May use oxygen, suction and manual treatment of airway obstruction as needed for comfort.   Comments Confirmed w/ wife at bedside      12/01/21 1234           Code Status History     Date Active  Date Inactive Code Status Order ID Comments User Context   01/20/2019 1213 01/23/2019 1435 DNR [id number]  Karmen Bongo, MD ED   04/04/2017 1520  04/05/2017 2122 Full Code [id number]  Waldemar Dickens, MD Inpatient   08/31/2016 1309 09/01/2016 1942 Full Code [id number]  Smiley Houseman, MD Inpatient   12/31/2014 0319 12/31/2014 1907 Full Code [id number]  Cletus Gash, MD Inpatient   12/23/2014 1623 12/25/2014 1514 Full Code [id number]  Theodis Blaze, MD Inpatient      Advance Directive Documentation    Flowsheet Row Most Recent Value  Type of Advance Directive Healthcare Power of Attorney, Living will  Pre-existing out of facility DNR order (yellow form or pink MOST form) --  "MOST" Form in Place? --       Prognosis:  Unable to determine  Discharge Planning: [place] for rehab with Palliative care service follow-up  Care plan was discussed with patient  Thank you for allowing the Palliative Medicine Team to assist in the care of this patient.   Time In: 2376 Time Out: 1810 Total Time 15 Prolonged Time Billed No      Greater than 50%  of this time was spent counseling and coordinating care related to the above assessment and plan.  Micheline Rough, MD  Please contact Palliative Medicine Team phone at [phone] for questions and concerns.

## 2021-12-07 NOTE — Progress Notes (Signed)
PROGRESS NOTE  Victor Waters Drew YOV:[id number] DOB: [birth year] DOA: 12/01/2021 PCP: Victor Margarita, DO   LOS: 6 days   Brief narrative:  Victor Waters is a 87 y.o. male with past medical history of coronary artery disease status post CABG, hyperlipidemia, hypertension, chronic systolic heart failure presented to the hospital with shortness of breath and dyspnea ongoing for the last 2 months.  He was last seen by his primary care physician who told that he had fluid in his lungs and pneumonia and was given some antibiotic and fluid pills.Patient however had increasing shortness of breath for 1 day so he was brought into the hospital.  In the ED, patient was noted to have BNP of 1400.  Sodium was low.  CT chest showed bilateral pleural effusion and atelectasis.  Patient was started on Lasix and hospitalist team was consulted for further evaluation and treatment.   During hospitalization, patient has been seen by palliative care. Left ventricular EF has worsened compared to prior.  Patient's wife is okay with hospice level of care but wishes him to be more independent prior to coming home so skilled nursing facility is likely the plan for disposition with possible transition to hospice from skilled nursing facility.  Assessment/Plan:  Principal Problem:   Acute CHF (congestive heart failure) (HCC)  Acute on chronic HFrEF Continue CHF protocol.  Strict intake and output charting.  Daily weights. 2D echocardiogram from 12/04/2021 showed LV ejection fraction of 20 to 25% with global hypokinesis.  Previous 2D echocardiogram from 2022 showed LV ejection fraction of 45 to 50%.  Worsening systolic function.  COVID and influenza was negative.  Patient takes oral Lasix at home.  Continue IV Lasix and Entresto.  Negative balance of 2810 ml, approximately 2 kg weight loss documented.  Overall poor prognosis with advanced heart failure, hyponatremia.  Wife interested in hospice but wishes to have him come home  but he is debilitated and weak and she is unable to help him so plan is likely skilled nursing facility at this time with hospice transition as outpatient.  Palliative care has also seen the patient during hospitalization.  Mild hypokalemia.  Improved after replacement.  Latest potassium of 4.2.  Watch closely on potassium supplements and Entresto.  Hyponatremia Not improved.  Thought to be secondary to hypervolemic hyponatremia but has hyponatremia despite diuretics.  Changed to oral diuretic to match his home regimen starting today.  Continue to monitor closely.  Urinary sodium was 92.  TSH within normal limits.  Sodium of 120 from 124<125< 122.  Poor prognosis in the context of heart failure  B/l pleural effusions Secondary to congestive heart failure.  CT scan of the chest showing bilateral pleural effusion with atelectasis.  Change to oral diuretic at this time. Continue incentive spirometry.  Appears to be more comfortable at this time.  Normocytic anemia Latest hemoglobin of 11.9, will continue to monitor.   Generalized weakness/Debility, deconditioning. Likely secondary to congestive heart failure exacerbation.  No evidence of infection.  UA was negative.  Procalcitonin less than 0.10.  TOC on board for disposition plan.  CAD s/p CABG Continue aspirin  Disposition  Likely  to nursing facility.  Palliative care on board.  Patient's wife is interested in [place]. Would would benefit from palliative care transition/hospice involvement at skilled nursing facility after discharge.  DVT prophylaxis: enoxaparin (LOVENOX) injection 40 mg Start: 12/02/21 2200  Code Status: DNR.   Family Communication:  I again spoke with the patient's wife at  bedside.  Status is: Inpatient  Remains inpatient appropriate because: Decompensated heart failure, hyponatremia, awaiting for skilled nursing facility/hospice  Consultants: Palliative  care  Procedures: None  Anti-infectives:  None  Anti-infectives (From admission, onward)    None      Subjective: Today, patient was seen and examined at bedside.  Patient sitting up in bed.  Complains of leg cramps.  Denies shortness of breath, nausea vomiting fever or chills.  Objective: Vitals:   12/06/21 2041 12/07/21 0525  BP: 110/67 110/62  Pulse: 87 81  Resp: 18 18  Temp: 98 F (36.7 C) 97.8 F (36.6 C)  SpO2: 97% 96%    Intake/Output Summary (Last 24 hours) at 12/07/2021 0739 Last data filed at 12/07/2021 0530 Gross per 24 hour  Intake 1080 ml  Output 400 ml  Net 680 ml    Filed Weights   12/05/21 0527 12/06/21 0525 12/07/21 0530  Weight: 63.9 kg 63.5 kg 62.9 kg   Body mass index is 23.8 kg/m.   Physical Exam:  General:  Average built, not in obvious distress, mildly hard of hearing, on room air HENT:   No scleral pallor or icterus noted. Oral mucosa is moist.  Chest:   Diminished breath sound bilaterally.  No wheezes or crackles noted. CVS: S1 &S2 heard. No murmur.  Regular rate and rhythm. Abdomen: Soft, nontender, nondistended.  Bowel sounds are heard.   Extremities: No cyanosis, clubbing with no edema, peripheral pulses are palpable. Psych: Alert, awake and oriented, normal mood CNS:  No cranial nerve deficits.  Power equal in all extremities.   Skin: Warm and dry.  No rashes noted.   Data Review: I have personally reviewed the following laboratory data and studies,  CBC: Recent Labs  Lab 12/01/21 0722 12/01/21 2231 12/02/21 0500 12/03/21 0439 12/04/21 0445 12/05/21 0541 12/07/21 0428  WBC 3.9*   < > 5.6 6.8 5.1 4.3 3.0*  NEUTROABS 2.8  --   --   --   --   --   --   HGB 12.4*   < > 13.3 13.1 12.7* 12.0* 11.9*  HCT 35.5*   < > 38.4* 38.2* 35.8* 34.4* 33.6*  MCV 87.4   < > 86.9 88.4 84.8 85.6 84.8  PLT 145*   < > 154 148* 139* 139* 143*   < > = values in this interval not displayed.    Basic Metabolic Panel: Recent Labs  Lab  12/02/21 0830 12/03/21 0439 12/04/21 0445 12/05/21 0541 12/07/21 0428  NA 125* 122* 125* 124* 120*  K 3.3* 3.8 3.7 3.8 4.2  CL 87* 85* 86* 87* 87*  CO2 27 24 25 27 24   GLUCOSE 114* 108* 98 92 103*  BUN 17 23 30* 36* 37*  CREATININE 0.95 1.00 0.98 1.12 0.79  CALCIUM 8.8* 9.0 8.8* 8.7* 8.5*  MG  --  1.9 1.9 2.0 2.2  PHOS 3.5 3.2 3.7  --   --     Liver Function Tests: Recent Labs  Lab 12/01/21 0722 12/02/21 0830  AST 25  --   ALT 18  --   ALKPHOS 47  --   BILITOT 0.7  --   PROT 5.8*  --   ALBUMIN 3.5 3.6    No results for input(s): LIPASE, AMYLASE in the last 168 hours. Recent Labs  Lab 12/01/21 0722  AMMONIA <10    Cardiac Enzymes: No results for input(s): CKTOTAL, CKMB, CKMBINDEX, TROPONINI in the last 168 hours. BNP (last 3 results) Recent Labs  12/01/21 0722 12/03/21 1245  BNP 1,433.5* 2,022.0*     ProBNP (last 3 results) No results for input(s): PROBNP in the last 8760 hours.  CBG: No results for input(s): GLUCAP in the last 168 hours. Recent Results (from the past 240 hour(s))  Resp Panel by RT-PCR (Flu A&B, Covid) Nasopharyngeal Swab     Status: None   Collection Time: 12/01/21  9:37 AM   Specimen: Nasopharyngeal Swab; Nasopharyngeal(NP) swabs in vial transport medium  Result Value Ref Range Status   SARS Coronavirus 2 by RT PCR NEGATIVE NEGATIVE Final    Comment: (NOTE) SARS-CoV-2 target nucleic acids are NOT DETECTED.  The SARS-CoV-2 RNA is generally detectable in upper respiratory specimens during the acute phase of infection. The lowest concentration of SARS-CoV-2 viral copies this assay can detect is 138 copies/mL. A negative result does not preclude SARS-Cov-2 infection and should not be used as the sole basis for treatment or other patient management decisions. A negative result may occur with  improper specimen collection/handling, submission of specimen other than nasopharyngeal swab, presence of viral mutation(s) within the areas  targeted by this assay, and inadequate number of viral copies(<138 copies/mL). A negative result must be combined with clinical observations, patient history, and epidemiological information. The expected result is Negative.  Fact Sheet for Patients:  EntrepreneurPulse.com.au  Fact Sheet for Healthcare Providers:  IncredibleEmployment.be  This test is no t yet approved or cleared by the [place] FDA and  has been authorized for detection and/or diagnosis of SARS-CoV-2 by FDA under an Emergency Use Authorization (EUA). This EUA will remain  in effect (meaning this test can be used) for the duration of the COVID-19 declaration under Section 564(b)(1) of the Act, 21 U.S.C.section 360bbb-3(b)(1), unless the authorization is terminated  or revoked sooner.       Influenza A by PCR NEGATIVE NEGATIVE Final   Influenza B by PCR NEGATIVE NEGATIVE Final    Comment: (NOTE) The Xpert Xpress SARS-CoV-2/FLU/RSV plus assay is intended as an aid in the diagnosis of influenza from Nasopharyngeal swab specimens and should not be used as a sole basis for treatment. Nasal washings and aspirates are unacceptable for Xpert Xpress SARS-CoV-2/FLU/RSV testing.  Fact Sheet for Patients: EntrepreneurPulse.com.au  Fact Sheet for Healthcare Providers: IncredibleEmployment.be  This test is not yet approved or cleared by the [place] FDA and has been authorized for detection and/or diagnosis of SARS-CoV-2 by FDA under an Emergency Use Authorization (EUA). This EUA will remain in effect (meaning this test can be used) for the duration of the COVID-19 declaration under Section 564(b)(1) of the Act, 21 U.S.C. section 360bbb-3(b)(1), unless the authorization is terminated or revoked.  Performed at [facility], [place] [address] [place], [place] [address]       Studies: No results found.    Flora Lipps, MD  [facility] 12/07/2021  If 7PM-7AM, please contact night-coverage

## 2021-12-07 NOTE — TOC Progression Note (Signed)
Transition of Care [facility]) - Progression Note    Patient Details  Name: Victor Waters MRN: [id number] Date of Birth: [birth year]  Transition of [facility] CM/SW Contact  Leeroy Cha, RN Phone Number: 12/07/2021, 12:19 PM  Clinical Narrative:    [facility] at [facility] working on a possible bed for this patient now will call back by 1330.  Will await call back.        Expected Discharge Plan and Services                                                 Social Determinants of Health (SDOH) Interventions    Readmission Risk Interventions No flowsheet data found.

## 2021-12-07 NOTE — Plan of Care (Signed)
°  Problem: Clinical Measurements: °Goal: Respiratory complications will improve °Outcome: Progressing °  °Problem: Pain Managment: °Goal: General experience of comfort will improve °Outcome: Progressing °  °Problem: Safety: °Goal: Ability to remain free from injury will improve °Outcome: Progressing °  °

## 2021-12-09 DIAGNOSIS — I5084 End stage heart failure: Secondary | ICD-10-CM | POA: Diagnosis not present

## 2021-12-09 DIAGNOSIS — Z7409 Other reduced mobility: Secondary | ICD-10-CM | POA: Diagnosis not present

## 2021-12-09 DIAGNOSIS — R531 Weakness: Secondary | ICD-10-CM | POA: Diagnosis not present

## 2021-12-09 DIAGNOSIS — I2510 Atherosclerotic heart disease of native coronary artery without angina pectoris: Secondary | ICD-10-CM | POA: Diagnosis not present

## 2021-12-09 DIAGNOSIS — R296 Repeated falls: Secondary | ICD-10-CM | POA: Diagnosis not present

## 2021-12-09 DIAGNOSIS — E871 Hypo-osmolality and hyponatremia: Secondary | ICD-10-CM | POA: Diagnosis not present

## 2021-12-10 ENCOUNTER — Emergency Department (HOSPITAL_COMMUNITY): Payer: Medicare HMO

## 2021-12-10 ENCOUNTER — Other Ambulatory Visit: Payer: Self-pay

## 2021-12-10 ENCOUNTER — Emergency Department (HOSPITAL_COMMUNITY)
Admission: EM | Admit: 2021-12-10 | Discharge: 2021-12-11 | Disposition: A | Discharge status: Home / Self Care | Payer: Medicare HMO | Attending: Emergency Medicine | Admitting: Emergency Medicine

## 2021-12-10 DIAGNOSIS — R102 Pelvic and perineal pain: Secondary | ICD-10-CM | POA: Diagnosis not present

## 2021-12-10 DIAGNOSIS — I5022 Chronic systolic (congestive) heart failure: Secondary | ICD-10-CM | POA: Insufficient documentation

## 2021-12-10 DIAGNOSIS — Z8546 Personal history of malignant neoplasm of prostate: Secondary | ICD-10-CM | POA: Diagnosis not present

## 2021-12-10 DIAGNOSIS — Z7982 Long term (current) use of aspirin: Secondary | ICD-10-CM | POA: Insufficient documentation

## 2021-12-10 DIAGNOSIS — S0003XA Contusion of scalp, initial encounter: Secondary | ICD-10-CM | POA: Diagnosis not present

## 2021-12-10 DIAGNOSIS — I110 Hypertensive heart disease with heart failure: Secondary | ICD-10-CM | POA: Insufficient documentation

## 2021-12-10 DIAGNOSIS — Z955 Presence of coronary angioplasty implant and graft: Secondary | ICD-10-CM | POA: Insufficient documentation

## 2021-12-10 DIAGNOSIS — S0990XA Unspecified injury of head, initial encounter: Secondary | ICD-10-CM | POA: Diagnosis not present

## 2021-12-10 DIAGNOSIS — Z79899 Other long term (current) drug therapy: Secondary | ICD-10-CM | POA: Insufficient documentation

## 2021-12-10 DIAGNOSIS — S199XXA Unspecified injury of neck, initial encounter: Secondary | ICD-10-CM | POA: Diagnosis not present

## 2021-12-10 DIAGNOSIS — S0081XA Abrasion of other part of head, initial encounter: Secondary | ICD-10-CM | POA: Insufficient documentation

## 2021-12-10 DIAGNOSIS — Z8616 Personal history of COVID-19: Secondary | ICD-10-CM | POA: Insufficient documentation

## 2021-12-10 DIAGNOSIS — W19XXXA Unspecified fall, initial encounter: Secondary | ICD-10-CM

## 2021-12-10 DIAGNOSIS — W050XXA Fall from non-moving wheelchair, initial encounter: Secondary | ICD-10-CM | POA: Insufficient documentation

## 2021-12-10 DIAGNOSIS — F0390 Unspecified dementia without behavioral disturbance: Secondary | ICD-10-CM | POA: Diagnosis not present

## 2021-12-10 DIAGNOSIS — I2510 Atherosclerotic heart disease of native coronary artery without angina pectoris: Secondary | ICD-10-CM | POA: Insufficient documentation

## 2021-12-10 DIAGNOSIS — E871 Hypo-osmolality and hyponatremia: Secondary | ICD-10-CM | POA: Diagnosis not present

## 2021-12-10 DIAGNOSIS — M25551 Pain in right hip: Secondary | ICD-10-CM | POA: Diagnosis not present

## 2021-12-10 NOTE — ED Provider Notes (Signed)
[facility] EMERGENCY DEPARTMENT Provider Note   CSN: [id number] Arrival date & time: 12/10/21  1447     History Chief Complaint  Patient presents with   Victor Waters is a 87 y.o. male.  HPI  87 year old male with past medical history of CAD, HTN, HLD, seizure disorder, CHF, previous TIA presents the emergency department with fall and head injury.  Patient is not noted to be on any anticoagulation.  Patient is from a living facility.  Noted to have severe dementia.  The fall was reported as mechanical, no other noted injury by staff.  No other reported recent fever, illness.  Wife saw the patient after the fall and stated that he was in his baseline status.  Past Medical History:  Diagnosis Date   Anxiety    Coronary artery disease    a. s/p CABG;  b. LHC (2/11):  LM 30-40, pLAD 95-99 then 80 and 90, mCFX 90, pRCA occluded, S-dRCA ok, S-D1 ok, S-OM patent with 50, L-LAD ok.  Med Rx recommended.  c.  Myoview (5/13):  Low risk with small fixed inf defect c/w scar, no ischemia, EF 48%.  d.  Echo (6/13):  EF 55-60%, inf HK, Gr 1 DD, MAC;  e. Lexiscan Myoview (10/14):  Low risk, EF 38%, inferobasal infarct, no ischemia.   Hyperlipidemia    Hypertension    Hyponatremia 09/2012   Associated with postural hypotension and confusion   Insomnia    Myocardial infarction [facility]) 2003   hx of    Renal artery stenosis (HCC)    bilateral;  s/p R RA stent 2011   Seizure ([place]) 2015   unknow etiology and year of seizure???   Syncope and collapse 55/73/2202   Systolic heart failure ([place])    CHRONIC   TIA (transient ischemic attack)     Patient Active Problem List   Diagnosis Date Noted   Acute CHF (congestive heart failure) ([place]) 12/01/2021   Murmur 02/26/2020   Educated about COVID-19 virus infection 02/24/2020   Pain due to onychomycosis of toenails of both feet 01/21/2020   Coronary artery disease involving native coronary artery of native heart without  angina pectoris 07/10/2019   Near syncope 01/20/2019   Prostate cancer ([place]) 01/20/2019   Hyperglycemia 01/20/2019   TIA (transient ischemic attack) 04/04/2017   Facial droop 04/04/2017   Localized swelling of lower extremity 04/04/2017   BPH (benign prostatic hyperplasia) 04/04/2017   Stroke-like symptoms 04/04/2017   Syncope and collapse 54/27/0623   Chronic systolic heart failure ([place])    Cerebral infarction, remote, resolved    Mild dementia 08/10/2016   Sleep disturbance 01/06/2016   Ventricular ectopy 12/31/2014   Acute confusional state 12/31/2014   Palpitations    Nocturia 09/20/2013   Hyponatremia 09/29/2012   Fatigue 05/09/2012   PERSONAL HISTORY OTHER DISORDER URINARY SYSTEM 12/22/2010   Thrombocytopenia ([place]) 08/04/2010   LEUKOCYTOPENIA UNSPECIFIED 08/04/2010   TINNITUS, CHRONIC 07/06/2010   RENAL ARTERY STENOSIS 11/25/2009   RENAL INSUFFICIENCY 11/25/2009   DYSPNEA 09/24/2009   BRADYCARDIA 09/04/2009   HYPOTENSION-ORTHOSTATIC 09/04/2009   DIZZINESS 09/04/2009   HYPERLIPIDEMIA 01/31/2008   DEPRESSIVE DISORDER 01/23/2008   Essential hypertension 01/23/2008   ANXIETY 06/02/2007   Coronary atherosclerosis 06/02/2007   Insomnia 06/02/2007    Past Surgical History:  Procedure Laterality Date   CARDIAC CATHETERIZATION  2003   revdeling severe three-vessel disease   CORONARY ARTERY BYPASS GRAFT     LIMA to LAD, SVG to  diagonal, SVG to RCA, and SVG to circumflex   POLYPECTOMY     colon   RENAL ARTERY STENT  01-28-10       Family History  Problem Relation Age of Onset   Diabetes Mother    Stroke Mother    Heart attack Father 42       died   Colon cancer Other        paternal grandfather    Social History   Tobacco Use   Smoking status: Never   Smokeless tobacco: Never  Substance Use Topics   Alcohol use: No   Drug use: No    Home Medications Prior to Admission medications   Medication Sig Start Date End Date Taking? Authorizing Provider   albuterol (VENTOLIN HFA) 108 (90 Base) MCG/ACT inhaler Inhale 2 puffs into the lungs every 4 (four) hours as needed. Patient not taking: Reported on 12/01/2021 11/18/21   [provider]  Ascorbic Acid (VITAMIN C PO) Take 1 tablet by mouth daily.    [provider]  Ascorbic Acid (VITAMIN C/NATURAL ROSE HIPS PO) Take 1 tablet by mouth daily.    [provider]  aspirin 81 MG tablet Take 81 mg by mouth at bedtime.    [provider]  B Complex Vitamins (B-COMPLEX/B-12) LIQD Take 1 tbsp by mouth daily    [provider]  Blood Pressure Monitoring (BLOOD PRESSURE CUFF) MISC 1 each by Does not apply route daily as needed. 09/09/21   Minus Breeding, MD  CALCIUM-MAGNESIUM-ZINC PO Take 1 tablet by mouth daily.    [provider]  calcium-vitamin D (OSCAL WITH D) 500-200 MG-UNIT TABS tablet Take by mouth.    [provider]  Cholecalciferol (VITAMIN D3) 1.25 MG (50000 UT) TABS Take 2,000 Units by mouth once a week. Saturday/Sunday    [provider]  ENTRESTO 49-51 MG TAKE 1 TABLET BY MOUTH TWICE DAILY Patient taking differently: 1 tablet 2 (two) times daily. 08/28/21   Minus Breeding, MD  furosemide (LASIX) 20 MG tablet Take 20 mg by mouth daily. 11/27/21   [provider]  Melatonin 3 MG TBDP Take 1 tablet by mouth at bedtime as needed (sleep). Reported on 04/29/2016    [provider]  nitroGLYCERIN (NITROSTAT) 0.4 MG SL tablet DISSOLVE 1 TABLET UNDER THE TONGUE AS NEEDED FOR CHEST PAIN. MAY REPEAT IN 5 MINUTES AS DIRECTED. ER IF NO BETTER. Patient not taking: Reported on 12/01/2021 08/15/19   Minus Breeding, MD  Omega-3 Fatty Acids (FISH OIL) 1200 MG CPDR Take 1 capsule by mouth daily.    [provider]  Potassium 99 MG TABS Take 1 tablet by mouth daily as needed (for cramps).    [provider]    Allergies    Lisinopril, Zithromax [azithromycin], Niacin, Simvastatin, and Terazosin  Review  of Systems   Review of Systems  Unable to perform ROS: Dementia   Physical Exam Updated Vital Signs BP (!) 140/91    Pulse (!) 101    Temp (!) 97.3 F (36.3 C) (Oral)    Resp 15    SpO2 93%   Physical Exam Vitals and nursing note reviewed.  Constitutional:      General: He is not in acute distress.    Appearance: Normal appearance.  HENT:     Head: Normocephalic.     Mouth/Throat:     Mouth: Mucous membranes are moist.  Eyes:     Pupils: Pupils are equal, round, and reactive to light.  Cardiovascular:     Rate and Rhythm: Normal rate.  Pulmonary:     Effort: Pulmonary effort is normal. No respiratory distress.  Abdominal:     Palpations: Abdomen is soft.     Tenderness: There is no abdominal tenderness.  Musculoskeletal:     Cervical back: No tenderness.  Skin:    General: Skin is warm.     Comments: Abrasion to the mid forehead  Neurological:     Mental Status: He is alert. Mental status is at baseline.    ED Results / Procedures / Treatments   Labs (all labs ordered are listed, but only abnormal results are displayed) Labs Reviewed - No data to display  EKG None  Radiology No results found.  Procedures Procedures   Medications Ordered in ED Medications - No data to display  ED Course  I have reviewed the triage vital signs and the nursing notes.  Pertinent labs & imaging results that were available during my care of the patient were reviewed by me and considered in my medical decision making (see chart for details).    MDM Rules/Calculators/A&P                           87 year old male presents emergency department for reported mechanical fall and head injury.  He has an abrasion to the mid forehead.  This was witnessed by staff, no LOC.  Patient has severe dementia, is baseline after the fall per his wife, Victor Waters who saw the patient at the facility and who I spoke with on the phone.  Possible episode of hypotension at the facility however no evidence  of that here.  We will do x-ray imaging of the chest, pelvis.  No obvious traumatic injury to the extremities.  Plan for CT of the head and neck.  If this is negative most likely plan for DC back to facility.  Wife Victor Waters would like to be updated at the disposition.     Final Clinical Impression(s) / ED Diagnoses Final diagnoses:  None    Rx / DC Orders ED Discharge Orders     None        Lorelle Gibbs, DO 12/10/21 1632

## 2021-12-10 NOTE — ED Triage Notes (Signed)
Pt from [facility] after witnessed fall out of wheelchair this morning. Pt has abrasion to forehead. No blood thinners. Initial bp at facility 70/40. Pt has hx of dementia and is acting his normal per wife. Last bp 127/80 without any fluid admin. Wife currently discussing hospice plan.

## 2021-12-10 NOTE — ED Provider Notes (Signed)
°  Physical Exam  BP 130/69    Pulse 100    Temp (!) 97.3 F (36.3 C) (Oral)    Resp 18    SpO2 94%   Physical Exam Constitutional:      General: He is not in acute distress.    Appearance: Normal appearance.  HENT:     Head: Normocephalic.     Comments: Frontal scalp hematoma    Nose: No congestion or rhinorrhea.  Eyes:     General:        Right eye: No discharge.        Left eye: No discharge.     Extraocular Movements: Extraocular movements intact.     Pupils: Pupils are equal, round, and reactive to light.  Cardiovascular:     Rate and Rhythm: Normal rate and regular rhythm.     Heart sounds: No murmur heard. Pulmonary:     Effort: No respiratory distress.     Breath sounds: No wheezing or rales.  Abdominal:     General: There is no distension.     Tenderness: There is no abdominal tenderness.  Musculoskeletal:        General: Normal range of motion.     Cervical back: Normal range of motion.  Skin:    General: Skin is warm and dry.  Neurological:     Mental Status: He is alert.    ED Course/Procedures   Clinical Course as of 12/10/21 1832  Thu Dec 10, 2021  1647 Mechanical fall, dementia, pending imaging, if normal call wife and dc back to facility [MK]    Clinical Course User Index [MK] Doss Cybulski, Debe Coder, MD    Procedures  MDM  Patient received in handoff.  Patient with dementia with mechanical fall.  Trauma imaging pending at sign out.  Trauma imaging reassuringly negative outside of small scalp hematoma.  Patient's wife updated.  Patient discharged back to his facility.       Teressa Lower, MD 12/10/21 [phone]

## 2021-12-11 ENCOUNTER — Encounter (HOSPITAL_COMMUNITY): Payer: Self-pay | Admitting: Emergency Medicine

## 2021-12-11 NOTE — ED Notes (Signed)
[facility] called for transport at 2am

## 2022-01-05 ENCOUNTER — Ambulatory Visit: Payer: Medicare HMO | Admitting: Cardiology

## 2022-01-20 DEATH — deceased
